# Patient Record
Sex: Female | Born: 1965 | Race: Black or African American | Hispanic: No | Smoking: Former smoker
Health system: Southern US, Community
[De-identification: ages and names within clinical notes are randomized; demographics above are authoritative.]

## PROBLEM LIST (undated history)

## (undated) DIAGNOSIS — A539 Syphilis, unspecified: Secondary | ICD-10-CM

## (undated) DIAGNOSIS — K219 Gastro-esophageal reflux disease without esophagitis: Secondary | ICD-10-CM

## (undated) DIAGNOSIS — E785 Hyperlipidemia, unspecified: Secondary | ICD-10-CM

## (undated) DIAGNOSIS — F419 Anxiety disorder, unspecified: Secondary | ICD-10-CM

## (undated) DIAGNOSIS — F32A Depression, unspecified: Secondary | ICD-10-CM

## (undated) DIAGNOSIS — J449 Chronic obstructive pulmonary disease, unspecified: Secondary | ICD-10-CM

## (undated) DIAGNOSIS — K635 Polyp of colon: Secondary | ICD-10-CM

## (undated) DIAGNOSIS — L0390 Cellulitis, unspecified: Secondary | ICD-10-CM

## (undated) DIAGNOSIS — I890 Lymphedema, not elsewhere classified: Secondary | ICD-10-CM

## (undated) DIAGNOSIS — M722 Plantar fascial fibromatosis: Secondary | ICD-10-CM

## (undated) DIAGNOSIS — D259 Leiomyoma of uterus, unspecified: Secondary | ICD-10-CM

## (undated) DIAGNOSIS — N6029 Fibroadenosis of unspecified breast: Secondary | ICD-10-CM

## (undated) DIAGNOSIS — T7840XA Allergy, unspecified, initial encounter: Secondary | ICD-10-CM

## (undated) DIAGNOSIS — F329 Major depressive disorder, single episode, unspecified: Secondary | ICD-10-CM

## (undated) DIAGNOSIS — R011 Cardiac murmur, unspecified: Secondary | ICD-10-CM

## (undated) DIAGNOSIS — M1990 Unspecified osteoarthritis, unspecified site: Secondary | ICD-10-CM

## (undated) DIAGNOSIS — T4145XA Adverse effect of unspecified anesthetic, initial encounter: Secondary | ICD-10-CM

## (undated) DIAGNOSIS — T8859XA Other complications of anesthesia, initial encounter: Secondary | ICD-10-CM

## (undated) DIAGNOSIS — E039 Hypothyroidism, unspecified: Secondary | ICD-10-CM

## (undated) DIAGNOSIS — I8390 Asymptomatic varicose veins of unspecified lower extremity: Secondary | ICD-10-CM

## (undated) HISTORY — PX: EYE SURGERY: SHX253

## (undated) HISTORY — DX: Lymphedema, not elsewhere classified: I89.0

## (undated) HISTORY — DX: Fibroadenosis of unspecified breast: N60.29

## (undated) HISTORY — DX: Polyp of colon: K63.5

## (undated) HISTORY — DX: Major depressive disorder, single episode, unspecified: F32.9

## (undated) HISTORY — DX: Gastro-esophageal reflux disease without esophagitis: K21.9

## (undated) HISTORY — DX: Syphilis, unspecified: A53.9

## (undated) HISTORY — DX: Leiomyoma of uterus, unspecified: D25.9

## (undated) HISTORY — PX: ENDOMETRIAL ABLATION: SHX621

## (undated) HISTORY — PX: WISDOM TOOTH EXTRACTION: SHX21

## (undated) HISTORY — DX: Asymptomatic varicose veins of unspecified lower extremity: I83.90

## (undated) HISTORY — PX: INCISION AND DRAINAGE ABSCESS / HEMATOMA OF BURSA / KNEE / THIGH: SUR668

## (undated) HISTORY — DX: Allergy, unspecified, initial encounter: T78.40XA

## (undated) HISTORY — DX: Depression, unspecified: F32.A

## (undated) HISTORY — PX: TUBAL LIGATION: SHX77

## (undated) HISTORY — PX: OTHER SURGICAL HISTORY: SHX169

## (undated) HISTORY — PX: NORPLANT REMOVAL: SHX5385

---

## 1997-12-29 ENCOUNTER — Inpatient Hospital Stay (HOSPITAL_COMMUNITY): Admission: EM | Admit: 1997-12-29 | Discharge: 1998-01-01 | Payer: Self-pay | Admitting: Emergency Medicine

## 1997-12-30 ENCOUNTER — Encounter: Payer: Self-pay | Admitting: Emergency Medicine

## 1997-12-31 ENCOUNTER — Encounter: Payer: Self-pay | Admitting: Family Medicine

## 1998-01-02 ENCOUNTER — Encounter: Admission: RE | Admit: 1998-01-02 | Discharge: 1998-01-02 | Payer: Self-pay | Admitting: Family Medicine

## 1998-01-08 ENCOUNTER — Encounter: Admission: RE | Admit: 1998-01-08 | Discharge: 1998-01-08 | Payer: Self-pay | Admitting: Family Medicine

## 1998-01-16 ENCOUNTER — Encounter: Admission: RE | Admit: 1998-01-16 | Discharge: 1998-01-16 | Payer: Self-pay | Admitting: Family Medicine

## 1998-01-24 ENCOUNTER — Encounter: Admission: RE | Admit: 1998-01-24 | Discharge: 1998-01-24 | Payer: Self-pay | Admitting: Family Medicine

## 1998-02-13 ENCOUNTER — Encounter: Admission: RE | Admit: 1998-02-13 | Discharge: 1998-02-13 | Payer: Self-pay | Admitting: Family Medicine

## 1998-02-21 ENCOUNTER — Encounter: Admission: RE | Admit: 1998-02-21 | Discharge: 1998-02-21 | Payer: Self-pay | Admitting: Family Medicine

## 1998-03-06 ENCOUNTER — Encounter: Admission: RE | Admit: 1998-03-06 | Discharge: 1998-03-06 | Payer: Self-pay | Admitting: Family Medicine

## 1998-03-20 ENCOUNTER — Encounter: Admission: RE | Admit: 1998-03-20 | Discharge: 1998-03-20 | Payer: Self-pay | Admitting: Family Medicine

## 1998-05-26 ENCOUNTER — Encounter: Admission: RE | Admit: 1998-05-26 | Discharge: 1998-05-26 | Payer: Self-pay | Admitting: Family Medicine

## 1998-08-05 ENCOUNTER — Encounter: Payer: Self-pay | Admitting: *Deleted

## 1998-08-05 ENCOUNTER — Encounter: Admission: RE | Admit: 1998-08-05 | Discharge: 1998-08-05 | Payer: Self-pay | Admitting: Family Medicine

## 1998-08-05 ENCOUNTER — Ambulatory Visit (HOSPITAL_COMMUNITY): Admission: RE | Admit: 1998-08-05 | Discharge: 1998-08-05 | Payer: Self-pay | Admitting: *Deleted

## 1998-08-07 ENCOUNTER — Encounter: Admission: RE | Admit: 1998-08-07 | Discharge: 1998-08-07 | Payer: Self-pay | Admitting: Family Medicine

## 1998-08-14 ENCOUNTER — Encounter: Admission: RE | Admit: 1998-08-14 | Discharge: 1998-08-14 | Payer: Self-pay | Admitting: Family Medicine

## 1998-09-04 ENCOUNTER — Encounter: Admission: RE | Admit: 1998-09-04 | Discharge: 1998-09-04 | Payer: Self-pay | Admitting: Sports Medicine

## 1998-09-07 ENCOUNTER — Encounter: Payer: Self-pay | Admitting: Emergency Medicine

## 1998-09-07 ENCOUNTER — Inpatient Hospital Stay (HOSPITAL_COMMUNITY): Admission: EM | Admit: 1998-09-07 | Discharge: 1998-09-13 | Payer: Self-pay | Admitting: Emergency Medicine

## 1998-09-09 ENCOUNTER — Encounter: Payer: Self-pay | Admitting: Family Medicine

## 1998-09-23 ENCOUNTER — Encounter: Admission: RE | Admit: 1998-09-23 | Discharge: 1998-09-23 | Payer: Self-pay | Admitting: Family Medicine

## 1998-09-23 ENCOUNTER — Inpatient Hospital Stay (HOSPITAL_COMMUNITY): Admission: AD | Admit: 1998-09-23 | Discharge: 1998-09-30 | Payer: Self-pay | Admitting: Family Medicine

## 1998-10-08 ENCOUNTER — Encounter: Admission: RE | Admit: 1998-10-08 | Discharge: 1998-10-08 | Payer: Self-pay | Admitting: Family Medicine

## 1998-12-23 ENCOUNTER — Encounter: Admission: RE | Admit: 1998-12-23 | Discharge: 1998-12-23 | Payer: Self-pay | Admitting: Family Medicine

## 1999-01-07 ENCOUNTER — Other Ambulatory Visit: Admission: RE | Admit: 1999-01-07 | Discharge: 1999-01-07 | Payer: Self-pay | Admitting: *Deleted

## 1999-01-07 ENCOUNTER — Encounter: Admission: RE | Admit: 1999-01-07 | Discharge: 1999-01-07 | Payer: Self-pay | Admitting: Family Medicine

## 1999-01-20 ENCOUNTER — Encounter: Payer: Self-pay | Admitting: *Deleted

## 1999-01-20 ENCOUNTER — Encounter: Admission: RE | Admit: 1999-01-20 | Discharge: 1999-01-20 | Payer: Self-pay | Admitting: *Deleted

## 1999-01-23 ENCOUNTER — Encounter: Admission: RE | Admit: 1999-01-23 | Discharge: 1999-01-23 | Payer: Self-pay | Admitting: Family Medicine

## 1999-01-26 ENCOUNTER — Encounter: Admission: RE | Admit: 1999-01-26 | Discharge: 1999-01-26 | Payer: Self-pay | Admitting: Family Medicine

## 1999-02-11 ENCOUNTER — Encounter: Admission: RE | Admit: 1999-02-11 | Discharge: 1999-02-11 | Payer: Self-pay | Admitting: Family Medicine

## 1999-02-23 ENCOUNTER — Encounter: Admission: RE | Admit: 1999-02-23 | Discharge: 1999-02-23 | Payer: Self-pay | Admitting: Sports Medicine

## 1999-02-24 ENCOUNTER — Encounter: Admission: RE | Admit: 1999-02-24 | Discharge: 1999-02-24 | Payer: Self-pay | Admitting: Sports Medicine

## 1999-02-26 ENCOUNTER — Ambulatory Visit (HOSPITAL_COMMUNITY): Admission: RE | Admit: 1999-02-26 | Discharge: 1999-02-26 | Payer: Self-pay | Admitting: *Deleted

## 1999-03-24 ENCOUNTER — Encounter: Admission: RE | Admit: 1999-03-24 | Discharge: 1999-03-24 | Payer: Self-pay | Admitting: Sports Medicine

## 1999-03-24 ENCOUNTER — Encounter: Payer: Self-pay | Admitting: Sports Medicine

## 1999-03-25 ENCOUNTER — Ambulatory Visit: Admission: RE | Admit: 1999-03-25 | Discharge: 1999-03-25 | Payer: Self-pay | Admitting: Sports Medicine

## 1999-03-27 ENCOUNTER — Encounter: Admission: RE | Admit: 1999-03-27 | Discharge: 1999-03-27 | Payer: Self-pay | Admitting: Family Medicine

## 1999-03-31 ENCOUNTER — Encounter: Admission: RE | Admit: 1999-03-31 | Discharge: 1999-03-31 | Payer: Self-pay | Admitting: Sports Medicine

## 1999-04-16 ENCOUNTER — Ambulatory Visit (HOSPITAL_COMMUNITY): Admission: RE | Admit: 1999-04-16 | Discharge: 1999-04-16 | Payer: Self-pay | Admitting: *Deleted

## 1999-04-17 ENCOUNTER — Encounter: Admission: RE | Admit: 1999-04-17 | Discharge: 1999-04-17 | Payer: Self-pay | Admitting: Family Medicine

## 1999-06-30 ENCOUNTER — Encounter: Admission: RE | Admit: 1999-06-30 | Discharge: 1999-06-30 | Payer: Self-pay | Admitting: Sports Medicine

## 1999-10-01 ENCOUNTER — Encounter: Admission: RE | Admit: 1999-10-01 | Discharge: 1999-10-01 | Payer: Self-pay | Admitting: Family Medicine

## 1999-10-06 ENCOUNTER — Encounter: Admission: RE | Admit: 1999-10-06 | Discharge: 1999-10-06 | Payer: Self-pay | Admitting: Family Medicine

## 1999-12-24 ENCOUNTER — Encounter: Admission: RE | Admit: 1999-12-24 | Discharge: 1999-12-24 | Payer: Self-pay | Admitting: Family Medicine

## 1999-12-29 ENCOUNTER — Encounter: Admission: RE | Admit: 1999-12-29 | Discharge: 1999-12-29 | Payer: Self-pay | Admitting: Family Medicine

## 1999-12-30 ENCOUNTER — Encounter: Admission: RE | Admit: 1999-12-30 | Discharge: 1999-12-30 | Payer: Self-pay | Admitting: Family Medicine

## 2000-01-07 ENCOUNTER — Encounter: Admission: RE | Admit: 2000-01-07 | Discharge: 2000-01-07 | Payer: Self-pay | Admitting: Sports Medicine

## 2000-02-12 ENCOUNTER — Other Ambulatory Visit: Admission: RE | Admit: 2000-02-12 | Discharge: 2000-02-12 | Payer: Self-pay | Admitting: Obstetrics & Gynecology

## 2000-02-12 ENCOUNTER — Encounter: Admission: RE | Admit: 2000-02-12 | Discharge: 2000-02-12 | Payer: Self-pay | Admitting: Family Medicine

## 2000-04-28 ENCOUNTER — Encounter: Admission: RE | Admit: 2000-04-28 | Discharge: 2000-04-28 | Payer: Self-pay | Admitting: Family Medicine

## 2000-05-02 ENCOUNTER — Encounter: Admission: RE | Admit: 2000-05-02 | Discharge: 2000-05-02 | Payer: Self-pay | Admitting: Family Medicine

## 2000-05-10 ENCOUNTER — Inpatient Hospital Stay (HOSPITAL_COMMUNITY): Admission: EM | Admit: 2000-05-10 | Discharge: 2000-05-12 | Payer: Self-pay | Admitting: *Deleted

## 2000-06-20 ENCOUNTER — Encounter: Admission: RE | Admit: 2000-06-20 | Discharge: 2000-06-20 | Payer: Self-pay | Admitting: Family Medicine

## 2000-07-20 ENCOUNTER — Encounter: Admission: RE | Admit: 2000-07-20 | Discharge: 2000-07-20 | Payer: Self-pay | Admitting: Family Medicine

## 2000-07-20 ENCOUNTER — Other Ambulatory Visit: Admission: RE | Admit: 2000-07-20 | Discharge: 2000-07-20 | Payer: Self-pay | Admitting: *Deleted

## 2000-07-22 ENCOUNTER — Encounter: Admission: RE | Admit: 2000-07-22 | Discharge: 2000-07-22 | Payer: Self-pay | Admitting: Obstetrics

## 2000-07-22 ENCOUNTER — Encounter: Admission: RE | Admit: 2000-07-22 | Discharge: 2000-07-22 | Payer: Self-pay | Admitting: Family Medicine

## 2000-07-27 ENCOUNTER — Encounter: Admission: RE | Admit: 2000-07-27 | Discharge: 2000-07-27 | Payer: Self-pay | Admitting: Family Medicine

## 2000-08-06 ENCOUNTER — Emergency Department (HOSPITAL_COMMUNITY): Admission: EM | Admit: 2000-08-06 | Discharge: 2000-08-06 | Payer: Self-pay | Admitting: Emergency Medicine

## 2000-08-09 ENCOUNTER — Encounter: Admission: RE | Admit: 2000-08-09 | Discharge: 2000-08-09 | Payer: Self-pay | Admitting: Family Medicine

## 2000-08-16 ENCOUNTER — Encounter: Admission: RE | Admit: 2000-08-16 | Discharge: 2000-08-16 | Payer: Self-pay | Admitting: Family Medicine

## 2000-08-19 ENCOUNTER — Encounter: Admission: RE | Admit: 2000-08-19 | Discharge: 2000-08-19 | Payer: Self-pay | Admitting: Family Medicine

## 2000-08-30 ENCOUNTER — Encounter: Admission: RE | Admit: 2000-08-30 | Discharge: 2000-08-30 | Payer: Self-pay | Admitting: Sports Medicine

## 2000-12-06 ENCOUNTER — Encounter: Admission: RE | Admit: 2000-12-06 | Discharge: 2000-12-06 | Payer: Self-pay | Admitting: Family Medicine

## 2001-05-15 ENCOUNTER — Encounter: Admission: RE | Admit: 2001-05-15 | Discharge: 2001-05-15 | Payer: Self-pay | Admitting: Family Medicine

## 2001-05-17 ENCOUNTER — Inpatient Hospital Stay (HOSPITAL_COMMUNITY): Admission: AD | Admit: 2001-05-17 | Discharge: 2001-05-19 | Payer: Self-pay | Admitting: Family Medicine

## 2001-05-26 ENCOUNTER — Encounter: Admission: RE | Admit: 2001-05-26 | Discharge: 2001-05-26 | Payer: Self-pay | Admitting: Family Medicine

## 2001-05-29 ENCOUNTER — Encounter: Admission: RE | Admit: 2001-05-29 | Discharge: 2001-05-29 | Payer: Self-pay | Admitting: Sports Medicine

## 2001-08-22 ENCOUNTER — Encounter: Admission: RE | Admit: 2001-08-22 | Discharge: 2001-08-22 | Payer: Self-pay | Admitting: Family Medicine

## 2001-08-24 ENCOUNTER — Encounter: Payer: Self-pay | Admitting: Sports Medicine

## 2001-08-24 ENCOUNTER — Encounter: Admission: RE | Admit: 2001-08-24 | Discharge: 2001-08-24 | Payer: Self-pay | Admitting: Sports Medicine

## 2001-09-05 ENCOUNTER — Other Ambulatory Visit: Admission: RE | Admit: 2001-09-05 | Discharge: 2001-09-05 | Payer: Self-pay | Admitting: Family Medicine

## 2001-09-11 ENCOUNTER — Encounter: Admission: RE | Admit: 2001-09-11 | Discharge: 2001-09-11 | Payer: Self-pay | Admitting: Sports Medicine

## 2001-09-11 ENCOUNTER — Encounter: Payer: Self-pay | Admitting: Sports Medicine

## 2001-09-14 ENCOUNTER — Encounter: Admission: RE | Admit: 2001-09-14 | Discharge: 2001-10-12 | Payer: Self-pay | Admitting: Infectious Diseases

## 2001-09-29 ENCOUNTER — Encounter: Admission: RE | Admit: 2001-09-29 | Discharge: 2001-09-29 | Payer: Self-pay | Admitting: Family Medicine

## 2001-10-20 ENCOUNTER — Encounter: Admission: RE | Admit: 2001-10-20 | Discharge: 2001-10-20 | Payer: Self-pay | Admitting: Family Medicine

## 2001-11-23 ENCOUNTER — Encounter: Admission: RE | Admit: 2001-11-23 | Discharge: 2001-11-23 | Payer: Self-pay | Admitting: Family Medicine

## 2001-12-01 ENCOUNTER — Encounter: Admission: RE | Admit: 2001-12-01 | Discharge: 2001-12-01 | Payer: Self-pay | Admitting: Family Medicine

## 2001-12-07 ENCOUNTER — Encounter: Admission: RE | Admit: 2001-12-07 | Discharge: 2001-12-07 | Payer: Self-pay | Admitting: *Deleted

## 2001-12-07 ENCOUNTER — Encounter: Payer: Self-pay | Admitting: *Deleted

## 2001-12-12 ENCOUNTER — Encounter: Admission: RE | Admit: 2001-12-12 | Discharge: 2001-12-12 | Payer: Self-pay | Admitting: Family Medicine

## 2001-12-14 ENCOUNTER — Encounter: Admission: RE | Admit: 2001-12-14 | Discharge: 2001-12-14 | Payer: Self-pay | Admitting: Obstetrics and Gynecology

## 2001-12-18 ENCOUNTER — Encounter: Payer: Self-pay | Admitting: *Deleted

## 2001-12-18 ENCOUNTER — Ambulatory Visit (HOSPITAL_COMMUNITY): Admission: RE | Admit: 2001-12-18 | Discharge: 2001-12-18 | Payer: Self-pay | Admitting: *Deleted

## 2001-12-25 ENCOUNTER — Ambulatory Visit (HOSPITAL_BASED_OUTPATIENT_CLINIC_OR_DEPARTMENT_OTHER): Admission: RE | Admit: 2001-12-25 | Discharge: 2001-12-25 | Payer: Self-pay | Admitting: Surgery

## 2002-01-22 ENCOUNTER — Ambulatory Visit (HOSPITAL_COMMUNITY): Admission: RE | Admit: 2002-01-22 | Discharge: 2002-01-22 | Payer: Self-pay | Admitting: Obstetrics and Gynecology

## 2002-03-08 HISTORY — PX: THYROID SURGERY: SHX805

## 2002-04-19 ENCOUNTER — Encounter: Admission: RE | Admit: 2002-04-19 | Discharge: 2002-04-19 | Payer: Self-pay | Admitting: Family Medicine

## 2002-04-20 ENCOUNTER — Encounter: Admission: RE | Admit: 2002-04-20 | Discharge: 2002-04-20 | Payer: Self-pay | Admitting: Family Medicine

## 2002-05-01 ENCOUNTER — Encounter: Admission: RE | Admit: 2002-05-01 | Discharge: 2002-05-01 | Payer: Self-pay | Admitting: Family Medicine

## 2002-05-18 ENCOUNTER — Encounter: Admission: RE | Admit: 2002-05-18 | Discharge: 2002-05-18 | Payer: Self-pay | Admitting: Family Medicine

## 2002-05-31 ENCOUNTER — Encounter: Admission: RE | Admit: 2002-05-31 | Discharge: 2002-05-31 | Payer: Self-pay | Admitting: Family Medicine

## 2002-06-29 ENCOUNTER — Encounter: Admission: RE | Admit: 2002-06-29 | Discharge: 2002-06-29 | Payer: Self-pay | Admitting: Family Medicine

## 2002-10-12 ENCOUNTER — Encounter: Admission: RE | Admit: 2002-10-12 | Discharge: 2002-10-12 | Payer: Self-pay | Admitting: Family Medicine

## 2003-04-27 ENCOUNTER — Emergency Department (HOSPITAL_COMMUNITY): Admission: EM | Admit: 2003-04-27 | Discharge: 2003-04-27 | Payer: Self-pay | Admitting: Emergency Medicine

## 2003-04-30 ENCOUNTER — Encounter: Admission: RE | Admit: 2003-04-30 | Discharge: 2003-04-30 | Payer: Self-pay | Admitting: Family Medicine

## 2003-05-03 ENCOUNTER — Encounter: Admission: RE | Admit: 2003-05-03 | Discharge: 2003-05-03 | Payer: Self-pay | Admitting: Family Medicine

## 2003-05-13 ENCOUNTER — Encounter: Admission: RE | Admit: 2003-05-13 | Discharge: 2003-05-13 | Payer: Self-pay | Admitting: Sports Medicine

## 2003-05-13 ENCOUNTER — Encounter: Admission: RE | Admit: 2003-05-13 | Discharge: 2003-05-13 | Payer: Self-pay | Admitting: Family Medicine

## 2003-06-06 ENCOUNTER — Encounter: Admission: RE | Admit: 2003-06-06 | Discharge: 2003-06-06 | Payer: Self-pay | Admitting: Sports Medicine

## 2003-06-14 ENCOUNTER — Encounter: Admission: RE | Admit: 2003-06-14 | Discharge: 2003-06-14 | Payer: Self-pay | Admitting: Sports Medicine

## 2003-06-23 ENCOUNTER — Ambulatory Visit (HOSPITAL_COMMUNITY): Admission: RE | Admit: 2003-06-23 | Discharge: 2003-06-23 | Payer: Self-pay | Admitting: Sports Medicine

## 2003-09-20 ENCOUNTER — Encounter: Admission: RE | Admit: 2003-09-20 | Discharge: 2003-09-20 | Payer: Self-pay | Admitting: Family Medicine

## 2003-12-20 ENCOUNTER — Ambulatory Visit: Payer: Self-pay | Admitting: Family Medicine

## 2003-12-20 ENCOUNTER — Other Ambulatory Visit: Admission: RE | Admit: 2003-12-20 | Discharge: 2003-12-20 | Payer: Self-pay | Admitting: Family Medicine

## 2004-01-08 ENCOUNTER — Encounter: Admission: RE | Admit: 2004-01-08 | Discharge: 2004-01-08 | Payer: Self-pay | Admitting: Sports Medicine

## 2004-01-23 ENCOUNTER — Encounter: Admission: RE | Admit: 2004-01-23 | Discharge: 2004-01-23 | Payer: Self-pay | Admitting: Sports Medicine

## 2004-05-25 ENCOUNTER — Ambulatory Visit: Payer: Self-pay | Admitting: Family Medicine

## 2004-06-22 ENCOUNTER — Ambulatory Visit: Payer: Self-pay | Admitting: Family Medicine

## 2004-06-25 ENCOUNTER — Ambulatory Visit: Payer: Self-pay | Admitting: Family Medicine

## 2004-07-02 ENCOUNTER — Ambulatory Visit: Payer: Self-pay | Admitting: Sports Medicine

## 2004-07-06 ENCOUNTER — Ambulatory Visit: Payer: Self-pay | Admitting: Family Medicine

## 2004-07-07 ENCOUNTER — Encounter (INDEPENDENT_AMBULATORY_CARE_PROVIDER_SITE_OTHER): Payer: Self-pay | Admitting: Specialist

## 2004-07-07 ENCOUNTER — Ambulatory Visit: Payer: Self-pay | Admitting: Obstetrics and Gynecology

## 2004-07-08 ENCOUNTER — Ambulatory Visit: Payer: Self-pay | Admitting: Family Medicine

## 2004-07-09 ENCOUNTER — Observation Stay (HOSPITAL_COMMUNITY): Admission: EM | Admit: 2004-07-09 | Discharge: 2004-07-10 | Payer: Self-pay | Admitting: Emergency Medicine

## 2004-07-09 ENCOUNTER — Ambulatory Visit: Payer: Self-pay | Admitting: Family Medicine

## 2004-08-26 ENCOUNTER — Ambulatory Visit: Payer: Self-pay | Admitting: Family Medicine

## 2004-09-28 ENCOUNTER — Ambulatory Visit: Payer: Self-pay | Admitting: Family Medicine

## 2004-11-05 ENCOUNTER — Ambulatory Visit: Payer: Self-pay | Admitting: Family Medicine

## 2004-11-11 ENCOUNTER — Ambulatory Visit (HOSPITAL_COMMUNITY): Admission: RE | Admit: 2004-11-11 | Discharge: 2004-11-11 | Payer: Self-pay | Admitting: Sports Medicine

## 2005-02-09 ENCOUNTER — Emergency Department (HOSPITAL_COMMUNITY): Admission: EM | Admit: 2005-02-09 | Discharge: 2005-02-09 | Payer: Self-pay | Admitting: Emergency Medicine

## 2005-02-10 ENCOUNTER — Ambulatory Visit: Payer: Self-pay | Admitting: Family Medicine

## 2005-05-03 ENCOUNTER — Ambulatory Visit: Payer: Self-pay | Admitting: Family Medicine

## 2005-06-03 ENCOUNTER — Ambulatory Visit: Payer: Self-pay | Admitting: Family Medicine

## 2005-06-06 ENCOUNTER — Encounter (INDEPENDENT_AMBULATORY_CARE_PROVIDER_SITE_OTHER): Payer: Self-pay | Admitting: *Deleted

## 2006-02-16 ENCOUNTER — Ambulatory Visit: Payer: Self-pay | Admitting: Sports Medicine

## 2006-05-05 DIAGNOSIS — Z87891 Personal history of nicotine dependence: Secondary | ICD-10-CM | POA: Insufficient documentation

## 2006-05-05 DIAGNOSIS — E039 Hypothyroidism, unspecified: Secondary | ICD-10-CM | POA: Insufficient documentation

## 2006-05-05 DIAGNOSIS — F39 Unspecified mood [affective] disorder: Secondary | ICD-10-CM

## 2006-05-05 DIAGNOSIS — G43909 Migraine, unspecified, not intractable, without status migrainosus: Secondary | ICD-10-CM | POA: Insufficient documentation

## 2006-05-05 DIAGNOSIS — N6029 Fibroadenosis of unspecified breast: Secondary | ICD-10-CM | POA: Insufficient documentation

## 2006-05-05 DIAGNOSIS — Z720 Tobacco use: Secondary | ICD-10-CM | POA: Insufficient documentation

## 2006-05-05 DIAGNOSIS — M5126 Other intervertebral disc displacement, lumbar region: Secondary | ICD-10-CM

## 2006-05-06 ENCOUNTER — Encounter (INDEPENDENT_AMBULATORY_CARE_PROVIDER_SITE_OTHER): Payer: Self-pay | Admitting: *Deleted

## 2006-07-11 ENCOUNTER — Emergency Department (HOSPITAL_COMMUNITY): Admission: EM | Admit: 2006-07-11 | Discharge: 2006-07-11 | Payer: Self-pay | Admitting: Emergency Medicine

## 2006-07-13 ENCOUNTER — Ambulatory Visit: Payer: Self-pay | Admitting: Family Medicine

## 2006-07-13 ENCOUNTER — Encounter: Payer: Self-pay | Admitting: Family Medicine

## 2006-07-13 ENCOUNTER — Inpatient Hospital Stay (HOSPITAL_COMMUNITY): Admission: EM | Admit: 2006-07-13 | Discharge: 2006-07-18 | Payer: Self-pay | Admitting: Emergency Medicine

## 2006-08-11 ENCOUNTER — Encounter (INDEPENDENT_AMBULATORY_CARE_PROVIDER_SITE_OTHER): Payer: Self-pay | Admitting: Family Medicine

## 2006-08-11 ENCOUNTER — Encounter: Payer: Self-pay | Admitting: *Deleted

## 2006-08-11 ENCOUNTER — Ambulatory Visit: Payer: Self-pay | Admitting: Family Medicine

## 2006-08-17 ENCOUNTER — Ambulatory Visit: Payer: Self-pay | Admitting: Family Medicine

## 2006-08-17 DIAGNOSIS — L732 Hidradenitis suppurativa: Secondary | ICD-10-CM

## 2006-11-18 ENCOUNTER — Emergency Department (HOSPITAL_COMMUNITY): Admission: EM | Admit: 2006-11-18 | Discharge: 2006-11-18 | Payer: Self-pay | Admitting: Family Medicine

## 2006-11-18 ENCOUNTER — Telehealth: Payer: Self-pay | Admitting: *Deleted

## 2006-12-12 ENCOUNTER — Telehealth: Payer: Self-pay | Admitting: *Deleted

## 2007-01-04 ENCOUNTER — Encounter: Payer: Self-pay | Admitting: Family Medicine

## 2007-01-04 ENCOUNTER — Ambulatory Visit: Payer: Self-pay | Admitting: Family Medicine

## 2007-01-04 DIAGNOSIS — I890 Lymphedema, not elsewhere classified: Secondary | ICD-10-CM | POA: Insufficient documentation

## 2007-01-04 LAB — CONVERTED CEMR LAB
BUN: 16 mg/dL (ref 6–23)
Calcium: 9.5 mg/dL (ref 8.4–10.5)
Glucose, Bld: 79 mg/dL (ref 70–99)
TSH: 54.509 microintl units/mL — ABNORMAL HIGH (ref 0.350–5.50)

## 2007-01-06 ENCOUNTER — Telehealth: Payer: Self-pay | Admitting: Family Medicine

## 2007-01-10 ENCOUNTER — Encounter: Payer: Self-pay | Admitting: Family Medicine

## 2007-01-10 ENCOUNTER — Ambulatory Visit: Payer: Self-pay | Admitting: Family Medicine

## 2007-01-12 ENCOUNTER — Encounter: Payer: Self-pay | Admitting: Family Medicine

## 2007-01-12 LAB — CONVERTED CEMR LAB
HDL: 77 mg/dL (ref >39)
LDL Cholesterol: 147 mg/dL — ABNORMAL HIGH (ref 0–99)

## 2007-01-24 ENCOUNTER — Encounter: Payer: Self-pay | Admitting: Family Medicine

## 2007-01-27 ENCOUNTER — Encounter (INDEPENDENT_AMBULATORY_CARE_PROVIDER_SITE_OTHER): Payer: Self-pay | Admitting: *Deleted

## 2007-02-07 ENCOUNTER — Telehealth: Payer: Self-pay | Admitting: *Deleted

## 2007-02-08 ENCOUNTER — Telehealth: Payer: Self-pay | Admitting: *Deleted

## 2007-02-28 ENCOUNTER — Telehealth: Payer: Self-pay | Admitting: *Deleted

## 2007-03-29 ENCOUNTER — Telehealth: Payer: Self-pay | Admitting: Family Medicine

## 2007-04-19 ENCOUNTER — Ambulatory Visit: Payer: Self-pay | Admitting: Family Medicine

## 2007-04-19 ENCOUNTER — Telehealth: Payer: Self-pay | Admitting: *Deleted

## 2007-05-22 ENCOUNTER — Telehealth (INDEPENDENT_AMBULATORY_CARE_PROVIDER_SITE_OTHER): Payer: Self-pay | Admitting: *Deleted

## 2007-05-23 ENCOUNTER — Ambulatory Visit: Payer: Self-pay | Admitting: Family Medicine

## 2007-10-02 ENCOUNTER — Encounter (INDEPENDENT_AMBULATORY_CARE_PROVIDER_SITE_OTHER): Payer: Self-pay | Admitting: Family Medicine

## 2007-10-02 ENCOUNTER — Encounter: Payer: Self-pay | Admitting: Family Medicine

## 2007-10-02 ENCOUNTER — Ambulatory Visit: Payer: Self-pay | Admitting: Family Medicine

## 2007-10-03 LAB — CONVERTED CEMR LAB: Glucose, Bld: 85 mg/dL (ref 70–99)

## 2007-10-06 ENCOUNTER — Encounter: Payer: Self-pay | Admitting: Family Medicine

## 2007-11-06 ENCOUNTER — Encounter: Payer: Self-pay | Admitting: *Deleted

## 2007-11-06 ENCOUNTER — Telehealth: Payer: Self-pay | Admitting: *Deleted

## 2007-12-19 ENCOUNTER — Telehealth: Payer: Self-pay | Admitting: *Deleted

## 2007-12-19 ENCOUNTER — Telehealth (INDEPENDENT_AMBULATORY_CARE_PROVIDER_SITE_OTHER): Payer: Self-pay | Admitting: *Deleted

## 2007-12-19 ENCOUNTER — Emergency Department (HOSPITAL_COMMUNITY): Admission: EM | Admit: 2007-12-19 | Discharge: 2007-12-19 | Payer: Self-pay | Admitting: Emergency Medicine

## 2007-12-28 ENCOUNTER — Telehealth: Payer: Self-pay | Admitting: *Deleted

## 2008-01-05 ENCOUNTER — Ambulatory Visit: Payer: Self-pay | Admitting: Family Medicine

## 2008-01-05 ENCOUNTER — Encounter: Payer: Self-pay | Admitting: Family Medicine

## 2008-01-05 DIAGNOSIS — M2550 Pain in unspecified joint: Secondary | ICD-10-CM

## 2008-01-05 DIAGNOSIS — E785 Hyperlipidemia, unspecified: Secondary | ICD-10-CM

## 2008-01-16 LAB — CONVERTED CEMR LAB
BUN: 10 mg/dL (ref 6–23)
CO2: 19 meq/L (ref 19–32)
Calcium: 8.8 mg/dL (ref 8.4–10.5)
Chloride: 106 meq/L (ref 96–112)
Creatinine, Ser: 0.86 mg/dL (ref 0.40–1.20)
Glucose, Bld: 141 mg/dL — ABNORMAL HIGH (ref 70–99)

## 2008-01-31 ENCOUNTER — Encounter: Payer: Self-pay | Admitting: Family Medicine

## 2008-01-31 ENCOUNTER — Ambulatory Visit: Payer: Self-pay | Admitting: Family Medicine

## 2008-02-05 ENCOUNTER — Encounter: Payer: Self-pay | Admitting: Family Medicine

## 2008-02-05 LAB — CONVERTED CEMR LAB
Basophils Absolute: 0 10*3/uL (ref 0.0–0.1)
Cholesterol: 233 mg/dL — ABNORMAL HIGH (ref 0–200)
Eosinophils Absolute: 0.1 10*3/uL (ref 0.0–0.7)
Eosinophils Relative: 3 % (ref 0–5)
HCT: 38.6 % (ref 36.0–46.0)
Hemoglobin: 13.3 g/dL (ref 12.0–15.0)
MCV: 97 fL (ref 78.0–100.0)
Monocytes Absolute: 0.3 10*3/uL (ref 0.1–1.0)
Platelets: 298 10*3/uL (ref 150–400)
Pro B Natriuretic peptide (BNP): 11.8 pg/mL (ref 0.0–100.0)
RDW: 13.2 % (ref 11.5–15.5)
VLDL: 21 mg/dL (ref 0–40)

## 2008-04-02 ENCOUNTER — Telehealth: Payer: Self-pay | Admitting: *Deleted

## 2008-05-28 ENCOUNTER — Telehealth: Payer: Self-pay | Admitting: *Deleted

## 2008-06-12 ENCOUNTER — Encounter: Payer: Self-pay | Admitting: Family Medicine

## 2008-06-12 ENCOUNTER — Ambulatory Visit: Payer: Self-pay | Admitting: Family Medicine

## 2008-06-12 LAB — CONVERTED CEMR LAB
Bilirubin Urine: NEGATIVE
Blood in Urine, dipstick: NEGATIVE
Glucose, Urine, Semiquant: NEGATIVE
Hgb A1c MFr Bld: 5.4 %
Protein, U semiquant: NEGATIVE
Specific Gravity, Urine: 1.02
WBC Urine, dipstick: NEGATIVE
pH: 6

## 2008-06-14 ENCOUNTER — Encounter: Payer: Self-pay | Admitting: Family Medicine

## 2008-07-22 ENCOUNTER — Telehealth: Payer: Self-pay | Admitting: Family Medicine

## 2008-07-23 ENCOUNTER — Encounter: Payer: Self-pay | Admitting: Family Medicine

## 2008-07-23 ENCOUNTER — Ambulatory Visit: Payer: Self-pay | Admitting: Family Medicine

## 2008-07-23 LAB — CONVERTED CEMR LAB: Beta hcg, urine, semiquantitative: NEGATIVE

## 2008-07-24 ENCOUNTER — Encounter: Payer: Self-pay | Admitting: Family Medicine

## 2008-10-21 ENCOUNTER — Telehealth: Payer: Self-pay | Admitting: Family Medicine

## 2008-10-30 ENCOUNTER — Ambulatory Visit: Payer: Self-pay | Admitting: Family Medicine

## 2008-10-31 ENCOUNTER — Encounter: Payer: Self-pay | Admitting: *Deleted

## 2008-11-19 ENCOUNTER — Ambulatory Visit (HOSPITAL_COMMUNITY): Admission: RE | Admit: 2008-11-19 | Discharge: 2008-11-19 | Payer: Self-pay | Admitting: Family Medicine

## 2008-11-20 ENCOUNTER — Telehealth: Payer: Self-pay | Admitting: Family Medicine

## 2009-02-22 ENCOUNTER — Emergency Department (HOSPITAL_COMMUNITY): Admission: EM | Admit: 2009-02-22 | Discharge: 2009-02-22 | Payer: Self-pay | Admitting: Emergency Medicine

## 2009-02-22 ENCOUNTER — Emergency Department (HOSPITAL_COMMUNITY): Admission: EM | Admit: 2009-02-22 | Discharge: 2009-02-22 | Payer: Self-pay | Admitting: Family Medicine

## 2009-02-27 ENCOUNTER — Emergency Department (HOSPITAL_COMMUNITY): Admission: EM | Admit: 2009-02-27 | Discharge: 2009-02-27 | Payer: Self-pay | Admitting: Family Medicine

## 2009-02-27 ENCOUNTER — Emergency Department (HOSPITAL_COMMUNITY): Admission: EM | Admit: 2009-02-27 | Discharge: 2009-02-27 | Payer: Self-pay | Admitting: Emergency Medicine

## 2009-02-27 ENCOUNTER — Telehealth: Payer: Self-pay | Admitting: Family Medicine

## 2009-03-04 ENCOUNTER — Telehealth: Payer: Self-pay | Admitting: Family Medicine

## 2009-03-05 ENCOUNTER — Telehealth: Payer: Self-pay | Admitting: *Deleted

## 2009-03-11 ENCOUNTER — Telehealth (INDEPENDENT_AMBULATORY_CARE_PROVIDER_SITE_OTHER): Payer: Self-pay | Admitting: Family Medicine

## 2009-03-14 ENCOUNTER — Ambulatory Visit: Payer: Self-pay | Admitting: Family Medicine

## 2009-03-29 ENCOUNTER — Emergency Department (HOSPITAL_COMMUNITY): Admission: EM | Admit: 2009-03-29 | Discharge: 2009-03-29 | Payer: Self-pay | Admitting: Emergency Medicine

## 2009-03-29 ENCOUNTER — Telehealth: Payer: Self-pay | Admitting: Family Medicine

## 2009-05-08 ENCOUNTER — Other Ambulatory Visit: Admission: RE | Admit: 2009-05-08 | Discharge: 2009-05-08 | Payer: Self-pay | Admitting: Family Medicine

## 2009-05-09 ENCOUNTER — Ambulatory Visit: Payer: Self-pay | Admitting: Family Medicine

## 2009-05-09 ENCOUNTER — Encounter: Payer: Self-pay | Admitting: Family Medicine

## 2009-05-09 DIAGNOSIS — J449 Chronic obstructive pulmonary disease, unspecified: Secondary | ICD-10-CM | POA: Insufficient documentation

## 2009-05-09 LAB — CONVERTED CEMR LAB
Chlamydia, DNA Probe: NEGATIVE
TSH: 0.017 microintl units/mL — ABNORMAL LOW (ref 0.350–4.500)
Whiff Test: POSITIVE

## 2009-05-19 ENCOUNTER — Encounter: Payer: Self-pay | Admitting: Family Medicine

## 2009-05-20 ENCOUNTER — Telehealth: Payer: Self-pay | Admitting: Family Medicine

## 2009-05-21 ENCOUNTER — Telehealth (INDEPENDENT_AMBULATORY_CARE_PROVIDER_SITE_OTHER): Payer: Self-pay | Admitting: *Deleted

## 2009-05-28 ENCOUNTER — Encounter: Payer: Self-pay | Admitting: *Deleted

## 2009-07-22 ENCOUNTER — Encounter: Payer: Self-pay | Admitting: Family Medicine

## 2009-07-22 ENCOUNTER — Ambulatory Visit: Payer: Self-pay | Admitting: Family Medicine

## 2009-07-22 LAB — CONVERTED CEMR LAB
Blood in Urine, dipstick: NEGATIVE
Ketones, urine, test strip: NEGATIVE
Nitrite: NEGATIVE
Protein, U semiquant: NEGATIVE
Urobilinogen, UA: 0.2

## 2009-07-23 LAB — CONVERTED CEMR LAB
Albumin: 3.8 g/dL (ref 3.5–5.2)
Alkaline Phosphatase: 73 units/L (ref 39–117)
BUN: 11 mg/dL (ref 6–23)
Cholesterol: 264 mg/dL — ABNORMAL HIGH (ref 0–200)
Glucose, Bld: 90 mg/dL (ref 70–99)
HDL: 84 mg/dL (ref >39)
Hemoglobin: 13.8 g/dL (ref 12.0–15.0)
LDL Cholesterol: 158 mg/dL — ABNORMAL HIGH (ref 0–99)
MCHC: 33.5 g/dL (ref 30.0–36.0)
MCV: 98.1 fL (ref 78.0–100.0)
Potassium: 4.5 meq/L (ref 3.5–5.3)
RBC: 4.2 M/uL (ref 3.87–5.11)
Total Bilirubin: 0.4 mg/dL (ref 0.3–1.2)
Triglycerides: 111 mg/dL (ref <150)

## 2009-07-31 ENCOUNTER — Ambulatory Visit: Payer: Self-pay | Admitting: Sports Medicine

## 2009-07-31 ENCOUNTER — Encounter: Payer: Self-pay | Admitting: Family Medicine

## 2009-09-01 ENCOUNTER — Telehealth: Payer: Self-pay | Admitting: *Deleted

## 2009-09-04 ENCOUNTER — Ambulatory Visit: Payer: Self-pay | Admitting: Family Medicine

## 2009-09-04 ENCOUNTER — Encounter: Payer: Self-pay | Admitting: Family Medicine

## 2009-09-04 LAB — CONVERTED CEMR LAB
Bilirubin Urine: NEGATIVE
Epithelial cells, urine: >20 /lpf
Protein, U semiquant: 30
Urobilinogen, UA: 0.2

## 2009-09-06 LAB — CONVERTED CEMR LAB
Basophils Absolute: 0 10*3/uL (ref 0.0–0.1)
Basophils Relative: 1 % (ref 0–1)
Direct LDL: 130 mg/dL — ABNORMAL HIGH
Hemoglobin: 13.7 g/dL (ref 12.0–15.0)
Lymphocytes Relative: 44 % (ref 12–46)
MCHC: 33.9 g/dL (ref 30.0–36.0)
Neutro Abs: 1.6 10*3/uL — ABNORMAL LOW (ref 1.7–7.7)
Neutrophils Relative %: 45 % (ref 43–77)
Platelets: 310 10*3/uL (ref 150–400)
RDW: 13.4 % (ref 11.5–15.5)
TSH: 35.45 microintl units/mL — ABNORMAL HIGH (ref 0.350–4.500)

## 2009-09-10 ENCOUNTER — Telehealth: Payer: Self-pay | Admitting: Family Medicine

## 2009-09-16 ENCOUNTER — Telehealth (INDEPENDENT_AMBULATORY_CARE_PROVIDER_SITE_OTHER): Payer: Self-pay | Admitting: *Deleted

## 2009-09-29 ENCOUNTER — Encounter: Payer: Self-pay | Admitting: *Deleted

## 2009-10-02 ENCOUNTER — Encounter: Payer: Self-pay | Admitting: Family Medicine

## 2009-10-02 ENCOUNTER — Ambulatory Visit: Payer: Self-pay | Admitting: Family Medicine

## 2009-10-22 ENCOUNTER — Ambulatory Visit: Payer: Self-pay | Admitting: Family Medicine

## 2009-10-24 ENCOUNTER — Encounter: Payer: Self-pay | Admitting: Family Medicine

## 2009-10-24 ENCOUNTER — Ambulatory Visit: Payer: Self-pay | Admitting: Family Medicine

## 2009-10-24 DIAGNOSIS — F419 Anxiety disorder, unspecified: Secondary | ICD-10-CM

## 2009-10-24 DIAGNOSIS — F329 Major depressive disorder, single episode, unspecified: Secondary | ICD-10-CM

## 2009-10-27 ENCOUNTER — Telehealth: Payer: Self-pay | Admitting: *Deleted

## 2009-12-04 ENCOUNTER — Telehealth: Payer: Self-pay | Admitting: Family Medicine

## 2009-12-08 ENCOUNTER — Telehealth: Payer: Self-pay | Admitting: Family Medicine

## 2009-12-18 ENCOUNTER — Encounter: Payer: Self-pay | Admitting: Family Medicine

## 2009-12-18 ENCOUNTER — Ambulatory Visit: Payer: Self-pay | Admitting: Family Medicine

## 2009-12-18 DIAGNOSIS — H9209 Otalgia, unspecified ear: Secondary | ICD-10-CM | POA: Insufficient documentation

## 2009-12-18 LAB — CONVERTED CEMR LAB
ALT: 11 units/L (ref 0–35)
AST: 12 units/L (ref 0–37)
BUN: 12 mg/dL (ref 6–23)
Calcium: 8.8 mg/dL (ref 8.4–10.5)
Creatinine, Ser: 0.91 mg/dL (ref 0.40–1.20)
Total Bilirubin: 0.3 mg/dL (ref 0.3–1.2)

## 2009-12-22 ENCOUNTER — Telehealth: Payer: Self-pay | Admitting: Family Medicine

## 2010-01-23 ENCOUNTER — Encounter
Admission: RE | Admit: 2010-01-23 | Discharge: 2010-04-07 | Payer: Self-pay | Source: Home / Self Care | Attending: Physical Medicine and Rehabilitation | Admitting: Physical Medicine and Rehabilitation

## 2010-01-26 ENCOUNTER — Telehealth: Payer: Self-pay | Admitting: Family Medicine

## 2010-01-26 ENCOUNTER — Encounter: Payer: Self-pay | Admitting: Family Medicine

## 2010-01-27 ENCOUNTER — Telehealth: Payer: Self-pay | Admitting: *Deleted

## 2010-02-03 ENCOUNTER — Encounter: Payer: Self-pay | Admitting: Family Medicine

## 2010-02-09 ENCOUNTER — Inpatient Hospital Stay (HOSPITAL_COMMUNITY)
Admission: EM | Admit: 2010-02-09 | Discharge: 2010-02-15 | Payer: Self-pay | Source: Home / Self Care | Attending: Family Medicine | Admitting: Family Medicine

## 2010-02-09 ENCOUNTER — Telehealth: Payer: Self-pay | Admitting: Family Medicine

## 2010-02-09 ENCOUNTER — Telehealth (INDEPENDENT_AMBULATORY_CARE_PROVIDER_SITE_OTHER): Payer: Self-pay | Admitting: *Deleted

## 2010-02-09 ENCOUNTER — Emergency Department (HOSPITAL_COMMUNITY)
Admission: EM | Admit: 2010-02-09 | Discharge: 2010-02-09 | Disposition: A | Discharge status: Other Acute Inpatient Hospital | Payer: Self-pay | Source: Home / Self Care | Admitting: Family Medicine

## 2010-02-10 ENCOUNTER — Encounter: Payer: Self-pay | Admitting: Family Medicine

## 2010-02-11 ENCOUNTER — Telehealth: Payer: Self-pay | Admitting: Family Medicine

## 2010-02-17 ENCOUNTER — Telehealth: Payer: Self-pay | Admitting: Family Medicine

## 2010-02-18 ENCOUNTER — Emergency Department (HOSPITAL_COMMUNITY)
Admission: EM | Admit: 2010-02-18 | Discharge: 2010-02-18 | Payer: Self-pay | Source: Home / Self Care | Admitting: Emergency Medicine

## 2010-03-06 ENCOUNTER — Telehealth: Payer: Self-pay | Admitting: Family Medicine

## 2010-03-08 ENCOUNTER — Encounter: Payer: Self-pay | Admitting: Family Medicine

## 2010-03-10 ENCOUNTER — Telehealth: Payer: Self-pay | Admitting: Family Medicine

## 2010-03-10 ENCOUNTER — Ambulatory Visit: Admit: 2010-03-10 | Payer: Self-pay

## 2010-03-17 ENCOUNTER — Telehealth: Payer: Self-pay | Admitting: Family Medicine

## 2010-03-20 ENCOUNTER — Telehealth: Payer: Self-pay | Admitting: Family Medicine

## 2010-03-28 ENCOUNTER — Encounter: Payer: Self-pay | Admitting: Sports Medicine

## 2010-03-29 ENCOUNTER — Encounter: Payer: Self-pay | Admitting: *Deleted

## 2010-03-29 ENCOUNTER — Encounter: Payer: Self-pay | Admitting: Emergency Medicine

## 2010-04-05 DIAGNOSIS — A539 Syphilis, unspecified: Secondary | ICD-10-CM | POA: Insufficient documentation

## 2010-04-08 ENCOUNTER — Encounter: Payer: Self-pay | Admitting: Family Medicine

## 2010-04-08 ENCOUNTER — Other Ambulatory Visit: Payer: Self-pay

## 2010-04-08 ENCOUNTER — Inpatient Hospital Stay (INDEPENDENT_AMBULATORY_CARE_PROVIDER_SITE_OTHER): Payer: Medicaid Other | Admitting: Family Medicine

## 2010-04-08 ENCOUNTER — Ambulatory Visit: Admit: 2010-04-08 | Payer: Self-pay

## 2010-04-08 ENCOUNTER — Other Ambulatory Visit (HOSPITAL_COMMUNITY)
Admission: RE | Admit: 2010-04-08 | Discharge: 2010-04-08 | Disposition: A | Discharge status: Home / Self Care | Payer: Medicaid Other | Source: Ambulatory Visit | Attending: Family Medicine | Admitting: Family Medicine

## 2010-04-08 DIAGNOSIS — Z124 Encounter for screening for malignant neoplasm of cervix: Secondary | ICD-10-CM

## 2010-04-08 DIAGNOSIS — R21 Rash and other nonspecific skin eruption: Secondary | ICD-10-CM

## 2010-04-08 DIAGNOSIS — A539 Syphilis, unspecified: Secondary | ICD-10-CM

## 2010-04-08 DIAGNOSIS — M545 Low back pain, unspecified: Secondary | ICD-10-CM

## 2010-04-08 DIAGNOSIS — R300 Dysuria: Secondary | ICD-10-CM

## 2010-04-08 DIAGNOSIS — N760 Acute vaginitis: Secondary | ICD-10-CM

## 2010-04-08 DIAGNOSIS — F3289 Other specified depressive episodes: Secondary | ICD-10-CM

## 2010-04-08 DIAGNOSIS — N6029 Fibroadenosis of unspecified breast: Secondary | ICD-10-CM

## 2010-04-08 DIAGNOSIS — F329 Major depressive disorder, single episode, unspecified: Secondary | ICD-10-CM

## 2010-04-08 DIAGNOSIS — F17200 Nicotine dependence, unspecified, uncomplicated: Secondary | ICD-10-CM

## 2010-04-08 DIAGNOSIS — E039 Hypothyroidism, unspecified: Secondary | ICD-10-CM

## 2010-04-08 DIAGNOSIS — Z8742 Personal history of other diseases of the female genital tract: Secondary | ICD-10-CM

## 2010-04-08 DIAGNOSIS — N926 Irregular menstruation, unspecified: Secondary | ICD-10-CM

## 2010-04-08 LAB — CONVERTED CEMR LAB
Bilirubin Urine: NEGATIVE
Chlamydia, DNA Probe: NEGATIVE
Chlamydia, DNA Probe: NEGATIVE
Free T4: 1.1 ng/dL (ref 0.80–1.80)
GC Probe Amp, Genital: NEGATIVE
Ketones, urine, test strip: NEGATIVE
Nitrite: NEGATIVE
Protein, U semiquant: NEGATIVE
TSH: 2.675 microintl units/mL (ref 0.350–4.500)
Urobilinogen, UA: 0.2

## 2010-04-08 LAB — TSH: TSH: 2.675 u[IU]/mL (ref 0.350–4.500)
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
## 2010-04-09 ENCOUNTER — Telehealth: Payer: Self-pay | Admitting: Family Medicine

## 2010-04-09 LAB — GC/CHLAMYDIA PROBE AMP, GENITAL
Chlamydia, DNA Probe: NEGATIVE
GC Probe Amp, Genital: NEGATIVE
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
## 2010-04-09 NOTE — Miscellaneous (Signed)
Summary: walk in  Clinical Lists Changes states she needs a heating pad & something stronger than percocet. had just seen Dr. Pearletha Forge today. told her medicaid will not pay for a heating pad. cost is about $25 or could make one with rice in a  tube sock. heat in microwave 1 minute & place on sore spot. use less time if too hot. has not been taking her valium or percocet. states it does not work. explained she needs to take it more than once in a while.  urged her to take it. laying on L side is best for her. told her to go home & lay down with the meds & heating item. appt made for tomorow with work in at 8:30. Marland KitchenGolden Circle RN  Jul 31, 2009 10:55 AM

## 2010-04-09 NOTE — Progress Notes (Signed)
  Phone Note Call from Patient   Caller: Patient Summary of Call: Patient calls giving long, rambling history of months of back pain and ED visits and episodes of feeling hot in the face and feeling like she may faint.  States she thinks she's dehydrated because everytime she drinks something she urinates.  Attempted to direct patient to what her problem/question is today.  States yesterday and today having the feeling of almost fainting when she stands up (but has not fainted) and feeling hot in her face.  Urinates everytime she drinks something.  Not diabetic.  Very worried she is dehydrated.  Wants to know what is wrong with her.  Told her I cannot tell her exactly what is going on over the phone.  Suggested if she is very worried she go to [facility] or ED today.  She states [facility] goes to [facility] they send [facility] to the ED, therefore advised patient to just go to ED.  Initial call taken by: Lamar Laundry, MD January 22nd, 2011 11:20AM
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
## 2010-04-09 NOTE — Letter (Signed)
Summary: Suspension Letter  [facility]  [address]   [place], [place] [address]   Phone: [phone]  Fax: [phone]    09/29/2009  [name] [address] [place], [place]  [address]  Dear [name],  You have missed 4 scheduled appointments with our practice.If you cannot keep your appointment, we expect you to call and cancel at least 24 hours before your appointment time.  As per our policy, we will now only give you limited medical services. means we will not call in a refill for you, or complete a form or make a referral except when you are here for a scheduled office visit.   If you miss 2 more appointments in the next year, we will dismiss you from our practice.  We hope this does not happen.  If you keep your appointments for the next year you will be returned to regular patient status.  We hope these changes will encourage you to keep your appointments so we may provide you the best medical care.   Our office staff can be reached at [phone] Monday through Friday from 8:30 a.m.-5:00 p.m. and will be glad to schedule your appointment as necessary.     Sincerely,   [facility]    Appended Document: Suspension Letter mailed

## 2010-04-09 NOTE — Progress Notes (Signed)
Summary: Rx request oxycodone  Phone Note Refill Request Call back at [phone] Message from:  Patient  Refills Requested: Medication #1:  OXYCODONE HCL 5 MG TABS Take 1-2 tablets every 6 hrs for breakthrough pain.   Notes: needs 7.5 - pain is increasing also has another question for nurse  Next Appointment Scheduled: 12/18/09 Initial call taken by: De Nurse,  December 04, 2009 10:34 AM    Prescriptions: OXYCODONE HCL 5 MG TABS (OXYCODONE HCL) Take 1-2 tablets every 6 hrs for breakthrough pain.  #10 x 0   Entered and Authorized by:   Priscella Mann MD   Signed by:   Lucianne Muss Park MD on 12/07/2009   Method used:   Print then Give to Patient   RxID:   [id number]  Will give Rx for 10 5 mg tablets. Uncomfortable with going up on dose before seeing patient. If patient feels pain is so severe that this medicine will not be enough, then please have her make appt to come to clinic sooner.   Appended Document: Rx request oxycodone Patient informed of above

## 2010-04-09 NOTE — Miscellaneous (Signed)
Summary: Changes from 02/2010 hospital D/C     Past History:  Past Medical History: Past issues: h/o syphillis: + RPR s/p RX, adequate decrease in titer to 1:8 (3/04 & 4/06)-->titer 1:80 (01/2010), T. pallidum Ab >8.0 h/o basal cell CA on left side of nose (8/05)Hypothyroidism h/o CHF 2/2 hypothyroidism h/o plantar fasciitis h/o premenstrual dysphoric disorder, S/p SVD x 2 ?OA lower extremities h/o inguinal abscess s/p I&D & antibiotics (10/2009) h/o ganglion cyst 1.5x0.5cm on 07/2009 managed conservatively  Hospitalizations   -(05/2001) candidal intertrigo (3/03),    -(07/2004) hypersensitivity rxn   -(05/2000) LE cellulitis (3/02),    -(01/2010) likely contact dermatitis causing upper thigh erythema & pelvic irritation & bilateral palmar erythema & vesicles  Current Problems:  Depression, anxiety    -on Cymbalta & Risperdal p 01/2010 hospitalization; noted not tolerate Seroquel or Abilify Congenital lymphedema  HYPOTHYROIDISM, UNSPECIFIED (ICD-244.9)   -h/o multinodular goiter  DYSPEPSIA (ICD-536.8)  EXCESSIVE MENSTRUATION (ICD-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  No fracture.  No neural foraminal narrowing.     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   -epidural steroid injection x2 (01/2004) of R L5-S1   Allergies: No Known Drug Allergies

## 2010-04-09 NOTE — Progress Notes (Signed)
Summary: refill  Phone Note Refill Request Call back at Home Phone [phone] Message from:  Patient  Refills Requested: Medication #1:  PERCOCET 5-325 MG TABS 1-2 by mouth q 4-6 hrs as needed pain Please call when ready   Next Appointment Scheduled: 09/04/09 Initial call taken by: De Nurse,  September 01, 2009 10:17 AM  Follow-up for Phone Call        at front desk for pick up. be sure she is taking her neurontin as prescribed by sports medicine as well. Follow-up by: Ancil Boozer  MD,  September 01, 2009 11:24 AM  Additional Follow-up for Phone Call Additional follow up Details #1::        Patient informed, she is still taking neurontin Additional Follow-up by: Garen Grams LPN,  September 01, 2009 3:28 PM
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
## 2010-04-09 NOTE — Progress Notes (Signed)
Summary: Rx Prob  Phone Note Call from Patient   Caller: Patient Summary of Call: Said the rx that was to be sent to [facility]. the pharmacy says they do not have. Initial call taken by: Clydell Hakim,  May 21, 2009 9:12 AM  Follow-up for Phone Call        sent again [facility] Follow-up by: Ancil Boozer  MD,  May 21, 2009 11:29 AM    Prescriptions: SYNTHROID 200 MCG TABS (LEVOTHYROXINE SODIUM) 1 by mouth daily  #30 x 3   Entered and Authorized by:   Ancil Boozer  MD   Signed by:   Ancil Boozer  MD on 05/21/2009   Method used:   Electronically to        [facility] [address] (retail)       [address]       [place], [place]  [address]       Ph: [phone]       Fax: [phone]   RxID:   [id number]  message left on voicemail that rx has been sent again. Theresia Lo RN  May 21, 2009 12:05 PM

## 2010-04-09 NOTE — Progress Notes (Signed)
Summary: Phone [facility]  Phone Note Call from Patient Call back at [phone]   Caller: Patient Summary of Call: Looking for a form for [facility] to get her a nurses assistant. Initial call taken by: Clydell Hakim,  December 22, 2009 3:34 PM  Follow-up for Phone Call        Had [facility] send another one as Dr.Oh Park never reicieved.                 It is in her box. Follow-up by: Jone Baseman CMA,  December 22, 2009 3:54 PM  Additional Follow-up for Phone Call Additional follow up Details #1::        Pt requesting full time  hours for care until it is expired. They will also help her do her PT. Additional Follow-up by: Clydell Hakim,  December 23, 2009 9:00 AM    Pt will get PT functional eval & recs before considering this service,
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
## 2010-04-09 NOTE — Assessment & Plan Note (Signed)
Summary: boil,tcb   Vital Signs:  Patient profile:   45 year old female Height:      68 inches Weight:      225.3 pounds BMI:     34.38 Temp:     98.4 degrees F oral Pulse rate:   60 / minute BP sitting:   116 / 81  (left arm) Cuff size:   regular  Vitals Entered By: Garen Grams LPN (October 02, 2009 4:23 PM) CC: boil on inner thigh Is Patient Diabetic? No Pain Assessment Patient in pain? no        Primary Care Provider:  Ancil Boozer  MD  CC:  boil on inner thigh.  History of Present Illness: boil: started about 8 days ago on inner right thigh.  patient reports it got to the size of an egg before rupturing 2 days ago releasing pus.  since then it has started to get larger again.  the patient reports some associated fever to as high as 101.8.  she reports she has been using percocet for relief.  otherwise she has not been doing anything else to help it.  she has a history of boils some of which have also caused a flare of her congenital lymphedema.   Habits & Providers  Alcohol-Tobacco-Diet     Tobacco Status: current     Tobacco Counseling: to quit use of tobacco products     Cigarette Packs/Day: 1.0  Current Medications (verified): 1)  Prilosec 40 Mg  Cpdr (Omeprazole) .... Take 1 Pill By Mouth Twice Daily 2)  Lipitor 40 Mg Tabs (Atorvastatin Calcium) .Marland Kitchen.. 1 By Mouth At Bedtime For Cholesterol 3)  Ventolin Hfa 108 (90 Base) Mcg/act Aers (Albuterol Sulfate) .... 2 Puffs As Needed Up To Four Times Daily 4)  Percocet 5-325 Mg Tabs (Oxycodone-Acetaminophen) .Marland Kitchen.. 1-2 By Mouth Q 4-6 Hrs As Needed Pain 5)  Valium 5 Mg Tabs (Diazepam) .Marland Kitchen.. 1 By Mouth Three Times A Day As Needed 6)  Levothroid 150 Mcg Tabs (Levothyroxine Sodium) .Marland Kitchen.. 1 By Mouth Once Daily For Thyroid 7)  Diclofenac Sodium 75 Mg Tbec (Diclofenac Sodium) .Marland Kitchen.. 1 By Mouth Two Times A Day For Pain 8)  Neurontin 300 Mg Caps (Gabapentin) .Marland Kitchen.. 1 Cap By Mouth At Bedtime X 3 Days Then Two Times A Day X 3 Days Then Three  Times A Day 9)  Doxycycline Hyclate 100 Mg Tabs (Doxycycline Hyclate) .Marland Kitchen.. 1 By Mouth Two Times A Day For Infection For 10 Days.  Allergies (verified): No Known Drug Allergies  Past History:  Past medical, surgical, family and social histories (including risk factors) reviewed for relevance to current acute and chronic problems.  Past Medical History: Reviewed history from 10/30/2008 and no changes required. 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, H/o hospitalization for candidal intertrigo (3/03),  H/o hospitalization for hypersensitivity rxn(5/06),  H/o hospitalization for LE cellulitis (3/02),  Multinodular goiter,  Plantar fasciitis,  Premenstrual dysphoric disorder, S/p SVD x 2 smoker ?OA lower extremities  Current Problems:  DEPRESSIVE DISORDER, NOS (ICD-311) LYMPHEDEMA (ICD-457.1) HYPOTHYROIDISM, UNSPECIFIED (ICD-244.9) DYSPEPSIA (ICD-536.8) EXCESSIVE MENSTRUATION (ICD-626.2) GANGLION CYST (ICD-727.43) HIDRADENITIS SUPPURATIVA (ICD-705.83) Hx of TOBACCO DEPENDENCE (ICD-305.1) MIGRAINE, UNSPEC., W/O INTRACTABLE MIGRAINE (ICD-346.90) FIBROADENOSIS, BREAST (ICD-610.2) BACK PAIN, LOW (ICD-724.2)  Past Surgical History: Reviewed history from 09/04/2009 and no changes required. Cryotherapy plantar warts (feet) - 02/12/2000,  Laparoscopic BTL (Dr. Okey Dupre) - 01/06/2002,  Lipoma excision (L popliteal fossa) - 07/28/2000,  Norplant removal (Dr. Ezzard Standing) - 12/06/2001, Pelvic U/S:  L hemorr. cyst, small fibroid - 06/07/2003,  Right L4-5, L5-S1 facet injections - 01/07/2004,  Right L5-S1 epidural injection-no relief - 01/07/2004,  Right nose lesion biopsy (Dr. Joseph Art) - 10/07/2003,  Thyroidectomy   Family History: Reviewed history from 07/13/2006 and no changes required. Brain CA in uncle, dad MI at 15, Diabetes and HTN., Son:  depression, ADHD, migraines   mother: living, Type II DM  Social History: Reviewed history from 10/30/2008 and no changes required. smoker  ; On disability. ; Has 1 daughter Aroush Chasse) and 1 son Marlinda Mike [place]).  ; social alchol , no drugs. No caffeine.Packs/Day:  1.0  Review of Systems       per HPI patient notes her back is doing better after toradol injection and using her neighbor's stronger dose percocet instead of hers and a friend's xanax.   Physical Exam  General:  Well-developed, overweight female in NAD sitting comfortably in chair Skin:  R inguinal crease with indurated, erythematous warm area with central flunctuance consistent with abscess.  no active drainage. tender to touch.  Inguinal Nodes:  none palpated Additional Exam:  procedure note: informed consent obtained (see scanned in) for I&D abscess area prepped with betadyne x1 and alcohol x1 then anesthestized with 5cc 2% lidocaine with epi and ethyl chloride spray area reprepped with betadyne x3 and alcohol x1 then opened with #11 blade.  small amount of pus obtained.  loculations broke up with curved hemostats.  elliptical incision made to allow continued drainage and opening of wound for healing.  approx 10cc blood loss.  pressure dressing with bacitracin ointment and gauze applied.  patient tolerated procedure well.   Impression & Recommendations:  Problem # 1:  INGUINAL ABSCESS (ICD-682.2) Assessment New  s/p I&D given risk of increased infection with her congenital lymphedema will additionally prescribe abx course for 10 days. instructions on cleaning, wound care reviewed.   f/u next week for recheck at that time may have to do a refill on her percocet because she wasn't given any additional tabs for this procedure since she had been reportely using her neighbors.  Her updated medication list for this problem includes:    Doxycycline Hyclate 100 Mg Tabs (Doxycycline hyclate) .Marland Kitchen... 1 by mouth two  times a day for infection for 10 days.  Orders: [facility]- Est Level  3 ([address]) I&D Abcess, simple- [facility] ([address])  Complete Medication List: 1)  Prilosec 40 Mg Cpdr (Omeprazole) .... Take 1 pill by mouth twice daily 2)  Lipitor 40 Mg Tabs (Atorvastatin calcium) .Marland Kitchen.. 1 by mouth at bedtime for cholesterol 3)  Ventolin Hfa 108 (90 Base) Mcg/act Aers (Albuterol sulfate) .... 2 puffs as needed up to four times daily 4)  Percocet 5-325 Mg Tabs (Oxycodone-acetaminophen) .Marland Kitchen.. 1-2 by mouth q 4-6 hrs as needed pain 5)  Valium 5 Mg Tabs (Diazepam) .Marland Kitchen.. 1 by mouth three times a day as needed 6)  Levothroid 150 Mcg Tabs (Levothyroxine sodium) .Marland Kitchen.. 1 by mouth once daily for thyroid 7)  Diclofenac Sodium 75 Mg Tbec (Diclofenac sodium) .Marland Kitchen.. 1 by mouth two times a day for pain 8)  Neurontin 300 Mg Caps (Gabapentin) .Marland Kitchen.. 1 cap by mouth at bedtime x 3 days then two times a day x 3 days then three times a day 9)  Doxycycline Hyclate 100 Mg Tabs (Doxycycline hyclate) .Marland Kitchen.. 1 by mouth two times a  day for infection for 10 days.  Patient Instructions: 1)  see abscess incision and drainage aftercare handout.  Prescriptions: VENTOLIN HFA 108 (90 BASE) MCG/ACT AERS (ALBUTEROL SULFATE) 2 puffs as needed up to four times daily  #1 x 3   Entered and Authorized by:   Ancil Boozer  MD   Signed by:   Ancil Boozer  MD on 10/02/2009   Method used:   Electronically to        [facility] [id number] (retail)       [address] [place], [place]  [address]       Ph: [phone]       Fax: [phone]   RxID:   [id number] DOXYCYCLINE HYCLATE 100 MG TABS (DOXYCYCLINE HYCLATE) 1 by mouth two times a day for infection for 10 days.  #20 x 0   Entered and Authorized by:   Ancil Boozer  MD   Signed by:   Ancil Boozer  MD on 10/02/2009   Method used:   Electronically to        [facility] [id number] (retail)       [address] [place], [place]  [address]        Ph: [phone]       Fax: [phone]   RxID:   [id number]

## 2010-04-09 NOTE — Progress Notes (Signed)
Summary: Appt  Phone Note Call from Patient Call back at Home Phone [phone]   Summary of Call: pt has hospital f/u  appt today at 3 but will not be able to make it bc the  rehabilitation RN is suppose to come to see pt today at 2:30, she wants to see them 1st before coming here. Pt suppose to see Madolyn Frieze but the next opening is 1/24, she wants to know if she can schedule hospital f/u with Dr. Mauricio Po since she saw him in the hospital? Initial call taken by: Knox Royalty,  March 10, 2010 11:09 AM    Renard Hamper, I would prefer her following-up with me in light of her psychiatric issues and other medical problems. Unless her rash is getting worse, would you have her follow-up with me on the 24th? If her rash or other issues during hospitalization is worse, she may follow-up with Dr. Tye Savoy (who took care of her then) if possible. If not, then I think another resident on [facility]. Thank you Denny Peon.   Appended Document: Appt Left message on voicemail informing patient of above.

## 2010-04-09 NOTE — Miscellaneous (Signed)
  Clinical Lists Changes     Results of lumbar spine MRI from April 2005    Clinical Data:    Low back pain.  Difficulty walking.  Right hip   pain.   MRI OF THE LUMBAR SPINE WITHOUT CONTRAST   Multiplanar T1- and T2-weighted imaging was performed.   Alignment of the spine is normal.  The intervertebral discs are   normal at L4-5 and above.  The canal and foramina are widely patent.   The distal cord and conus are normal.   At L5-S1, the disc shows mild desiccation and posterior annular   tearing with a minimal disc bulge.  This is slightly more pronounced   towards the right.  This approaches the S1 nerve roots as they bud   from the thecal sac, more on the right than on the left.  This could   irritate the right S1 nerve root but nerve compression is not   demonstrated.  Neural foramina appear widely patent.  No evidence of   facet disease or pars defects.   IMPRESSION   The only abnormality is at L5-S1 where there are minor changes of   degenerative disc disease with posterior annular tearing in a shallow   right posterolateral protrusion which abuts the right S1 nerve root   and could irritate it.

## 2010-04-09 NOTE — Progress Notes (Signed)
Summary: phn msg  Phone Note Call from Patient Call back at [phone]   Caller: Patient Summary of Call: pt doesn't want [facility] to come anymore but they will not do that unless we tell them.  Initial call taken by: De Nurse,  October 27, 2009 11:37 AM  Follow-up for Phone Call        OK to give order to stop services from [facility].  Please note that PCP will be Dr. Madolyn Frieze Follow-up by: Doralee Albino MD,  October 27, 2009 11:55 AM  Additional Follow-up for Phone Call Additional follow up Details #1::        called and lvm for pt to call back Additional Follow-up by: Loralee Pacas CMA,  October 27, 2009 2:11 PM    Additional Follow-up for Phone Call Additional follow up Details #2::    spoke with pamela at [facility] and she stated that they have not been billing ms. Cottam b/c they no longer provide services for her. Follow-up by: Loralee Pacas CMA,  October 27, 2009 3:07 PM  forwarded to pcp.Loralee Pacas CMA  October 27, 2009 11:41 AM

## 2010-04-09 NOTE — Progress Notes (Signed)
Summary: resch  Phone Note Call from Patient Call back at Home Phone [phone]   Caller: Patient Summary of Call: cannot come today b/c she is still bedridden and resch for next week. Initial call taken by: De Nurse,  March 11, 2009 11:26 AM

## 2010-04-09 NOTE — Progress Notes (Signed)
Summary: Phone request to get thyroid checked   Phone Note Call from Patient   Caller: Patient Call For: [phone] or [phone] Summary of Call: Patient would like to have her thyroid check.  Want an order put in for labs. Initial call taken by: Abundio Miu,  March 20, 2010 9:39 AM    She just had her thyroid level checked in December, and it was 6.791, but I will order one now, and she can make a lab visit to have that drawn. Also, pease inform patient that I will not order any more labs/give prescriptions via phone messages any more, especially since she missed her appointment a few weeks ago. Unless it is urgent, please ask her to write her questions down and bring them to her next visit.

## 2010-04-09 NOTE — Miscellaneous (Signed)
Summary: Consent: Incision & Drainage  Consent: Incision & Drainage   Imported By: Knox Royalty 02/05/2010 08:51:15  _____________________________________________________________________  External Attachment:    Type:   Image     Comment:   External Document

## 2010-04-09 NOTE — Progress Notes (Signed)
Summary: Multiple issues with Rxs  Phone Note Other Incoming Call back at [phone]   Initial call taken by: Knox Royalty,  March 17, 2010 10:44 AM Caller: Tonji/case Product manager of Call: pt was given sample of  triamcinolone 0.5 in the hospital, is out & does not have appt with [facility] until 2/1, wants Rx sent to her pharmacy, pt also asking for  refill on percocet,  risperdol, there is some confusion with the dosing of this medication, bottle says take 1 tablet twice daily, discharge summary says 1/2 tablet twice daily & also asking for diclofenac 75 mg Initial call taken by: Knox Royalty,  March 17, 2010 10:54 AM    New/Updated Medications: OXYCODONE HCL 5 MG TABS (OXYCODONE HCL) Take 1 tablet up to twice daily as needed for pain. Prescriptions: OXYCODONE HCL 5 MG TABS (OXYCODONE HCL) Take 1 tablet up to twice daily as needed for pain.  #60 x 0   Entered and Authorized by:   Priscella Mann MD   Signed by:   Lucianne Muss Park MD on 03/18/2010   Method used:   Print then Give to Patient   RxID:   [id number] DICLOFENAC SODIUM 75 MG TBEC (DICLOFENAC SODIUM) 1 by mouth two times a day for pain  #60 x 0   Entered and Authorized by:   Priscella Mann MD   Signed by:   Lucianne Muss Park MD on 03/18/2010   Method used:   Print then Give to Patient   RxID:   [id number] TRIAMCINOLONE ACETONIDE 0.5 % CREA (TRIAMCINOLONE ACETONIDE) Apply two times a day  #60 gm x 1   Entered and Authorized by:   Priscella Mann MD   Signed by:   Lucianne Muss Park MD on 03/18/2010   Method used:   Print then Give to Patient   RxID:   [id number] RISPERDAL 0.5 MG TABS (RISPERIDONE) Take 1 tablet twice daily.  #60 x 0   Entered and Authorized by:   Priscella Mann MD   Signed by:   Lucianne Muss Park MD on 03/18/2010   Method used:   Print then Give to Patient   RxID:   [id number]  Please have patient pick up 4 prescriptions. Giving Rx for up to 1 tablet two times a day of oxycodone in case patient  asks. Will not give any more refills. Patient must be here for next appointment if she wants anything else. She already re-scheduled this appointment from previous.  Appended Document: Multiple issues with Rxs attempted to call pt, "voicemail has not been set up yet" Will wait for pt to return call.

## 2010-04-09 NOTE — Progress Notes (Signed)
Summary: Phone back/neck pain  Phone Note Call from Patient Call back at [phone] or [phone]    Caller: Patient Summary of Call: needs help for assistance 5 hrs per day - would like to use [facility] Initial call taken by: De Nurse,  December 08, 2009 9:00 AM  Follow-up for Phone Call        Left message on voicemail for patient to return call. Follow-up by: Garen Grams LPN,  December 08, 2009 12:24 PM  Additional Follow-up for Phone Call Additional follow up Details #1::        Patient informed that this could be taken care of at her upcoming appt, she states she needs this done before then due to the amount of pain shes in and that they have already faxed the paperwork. I told her that these request are usually handled at ofice visits but I would inform MD. Patient also asking for refill on Valuim. Message to MD. Additional Follow-up by: Garen Grams LPN,  December 08, 2009 3:48 PM     Pt says she is in more pain but hasn't been taking medicines. Recommended she try taking medicines but if she feels the pain is worse or if she is concerned about new symptoms or new pains, to go to the ED to have them examined.

## 2010-04-09 NOTE — Progress Notes (Signed)
Summary: Rx request levothyroxin, valium, oxycodone   Phone Note Refill Request Call back at [phone] Message from:  Patient  Refills Requested: Medication #1:  OXYCODONE HCL 5 MG TABS Take 1-2 tablets every 6 hrs for breakthrough pain.  Medication #2:  VALIUM 5 MG TABS 1 by mouth three times a day as needed  Medication #3:  LEVOTHROID 150 MCG TABS 1 by mouth once daily for thyroid thyroid meds are not working - needs to know what to change it to. pt is out  Initial call taken by: De Nurse,  January 26, 2010 10:55 AM  Follow-up for Phone Call        will forward message to MD.  spoke with patient and she states based on her last bloodwork MD was going to give her another dose. this has never been done. she has continued taking the old dose of 150 mg . however has been out of that for 2 days. will forward to MD, Follow-up by: Theresia Lo RN,  January 26, 2010 11:26 AM    New/Updated Medications: LEVOTHROID 175 MCG TABS (LEVOTHYROXINE SODIUM) Take one tablet daily for your low thyroid hormone. Prescriptions: LEVOTHROID 175 MCG TABS (LEVOTHYROXINE SODIUM) Take one tablet daily for your low thyroid hormone.  #30 x 2   Entered and Authorized by:   Priscella Mann MD   Signed by:   Lucianne Muss Park MD on 01/26/2010   Method used:   Electronically to        [facility] [id number] (retail)       [address] [place], [place]  [address]       Ph: [phone]       Fax: [phone]   RxID:   [id number]  Rx for increased dose of thyroid medicine sent to [facility] on [address].  At patient's last visit, gave enough oxycodone and Valium to last for about 3-4 months if patient takes according to how she told me. Told patient would not give more between visits and will not be able to give more at this time. Patient asked to re-schedule for 3-4 months after last visit and patient has not done this.

## 2010-04-09 NOTE — Letter (Signed)
Summary: Results Follow-up Letter  All     ,     Phone:   Fax:     05/19/2009  [address] [place], [place]  [address]  Dear [name],   The following are the results of your recent test(s):  Test     Result     Pap Smear    Normal___X___  Not Normal_____  Other tests were normal except your thyroid.  Your current thyroid medication is too strong and we need to decrease the dose as the nurse told you.  Sincerely,    Tinnie Gens MD

## 2010-04-09 NOTE — Initial Assessments (Signed)
Summary: history and physical   Vital Signs:  Patient profile:   45 year old female Pulse rate:   76 / minute Pulse rhythm:   regular Resp:     18 per minute BP sitting:   126 / 88  Primary Care Provider:  Priscella Mann MD   History of Present Illness: 45 yo female with hx of multiple admissionis for cellulits last one in 2008 seen in ED today with bilateral upper thigh erythema and pelvic irritation.  Pt states that she usually has problems with this one week before her period, whcih is the right timing, but has not gotten this bad since last admission in 2008.  Pt states the redness increased from the pelvic area initially then to her thighs and then notices spots on her arms and hands.  The palms of her hands became red as well.  Pt states she has felt feverish and chills from time to time over the last 2 days but no vominting or nausea noted.  Pt denies any vaginal discharge at thie time or bleeding.  Pt though from the irritation states there is area between thighs that have some bleeding.   Pt denies shortness of breath, CP, states the rash has not prgressed too much since she has been here, pt is very itchy though.   Current Medications (verified): 1)  Prilosec 40 Mg  Cpdr (Omeprazole) .... Take 1 Pill By Mouth Twice Daily 2)  Ventolin Hfa 108 (90 Base) Mcg/act Aers (Albuterol Sulfate) .... 2 Puffs As Needed Up To Four Times Daily 3)  Valium 5 Mg Tabs (Diazepam) .Marland Kitchen.. 1 By Mouth Three Times A Day As Needed 4)  Levothroid 175 Mcg Tabs (Levothyroxine Sodium) .... Take One Tablet Daily For Your Low Thyroid Hormone. 5)  Diclofenac Sodium 75 Mg Tbec (Diclofenac Sodium) .Marland Kitchen.. 1 By Mouth Two Times A Day For Pain 6)  Oxycodone Hcl 5 Mg Tabs (Oxycodone Hcl) .... Take 1-2 Tablets Every 6 Hrs For Breakthrough Pain. 7)  Cymbalta 30 Mg Cpep (Duloxetine Hcl) .... Take One Tablet Daily. 8)  Ativan 2 Mg Tabs (Lorazepam) .... Take 1 Tablet A Day As Needed For Your Nerves. 9)  Lortab 10-500 Mg Tabs  (Hydrocodone-Acetaminophen)  Allergies (verified): No Known Drug Allergies  Past History:  Past medical, surgical, family and social histories (including risk factors) reviewed, and no changes noted (except as noted below).  Past Medical History: Reviewed history from 01/26/2010 and no changes required. 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, H/o hospitalization for candidal intertrigo (3/03),  H/o hospitalization for hypersensitivity rxn(5/06),  H/o hospitalization for LE cellulitis (3/02),  Multinodular goiter,  Plantar fasciitis,  Premenstrual dysphoric disorder, S/p SVD x 2 smoker ?OA lower extremities  Current Problems:  DEPRESSIVE DISORDER, NOS (ICD-311) LYMPHEDEMA (ICD-457.1) HYPOTHYROIDISM, UNSPECIFIED (ICD-244.9) DYSPEPSIA (ICD-536.8) EXCESSIVE MENSTRUATION (ICD-626.2) GANGLION CYST (ICD-727.43) HIDRADENITIS SUPPURATIVA (ICD-705.83) Hx of TOBACCO DEPENDENCE (ICD-305.1) MIGRAINE, UNSPEC., W/O INTRACTABLE MIGRAINE (ICD-346.90) FIBROADENOSIS, BREAST (ICD-610.2) BACK PAIN, LOW (ICD-724.2)  -XR C-spine (02/2009): progressive degenerative disc disease changes at C4-5 and   C5-6.  No fracture.  No neural foraminal narrowing.    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  -epidural steroid injection x2 (01/2004) of R L5-S1  Past  Surgical History: Reviewed history from 09/04/2009 and no changes required. Cryotherapy plantar warts (feet) - 02/12/2000,  Laparoscopic BTL (Dr. Okey Dupre) - 01/06/2002,  Lipoma excision (L popliteal fossa) - 07/28/2000,  Norplant removal (Dr. Ezzard Standing) - 12/06/2001, Pelvic U/S:  L hemorr. cyst, small fibroid - 06/07/2003,  Right L4-5, L5-S1 facet injections - 01/07/2004,    Right L5-S1 epidural injection-no relief - 01/07/2004,  Right nose lesion biopsy (Dr. Joseph Art) - 10/07/2003,  Thyroidectomy   Family History: Reviewed history from 07/13/2006 and no changes required. Brain CA in uncle, dad MI at 60, Diabetes and HTN., Son: depression, ADHD, migraines   mother: living, Type II DM  Social History: Reviewed history from 12/18/2009 and no changes required. Smokes 1.5 ppd.  Tried stopping but stressors, increased panic attacks, & worsening back pain caused her to start again. Occ alcohol, denies drugs.  On disability for congenital lymphedema. 1 daughter [name]) and 1 son [name]). Son lives with her.   Review of Systems       see hpi  Physical Exam  General:  sitting in chair NAD initially but appears to be in more pain during interview and is itching Eyes:  PERRLA, EOMI Ears:  TM intact b/l Mouth:  good dentition and pharynx pink and moist.   Lungs:  CTAB, no w/r/r, good aeration Heart:  normal rate, regular rhythm, no murmur, no gallop, and no rub.   Abdomen:  soft,, normal bowel sounds, and no distention.  TTP in suprapubic region.   Genitalia:  Normal introitus for age, Msk:  5/5 strength in all extremities Pulses:  1+ DP/PT pulses Extremities:  3+ edema about equally bilaterally Neurologic:  hesitant and slow gait; sensation intact lower extremities (legs and feet) bilaterally Skin:  pt has significant erythemic macular papular rash on the pelvic region extending to the upper thighs, on the abdomen and extending down but staying above the knees.   Pt has also palmar erythema B/l with vesicular formation on the thenar eminence bilaterally.   Pt does have mild swelling of the hands bilaterally as well. some erythema on the forearms as well.   no rash on back or chest or rest of core.    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  Pt has  been treated multiple times before, no signs of abscess formation.  Will continue clindamycin 450mg  three times a day for now and monitor for improvement.  Will add on fluconazole to cover for possible yeast seeing how pt has had concern for it with previous admits.  Will get other labs to narrow our differential and rule out GC/chlamydia, HIV, ANA, RPR, all of which are true concerns.  Pt though does not appear ill at moment but will get ESR and CRP to help see if we feel this is systemic or not.  Will also get blood cultures to rule out any type of growth.   Will get CBC in AM Other differential much less likely would be eosinophelic cellulitis with it occuring with her menstration as well as being puritic. Just to keep in mind.  Risk stratify with A1C in AM  Problem # 2:  ANXIETY (ICD-300.00) cymbalta, ativan continued.   Problem # 3:  DEPRESSIVE DISORDER, NOS (ICD-311) Continue cymbalta  Problem # 4:  LYMPHEDEMA (ICD-457.1) pt is at her baseline. Congenital problem per her hx.   Problem # 5:  HYPOTHYROIDISM, UNSPECIFIED (ICD-244.9)  Her updated medication list for this problem  includes:    Levothroid 175 Mcg Tabs (Levothyroxine sodium) .Marland Kitchen... Take one tablet daily for your low thyroid hormone.  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   Problem # 7:  FEN/GI regular diet, SLIV  Problem # 8:  Ppx Heparin  Problem # 9:  Dispo Pending further workup and improvement.   Complete Medication List: 1)  Prilosec 40 Mg Cpdr (Omeprazole) .... Take 1 pill by mouth twice daily 2)  Ventolin Hfa 108 (90 Base) Mcg/act Aers (Albuterol sulfate) .... 2 puffs as needed up to four times daily 3)  Valium 5 Mg Tabs (Diazepam) .Marland Kitchen.. 1 by mouth three times a day as needed 4)  Levothroid 175 Mcg Tabs (Levothyroxine sodium) .... Take one tablet daily for your low thyroid hormone. 5)  Diclofenac Sodium 75 Mg Tbec (Diclofenac sodium) .Marland Kitchen.. 1 by mouth  two times a day for pain 6)  Oxycodone Hcl 5 Mg Tabs (Oxycodone hcl) .... Take 1-2 tablets every 6 hrs for breakthrough pain. 7)  Cymbalta 30 Mg Cpep (Duloxetine hcl) .... Take one tablet daily. 8)  Ativan 2 Mg Tabs (Lorazepam) .... Take 1 tablet a day as needed for your nerves. 9)  Lortab 10-500 Mg Tabs (Hydrocodone-acetaminophen)

## 2010-04-09 NOTE — Miscellaneous (Signed)
Summary: Spine imaging in past      Past History:  Past Medical History: 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, H/o hospitalization for candidal intertrigo (3/03),  H/o hospitalization for hypersensitivity rxn(5/06),  H/o hospitalization for LE cellulitis (3/02),  Multinodular goiter,  Plantar fasciitis,  Premenstrual dysphoric disorder, S/p SVD x 2 smoker ?OA lower extremities  Current Problems:  DEPRESSIVE DISORDER, NOS (ICD-311) LYMPHEDEMA (ICD-457.1) HYPOTHYROIDISM, UNSPECIFIED (ICD-244.9) DYSPEPSIA (ICD-536.8) EXCESSIVE MENSTRUATION (ICD-626.2) GANGLION CYST (ICD-727.43) HIDRADENITIS SUPPURATIVA (ICD-705.83) Hx of TOBACCO DEPENDENCE (ICD-305.1) MIGRAINE, UNSPEC., W/O INTRACTABLE MIGRAINE (ICD-346.90) FIBROADENOSIS, BREAST (ICD-610.2) BACK PAIN, LOW (ICD-724.2)  -XR C-spine (02/2009): progressive degenerative disc disease changes at C4-5 and   C5-6.  No fracture.  No neural foraminal narrowing.    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  -epidural steroid injection x2 (01/2004) of R L5-S1
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
## 2010-04-09 NOTE — Progress Notes (Signed)
  Phone Note Call from Patient   Caller: Patient Details for Reason: Thryoid medication Summary of Call: Pt seen on 3/4 for CPE, thryoid checked TSH 0.17, currently taking Synthroid daily, her bottles and instructions state 2 tabs daily. Has been feeling more jittery and lethargic. States a nurse called her and told to decrease her dose to but, no meds sent in. She took 2 tablets today her regular dosing. I told her to take 1 1/2 tablets until she could get the new prescription. Will send in new meds. Needs follow-up with PCP  Initial call taken by: Milinda Antis MD,  May 20, 2009 5:30 PM    Prescriptions: SYNTHROID 200 MCG TABS (LEVOTHYROXINE SODIUM) 1 by mouth daily  #30 x 3   Entered and Authorized by:   Milinda Antis MD   Signed by:   Milinda Antis MD on 05/20/2009   Method used:   Electronically to        [facility] [id number] (retail)       [address] [place], [place]  [address]       Ph: [phone]       Fax: [phone]   RxID:   [id number]

## 2010-04-09 NOTE — Progress Notes (Signed)
  Phone Note Call from Patient   Caller: Patient Summary of Call: Having swelling and subjective fever and painful red thighs. Says this is simmilar to prior cellulitis events in the past that required hospitilizations. Thinks this is all due to a yeast infection. Denies any trouble breathing, feeling systemically unwell or other red flags at this time. She thinks that she can wait for an appointment today.  Advised that is she worsens or develops red flag signs or symptoms she sould go to the ED directlly. Pt expresses understanding and will call for a work in appointment this morning.  Initial call taken by: Clementeen Graham MD,  February 09, 2010 7:07 AM

## 2010-04-09 NOTE — Miscellaneous (Signed)
Summary: synthroid dose change

## 2010-04-09 NOTE — Progress Notes (Signed)
Summary: Rx  Phone Note Refill Request Call back at Home Phone [phone]   Refills Requested: Medication #1:  DICLOFENAC SODIUM 75 MG TBEC 1 by mouth two times a day for pain also needs triamcinolone  Initial call taken by: Knox Royalty,  March 06, 2010 10:43 AM  Follow-up for Phone Call        will forward to preceptor. Follow-up by: Theresia Lo RN,  March 06, 2010 12:06 PM

## 2010-04-09 NOTE — Assessment & Plan Note (Signed)
Summary: CPP/EO   Vital Signs:  Patient profile:   45 year old female Height:      68 inches Weight:      233 pounds BMI:     35.56 BSA:     2.18 Temp:     99.1 degrees F Pulse rate:   66 / minute BP sitting:   127 / 89  Vitals Entered By: Jone Baseman CMA (May 09, 2009 10:31 AM) CC: CPP Is Patient Diabetic? No Pain Assessment Patient in pain? no        Primary Care Provider:  Ancil Boozer  MD  CC:  CPP.  History of Present Illness: Multiple complaints today: 1.  Wants pap smear and full STD testing.  No c/o's 2.  Chronic back pain and degenerative arthritis 3.  COPD with difficulty breathing exertional dyspnea 4.  Lymphedema, chronic pain and plantar fasciitis-wants injection 5.  Interested in hysterectomy-chronic pelvic pain and dysmenorrhea.    6.  Also has PMDD and has stopped cymbalta per ED--made her dizzy 7.  Has gained weight since quitting smoking-wants appetite suppressant 8.  Wants something for sinuses 9.  Needs all meds refilled 10.  Needs thyroid checked 11.  Declines mammogram secondary to fear  Habits & Providers  Alcohol-Tobacco-Diet     Tobacco Status: quit  Current Problems (verified): 1)  Screening For Malignant Neoplasm of The Cervix  (ICD-V76.2) 2)  Contact or Exposure To Other Viral Diseases  (ICD-V01.79) 3)  Neck and Back Pain  (ICD-723.1) 4)  R/O Memory Loss  (ICD-780.93) 5)  Fatigue  (ICD-780.79) 6)  Irregular Menses  (ICD-626.4) 7)  Chest Pain, Atypical  (ICD-786.59) 8)  Plantar Wart, Left  (ICD-078.12) 9)  Hyperlipidemia, Borderline  (ICD-272.4) 10)  Polyarthralgia  (ICD-719.49) 11)  Plantar Fasciitis, Bilateral  (ICD-728.71) 12)  Depressive Disorder, Nos  (ICD-311) 13)  Lymphedema  (ICD-457.1) 14)  Hypothyroidism, Unspecified  (ICD-244.9) 15)  Dyspepsia  (ICD-536.8) 16)  Ganglion Cyst  (ICD-727.43) 17)  Hidradenitis Suppurativa  (ICD-705.83) 18)  Tobacco Dependence  (ICD-305.1) 19)  Migraine, Unspec., w/o Intractable  Migraine  (ICD-346.90) 20)  Fibroadenosis, 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 Take 1 Pill By Mouth Twice Daily 2)  Synthroid 125 Mcg  Tabs (Levothyroxine Sodium) .... 2 Tabs By Mouth Each Morning 3)  Simvastatin 40 Mg Tabs (Simvastatin) .Marland Kitchen.. 1 By Mouth At Bedtime For Cholesterol 4)  Celexa 40 Mg Tabs (Citalopram Hydrobromide) .Marland Kitchen.. 1 By Mouth Daily During Affected Wk 5)  Ventolin Hfa 108 (90 Base) Mcg/act Aers (Albuterol Sulfate) .... 2 Puffs As Needed Up To Four Times Daily 6)  Percocet 5-325 Mg Tabs (Oxycodone-Acetaminophen) .Marland Kitchen.. 1-2 By Mouth Q 4-6 Hrs As Needed Pain 7)  Valium 5 Mg Tabs (Diazepam) .Marland Kitchen.. 1 By Mouth Three Times A Day As Needed  Allergies (verified): No Known Drug Allergies  Past History:  Past Medical History: Last updated: 10/30/2008 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, H/o hospitalization for candidal intertrigo (3/03),  H/o hospitalization for hypersensitivity rxn(5/06),  H/o hospitalization for LE cellulitis (3/02),  Multinodular goiter,  Plantar fasciitis,  Premenstrual dysphoric disorder, S/p SVD x 2 smoker ?  OA lower extremities  Current Problems:  DEPRESSIVE DISORDER, NOS (ICD-311) LYMPHEDEMA (ICD-457.1) HYPOTHYROIDISM, UNSPECIFIED (ICD-244.9) DYSPEPSIA (ICD-536.8) EXCESSIVE MENSTRUATION (ICD-626.2) GANGLION CYST (ICD-727.43) HIDRADENITIS SUPPURATIVA (ICD-705.83) Hx of TOBACCO DEPENDENCE (ICD-305.1) MIGRAINE, UNSPEC., W/O INTRACTABLE MIGRAINE (ICD-346.90) FIBROADENOSIS, BREAST (ICD-610.2) BACK PAIN, LOW (ICD-724.2)  Past Surgical History: Last updated: 05/05/2006 Cryotherapy  plantar warts (feet) - 02/12/2000, Exercise Treadmill 11/11/04 no ischemia - 11/27/2004, Hgb = 14.0, MCV = 104 - 07/15/2004, I & D of sebaceous cyst (face) - 12/07/1999, Laparoscopic BTL (Dr. Okey Dupre) - 01/06/2002, Lipoma excision (L popliteal fossa) - 07/28/2000, Norplant removal (Dr. Ezzard Standing) - 12/06/2001, Pelvic U/S: L hemorr. cyst, small fibroid - 06/07/2003, Right L4-5, L5-S1 facet injections - 01/07/2004, Right L5-S1 epidural injection-no relief - 01/07/2004, Right nose lesion biopsy (Dr. Joseph Art) - 10/07/2003, Thyroidectomy -, TSH = - 08/26/2004, TSH = 0.124 (decreased Synthroid from 225 to 150 mcg daily) - 05/06/2004  Family History: Last updated: 07/13/2006 Brain CA in uncle, dad MI at 71, Diabetes and HTN., Son: depression, ADHD, migraines   mother: living, Type II DM  Social History: Last updated: 10/30/2008 smoker  ; On disability. ; Has 1 daughter Elainna Eshleman) and 1 son Marlinda Mike [place]).  ; social alchol , no drugs. No caffeine.  Risk Factors: Exercise: no (06/12/2008)  Risk Factors: Smoking Status: quit (05/09/2009) Packs/Day: 1.5 (10/30/2008)  Review of Systems       The patient complains of weight gain.  The patient denies anorexia, decreased hearing, chest pain, syncope, dyspnea on exertion, peripheral edema, headaches, hemoptysis, abdominal pain, hematochezia, and severe indigestion/heartburn.    Physical Exam  General:  alert, well-developed, and well-nourished.   Head:  normocephalic and atraumatic.   Mouth:  good dentition and pharynx pink and moist.   Neck:  supple, full ROM, and no masses.   Lungs:  normal respiratory effort, no accessory muscle use, and normal breath sounds.   Heart:  normal rate, regular rhythm, no murmur, and no gallop.   Abdomen:  soft, non-tender, and no masses.   Genitalia:  Normal introitus for age, no external lesions, no vaginal discharge, mucosa pink and moist, no vaginal or cervical lesions, no vaginal atrophy, no friaility or hemorrhage, normal uterus  size and position, no adnexal masses or tenderness Extremities:  Bilateral swelling c/w lymhedema   Foot/Ankle Exam  Foot Exam:    1cc Kenalog and 1 cc Lidocaine injected into plantar fascia bilaterally after marking landmarks, finding point of maximal pain and spraying with cold spray under aseptic technique.  Band-aid applied---pt. tolerated well.   Impression & Recommendations:  Problem # 1:  SCREENING FOR MALIGNANT NEOPLASM OF THE CERVIX (ICD-V76.2)  Orders: Pap Smear-[facility] ([address]11-91478)  Problem # 2:  NECK AND BACK PAIN (ICD-723.1)  The following medications were removed from the medication list:    Vicodin 5-500 Mg Tabs (Hydrocodone-acetaminophen) .Marland Kitchen... 1 by mouth three times a day as needed pain.  does make drowsy Her updated medication list for this problem includes:    Percocet 5-325 Mg Tabs (Oxycodone-acetaminophen) .Marland Kitchen... 1-2 by mouth q 4-6 hrs as needed pain  Problem # 3:  PLANTAR FASCIITIS, BILATERAL (ICD-728.71) s/p injection---do not recommend further injection---stretching and ice recommended and reviewed with pt.  Problem # 4:  DEPRESSIVE DISORDER, NOS (ICD-311)  The following medications were removed from the medication list:    Klonopin 0.5 Mg Tabs (Clonazepam) .Marland Kitchen... 1 tab by mouth two times a day as needed anxiety.  can cause drowsiness    Cymbalta 30 Mg Cpep (Duloxetine hcl) .Marland KitchenMarland KitchenMarland KitchenMarland Kitchen 1  by mouth once daily for 7 days then increase to 2 daily. Her updated medication list for this problem includes:    Celexa 40 Mg Tabs (Citalopram hydrobromide) .Marland Kitchen... 1 by mouth daily during affected wk    Valium 5 Mg Tabs (Diazepam) .Marland Kitchen... 1 by mouth three times a day as needed  Problem # 5:  HYPOTHYROIDISM, UNSPECIFIED (ICD-244.9)  Her updated medication list for this problem includes:    Synthroid 125 Mcg Tabs (Levothyroxine sodium) .Marland Kitchen... 2 tabs by mouth each morning  Orders: TSH-[facility] ([id number])  Problem # 6:  CONTACT OR EXPOSURE TO OTHER VIRAL DISEASES  (ICD-V01.79)  Orders: GC/Chlamydia-[facility] (87591/87491) Wet Prep- [facility] (09811) HIV-[facility] ([id number]) RPR-[facility] ([id number])  Problem # 7:  IRREGULAR MENSES (ICD-626.4)  Has fibroid--offered gynecology services but she will wait to see if she wants to persue this  Problem # 8:  COPD, MILD (ICD-496)  Her updated medication list for this problem includes:    Ventolin Hfa 108 (90 Base) Mcg/act Aers (Albuterol sulfate) .Marland Kitchen... 2 puffs as needed up to four times daily  Complete Medication List: 1)  Prilosec 40 Mg Cpdr (Omeprazole) .... Take 1 pill by mouth twice daily 2)  Synthroid 125 Mcg Tabs (Levothyroxine sodium) .... 2 tabs by mouth each morning 3)  Simvastatin 40 Mg Tabs (Simvastatin) .Marland Kitchen.. 1 by mouth at bedtime for cholesterol 4)  Celexa 40 Mg Tabs (Citalopram hydrobromide) .Marland Kitchen.. 1 by mouth daily during affected wk 5)  Ventolin Hfa 108 (90 Base) Mcg/act Aers (Albuterol sulfate) .... 2 puffs as needed up to four times daily 6)  Percocet 5-325 Mg Tabs (Oxycodone-acetaminophen) .Marland Kitchen.. 1-2 by mouth q 4-6 hrs as needed pain 7)  Valium 5 Mg Tabs (Diazepam) .Marland Kitchen.. 1 by mouth three times a day as needed  Other Orders: U Preg-[facility] (81025) [facility] - Est  40-64 yrs ([address])  Patient Instructions: 1)  Please schedule a follow-up appointment in 2 months.  Prescriptions: SIMVASTATIN 40 MG TABS (SIMVASTATIN) 1 by mouth at bedtime for cholesterol  #30 x 3   Entered and Authorized by:   Tinnie Gens MD   Signed by:   Tinnie Gens MD on 05/09/2009   Method used:   Electronically to        [facility] [id number] (retail)       [address] [place], [place]  [address]       Ph: [phone]       Fax: [phone]   RxID:   [id number] SYNTHROID 125 MCG  TABS (LEVOTHYROXINE SODIUM) 2 tabs by mouth each morning  #60 x 3   Entered and Authorized by:   Tinnie Gens MD   Signed by:   Tinnie Gens MD on 05/09/2009   Method used:   Electronically to        [facility] [id number] (retail)       [address] [place], [place]  [address]       Ph: [phone]       Fax: [phone]   RxID:   [id number] PRILOSEC 40 MG  CPDR (OMEPRAZOLE) take 1 pill by mouth twice daily  #60 x 3   Entered and Authorized by:   Tinnie Gens MD   Signed by:   Tinnie Gens MD on 05/09/2009   Method used:   Electronically to  [facility] [address] (retail)       [address] [place], [place]  [address]       Ph: [phone]       Fax: [phone]   RxID:   [id number] VALIUM 5 MG TABS (DIAZEPAM) 1 by mouth three times a day as needed  #30 x 2   Entered and Authorized by:   Tinnie Gens MD   Signed by:   Tinnie Gens MD on 05/09/2009   Method used:   Print then Give to Patient   RxID:   [id number] PERCOCET 5-325 MG TABS (OXYCODONE-ACETAMINOPHEN) 1-2 by mouth q 4-6 hrs as needed pain  #30 x 0   Entered and Authorized by:   Tinnie Gens MD   Signed by:   Tinnie Gens MD on 05/09/2009   Method used:   Print then Give to Patient   RxID:   [id number] VENTOLIN HFA 108 (90 BASE) MCG/ACT AERS (ALBUTEROL SULFATE) 2 puffs as needed up to four times daily  #1 x 3   Entered and Authorized by:   Tinnie Gens MD   Signed by:   Tinnie Gens MD on 05/09/2009   Method used:   Electronically to        [facility] [address] (retail)       [address] [place], [place]  [address]       Ph: [phone]       Fax: [phone]   RxID:   [id number] CELEXA 40 MG TABS (CITALOPRAM HYDROBROMIDE) 1 by mouth daily during affected wk  #15 x 2   Entered and Authorized by:   Tinnie Gens MD   Signed by:   Tinnie Gens MD on 05/09/2009   Method used:   Electronically to        [facility] [address] (retail)       [address] [place], [place]  [address]       Ph: [phone]       Fax: [phone]   RxID:   [id number]   Laboratory Results    Urine Tests  Date/Time Received: May 09, 2009 10:37 AM  Date/Time Reported: May 09, 2009 10:45 AM     Urine HCG: negative Comments: ...............test performed by......Marland KitchenBonnie A. [place], MLS (ASCP)cm  Date/Time Received: May 09, 2009 11:25 AM  Date/Time Reported: May 09, 2009 11:36 AM   [employer] Source: vag WBC/hpf: 5-10 Bacteria/hpf: 3+  Cocci Clue cells/hpf: many  Positive whiff Yeast/hpf: few Trichomonas/hpf: none Comments: ...............test performed by......Marland KitchenBonnie A. [place], MLS (ASCP)cm

## 2010-04-09 NOTE — Progress Notes (Signed)
  Phone Note Call from Patient   Caller: Patient Call For: [phone] Summary of Call: Per pt, Dr. Mauricio Po want patient to have home care for now.  Pt is requesting that Dr. Madolyn Frieze complete necessary paperwork to have serivice started as soon as possible, pt has been given a new rx for anxiety and depression.  Need assistant btwn 4-9 pm. Initial call taken by: Abundio Miu,  February 17, 2010 9:51 AM    I saw patient during her recent inpatient hospital stay.  During that time we attempted to get SW to arrange home health nursing for meds compliance and review of home situation.  I believe the patient may have misinterpreted this as an endorsement of her request for a home aide, which I do not believe is medically indicated.  Paula Compton MD  February 18, 2010 8:39 AM

## 2010-04-09 NOTE — Progress Notes (Signed)
  Phone Note Call from Patient   Caller: Patient Summary of Call: Pt referred to Urgent Care for yeast infection.  Initial call taken by: Abundio Miu,  February 09, 2010 1:45 PM
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
## 2010-04-09 NOTE — Assessment & Plan Note (Signed)
Summary: Back pain, depression, smoking   Vital Signs:  Patient profile:   45 year old female Height:      68 inches Weight:      235.6 pounds BMI:     35.95 Temp:     99.7 degrees F oral Pulse rate:   71 / minute BP sitting:   122 / 87  (left arm) Cuff size:   regular  Vitals Entered By: Jimmy Footman, CMA (December 18, 2009 1:36 PM) CC: f/u back pain. Not getting better Is Patient Diabetic? No Pain Assessment Patient in pain? yes     Location: back Intensity: 9 Type: sharp   Primary Provider:  Ancil Boozer  MD  CC:  f/u back pain. Not getting better.  History of Present Illness: 1. Back pain Poorly controlled and is limiting. Same right lumbar back pain. Recommended Tylenol for baseline pain at last visit but has been taking inconsistently, only when the pain is severe. Has also been taking "the other medication you told me to take" (diclofenac?). Takes oxycodone once daily or every other day. Patient would like prednisone spinal injection for pain.  Says has been doing back exercises every night. Pain radiates from right lumbar area down the back of her right thigh  2. Depression Started Cymbalta last time and symptoms are about the same.  Not sleeping well and with some crying spells.  Attributes symptoms to poorly controlled back pain.  Valium helps.  3. Smoker Still continues to smoke. Says that when she tries to lose weight, she smokes more.  Allergies: No Known Drug Allergies  Social History: Smokes 1.5 ppd.  Tried stopping but stressors, increased panic attacks, & worsening back pain caused her to start again. Occ alcohol, denies drugs.  On disability for congenital lymphedema. 1 daughter Eda Magnussen) and 1 son Marlinda Mike [place]). Son lives with her.   Review of Systems       Denies congestion, sore throat, runny nose, difficulty hearing, fevers, SOB/dyspnea.   Physical Exam  General:  sitting in chair NAD initially but appears to be in  more pain during interview Ears:   Neck:  thick neck; no masses palpable  Lungs:  CTAB, no w/r/r, good aeration Msk:  able to bend forward about 30-45%; no muscle spasms in lumbar area with spine flexion or extension; tenderness to palpation in right lumbar back; no midline tenderness; straight leg raises (30 degrees with left leg raises & 15 degrees wtih right leg) causes pain in right lumbar area Pulses:  1+ DP/PT pulses Extremities:  3+ edema about equally bilaterally Neurologic:  hesitant and slow gait; sensation intact lower extremities (legs and feet) bilaterally Psych:  patient appropriate, does not appear anxious/depressed Additional Exam:  Tympanogram is normal bilaterally; no evidence of effusions or otitis media.    Impression & Recommendations:  Problem # 1:  BACK PAIN, LOW (ICD-724.2)  Have been having difficulty controlling pain, which may be possible mild right disk protrusion impinging on S1 exacerbated by body habitus & inactivity. Feel patient is not being regular with medications. Will not do anything new today, but reiterate and clarify plans from the last visit: Tylenol three times a day scheduled for baseline pain control & diclofenac & oxycodone as needed for breakthrough pain. Since patient reports only needing oxycodone every day or every other day, will only prescribe that many to last until her next follow-up in 3 months and will not give any refills until then. Would like to stop this med completely  in the future. What patient would really like is prednisone injection. However, adverse to giving this since it only has only short-term relief at the least.  Patient is not making any of the non-medical recommendations asked of her: losing weight (weight is up 2 pounds from last visit a couple of months ago), refusing PT, not exercising.  Patient has been put on a host of medications in the past: neuropathic pain (Gabapentin), NSAID (naproxen, ibuprofen, voltaren gel,  diclofenac), anti-depressants (Wellbutrin, Celexa, Lexapro, clonazepam). Nothing has helped that much. May consider TCA (amitriptyline) or venlafaxine in the future. But feel medical management is not solution. Feel she would benefit most by losing weight, increasing activity, and stopping smoking, things she is not willing to do.  Will refer her to pain clinic to see if they could give recs in how to better manage her pain.   Her updated medication list for this problem includes:    Diclofenac Sodium 75 Mg Tbec (Diclofenac sodium) .Marland Kitchen... 1 by mouth two times a day for pain    Oxycodone Hcl 5 Mg Tabs (Oxycodone hcl) .Marland Kitchen... Take 1-2 tablets every 6 hrs for breakthrough pain.    Lortab 10-500 Mg Tabs (Hydrocodone-acetaminophen)  Orders: Comp Met-[facility] ([id number]) [facility]- Est Level  3 (53664) Pain Clinic Referral (Pain)  Problem # 2:  DEPRESSIVE DISORDER, NOS (ICD-311) Will continue Cymbalta for now. Asked her to make appointment and see Dr. Pascal Lux. She said she is willing to do this.   Her updated medication list for this problem includes:    Valium 5 Mg Tabs (Diazepam) .Marland Kitchen... 1 by mouth three times a day as needed    Cymbalta 30 Mg Cpep (Duloxetine hcl) .Marland Kitchen... Take one tablet daily.    Ativan 2 Mg Tabs (Lorazepam) .Marland Kitchen... Take 1 tablet a day as needed for your nerves.  Orders: [facility]- Est Level  3 (99213) TSH-[facility] ([id number])  Problem # 3:  TOBACCO DEPENDENCE (ICD-305.1) Not willing to quit at this time.  Problem # 4:  HYPOTHYROIDISM, UNSPECIFIED (ICD-244.9) Will re-check TSH to see if this may be contributing to depression.  Her updated medication list for this problem includes:    Levothroid 150 Mcg Tabs (Levothyroxine sodium) .Marland Kitchen... 1 by mouth once daily for thyroid  Orders: TSH-[facility] ([id number])  Complete Medication List: 1)  Prilosec 40 Mg Cpdr (Omeprazole) .... Take 1 pill by mouth twice daily 2)  Ventolin Hfa 108 (90 Base) Mcg/act Aers (Albuterol sulfate) .... 2 puffs as needed up to  four times daily 3)  Valium 5 Mg Tabs (Diazepam) .Marland Kitchen.. 1 by mouth three times a day as needed 4)  Levothroid 150 Mcg Tabs (Levothyroxine sodium) .Marland Kitchen.. 1 by mouth once daily for thyroid 5)  Diclofenac Sodium 75 Mg Tbec (Diclofenac sodium) .Marland Kitchen.. 1 by mouth two times a day for pain 6)  Oxycodone Hcl 5 Mg Tabs (Oxycodone hcl) .... Take 1-2 tablets every 6 hrs for breakthrough pain. 7)  Cymbalta 30 Mg Cpep (Duloxetine hcl) .... Take one tablet daily. 8)  Ativan 2 Mg Tabs (Lorazepam) .... Take 1 tablet a day as needed for your nerves. 9)  Lortab 10-500 Mg Tabs (Hydrocodone-acetaminophen)  Other Orders: Direct LDL-[facility] ([id number])  Patient Instructions: 1)  Please take Tylenol XS three times a day regardless of whether you have pain. Please take diclofenac (antiinflammatory) or oxycodone for breakthrough pain up to two times a day.  2)  Please also continue to take your Cymbalta once a day. 3)  Please follow-up with me in one month  when we'll reassess your pain.  4)  Please try warm compresses for your back.  5)  Please also try to walk or exercise 30 minutes a day.  6)  Please try to lose weight by eating less fats and drinking less sodas.  Prescriptions: OXYCODONE HCL 5 MG TABS (OXYCODONE HCL) Take 1-2 tablets every 6 hrs for breakthrough pain.  #30 x 0   Entered and Authorized by:   Priscella Mann MD   Signed by:   Lucianne Muss Park MD on 12/18/2009   Method used:   Print then Give to Patient   RxID:   [id number] CYMBALTA 30 MG CPEP (DULOXETINE HCL) Take one tablet daily.  #30 x 1   Entered and Authorized by:   Priscella Mann MD   Signed by:   Lucianne Muss Park MD on 12/18/2009   Method used:   Print then Give to Patient   RxID:   [id number] DICLOFENAC SODIUM 75 MG TBEC (DICLOFENAC SODIUM) 1 by mouth two times a day for pain  #60 x 1   Entered and Authorized by:   Priscella Mann MD   Signed by:   Lucianne Muss Park MD on 12/18/2009   Method used:   Print then Give to Patient   RxID:    [id number] VALIUM 5 MG TABS (DIAZEPAM) 1 by mouth three times a day as needed  #30 x 0   Entered and Authorized by:   Priscella Mann MD   Signed by:   Lucianne Muss Park MD on 12/18/2009   Method used:   Print then Give to Patient   RxID:   [id number]   Appended Document: Back pain, depression, smoking -CMET, including LFTs, normal. -LDL improved from before. Was 130 now 103. -TSH is high 5.042. TSH has been really elevated in the double digits and low, below 1. Will ask patient to make appointment just to have this addressed.

## 2010-04-09 NOTE — Letter (Signed)
Summary: Probation Letter  [facility]  [address]   [place], [place] [address]   Phone: [phone]  Fax: [phone]    05/28/2009  [name] [address] [place], [place]  [address]  Dear [name],  With the goal of better serving all our patients the [facility] is following each patient's missed appointments.  You have missed at least 3 appointments with our practice.If you cannot keep your appointment, we expect you to call at least 24 hours before your appointment time.  Missing appointments prevents other patients from seeing [place] and makes it difficult to provide you with the best possible medical care.      1.   If you miss one more appointment, we will only give you limited medical services. This means we will not call in medication refills, complete a form, or make a referral for you except when you are here for a scheduled office visit.    2.   If you miss 2 or more appointments in the next year, we will dismiss you from our practice.    Our office staff can be reached at [phone] Monday through Friday from 8:30 a.m.-5:00 p.m. and will be glad to schedule your appointment as necessary.    Thank you.   [facility]  Appended Document: Probation Letter cert mailed  Appended Document: Probation Letter Certified letter returned. [name] 06/30/09 2:23 pm
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
## 2010-04-09 NOTE — Progress Notes (Signed)
----   Converted from flag ---- ---- 01/26/2010 4:41 PM, Priscella Mann MD wrote: sorry deleted your other document regarding steroid injection. pt and i discussed this several times. only proven to have very short-term relief, if at all. do not think pt is appropriate for this therapy. ------------------------------  message left to return call. Theresia Lo RN  January 27, 2010 9:00 AM   Called pt to inform of the above as well as to discuss refills on narcotics and thyroid meds.  Pts sts that the girls up front misunderstood and that she does not want anymore narcotics.  She would actually like to stop all narcotics because they do not help.  She sts that she would really like to have an injection.  Advised of the above note but still wants.  She will make appt to come in and discuss.  Pt also wanted me to let Dr.Oh Park know that she is very greatful for everything she is doing................................................ Shanda Bumps Uh [facility] January 28, 2010 10:00 AM

## 2010-04-09 NOTE — Miscellaneous (Signed)
Summary: Pt missed pain clinic appt on 02/02/2010  Received notification that patient missed initial appointment @ [facility] on 02/02/2010.
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
## 2010-04-09 NOTE — Assessment & Plan Note (Signed)
Summary: cpe,df   Vital Signs:  Patient profile:   45 year old female Height:      68 inches Weight:      231 pounds BMI:     35.25 BSA:     2.17 Temp:     99.3 degrees F Pulse rate:   81 / minute BP sitting:   144 / 91  Vitals Entered By: Jone Baseman CMA (September 04, 2009 11:40 AM) CC: back pain, f/u cholesterol, thyroid Is Patient Diabetic? Yes Pain Assessment Patient in pain? yes     Location: back Intensity: 10   Primary Care Provider:  Ancil Boozer  MD  CC:  back pain, f/u cholesterol, and thyroid.  History of Present Illness: back pain: persists.  reports neurontin isn't helping (though i'm not sure she is taking it).  using percocets as needed but doens't like taking more than one at a time.  reports she cannot rest due to pain.  cannot sit  without discomfort.  pain is primarily in right buttock and leg.  she reports numbness at times in the leg and also weakness but she thinks this weakness is due to pain.  she tried the exercises she was given but states "they hurt worse" so she quit.  she refuses physical therapy.  she also refuses prednisone burst.  she does report that in addition to her back pain though for the past 3-4 days she's had a fever.  her only other symptom is a cough which is somewhat chronic.  fever she reports being at home to as high as 102.   cholesterol: states she has been taking simvastatin 80mg .  denies muscle pain.    thyroid: reports she has been compliant with her medication.  denies palpitations, denies big weight changes/  has noticed some sweating recently  Habits & Providers  Alcohol-Tobacco-Diet     Tobacco Status: current     Tobacco Counseling: to quit use of tobacco products     Cigarette Packs/Day: 0.5  Current Medications (verified): 1)  Prilosec 40 Mg  Cpdr (Omeprazole) .... Take 1 Pill By Mouth Twice Daily 2)  Lipitor 40 Mg Tabs (Atorvastatin Calcium) .Marland Kitchen.. 1 By Mouth At Bedtime For Cholesterol 3)  Ventolin Hfa 108 (90  Base) Mcg/act Aers (Albuterol Sulfate) .... 2 Puffs As Needed Up To Four Times Daily 4)  Percocet 5-325 Mg Tabs (Oxycodone-Acetaminophen) .Marland Kitchen.. 1-2 By Mouth Q 4-6 Hrs As Needed Pain 5)  Valium 5 Mg Tabs (Diazepam) .Marland Kitchen.. 1 By Mouth Three Times A Day As Needed 6)  Levothyroxine Sodium 100 Mcg Tabs (Levothyroxine Sodium) .Marland Kitchen.. 1 By Mouth Once Daily For Thyroid 7)  Diclofenac Sodium 75 Mg Tbec (Diclofenac Sodium) .Marland Kitchen.. 1 By Mouth Two Times A Day For Pain 8)  Neurontin 300 Mg Caps (Gabapentin) .Marland Kitchen.. 1 Cap By Mouth At Bedtime X 3 Days Then Two Times A Day X 3 Days Then Three Times A Day  Allergies (verified): No Known Drug Allergies  Past History:  Past medical, surgical, family and social histories (including risk factors) reviewed for relevance to current acute and chronic problems.  Past Medical History: Reviewed history from 10/30/2008 and no changes required. 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, H/o hospitalization for candidal intertrigo (3/03),  H/o hospitalization for hypersensitivity rxn(5/06),  H/o hospitalization for LE cellulitis (3/02),  Multinodular goiter,  Plantar fasciitis,  Premenstrual dysphoric disorder, S/p SVD x 2 smoker ?OA lower extremities  Current Problems:  DEPRESSIVE DISORDER, NOS (ICD-311) LYMPHEDEMA (ICD-457.1) HYPOTHYROIDISM, UNSPECIFIED (ICD-244.9) DYSPEPSIA (ICD-536.8) EXCESSIVE MENSTRUATION (ICD-626.2) GANGLION CYST (ICD-727.43) HIDRADENITIS SUPPURATIVA (ICD-705.83) Hx of TOBACCO DEPENDENCE (ICD-305.1) MIGRAINE, UNSPEC., W/O INTRACTABLE MIGRAINE (ICD-346.90) FIBROADENOSIS, BREAST (ICD-610.2) BACK PAIN, LOW (ICD-724.2)  Past Surgical History: Cryotherapy plantar warts (feet) - 02/12/2000,  Laparoscopic BTL (Dr. Okey Dupre) - 01/06/2002,  Lipoma excision (L popliteal fossa) - 07/28/2000,  Norplant  removal (Dr. Ezzard Standing) - 12/06/2001, Pelvic U/S:  L hemorr. cyst, small fibroid - 06/07/2003,  Right L4-5, L5-S1 facet injections - 01/07/2004,  Right L5-S1 epidural injection-no relief - 01/07/2004,  Right nose lesion biopsy (Dr. Joseph Art) - 10/07/2003,  Thyroidectomy   Family History: Reviewed history from 07/13/2006 and no changes required. Brain CA in uncle, dad MI at 54, Diabetes and HTN., Son: depression, ADHD, migraines   mother: living, Type II DM  Social History: Reviewed history from 10/30/2008 and no changes required. smoker  ; On disability. ; Has 1 daughter Shamon Lobo) and 1 son Marlinda Mike Thibodaux).  ; social alchol , no drugs. No caffeine.Packs/Day:  0.5  Review of Systems       per HPI  Physical Exam  General:  Well-developed, overweight female.  tearful. refuses to sit down and stands during entire visit holding onto desk. Lungs:  normal respiratory effort, no accessory muscle use, and normal breath sounds.   Heart:  normal rate, regular rhythm, no murmur, and no gallop.   Abdomen:  soft, non-tender,nondistended Msk:  No gross deformity, swelling, or bruising. refuses to let me touch her back due to pain.  refuses to sit down to perform straight leg raise.  antalgic gait.  will not let me perform strength testing or DTRS due to not willing to sit down sensation intact bilateral lower extremities to light touch Psych:  agitated, tearful   Impression & Recommendations:  Problem # 1:  BACK PAIN, LOW (ICD-724.2) Assessment Deteriorated  given severity would recommend prednisone burst (patient refuses) formal physical therapy (patient refuses) given fever will do some workup - sed rate, cbc with diff, ua.   continue neurontin, percocet and diclofenac shot of toradol in office today  f/u 1 week to review labs.  could consider imaging (though she does not want surgical intervention) or orthopedics referral for ? of epidural steroid injections.   she refuses to return to  sports medicine at this time for follow up  Her updated medication list for this problem includes:    Percocet 5-325 Mg Tabs (Oxycodone-acetaminophen) .Marland Kitchen... 1-2 by mouth q 4-6 hrs as needed pain    Diclofenac Sodium 75 Mg Tbec (Diclofenac sodium) .Marland Kitchen... 1 by mouth two times a day for pain  Orders: [facility]- Est  Level 4 (99214) Ketorolac-Toradol 15mg  (Z6109)  Problem # 2:  HYPERLIPIDEMIA, BORDERLINE (ICD-272.4) Assessment: Unchanged  change to lipitor given new warnings on simvastatin and to having maxed out therapy for high dose less effective statins check dldl today  Her updated medication list for this problem includes:    Lipitor 40 Mg Tabs (Atorvastatin calcium) .Marland Kitchen... 1 by mouth at bedtime for cholesterol  Orders: Direct LDL-[facility] (60454-09811) [facility]- Est  Level 4 (91478)  Labs Reviewed: SGOT: 12 (07/22/2009)   SGPT: 10 (07/22/2009)   HDL:84 (07/22/2009), 62 (01/31/2008)  LDL:158 (07/22/2009), 150 (01/31/2008)  Chol:264 (07/22/2009), 233 (01/31/2008)  Trig:111 (07/22/2009), 105 (01/31/2008)  Problem # 3:  HYPOTHYROIDISM, UNSPECIFIED (ICD-244.9) Assessment: Unchanged reports compliance with medication.  check TSH today.   Her updated medication list for this problem includes:    Levothyroxine Sodium 100 Mcg Tabs (Levothyroxine sodium) .Marland Kitchen... 1 by mouth once daily for thyroid  Orders: TSH-[facility] (04540-98119) [facility]- Est  Level 4 (14782)  Problem # 4:  OTHER ABNORMAL GLUCOSE (ICD-790.29) Assessment: Comment Only while checking labs will follow up for h/o elevated glucose Orders: A1C-[facility] (95621) [facility]- Est  Level 4 (30865)  Complete Medication List: 1)  Prilosec 40 Mg Cpdr (Omeprazole) .... Take 1 pill by mouth twice daily 2)  Lipitor 40 Mg Tabs (Atorvastatin calcium) .Marland Kitchen.. 1 by mouth at bedtime for cholesterol 3)  Ventolin Hfa 108 (90 Base) Mcg/act Aers (Albuterol sulfate) .... 2 puffs as needed up to four times daily 4)  Percocet 5-325 Mg Tabs (Oxycodone-acetaminophen) .Marland Kitchen.. 1-2 by  mouth q 4-6 hrs as needed pain 5)  Valium 5 Mg Tabs (Diazepam) .Marland Kitchen.. 1 by mouth three times a day as needed 6)  Levothyroxine Sodium 100 Mcg Tabs (Levothyroxine sodium) .Marland Kitchen.. 1 by mouth once daily for thyroid 7)  Diclofenac Sodium 75 Mg Tbec (Diclofenac sodium) .Marland Kitchen.. 1 by mouth two times a day for pain 8)  Neurontin 300 Mg Caps (Gabapentin) .Marland Kitchen.. 1 cap by mouth at bedtime x 3 days then two times a day x 3 days then three times a day  Other Orders: CBC w/Diff-[facility] (78469) Sed Rate (ESR)-[facility] (62952) Urinalysis-[facility] (00000)  Patient Instructions: 1)  Please follow up next week for your back. 2)  We will check some labs today - if they are abnormal we will call you. 3)  If your pain continues as bad as it is now after we have gotten labs that are reassurring, etc we will consider orthopedics referral and possibly imaging of your back.  4)  I strongly encourage physical therapy if you want to get better. Prescriptions: LIPITOR 40 MG TABS (ATORVASTATIN CALCIUM) 1 by mouth at bedtime for cholesterol  #30 x 3   Entered and Authorized by:   Ancil Boozer  MD   Signed by:   Ancil Boozer  MD on 09/04/2009   Method used:   Electronically to        [facility] [id number] (retail)       [address] [place], [place]  [address]       Ph: [phone]       Fax: [phone]   RxID:   [id number]    Medication Administration  Injection # 1:    Medication: Ketorolac-Toradol 15mg     Diagnosis: BACK PAIN, LOW (ICD-724.2)    Route: IM    Site: RUOQ gluteus    Exp Date: 05/07/2011    Lot #: [id number]    Mfr: hospira    Comments: 30mg     Patient tolerated injection without complications    Given by: Jone Baseman CMA (September 04, 2009 1:57 PM)  Orders Added: 1)  CBC w/Diff-[facility] [85025] 2)  Sed Rate (ESR)-[facility] [85651] 3)  TSH-[facility] [63016-01093] 4)  Urinalysis-[facility] [00000] 5)  A1C-[facility] [83036] 6)  Direct LDL-[facility] [23557-32202] 7)  [facility]- Est  Level 4 [54270] 8)   Ketorolac-Toradol 15mg  [J1885]   Laboratory Results   Urine Tests  Date/Time Received: September 04, 2009 12:13 PM  Date/Time Reported: September 04, 2009 1:59 PM   Routine Urinalysis   Color: yellow Appearance: Cloudy Glucose: negative   (Normal Range: Negative) Bilirubin: negative   (Normal Range:  Negative) Ketone: negative   (Normal Range: Negative) Spec. Gravity: >=1.030   (Normal Range: 1.003-1.035) Blood: large   (Normal Range: Negative) pH: 5.5   (Normal Range: 5.0-8.0) Protein: 30   (Normal Range: Negative) Urobilinogen: 0.2   (Normal Range: 0-1) Nitrite: negative   (Normal Range: Negative) Leukocyte Esterace: trace   (Normal Range: Negative)  Urine Microscopic WBC/HPF: 4-8 RBC/HPF: 1-5 Bacteria/HPF: 3+ predominately cocci with few rods Mucous/HPF: 2+ Epithelial/HPF: >20 with few clue cells Other: 2+ amorphous    Comments: ...............test performed by......Marland KitchenBonnie A. [place], MLS (ASCP)cm   Blood Tests   Date/Time Received: September 04, 2009 12:13 PM  Date/Time Reported: September 04, 2009 1:59 PM September 04, 2009 3:16 PM   HGBA1C: 5.1%   (Normal Range: Non-Diabetic - 3-6%   Control Diabetic - 6-8%) SED rate: 13 mm/hr  Comments: ...............test performed by......Marland KitchenBonnie A. [place], MLS (ASCP)cm    poor urine catch.  doesn't seem consistent with UTI.  will monitor symptoms with patient. BV should not cause her symptoms.  will not send for culture - with this many epithelial cells it will grow a contaminant.  A1C WNL Sed rate also WNL - unlikely inflammatory of infectious process at this time. will await remainder of labs. Ancil Boozer  MD  September 04, 2009 3:15 PM

## 2010-04-11 DIAGNOSIS — Z8742 Personal history of other diseases of the female genital tract: Secondary | ICD-10-CM | POA: Insufficient documentation

## 2010-04-11 DIAGNOSIS — N760 Acute vaginitis: Secondary | ICD-10-CM | POA: Insufficient documentation

## 2010-04-11 DIAGNOSIS — L209 Atopic dermatitis, unspecified: Secondary | ICD-10-CM | POA: Insufficient documentation

## 2010-04-11 NOTE — Assessment & Plan Note (Addendum)
Controlled. Patient's mood seems to really affect back pain. Not endorsing any pain today. Taking oxycodone 7.5mg  daily for this pain. Patient denies taking more than this. Will re-prescribe. Patient signed pain contract today.

## 2010-04-11 NOTE — Assessment & Plan Note (Signed)
Patient mood was good today, for the first time out of several visits with me. Will continue Risperdal, which she started taking after D/C from hospital 02/2010. Patient's first appointment with [facility] 04/21/2010. Will follow-up on this visit.

## 2010-04-11 NOTE — Assessment & Plan Note (Addendum)
Patient has stopped smoking because it was contributing to her dizziness premenstrual symptoms.

## 2010-04-11 NOTE — Assessment & Plan Note (Signed)
Hospitalized for this in Decmeber 2011. But now resolved. Seems to come and go with periods. Just a bad episode in December. Steroid cream helps. Will refill.

## 2010-04-11 NOTE — Assessment & Plan Note (Addendum)
Currently controlled. Patient's Levothyroxine lowered to from after hospital discharge in December 2011 2/2 concern for noncompliance. Patient reports compliance TSH & free T4 WNL today. Continue current levothyroxine dose.

## 2010-04-11 NOTE — Assessment & Plan Note (Addendum)
Yeast infection. Will send Rx for fluconazole and notify patient.

## 2010-04-14 ENCOUNTER — Telehealth: Payer: Self-pay | Admitting: *Deleted

## 2010-04-15 ENCOUNTER — Telehealth: Payer: Self-pay | Admitting: Family Medicine

## 2010-04-15 DIAGNOSIS — E039 Hypothyroidism, unspecified: Secondary | ICD-10-CM

## 2010-04-15 NOTE — Miscellaneous (Signed)
Summary: Pain contract  Pain contract   Imported By: De Nurse 04/10/2010 12:33:17  _____________________________________________________________________  External Attachment:    Type:   Image     Comment:   External Document

## 2010-04-15 NOTE — Progress Notes (Signed)
Summary: Phone Synthroid dose  Phone Note Call from Patient Call back at [phone]   Caller: Patient Summary of Call: pt called to say she is taking Synthroid - instead of Initial call taken by: De Nurse,  April 09, 2010 9:30 AM     In the hospital, she was discharged with Synthroid . And this is the one I gave an Rx for at the last visit. Her thyroid hormone levels are now good (within normal limits). So she is to continue taking the current dose she is on. Thank you Angelique Blonder.
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
## 2010-04-16 ENCOUNTER — Telehealth: Payer: Self-pay | Admitting: Family Medicine

## 2010-04-16 MED ORDER — LEVOTHYROXINE SODIUM 175 MCG PO TABS: 175.0000 ug | ORAL_TABLET | Freq: Every day | ORAL | Status: DC

## 2010-04-16 NOTE — Telephone Encounter (Signed)
Patient currently taking Synthroid . Patient had been asked to start taking 125 instead after D/C hospital 02/2010 but has just continued to take her old dose . Since thyroid tests are good on this dose, will continue and refill.

## 2010-04-16 NOTE — Telephone Encounter (Signed)
Called patient and informed of normal thyroid tests and encouraged her to continue taking Synthroid 175.

## 2010-04-17 NOTE — Telephone Encounter (Signed)
Attempted to call patient but no answer and no machine

## 2010-04-17 NOTE — Telephone Encounter (Signed)
Pt informed that her thyroid medicine was was at the pharmacy for pickup.  She then ask if the MD could give her a muscle relaxer medication because she is going to start going back to the gym and knows that she will be sore.  Advised that she should try ibuprofen or tylenol for the pain because after her body gets used to the exercising she should not be sore anymore.  Pt insist that I ask the MD just in case, will forward to MD.

## 2010-04-22 ENCOUNTER — Other Ambulatory Visit: Payer: Self-pay | Admitting: Family Medicine

## 2010-04-22 NOTE — Telephone Encounter (Signed)
Please review and refill

## 2010-04-23 ENCOUNTER — Inpatient Hospital Stay (INDEPENDENT_AMBULATORY_CARE_PROVIDER_SITE_OTHER)
Admission: RE | Admit: 2010-04-23 | Discharge: 2010-04-23 | Disposition: A | Discharge status: Home / Self Care | Payer: Medicaid Other | Source: Ambulatory Visit | Attending: Emergency Medicine | Admitting: Emergency Medicine

## 2010-04-23 DIAGNOSIS — T783XXA Angioneurotic edema, initial encounter: Secondary | ICD-10-CM

## 2010-04-23 DIAGNOSIS — L259 Unspecified contact dermatitis, unspecified cause: Secondary | ICD-10-CM

## 2010-04-23 NOTE — Progress Notes (Signed)
Summary: Phn Msg  Phone Note Call from Patient Call back at [phone]   Reason for Call: Talk to Nurse Summary of Call: needs to speak with RN re: meds & also getting a hysterectomy, was suppose to have info mailed to her? Initial call taken by: Knox Royalty,  April 14, 2010 8:37 AM  Follow-up for Phone Call        Spoke with a female who sts that pt gave him permission to take a message and get info.  He sts that she wants [place] to continue to set up the appt but would also like some info mailed to her to read over.  Will print out info and mail to pt as well as fax notes to [facility] for appt. Follow-up by: Jone Baseman CMA,  April 14, 2010 11:36 AM

## 2010-04-24 ENCOUNTER — Other Ambulatory Visit: Payer: Self-pay | Admitting: Family Medicine

## 2010-04-24 NOTE — Telephone Encounter (Signed)
Received fax refill for lorazepam 2 mg.  Received 10 tabs 04/08/10.  Per note, this seems to be a short term med only until seen by [facility] with appointment on 04/21/10.  Lorazepam refill denied.

## 2010-05-15 ENCOUNTER — Encounter: Payer: Medicaid Other | Admitting: Family Medicine

## 2010-05-19 ENCOUNTER — Other Ambulatory Visit: Payer: Self-pay | Admitting: Family Medicine

## 2010-05-19 DIAGNOSIS — F411 Generalized anxiety disorder: Secondary | ICD-10-CM

## 2010-05-19 LAB — CBC
Hemoglobin: 12.6 g/dL (ref 12.0–15.0)
Hemoglobin: 12.9 g/dL (ref 12.0–15.0)
Hemoglobin: 13.2 g/dL (ref 12.0–15.0)
Hemoglobin: 13.8 g/dL (ref 12.0–15.0)
MCH: 34.8 pg — ABNORMAL HIGH (ref 26.0–34.0)
MCH: 34.9 pg — ABNORMAL HIGH (ref 26.0–34.0)
MCHC: 34.9 g/dL (ref 30.0–36.0)
MCHC: 35.5 g/dL (ref 30.0–36.0)
Platelets: 259 10*3/uL (ref 150–400)
Platelets: 307 10*3/uL (ref 150–400)
RBC: 3.78 MIL/uL — ABNORMAL LOW (ref 3.87–5.11)
RBC: 4.02 MIL/uL (ref 3.87–5.11)
RDW: 11.9 % (ref 11.5–15.5)
WBC: 5 10*3/uL (ref 4.0–10.5)
WBC: 6.9 10*3/uL (ref 4.0–10.5)
WBC: 7.9 10*3/uL (ref 4.0–10.5)

## 2010-05-19 LAB — COMPREHENSIVE METABOLIC PANEL
ALT: 12 U/L (ref 0–35)
AST: 24 U/L (ref 0–37)
Albumin: 3 g/dL — ABNORMAL LOW (ref 3.5–5.2)
CO2: 22 mEq/L (ref 19–32)
Calcium: 8.7 mg/dL (ref 8.4–10.5)
Creatinine, Ser: 0.96 mg/dL (ref 0.4–1.2)
GFR calc Af Amer: >60 mL/min (ref >60)
Sodium: 135 mEq/L (ref 135–145)
Total Protein: 5.5 g/dL — ABNORMAL LOW (ref 6.0–8.3)

## 2010-05-19 LAB — BASIC METABOLIC PANEL
CO2: 23 mEq/L (ref 19–32)
Calcium: 8.5 mg/dL (ref 8.4–10.5)
Calcium: 8.9 mg/dL (ref 8.4–10.5)
Chloride: 108 mEq/L (ref 96–112)
GFR calc Af Amer: >60 mL/min (ref >60)
GFR calc Af Amer: >60 mL/min (ref >60)
GFR calc Af Amer: >60 mL/min (ref >60)
GFR calc non Af Amer: 59 mL/min — ABNORMAL LOW (ref >60)
GFR calc non Af Amer: >60 mL/min (ref >60)
GFR calc non Af Amer: >60 mL/min (ref >60)
Glucose, Bld: 168 mg/dL — ABNORMAL HIGH (ref 70–99)
Potassium: 3.7 mEq/L (ref 3.5–5.1)
Potassium: 3.8 mEq/L (ref 3.5–5.1)
Potassium: 4.1 mEq/L (ref 3.5–5.1)
Sodium: 131 mEq/L — ABNORMAL LOW (ref 135–145)
Sodium: 135 mEq/L (ref 135–145)

## 2010-05-19 LAB — ANTI-NEUTROPHIL ANTIBODY

## 2010-05-19 LAB — CULTURE, BLOOD (ROUTINE X 2): Culture  Setup Time: 201112061446

## 2010-05-19 LAB — HIV ANTIBODY (ROUTINE TESTING W REFLEX): HIV: NONREACTIVE

## 2010-05-19 LAB — DIFFERENTIAL
Basophils Relative: 1 % (ref 0–1)
Eosinophils Absolute: 0.2 10*3/uL (ref 0.0–0.7)
Eosinophils Relative: 7 % — ABNORMAL HIGH (ref 0–5)
Lymphocytes Relative: 35 % (ref 12–46)
Lymphs Abs: 1.5 10*3/uL (ref 0.7–4.0)
Lymphs Abs: 1.7 10*3/uL (ref 0.7–4.0)
Monocytes Absolute: 0.4 10*3/uL (ref 0.1–1.0)
Monocytes Relative: 6 % (ref 3–12)
Monocytes Relative: 9 % (ref 3–12)
Neutro Abs: 1.7 10*3/uL (ref 1.7–7.7)
Neutro Abs: 2.7 10*3/uL (ref 1.7–7.7)
Neutrophils Relative %: 54 % (ref 43–77)

## 2010-05-19 LAB — URINALYSIS, ROUTINE W REFLEX MICROSCOPIC
Glucose, UA: NEGATIVE mg/dL
Hgb urine dipstick: NEGATIVE
Specific Gravity, Urine: 1.017 (ref 1.005–1.030)
pH: 5.5 (ref 5.0–8.0)

## 2010-05-19 LAB — GC/CHLAMYDIA PROBE AMP, URINE
Chlamydia, Swab/Urine, PCR: NEGATIVE
GC Probe Amp, Urine: NEGATIVE

## 2010-05-19 LAB — T.PALLIDUM AB, IGG: T pallidum Antibodies (TP-PA): >8 S/CO — ABNORMAL HIGH (ref <0.90)

## 2010-05-19 LAB — WET PREP, GENITAL
Clue Cells Wet Prep HPF POC: NONE SEEN
Yeast Wet Prep HPF POC: NONE SEEN

## 2010-05-19 LAB — URINE MICROSCOPIC-ADD ON

## 2010-05-19 LAB — URINE CULTURE

## 2010-05-19 LAB — URINALYSIS, MICROSCOPIC ONLY
Leukocytes, UA: NEGATIVE
Nitrite: NEGATIVE
Specific Gravity, Urine: 1.012 (ref 1.005–1.030)
pH: 6 (ref 5.0–8.0)

## 2010-05-19 LAB — TISSUE CULTURE
Culture: NO GROWTH
Gram Stain: NONE SEEN

## 2010-05-19 LAB — HEMOGLOBIN A1C: Hgb A1c MFr Bld: 5.3 % (ref <5.7)

## 2010-05-19 LAB — SEDIMENTATION RATE: Sed Rate: 7 mm/hr (ref 0–22)

## 2010-05-19 LAB — ANA: Anti Nuclear Antibody(ANA): NEGATIVE

## 2010-05-19 LAB — RPR TITER: RPR Titer: 1:8 {titer} — AB

## 2010-05-19 LAB — PREGNANCY, URINE: Preg Test, Ur: NEGATIVE

## 2010-05-19 MED ORDER — LORAZEPAM 2 MG PO TABS: 2.0000 mg | ORAL_TABLET | Freq: Every day | ORAL | Status: DC | PRN

## 2010-05-19 NOTE — Assessment & Plan Note (Signed)
Refill Rx sent in for Ativan (patient gets 10 tablets a month), with 3 RF. Will not fill any more refill requests. Asked patient to make appointment in 3 months.

## 2010-05-20 ENCOUNTER — Other Ambulatory Visit: Payer: Self-pay | Admitting: Family Medicine

## 2010-05-20 ENCOUNTER — Telehealth: Payer: Self-pay | Admitting: Family Medicine

## 2010-05-20 DIAGNOSIS — I890 Lymphedema, not elsewhere classified: Secondary | ICD-10-CM

## 2010-05-20 MED ORDER — ACETAMINOPHEN ER 650 MG PO TBCR: 650.0000 mg | EXTENDED_RELEASE_TABLET | Freq: Four times a day (QID) | ORAL | Status: AC

## 2010-05-20 NOTE — Telephone Encounter (Signed)
Pt sts that her appt @ [facility] was rescheduled until 4.2.12.  She wants to know if the MD will change/increase her pain meds because they are not helping anymore.  Advised I would forward the message to MD.  Pt agreeable but as if we could put in an urgent call.  Advised that I would send her a message and she should respond promptly. [place]eger, Maryjo Rochester
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
## 2010-05-20 NOTE — Telephone Encounter (Signed)
Pt requesting an rx for pain, says it is urgent.

## 2010-05-25 LAB — T4, FREE: Free T4: 1.78 ng/dL (ref 0.80–1.80)

## 2010-05-25 LAB — URINALYSIS, ROUTINE W REFLEX MICROSCOPIC
Bilirubin Urine: NEGATIVE
Ketones, ur: NEGATIVE mg/dL
Nitrite: NEGATIVE
Protein, ur: NEGATIVE mg/dL
Urobilinogen, UA: 0.2 mg/dL (ref 0.0–1.0)

## 2010-05-25 LAB — TSH: TSH: 0.023 u[IU]/mL — ABNORMAL LOW (ref 0.350–4.500)

## 2010-05-25 LAB — PREGNANCY, URINE: Preg Test, Ur: NEGATIVE

## 2010-05-25 LAB — URINE MICROSCOPIC-ADD ON

## 2010-06-01 ENCOUNTER — Inpatient Hospital Stay (HOSPITAL_COMMUNITY): Admit: 2010-06-01 | Payer: Self-pay

## 2010-06-02 ENCOUNTER — Telehealth: Payer: Self-pay | Admitting: Family Medicine

## 2010-06-02 NOTE — Telephone Encounter (Signed)
Need to speak with provider regarding a legal issue that she can help with

## 2010-06-03 ENCOUNTER — Encounter: Payer: Self-pay | Admitting: Family Medicine

## 2010-06-03 NOTE — Progress Notes (Signed)
Denied rx efill for lorazepam as she was supposed to f/u after her 2/14 mental health visit. Needs appt w Dr Madolyn Frieze before refills on this [facility]

## 2010-06-05 NOTE — Telephone Encounter (Signed)
Will defer this to pt's PCP who will be back in three days.  If issue is an emergency, patient will need an appointment to discuss or will need to leave a more detailed message.

## 2010-06-08 ENCOUNTER — Other Ambulatory Visit: Payer: Self-pay | Admitting: Obstetrics & Gynecology

## 2010-06-08 ENCOUNTER — Encounter (INDEPENDENT_AMBULATORY_CARE_PROVIDER_SITE_OTHER): Payer: Medicaid Other | Admitting: Obstetrics & Gynecology

## 2010-06-08 DIAGNOSIS — N938 Other specified abnormal uterine and vaginal bleeding: Secondary | ICD-10-CM

## 2010-06-08 DIAGNOSIS — N949 Unspecified condition associated with female genital organs and menstrual cycle: Secondary | ICD-10-CM

## 2010-06-08 DIAGNOSIS — Z1231 Encounter for screening mammogram for malignant neoplasm of breast: Secondary | ICD-10-CM

## 2010-06-08 LAB — URINALYSIS, ROUTINE W REFLEX MICROSCOPIC
Nitrite: NEGATIVE
Specific Gravity, Urine: 1.027 (ref 1.005–1.030)
Urobilinogen, UA: 1 mg/dL (ref 0.0–1.0)

## 2010-06-08 LAB — DIFFERENTIAL
Basophils Absolute: 0 10*3/uL (ref 0.0–0.1)
Basophils Relative: 1 % (ref 0–1)
Monocytes Relative: 7 % (ref 3–12)
Neutro Abs: 2.6 10*3/uL (ref 1.7–7.7)
Neutrophils Relative %: 45 % (ref 43–77)

## 2010-06-08 LAB — BRAIN NATRIURETIC PEPTIDE: Pro B Natriuretic peptide (BNP): <30 pg/mL (ref 0.0–100.0)

## 2010-06-08 LAB — BASIC METABOLIC PANEL
CO2: 23 mEq/L (ref 19–32)
Calcium: 8.8 mg/dL (ref 8.4–10.5)
Creatinine, Ser: 0.88 mg/dL (ref 0.4–1.2)
GFR calc Af Amer: >60 mL/min (ref >60)

## 2010-06-08 LAB — CBC
MCHC: 34.7 g/dL (ref 30.0–36.0)
RBC: 4 MIL/uL (ref 3.87–5.11)
RDW: 13.2 % (ref 11.5–15.5)

## 2010-06-09 NOTE — Progress Notes (Signed)
NAME:  [name], [name]                ACCOUNT NO.:  [id number]  MEDICAL RECORD NO.:  [id number]           PATIENT TYPE:  A  LOCATION:  [facility]                   FACILITY:  [facility]  PHYSICIAN:  Allie Bossier, MD        DATE OF BIRTH:  03-07-1966  DATE OF SERVICE:  06/08/2010                                 CLINIC NOTE  [name] is a 45 year old ""happily divorced" black G5, P2, A3.  She has a 45 year old daughter and a 82 year old son and 3 grandchildren. She is a referral here from Dr. Madolyn Frieze.  She complains of a 1-year history of having 2 periods per month and relatively severe pain.  She says she has a dull ache in her pelvis every day, but when she has her period the pain is severe.  She does report some dyspareunia with her current partner (over the last 6 months), but she says this may be due to the fact that he is "well and out."  PAST SURGICAL HISTORY:  She had 3 elective ABs.  SOCIAL HISTORY:  She smokes a pack a day for the last 20 years.  She quit drinking alcohol in 2009.  FAMILY HISTORY:  Negative for breast, GYN, and colon malignancies.  No latex allergies.  No drug allergies  REVIEW OF SYSTEMS:  Her last Pap smear was February 2012 which she says is normal.  She has never had a mammogram.  MEDICAL PROBLEMS:  She has chronic back pain.  She has hyperthyroidism, depression, reflux, and bilateral lower extremity lymphedema.  MEDICATIONS: 1. Levothyroxine daily. 2. Risperdal daily. 3. Prilosec daily. 4. Percocet p.r.n. for pain.  PHYSICAL EXAMINATION:  GENERAL:  A well-nourished, well-hydrated, pleasant black female. VITAL SIGNS:  Height unknown, weight 218 pounds, blood pressure 115/78, and pulse 61. HEENT:  Normal. PELVIC:  External genitalia is normal.  Speculum exam shows the cervix with a history of obstetrical trauma and second-degree prolapse. Bimanual exam reveals a uterus that is upper limits of normal for size, slightly tender with exam.  Her adnexa  are not palpable and there are no palpable masses.  ASSESSMENT/PLAN:  Dysfunctional uterine bleeding/menorrhagia.  She says her TSH was recently normal and GC and Chlamydia were also checked and were normal.  I am checking a GYN ultrasound.  Once that is back, I would suggest we do an endometrial biopsy prior to scheduling any surgical workup/procedure.     Allie Bossier, MD    MCD/MEDQ  D:  06/08/2010  T:  06/09/2010  Job:  [id number]

## 2010-06-12 ENCOUNTER — Ambulatory Visit (HOSPITAL_COMMUNITY)
Admission: RE | Admit: 2010-06-12 | Discharge: 2010-06-12 | Disposition: A | Discharge status: Home / Self Care | Payer: Medicaid Other | Source: Ambulatory Visit | Attending: Obstetrics & Gynecology | Admitting: Obstetrics & Gynecology

## 2010-06-12 DIAGNOSIS — N938 Other specified abnormal uterine and vaginal bleeding: Secondary | ICD-10-CM

## 2010-06-12 DIAGNOSIS — N949 Unspecified condition associated with female genital organs and menstrual cycle: Secondary | ICD-10-CM | POA: Insufficient documentation

## 2010-06-12 DIAGNOSIS — D259 Leiomyoma of uterus, unspecified: Secondary | ICD-10-CM | POA: Insufficient documentation

## 2010-06-12 DIAGNOSIS — Z1231 Encounter for screening mammogram for malignant neoplasm of breast: Secondary | ICD-10-CM

## 2010-06-22 ENCOUNTER — Telehealth: Payer: Self-pay | Admitting: Family Medicine

## 2010-06-22 NOTE — Telephone Encounter (Signed)
Need rxs for Oxycodone and Antivan.  If Antivan can be called in or faxed, please send to [facility] on [address].  Also patient wanted to inform you that she has an appt next month with Mental Health.

## 2010-06-23 ENCOUNTER — Other Ambulatory Visit: Payer: Self-pay | Admitting: Family Medicine

## 2010-06-23 DIAGNOSIS — F411 Generalized anxiety disorder: Secondary | ICD-10-CM

## 2010-06-23 MED ORDER — LORAZEPAM 2 MG PO TABS
2.0000 mg | ORAL_TABLET | Freq: Every day | ORAL | Status: DC | PRN
Start: 2010-06-23 — End: 2010-06-24

## 2010-06-24 ENCOUNTER — Other Ambulatory Visit: Payer: Self-pay | Admitting: Family Medicine

## 2010-06-24 DIAGNOSIS — F411 Generalized anxiety disorder: Secondary | ICD-10-CM

## 2010-06-24 DIAGNOSIS — M5126 Other intervertebral disc displacement, lumbar region: Secondary | ICD-10-CM

## 2010-06-24 MED ORDER — LORAZEPAM 2 MG PO TABS: 2.0000 mg | ORAL_TABLET | Freq: Every day | ORAL | Status: DC | PRN

## 2010-06-24 NOTE — Telephone Encounter (Signed)
Patient last seen by me early February 2011.  Will re-fill for next 4 months. I will call them in right now. She will need follow-up sometime in August or sooner if she wants any more medications.  Please also thank her for updating me about MH appointment. Please ask her to ask them to send most recent office visits notes to our clinic after she sees them. Thank you Britta Mccreedy!

## 2010-06-24 NOTE — Assessment & Plan Note (Signed)
Will refill Ativan. Will give 10 tablets and refill x3. Will not give any more until August 2012.

## 2010-06-24 NOTE — Assessment & Plan Note (Signed)
Will refill oxycodone. Patient on stable dose of 7.5 mg daily. Will give 3 refills. Will not refill until August 2012.

## 2010-06-25 ENCOUNTER — Other Ambulatory Visit: Payer: Self-pay | Admitting: Family Medicine

## 2010-06-25 DIAGNOSIS — M5126 Other intervertebral disc displacement, lumbar region: Secondary | ICD-10-CM

## 2010-06-25 MED ORDER — OXYCODONE HCL 5 MG PO TABS: ORAL_TABLET | ORAL | Status: DC

## 2010-06-25 NOTE — Progress Notes (Signed)
Attempted calling patient but all numbers we have listed are out of service,  Notified pharmacy to let patient know that rx is here to pick up. Placed in file in front office.

## 2010-06-25 NOTE — Progress Notes (Signed)
Prescription printed because can not call in Schedule 2

## 2010-07-06 ENCOUNTER — Ambulatory Visit: Payer: Medicaid Other | Admitting: Obstetrics & Gynecology

## 2010-07-07 ENCOUNTER — Ambulatory Visit: Payer: Medicaid Other | Admitting: Family Medicine

## 2010-07-22 ENCOUNTER — Telehealth: Payer: Self-pay | Admitting: Family Medicine

## 2010-07-22 NOTE — Telephone Encounter (Signed)
Hi [name]. One of my other patient [name] was supposed to schedule an appointment for an IUD that day, which would book me for that day.  Would you please schedule [name] for the first week of June instead please and let patient know?   Also, I don't have any results for her recently since early April when she had a negative pregnancy test.   Thank you for your help.

## 2010-07-22 NOTE — Telephone Encounter (Signed)
[name] was sched with you for Thurs appt on 5/21, just started classes again this week and cannot miss t hem.  She also is sched with Dr. Marice Potter at [facility] same day, and wanted to see her first and then see you afterwards.  Want to know if this would be okay and if you already have results would you call her and let her know.  Will resched for the 29th of this month if appt  Will available.

## 2010-07-24 NOTE — Op Note (Signed)
   NAME:  [name], [name]                          ACCOUNT NO.:  [id number]   MEDICAL RECORD NO.:  [id number]                   PATIENT TYPE:  AMB   LOCATION:  [facility]                                  FACILITY:  [facility]   PHYSICIAN:  Sandria Bales. Ezzard Standing, MD                 DATE OF BIRTH:  [birth year]   DATE OF PROCEDURE:  12/25/2001  DATE OF DISCHARGE:                                 OPERATIVE REPORT   [facility]S #[id number]   PREOPERATIVE DIAGNOSIS:  Foreign body right upper arm (Norplant device).   POSTOPERATIVE DIAGNOSIS:  Foreign body right upper arm (Norplant device).   OPERATION:  Removal of foreign body right upper arm using ultrasound  guidance.   SURGEON:  Sandria Bales. Ezzard Standing, [name]   ANESTHESIA:  MAC anesthesia with 10 cc of 1% Xylocaine.   COMPLICATIONS:  None.   INDICATIONS FOR PROCEDURE:  The patient is a 45 year old black female who  has had several Norplant devices in her right upper arm for about 6-1/2  years.  Dr. Turner Daniels was able to remove all the Norplant devices except for  one.  She now comes for attempted removal of this device.   DESCRIPTION OF PROCEDURE:  The patient was placed in the supine position  with her right arm rotated outward.  I was able to ultrasound and identify  the location of this Norplant device.  I marked the skin, painted it with  Betadine solution, infiltrated with 1% Xylocaine approximately 10 cc.  I  made an approximately 5 to 6 mm incision directed over the device and  removed the device.  I then closed the skin with a 5-0 Vicryl subcuticular  suture.  Placed Steri-Strips on the incision and then sterilely dressed it.   She tolerated the procedure well.  She will be discharged home today.  She  will be given Darvocet.  To see me back in ten days or fourteen for follow  up wound check.                                               Sandria Bales. Ezzard Standing, MD    DHN/MEDQ  D:  12/25/2001  T:  12/25/2001  Job:  [id number]   cc:   Solon Palm, D.O.
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
## 2010-07-24 NOTE — H&P (Signed)
[place]  Patient:    [name], [name] Visit Number: [id number] MRN: [id number]          Service Type: MED Location: [phone] Attending Physician:  McDiarmid, Leighton Roach. Dictated by:   Nolon Nations, [name] Admit Date:  05/17/2001                           History and Physical  CHIEF COMPLAINT: Pelvic area edema and erythema.  HISTORY OF PRESENT ILLNESS: Ms. Polzin is a 45 year old female, with history of congenital lymphedema and multiple hospitalizations for cellulitis, who presents with an approximate two week history of increasing erythema and edema to her groin, thighs, and vulvar area.  She tried Flagyl at home, which she said improved it temporarily but once she stopped that she had recurrent symptoms.  She is complaining of some emesis x3 yesterday and today.  Has had no fever, no chills except for warmth in that area.  No dysuria.  No vaginal discharge.  Is not sexually active.  REVIEW OF SYSTEMS: No fever, no chills.  No weight change.  No chest pain, no palpitations.  No shortness of breath.  GI/SKIN: Per HPI.  PROBLEM LIST:  1. Hypothyroidism.  2. Tobacco abuse.  3. Depressive disorder.  4. Migraines.  5. Bronchospasm.  6. Dyspepsia.  7. PMS.  8. Hip painful arthralgia.  9. Congenital lymphedema. 10. 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 11. Multinodular goiter. 12. Premenstrual dysphoric disorder.  MEDICATIONS:  1. Keflex 250 mg q.i.d. for prophylaxis.  2. Synthroid 0.3 mcg p.o. q.d.  3. Albuterol p.r.n.  ALLERGIES: No known drug allergies.  SOCIAL HISTORY: Has teenage daughter, one son.  No alcohol, no drugs.  On disability.  Smokes a pack a day.  FAMILY HISTORY: Diabetes and hypertension, brain cancer in uncle.  PHYSICAL EXAMINATION:  VITAL SIGNS: Temperature 98.9 degrees, BP 110/70.  Weight 251 pounds.  GENERAL: Flat affect.  Awake and oriented.  No acute distress.  HEENT: Normocephalic, atraumatic.  EOMI.   PERRL.  Sclerae nonicteric, not injected.  Nares patent.  Tongue slightly green, recent candy.  Poor dentition.  NECK: Supple.  No lymphadenopathy, no thyromegaly.  CARDIAC: Regular rate and rhythm.  No murmurs, rubs, or gallops.  LUNGS: Clear to auscultation bilaterally.  No wheezes, rales, rhonchi.  ABDOMEN: Obese, soft.  Some slight lower quadrant tenderness, suprapubic, superficial.  No masses.  No hepatosplenomegaly.  PELVIC: Erythema noted and some edema to thighs bilaterally, vulva, groin. Tenderness to palpation, slight warmth, mildly erythematous.  Some baseline underlying hyperpigmentation.  EXTREMITIES: Chronic lower extremity edema to knees, with some scaly changes.  NEUROLOGIC: Cranial nerves II-XII intact.  LABORATORY DATA: CBC WBC 4.9, H&H 12.5 and 36.8; platelets 298,000, preliminary reading FUC.  UA small LE, positive nitrite, trace ketones; 10-20 epithelials; 1-5 wbc/hpf; 3+ bacteria.  Wet prep - no clues, yeast, Trich; 0-5 wbc/hpf, 3+ bacteria.  ASSESSMENT/PLAN: This is a 45 year old female with congenital lymphedema, who presents with groin, thigh, and vulvar edema and erythema.  1. Groin area, thigh, vulvar erythema and edema.  Questionable whether or not     this is cellulitis versus yeast intertrigo.  Will cover both with Ancef,     Diflucan, pain medications.  Will give Phenergan for pain and nausea.  WBC     was normal and she had no fever.  No clear evidence of systemic infection     other than slight nausea and emesis.  This appears out of proportion to     her physical examination and is likely complicated by her psychiatric     issues.  The patient was insistent upon admission.  2. History of hypothyroidism.  Will continue Synthroid.  3. History of tobacco abuse.  Will give patch.  4. Positive urinalysis with positive epithelials.  Suspect asymptomatic.     Will not treat.  5. History of bronchospasm.  Will give albuterol p.r.n.  6. Depression.   Likely needs medications restarted as outpatient.Dictated by: Nolon Nations, [name] Attending Physician:  McDiarmid, Tawanna Cooler D. DD:  05/17/01 TD:  05/18/01 Job: [id number]

## 2010-07-24 NOTE — Discharge Summary (Signed)
[name], [name]                ACCOUNT NO.:  [id number]   MEDICAL RECORD NO.:  [id number]          PATIENT TYPE:  INP   LOCATION:  6705                         FACILITY:  [facility]   PHYSICIAN:  Adrian Blackwater, MDDATE OF BIRTH:  November 11, 1965   DATE OF ADMISSION:  07/09/2004  DATE OF DISCHARGE:  07/10/2004                                 DISCHARGE SUMMARY   ATTENDING PHYSICIAN:  Leighton Roach McDiarmid, [name]   MEDICATIONS AT DISCHARGE:  1.  Prozac 20 mg to take 1 tab daily.  2.  Synthroid 150 mcg to take 1 tab daily.  3.  Prilosec 20 mg p.o. daily.  4.  Prednisone 20 mg to take 2 tablets daily during 2 weeks.  5.  Pepcid 20 mg a day 1 tablet p.o. b.i.d.  6.  __________ 25 mg to take 1 tablet q.i.d. as needed for itching.  7.  Lidocaine/prilocaine EMLA cream to apply on skin p.r.n. itching.   DIAGNOSES AT DISCHARGE:  Hypersensitivity reaction secondary to PENICILLIN  or DIFLUCAN.   OTHER DIAGNOSES:  1.  Hypothyroidism.  2.  Anxiety and depression.  3.  Dyspepsia.  4.  Tobacco abuse.  5.  Congenital lymphedema.   BRIEF HISTORY:  This is a 45 year old, African-American female with a  history of congenital lymphedema, who was admitted with bilateral hand  swelling, probably urticarial or hypersensitivity state.  The patient being  recently treated for early __________ treated with penicillin that may be  causing this reaction.  1.  Hypersensitivity reaction:  The patient was admitted with increasing      swelling mostly on the right hand, erythema, and during the 24-hour      course of hospitalization developed hives or urticarial change in the      skin.  Patient received Solu-Medrol treatment 125 mg in the ED and      continued at 50 mg IV and was changed at discharge to 40 mg of      prednisone p.o. daily to continue treatment during two weeks.  Also,      received treatment with Pepcid 20 mg p.o. b.i.d. as H1 and H2 receptor      blockers to assist with corticosteroid as  treatment for the      hypersensitivity.  Also, a topical anesthetic prescribed for the itching      and Vistaril to help with the itching and anxiety.  2.  Congenital lymphedema:  Patient did not have any exacerbation of her      usual legs edema at this hospitalization.  3.  Depression and anxiety:  Patient was already on treatment with Prozac 20      mg p.o. daily for premenstrual syndrome prescribed by Dr. Okey Dupre of      gynecologic clinic.  Did not require any further treatment at hospital.  4.  Hypothyroidism:  Continued Synthroid 150 mcg p.o. daily.  5.  Dyspepsia:  Patient was on prednisone 40 mg p.o. daily.  Since Pepcid      added to treatment with different purpose, this may be reevaluated as an  outpatient with Dr. Dahlia Byes.  6.  Patient also had generalized skin lesions on hands and trunk, probably      compatible with a tinea corpora that should be evaluated as an      outpatient.  7.  Neutropenia:  Patient at admission had white blood cells of 4.1.  at      discharge, 3.8.  Patient used to have this course in previous      hospitalizations, and differential was predominantly eosinophils 9%,      neutrophils 52%, lymphocytes 81%, myocytes 8%, basophils 8%.  8.  Tobacco abuse:  Patient admitted to smoke 1-1/2 packs per day during the      last 13 years.  Counseling was given, but patient states she is not      ready to quit yet.   LABORATORY DATA:  Urinalysis was done and was negative.  CBC showed a  decreased white blood cell count with eosinophil predominance, white blood  cells 3.8 at discharge, hemoglobin 12.1, hematocrit 34.2, platelets 254, CRP  is 0.4, ESR 11, RPR titer 8 and reactive TPPA.  Also, blood culture showed  no growth today preliminarily.   CONSULTATIONS:  None.   PROCEDURES:  None.      IM/MEDQ  D:  07/10/2004  T:  07/11/2004  Job:  [id number]   cc:   Georgina Peer, [name]  [address] [place], [place] [address]  Fax: [phone]

## 2010-07-24 NOTE — Discharge Summary (Signed)
NAMESOMARA, FRYMIRE                ACCOUNT NO.:  [id number]   MEDICAL RECORD NO.:  [id number]          PATIENT TYPE:  INP   LOCATION:  3007                         FACILITY:  [facility]   PHYSICIAN:  Johney Maine, [name]   DATE OF BIRTH:  1965-08-19   DATE OF ADMISSION:  07/13/2006  DATE OF DISCHARGE:  07/18/2006                               DISCHARGE SUMMARY   DATE OF ADMISSION:  Jul 13, 2006.   DATE OF DISCHARGE:  Jul 18, 2006.   ADMITTING DIAGNOSES:  1. Cellulitis of the bilateral thighs.  2. Abscess of the right upper thigh.  3. Hypothyroid.  4. Depression and anxiety.  5. Chronic lymphedema, congential.   DISCHARGE DIAGNOSES:  1. Cellulitis of bilateral thighs, resolving.  2. Abscess status post incision and drainage.  3. Hypothyroid.  4. Depression and anxiety.  5. Chronic lymphedema, congential.  6. Left ankle pain.  7. Macrocytic anemia.   DISCHARGE MEDICATIONS:  1. Synthroid 200 mcg one tablet p.o. daily.  2. Gentamicin 300 mg one tablet p.o. q.6 hours x8 days.  3. Levaquin 750 mg one tablet p.o. daily x8 days.  4. Prilosec 20 mg one tablet daily before meals.  5. Ibuprofen 600 mg one tablet p.o. q.8 hours p.r.n. as needed      cellulitis and ankle pain.  6. Prozac 20 mg p.o. daily.   HOSPITAL COURSE:  This is a 41history of female with chronic congential  lymphedema who has a history of multiple admissions for cellulitis who  was admitted for cellulitis and what appeared to be a right thigh  abscess.  The patient has had a boil on her thigh for a while, however  it worsened a couple of days ago and was seen by Urgent Care and failed  outpatient treatment on Bactrim and came in because of increasing  redness and worsening abscess and history of fevers.  Over her hospital  course she continued to improve on IV antibiotics and also had an I&D  performed by surgery, which also was improved her symptoms and by day of  discharge it was markedly improved, but will need  close followup.  Please see following for details:  1. Cellulitis:  We initially started the patient on vancomycin IV      since she failed outpatient p.o. therapy.  We obtained blood      cultures and the patient appeared well, did appear septic, so we      felt these will be negative, however we wanted to make sure.  We      did not I&D at bedside, as we felt the abscess was not ready for      incision and drainage.  Instead we continued to watch it.  We      treated her pain with morphine.  We continued to follow her fevers      and white blood cell count.  Over her hospital course, she remained      afebrile.  Her white blood cell count continued to be normal      throughout her hospital stay.  Although the  erythema decreased with      the IV vancomycin we felt that surgery needed to be consulted for      the size of the abscess and its location.  Surgery did come by and      consulted and performed an I&D on a 3-cm abscess under local      anesthesia with Xylocaine.  There were no complications of this and      culture was obtained, which came back positive for Proteus      mirabilis.  Sensitivities are still pending.  This did not appear      to be a MRSA abscess.  Blood cultures remained negative throughout      her hospital stay.  Once the cultures came back as Proteus we      discontinued the vancomycin and switched the patient over to p.o.      clindamycin and then added Levaquin on day of discharge.  The      patient will require a total of 14 days of antibiotics, which would      be an additional eight days since she has received six days in the      hospital.  In summary, the patient had a six day course of IV      antibiotics essentially with vancomycin and then switched over to      clindamycin and Levaquin.  Clinically she improved with decreased      erythema by day of discharge.  She is status post I&D.  The patient      will continue to change her dressing three times a  day and apply      warm compresses and/or sitz baths as needed.  She also has close      follow up with the [facility] this week to make sure      the site continues to look well and the patient remains afebrile.      Please note that there appears to be the beginning of another boil      or abscess on her other left extremity, however is not ready to be      I&D and hopefully the antibiotic therapy will be able to treat it,      but this why further followup is required.  2. Hypothyroid:  It was noted during this hospital stay that the      patient's TSH was markedly elevated at 44 on her home dose of      Synthroid, as well as her T3 and T4 was low, therefore we increased      her Synthroid to 200 mcg daily.  Her primary care physician Dr.      Erenest Rasher should recheck thyroid labs in about six to eight weeks      and may need to adjust the Synthroid as needed.  Otherwise, the      patient remains stable.  3. Depression, anxiety:  We continued the patient's home dose of      Prozac 20 mg p.o. daily.  No changes were made to this.  4. Chronic congenital lymphedema:  This congenital disease is related      to the numerous cellulitis and abscess that this patient has.  We      will treat her cellulitis and abscesses as needed and continue to      follow her.  The patient does continue to have lower extremity      lymphedema, which is  chronic changes and occasionally causes pain.      No changes are made.  5. Left ankle pain:  Please note that during the patient's hospital      stay she complained of left ankle pain and swelling and warmth.  We      did get a left ankle radiograph, which only showed some soft tissue      swelling.  There were no fractures.  We also obtained a uric acid,      which was within normal limits.  We felt that this pain was most      likely secondary to the lymphedema and no further action was taken     at this time except to include ibuprofen as a  pain regimen for this      patient.  Her primary care Candia Kingsbury can follow up with the ankle      pain as needed.  Please note that the patient states that she has      gastritis, although it appears that she only has GERD and was      instructed that if ibuprofen bothers her stomach to discontinue      this, however she tolerated it while in the hospital and at home.  6. Macrocytic anemia:  This patient was noted to have an MCV of 104.9      during this hospital stay, as well as a hemoglobin that hovered in      the 10.8 range, a vitamin B12 was within normal limits, a folate      was elevated at 736, therefore it is unknown etiology but her      primary care physician could followup as needed.  Her reticulocyte      count was 1.7 with absolute reticulocytes of 53.2, which is within      normal limits.  We feel that this could be worked up as an      outpatient and no further studies are needed.   PERTINENT LABS:  Most of these I have dictated, however please note that  initial CBC showed a white count of 5.8, which is normal, hemoglobin  initially was 13.7 with an MCV of 103, platelets 250, neutrophils 73%.  By day of discharge her hemoglobin was 11.8, white blood cells 4.7, MCV  was 102.  Please note we feel that this dip in hemoglobin was secondary  to the IV fluids given throughout her hospital stay.  Initial  comprehensive metabolic panel was within normal limits except for a  potassium that was low at 3.4.  Creatinine was normal at 0.89.  By day  prior to discharge her comprehensive metabolic panel was within normal  limits.  Potassium that was normal at 3.6 and a creatinine that was  normal at 0.81.  TSH was remarkably elevated at 44.24.  Free T4 low at  0.87.  Free T3 low at 1.9.  Reticulocyte count 1.7% and absolute  reticulocyte count 53.2.  Vitamin B12 633.  Folic acid 736.  Uric acid  4.7.  Blood cultures remained no growth.  Her abscess culture shows a  Proteus, however  sensitivities are pending.   IMAGES:  Left ankle radiograph showed no acute bony abnormality and only  soft tissue swelling.  Lower extremity ultrasound showed a focal abscess  measuring about 2 cm, greatest diameter in the inner upper right thigh,  as well as a smaller abscess 9 mm, but it could also be a lymph node in  the  left upper thigh.   CONSULTS:  Surgery for bedside incision and drainage.   This patient was discharged in improved and stable condition with close  followup on Jul 21, 2006 at 2 p.m. Dr. Orlene Erm at [facility], as her primary care physician did not have an appointment.           ______________________________  Johney Maine, [name]     JT/MEDQ  D:  07/18/2006  T:  07/18/2006  Job:  [id number]

## 2010-07-24 NOTE — H&P (Signed)
[facility]  Patient:    [name], [name]                       MRN: [id number] Adm. Date:  [id number] Disc. Date: [id number] Attending:  McDiarmid, Leighton Roach CC:         Guadalupe Dawn, [name] - [facility]   History and Physical  CHIEF COMPLAINT:  The patient is a 45 year old para 2 who desires sterilization procedure.  HISTORY OF PRESENT ILLNESS:  At present the patient is using Norplant.  The risks, benefits, and alternative forms of management were reviewed with the patient including but not limited to risk of failure of 4 to 8 per 1,000 cases with an increased risk of a subsequent ectopic gestation.  ALLERGIES:  No known drug allergies.  MEDICATIONS:  Keflex, Prozac, Prevacid, Synthroid.  PAST OB/GYN HISTORY:  A history of syphilis, HIV nonreactive by report.  She is status post 2 normal spontaneous vaginal deliveries.  Pap smear in December of 2001, within normal limits by patients report.  PAST MEDICAL HISTORY:  Chronic lower extremity lymphedema.  Depression.  GERD, and hypothyroidism status post total thyroidectomy.  PAST SURGICAL HISTORY:  As above.  Leg surgery, face surgery, knee surgery.  PHYSICAL EXAMINATION:  VITAL SIGNS:  Pulse 68, blood pressure 115/80.  Weight 241 pounds.  Height 5 foot 8.  GENERAL:  Well-developed, well-nourished no apparent distress.  HEENT:  Normocephalic and atraumatic.  NECK:  There is a scar.  LUNGS:  Clear to auscultation bilaterally.  HEART:  Regular rate and rhythm.  ABDOMEN:  Obese, soft, nontender.  No organomegaly.  PELVIC:  External female genitalia:  On the labium minora there are multiple flat hyperpigmented areas likely post trauma as per the patients history. They have regular borders that is discreet.  On speculum exam the vagina is clean.  On bimanual exam this is limited by the patients body habitus, however, the uterus is small, anteverted, the adnexa are  nonpalpable, nontender.  SKIN:  Without rash.  ASSESSMENT:  A 45 year old multipara who desires sterilization procedure.  PLAN:  Laparotomy bilateral butal ligation.  Will obtain the Pap smear report from December 2001. DD:  07/22/00 TD:  07/22/00 Job: [id number]

## 2010-07-24 NOTE — H&P (Signed)
[name], [name]                ACCOUNT NO.:  [id number]   MEDICAL RECORD NO.:  [id number]          PATIENT TYPE:  INP   LOCATION:  3007                         FACILITY:  [facility]   PHYSICIAN:  [name], [name]  DATE OF BIRTH:  May 21, 1965   DATE OF ADMISSION:  07/13/2006  DATE OF DISCHARGE:                              HISTORY & PHYSICAL   CHIEF COMPLAINT:  Right upper thigh boil and surrounding redness.   HISTORY OF PRESENT ILLNESS:  This is a 45 year old African American  female with chronic congenital lymphedema and a history of multiple  admissions for cellulitis who presented to the ED this morning for  evaluation of right thigh abscess and surrounding redness.  The patient  states that she developed a painful boil on her right upper thigh about  4 days ago.  This gradually worsened and she was seen at the Urgent Care  Center about 1.5 days ago.  She was started on Bactrim DS 1 tablet p.o.  b.i.d.  She took two doses and then came in today because the erythema  and abscess were progressing.  She complains of subjective fevers,  malaise and nausea.  She vomited times one yesterday.  No sick contacts.  She also complains of itching after receiving Dilaudid in the ED.  Vancomycin was also started in the ED.   CURRENT MEDICATIONS:  1. Synthroid 150 mg p.o. daily.  2. Prozac 20 mg p.o. daily.  3. Prevacid 20 mg p.o. daily.  4. Bactrim DS 1 tablet p.o. b.i.d.   ALLERGIES:  NO KNOWN DRUG ALLERGIES.   PAST MEDICAL HISTORY:  1. Chronic lymphedema congenital.  2. Hypothyroidism status post thyroidectomy for multinodular goiter.  3. Multiple episodes of lower extremity cellulitis.   IMPRESSION:  Depression and anxiety disorder.   FAMILY HISTORY:  Father died of an MI at age 19.  Also had diabetes and  hypertension.  The patient has a son with depression, ADHD and  migraines.  The patient's mom is living and has type 2 diabetes.   SOCIAL HISTORY:  The patient lives with  her son.  She also has one  teenage daughter.  She smokes one half per day.  She is on disability.  Social alcohol drinking.  No drugs.   PHYSICAL EXAMINATION:  GENERAL:  This is a well-nourished, well-  developed female in no acute distress.  She is alert and cooperative  throughout the exam.  HEENT:  Head normocephalic, atraumatic.  Vision grossly intact.  Pupils  equal, round, reactive to light and accommodation.  No injection.  Mouth:  Good dentition.  Pharynx pink and moist.  No erythema, no  exudate.  NECK:  She has a horizontal scar at the midline of her neck.  Supple.  Full range of motion.  No masses.  No thyroid nodules or tenderness.  No  JVD.  No carotid bruits.  No cervical lymphadenopathy.  Lungs: Normal  respiratory effort.  CHEST:  Expands symmetrically.  LUNGS:  Clear to auscultation.  No crackles or wheezes.  HEART:  Regular rate and rhythm.  S1-S2 normal without gallops, murmurs,  clicks or extra sound.  ABDOMEN:  Soft, nontender, nondistended.  Positive bowel sounds.  No  hernias noted.  EXTREMITIES:  The patient has bilateral lower extremity swelling but no  pitting edema.  SKIN:  The patient has bilateral erythema in the medial aspects of both  thighs, extending down to her knees and up to the vaginal area, right  side greater than left.  She also has a 1.5 x 1.5 indurated area on her  right upper medial thigh area that is not fluctuant.   IMPRESSION:  This is a 45 year old female with cellulitis and abscess.  1. Cellulitis and abscess.  We will cover her empirically with      vancomycin since the infection has spread rather quickly with      Bactrim.  We will follow the blood cultures.  Currently she is not      septic appearing.  We will reassess the abscess later today for      possible I&D.  For pain we will give her morphine.  For itching,      Benadryl.  We will monitor her fever and white blood cell trend.      We will give her IV fluids, D5 half normal  saline 20 KCl at 100 mL      an hour since she is slightly hypokalemic.  We will have the nurses      elevate her leg and apply heat.  2. Hypothyroidism.  We will check a TSH.  Continue Synthroid.      [name], [name]     KS/MEDQ  D:  07/13/2006  T:  07/13/2006  Job:  [id number]

## 2010-07-24 NOTE — Discharge Summary (Signed)
[place]. [facility]  Patient:    [name], [name] Visit Number: [id number] MRN: [id number]          Service Type: MED Location: [phone] Attending Physician:  McDiarmid, Leighton Roach. Dictated by:   Vear Clock, [name] Admit Date:  05/17/2001 Discharge Date: 05/19/2001   CC:         Solon Palm, [name], [facility]   Discharge Summary  PRIMARY PHYSICIAN:  Solon Palm, [name], [facility].  DISCHARGE DIAGNOSES: 1. Candidal intertrigo. 2. Anxiety, severe. 3. Nausea and vomiting, likely secondary to #2 or viral gastroenteritis. 4. Hypothyroidism. 5. Dyspepsia.  DISCHARGE MEDICATIONS: 1. Nystatin cream apply to affected areas t.i.d. 2. Diflucan 150 mg p.o. q.d. x total of 7 days. 3. Protonix 40 mg p.o. q.d. 4. Eucerin cream apply topically p.r.n. up to q.i.d. 5. Phenergan suppositories one PR q.4-6h. p.r.n. nausea/vomiting, (#10),    no refills. 6. Vistaril 50 mg one to two tabs p.o. q.6h. p.r.n. anxiety/itching, (#30),    no refills. 7. Continue home medications:    a. Albuterol MDI two puffs q.4h. p.r.n.    b. Keflex 250 mg q.i.d.    c. Synthroid 0.3 mcg q.d.  FOLLOWUP:  The patient is to follow up with Dr. Cathlean Cower on Friday, May 26, 2001 at 8:30 a.m.  PROCEDURES AND DIAGNOSTIC STUDIES:  None.  CONSULTANTS:  None.  ADMISSION HISTORY AND PHYSICAL:  Please see dictated H&P for further details. In short, this is a 45 year old African-American female with a history of congenital lymphedema with multiple hospitalizations who presents with 2-week history of increasing erythema and edema to her groin, thigh, and vulvar area, no fever, no chills, positive emesis.  The patient strongly requested admission for possible early cellulitis.  HOSPITAL COURSE: #1 - DERMATOLOGY:  The patient without elevated WBC or fever throughout hospitalization and improved with treatment very likely to have  been candidal intertrigo, treat with Nystatin cream t.i.d. x a total of 10 days and Diflucan q.d. x7 days, also Eucerin cream for her hands q.i.d. p.r.n.  #2 - ANXIETY:  The patient was extremely anxious throughout hospitalization, was very demanding to nursing and medical staff.  The patient was given Vistaril for anxiety although this was not a previous home medication, likely should be followed up as an outpatient.  #3 - NAUSEA AND VOMITING:  Likely secondary to the anxiety.  The patient was very definite about what she was and was not willing to do about her nausea and vomiting.  She refused clear liquids, wanted IV fluids despite a normal UA and was overall relatively well-appearing so anxiety was likely to be a large a component in the nausea and vomiting although the patient did have a component of viral gastroenteritis as well and she was given Phenergan suppositories #10 for discharge.  #4 - DYSPEPSIA:  The patient has a history of dyspepsia, was complaining of some burning abdominal pain, was given a GI cocktail which presumably improved this pain a little, started on Protonix 40 mg p.o. q.d. and this was continued for discharge.  No other issues resulted during this hospitalization.  DISCHARGE LABORATORIES:  CBC on May 18, 2001 shows WBC 3.7, hemoglobin 11.5, platelets 265.  Chemistries on May 18, 2001 - sodium 140, potassium 4.2, chloride 110, CO2 27, BUN 11, creatinine 0.8, glucose 114, AST 58, ALT 44, alkaline phosphatase 35 and bilirubin 1.6.  Calcium 8.8.  Urine ketones and specific gravity on May 19, 2001 showed no ketones, specific gravity less than 1.030.  DISPOSITION:  The patient was discharged to home without further incident. Dictated by:   Vear Clock, [name] Attending Physician:  McDiarmid, Tawanna Cooler D. DD:  05/19/01 TD:  05/21/01 Job: [id number]

## 2010-07-24 NOTE — Discharge Summary (Signed)
[place]. [facility]  Patient:    [name], [name]                       MRN: [id number] Adm. Date:  [id number] Disc. Date: [id number] Attending:  McDiarmid, Leighton Roach. Dictator:   Ilene Qua, [name] CC:         Guadalupe Dawn, [name]   Discharge Summary  DATE OF BIRTH:  [birth year]  CONSULTATIONS:  None.  PROCEDURES:  Lower extremity Dopplers, which revealed no evidence of DVT, superficial thrombus, or Bakers cyst bilaterally.  DISCHARGE DIAGNOSES: 1. Cellulitis of the left leg. 2. Lymphedema, hereditary. 3. Hypothyroidism. 4. Tobacco abuse. 5. Depression. 6. History of migraines.  DISCHARGE MEDICATIONS: 1. Keflex 500 mg 2 tablets p.o. q.i.d. x 11 days, then Keflex 500 mg p.o.    b.i.d. thereafter. 2. Prevacid 30 mg p.o. q.d. 3. Prozac 40 mg p.o. q.d. 4. Synthroid 150 mcg p.o. q.d. 5. Atarax 25 mg p.o. q.6h. p.r.n. itching. 6. Kenalog 0.1% ointment applied to skin as directed b.i.d.  HISTORY OF PRESENT ILLNESS:  Please see the admission history and physical for complete details.  In brief, this is a 45 year old African-American female who presents with known history of hereditary lymphedema.  She presents with increasing lower extremity pain and increased erythema and warmth over the past week.  She did report fevers up to 102, associated with chills.  She was seen last week by Dr. Roseanne Reno in clinic and was found to have a diffuse rash. She also had treatment for presumed sexually transmitted disease, I am not sure which one.  She has been treated with Keflex on chronic suppression for recurrent cellulitis but stopped the Keflex about a week ago secondary to her rash.  She did receive 1 g of IV Ancef in the ER prior to blood cultures being obtained.  ADMISSION LABORATORY DATA:  A UA showed small hemoglobin, trace leukocyte esterase.  Microscopic showed 6-10 white cells, 0-5 red blood cells, few bacteria, and calcium oxalate crystals.   Blood cultures are pending. Hemoglobin was 12.4, white count 10.7, platelet count 317, 89 segs, 6% lymphs. ANC was 9.5.  HOSPITAL COURSE: #1 - LEFT LOWER EXTREMITY CELLULITIS:  The patient was placed on Levaquin 500 mg IV q.d. originally after looking back at her last hospitalization where infectious disease was consulted and recommended a two-week course of Levaquin, given her recurrent infections and hereditary lymphedema.  This was changed the following day to p.o., and she received an additional day of p.o. Tequin prior to being discharged, at which time she was changed over to Keflex 500 mg four times a day to complete a 14-day course of antibiotics.  She did have much improvement in her erythema and tenderness of her left lower extremity during her hospitalization.  Given her severe pain with palpation of her left lower extremity calf, she did have lower extremity Dopplers to rule out a DVT, and these were negative for DVT or superficial thrombus.  #2 - DEPRESSION:  The patient complained that she was very depressed on admission.  Denied any suicidal ideation but did say that she was under many life stressors now, with a teen-aged daughter.  She was continued on Prozac and, evidently, had a chronic issue with insomnia.  Has been tried on multiple medication regimens, none of which worked.  She was placed on Ambien p.r.n. for insomnia here in the hospital but reported that that was of minimal help.  #  3 - HYPOTHYROIDISM:  She was continued on her home dose of Synthroid.  A TSH level was checked, which is pending at the time of this dictation.  #4 - SKIN RASH:  This was felt to be more of an atopic dermatitis, and she had some lichenification from itching of her rash.  She was advised to use Dove soap and avoid hot showers and letting her skin get too dry.  She was given a prescription for Kenalog ointment to be applied to her skin twice a day to see if this would relieve some of  her itching, as well as advised to use Cetaphil lotion or Eucerin cream for moisturizing the skin.  ACTIVITY:  Increase as tolerated.  WOUND CARE:  She was advised steroid cream to the rash areas, avoiding the face, as directed twice daily.  She is to keep the skin moisturized, avoiding harsh deodorant soaps, preferably using Dove soap, avoiding long showers, and to apply the Cetaphil or Eucerin cream on the skin b.i.d.  FOLLOW-UP:  She needs to schedule a follow-up appointment with Dr. Roseanne Reno at the family practice center in the next two weeks.  She was instructed to call [phone] for an appointment. DD:  05/24/00 TD:  05/25/00 Job: [id number]

## 2010-07-24 NOTE — Op Note (Signed)
   NAME:  [name], [name]                          ACCOUNT NO.:  [id number]   MEDICAL RECORD NO.:  [id number]                   PATIENT TYPE:  AMB   LOCATION:  SDC                                  FACILITY:  [facility]   PHYSICIAN:  Phil D. Okey Dupre, [name]                  DATE OF BIRTH:  02-Dec-1965   DATE OF PROCEDURE:  01/22/2002  DATE OF DISCHARGE:                                 OPERATIVE REPORT   PREOPERATIVE DIAGNOSIS:  Voluntary sterilization.   POSTOPERATIVE DIAGNOSIS:  Voluntary sterilization.   PROCEDURE:  Laparoscopic sterilization.   SURGEON:  Javier Glazier. Rose, [name]   DESCRIPTION OF PROCEDURE:  Under satisfactory general anesthesia with the  patient in the dorsal supine position, the perineum, vagina and abdomen were  prepped and draped in the usual sterile manner.  Bimanual pelvic  exacerbation under anesthesia failed to enable [place] to outline the pelvic  organs because of the obesity of the patient.  An open Simms speculum was  placed in the vagina to be able to visualize the cervix and a clipping sound  was placed into the cervix and attached to the cervix for mobilization of  the uterus.  The Veress needle was inserted into the peritoneal cavity at  the lower pole of the umbilicus.  Under controlled conditions, approximately  3 liters of carbon dioxide were slowly insufflated into the peritoneal  cavity.  Equal tympany was heard over the entire abdominal wall.  Trocar was  removed.  The Veress needle was removed.  The laparoscopic trocar was  inserted into the peritoneal cavity through a 1 cm transverse incision just  below the umbilicus.  Trocar was removed from the sleeve.  The laparoscope  was inserted.  Pelvic organs were easily visualized and found to be normal.  Each fallopian tube was grasped in the midpoint and coagulated with the  bipolar current until blanching had occurred and then the tubes were  sectioned with the laparoscopic scissors.  The area was observed for  bleeding.  None was noted.  The laparoscope was removed.  As much as CO2 as  possible was expressed through the sleeve, the sleeve removed, and the  incision closed with 3-0 Vicryl suture on an atraumatic needle which was  continued up to a subcuticular closure.  Dry sterile dressing was applied.  Tenaculum and speculum were removed from the vagina.  The patient was  transferred to the recovery room in satisfactory condition.                                               Phil D. Okey Dupre, [name]    PDR/MEDQ  D:  01/22/2002  T:  01/22/2002  Job:  [id number]

## 2010-07-24 NOTE — Op Note (Signed)
[name], [name]                ACCOUNT NO.:  [id number]   MEDICAL RECORD NO.:  [id number]          PATIENT TYPE:  INP   LOCATION:  3007                         FACILITY:  [facility]   PHYSICIAN:  Sandria Bales. Ezzard Standing, [name]  DATE OF BIRTH:  Jul 23, 1965   DATE OF PROCEDURE:  07/15/2006  DATE OF DISCHARGE:                               OPERATIVE REPORT   PREOPERATIVE DIAGNOSIS:  A 3-cm abscess upper inner aspect of right  thigh.   POSTOPERATIVE DIAGNOSIS:  A 3-cm abscess upper inner aspect of right  thigh.   PROCEDURE:  Incision and drainage of abscess.   SURGEON:  Sandria Bales. Ezzard Standing, [name]   ANESTHESIA:  Xylocaine 1%, 3 mL.   INDICATIONS FOR PROCEDURE:  Ms. [name] is a 45 year old black  female who is a patient of the [facility] cared  for by Dr. [name], was admitted to the hospital on May 7 with  an infection in the upper aspect of her right thigh.  She has chronic  bilateral lower extremity edema.  She now comes for incision and  drainage of abscess with the patient at her bedside, I discussed that I  think it would be best for this to be incised and drained and cultured.   OPERATIVE NOTE:   With the patient in the supine position, the inner aspect of her thigh  is painted with Betadine solution, infiltrated with about 3-4 mL of 1%  Xylocaine.  A linear incision was made into the abscess.  I got about  maybe 3 or 4 mL of pus.  I obtained a culture of this and sent it for  culture and sensitivity.   I then packed it with saline gauze and sterilely dressed it.  She can  now change this 3-4 times a day.  When she goes home, she should soak  this and just use soap and water to keep it clean, and this should heal  on its own.  There is no reason to see [place] back in our office unless she  has a continued problem with this abscess.   She also showed me an area on her pubic which is about 0.5 cm in size,  but I think this really will take care of itself  and does not need  surgical drainage at this time.      Sandria Bales. Ezzard Standing, [name]  Electronically Signed    DHN/MEDQ  D:  07/15/2006  T:  07/16/2006  Job:  [id number]   cc:   Etta Grandchild, [name]  [name], [name]

## 2010-07-27 ENCOUNTER — Other Ambulatory Visit: Payer: Self-pay | Admitting: Obstetrics & Gynecology

## 2010-07-27 ENCOUNTER — Other Ambulatory Visit (HOSPITAL_COMMUNITY)
Admission: RE | Admit: 2010-07-27 | Discharge: 2010-07-27 | Disposition: A | Discharge status: Home / Self Care | Payer: Medicaid Other | Source: Ambulatory Visit | Attending: Obstetrics & Gynecology | Admitting: Obstetrics & Gynecology

## 2010-07-27 ENCOUNTER — Ambulatory Visit: Payer: Medicaid Other | Admitting: Obstetrics & Gynecology

## 2010-07-27 ENCOUNTER — Ambulatory Visit: Payer: Medicaid Other | Admitting: Family Medicine

## 2010-07-27 DIAGNOSIS — N938 Other specified abnormal uterine and vaginal bleeding: Secondary | ICD-10-CM | POA: Insufficient documentation

## 2010-07-27 DIAGNOSIS — N949 Unspecified condition associated with female genital organs and menstrual cycle: Secondary | ICD-10-CM | POA: Insufficient documentation

## 2010-07-27 DIAGNOSIS — N920 Excessive and frequent menstruation with regular cycle: Secondary | ICD-10-CM

## 2010-07-28 NOTE — Group Therapy Note (Signed)
NAME:  [name], [name]                ACCOUNT NO.:  [id number]  MEDICAL RECORD NO.:  [id number]           PATIENT TYPE:  A  LOCATION:  [facility]                   FACILITY:  [facility]  PHYSICIAN:  Allie Bossier, MD        DATE OF BIRTH:  05-20-1965  DATE OF SERVICE:  07/27/2010                                 CLINIC NOTE  Ms. Polack is a 45 year old divorced African American G3, P2, A3 who has a followup from her exam last month.  At that time, she was telling me about a 1-year history of having 2 periods per month and relatively severe pain.  Today, she describes her pelvic pain is excruciating but says that her main concern is that of her "hormones."  She has a longstanding history of depression and she feels that it is worse when she has her periods.  [facility].  I am inclined to let them handle her longstanding depression instead of trying to evaluate her for PMDD as well.  Please note that since her last visit, she has had a normal mammogram and I had ordered an ultrasound that showed a focal myometrial fundal fibroid in the left fundus measuring 2.7 cm that is unchanged in size.  Her endometrial stripe is 3.9 mL and her ovaries appeared normal.  Based on her history of irregular bleeding, I feel that an endometrial biopsy is warranted.  Therefore, I checked a pregnancy test that was negative.  I prepped her cervix with iodine, placed a single-tooth tenaculum on the anterior lip of her cervix and used Pipelle biopsy for 3 passes and obtained a moderate amount of tissue.  She tolerated the procedure well. I gave her 800 of Motrin post procedure.  I have given her handout on endometrial ablation but explained that that is not necessarily going to take care of her "excruciating pain."  I told her that to take care of her pain, we would need to do a hysterectomy and removal of both ovaries and tubes which of course would yield her  menopausal.  She is not sure what if any procedure she would like at this point and so I am giving her written information of options and she will come back when she has a better idea of how she would like to proceed with her treatment.     Allie Bossier, MD    MCD/MEDQ  D:  07/27/2010  T:  07/28/2010  Job:  [id number]

## 2010-07-29 LAB — POCT PREGNANCY, URINE: Preg Test, Ur: NEGATIVE

## 2010-08-11 ENCOUNTER — Ambulatory Visit (INDEPENDENT_AMBULATORY_CARE_PROVIDER_SITE_OTHER): Payer: Medicaid Other | Admitting: Family Medicine

## 2010-08-11 ENCOUNTER — Encounter: Payer: Self-pay | Admitting: Family Medicine

## 2010-08-11 DIAGNOSIS — I890 Lymphedema, not elsewhere classified: Secondary | ICD-10-CM

## 2010-08-11 DIAGNOSIS — F329 Major depressive disorder, single episode, unspecified: Secondary | ICD-10-CM

## 2010-08-11 DIAGNOSIS — R21 Rash and other nonspecific skin eruption: Secondary | ICD-10-CM

## 2010-08-11 DIAGNOSIS — E039 Hypothyroidism, unspecified: Secondary | ICD-10-CM

## 2010-08-11 DIAGNOSIS — M5126 Other intervertebral disc displacement, lumbar region: Secondary | ICD-10-CM

## 2010-08-11 DIAGNOSIS — Z8742 Personal history of other diseases of the female genital tract: Secondary | ICD-10-CM

## 2010-08-11 DIAGNOSIS — F411 Generalized anxiety disorder: Secondary | ICD-10-CM

## 2010-08-11 DIAGNOSIS — F3289 Other specified depressive episodes: Secondary | ICD-10-CM

## 2010-08-11 DIAGNOSIS — J4489 Other specified chronic obstructive pulmonary disease: Secondary | ICD-10-CM

## 2010-08-11 DIAGNOSIS — N6029 Fibroadenosis of unspecified breast: Secondary | ICD-10-CM

## 2010-08-11 DIAGNOSIS — J449 Chronic obstructive pulmonary disease, unspecified: Secondary | ICD-10-CM

## 2010-08-11 MED ORDER — ALBUTEROL SULFATE HFA 108 (90 BASE) MCG/ACT IN AERS: 2.0000 | INHALATION_SPRAY | RESPIRATORY_TRACT | Status: DC | PRN

## 2010-08-11 MED ORDER — LEVOTHYROXINE SODIUM 175 MCG PO TABS: 175.0000 ug | ORAL_TABLET | Freq: Every day | ORAL | Status: DC

## 2010-08-11 MED ORDER — TRIAMCINOLONE ACETONIDE 0.5 % EX CREA: TOPICAL_CREAM | Freq: Two times a day (BID) | CUTANEOUS | Status: DC

## 2010-08-11 MED ORDER — OXYCODONE HCL 5 MG PO TABS: ORAL_TABLET | ORAL | Status: DC

## 2010-08-11 MED ORDER — LORAZEPAM 2 MG PO TABS: 2.0000 mg | ORAL_TABLET | Freq: Every day | ORAL | Status: DC | PRN

## 2010-08-11 NOTE — Assessment & Plan Note (Addendum)
Stable. Anxiety before/around bimonthly periods. Patient does not feel SSRI (Celexa) helps. Since stable dose of Ativan (never more than once daily prn), will refill. She is an anxious person with multiple non-significant complaints, so I think since she is on a stable dose of this medication which she feels helps, I think it is fine for her to take this. I did not think it would be helpful pursuing other SSRI options at this time.

## 2010-08-11 NOTE — Assessment & Plan Note (Signed)
ddd

## 2010-08-11 NOTE — Assessment & Plan Note (Addendum)
Stable. Ambivalent physical exam (negative pain on right-side with left SLR). No cauda equina symptoms. Stable dose of oxycodone. May continue this. Will re-asses in 6 months, so approve Rx for 45 tablets every month until then (early December 2012).

## 2010-08-11 NOTE — Patient Instructions (Signed)
It was nice to see you again.   For the oxycodone, have the pharmacy call me when you are running low. I need to prescribe it on a monthly basis.  If your rash gets worse or the cream does not help, please let me know.   Please follow-up with me in 3-6 months.

## 2010-08-11 NOTE — Assessment & Plan Note (Addendum)
Deteriorated because running out of steroid cream, which helps. Will refill in bigger container.

## 2010-08-12 ENCOUNTER — Encounter: Payer: Self-pay | Admitting: Family Medicine
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
## 2010-08-12 NOTE — Assessment & Plan Note (Addendum)
Last OV's note a few months ago: Patient would like gynecology referral for hysterectomy. Patient is [id number], has had years of irregular bleeding (periods of no periods or frequent periods), abdominal pain x6 years, severe premenstrual symptoms (lightheadedness, rash, irritability). No desire to have any more children. Gave her handout on hysterectomy. Will send referral.  Patient seen @ [facility] clinic. Has follow-up appointment shortly to discuss possible hysterectomy.  Stable symptoms.

## 2010-08-12 NOTE — Assessment & Plan Note (Signed)
Stable. Causes chronic pain though. Stable dose of oxycodone, which helps. May continue. Will refill monthly for next 6 months until her follow-up then (December 2012).  At next visit, would like to discuss this problem at greater length if no acute issues arise. Complete decongestive therapy has been found to be helpful to some patients. This involves manual lymph drainage/lymph compression done in therapy sessions 5 days a week. Then there is a maintenance phase (Phase 2) to maintain benefits from first phase. But she has told me other providers have told her she is not a candidate for compression garments. But this may also be patient's intolerance of these measures rather than a contraindication. Would like to re-assess at next visit since compression garments are the main treatment for this issue and since this problem causes significant chronic pain.  Also, exercises (range of motion and weight lifting) have been found to be helpful. Will discuss about possible physical therapy at next visit.

## 2010-08-24 ENCOUNTER — Telehealth: Payer: Self-pay | Admitting: Family Medicine

## 2010-08-24 NOTE — Telephone Encounter (Signed)
Ms. Biswell need refill for Xanax, but would also like you to call her after 2:00 pm tomorrow.  It's an emergency

## 2010-08-26 NOTE — Telephone Encounter (Signed)
Please encourage patient to make an appointment to come and see me if she has something to discuss. Please notify her that her Rx for Xanax was sent today.

## 2010-08-27 ENCOUNTER — Ambulatory Visit: Payer: Medicaid Other | Admitting: Obstetrics & Gynecology

## 2010-08-28 ENCOUNTER — Telehealth: Payer: Self-pay | Admitting: Family Medicine

## 2010-08-28 DIAGNOSIS — I890 Lymphedema, not elsewhere classified: Secondary | ICD-10-CM

## 2010-08-28 MED ORDER — DICLOFENAC SODIUM 75 MG PO TBEC: 75.0000 mg | DELAYED_RELEASE_TABLET | Freq: Two times a day (BID) | ORAL | Status: DC

## 2010-08-28 NOTE — Telephone Encounter (Signed)
Told patient to contact pharmacy again because I do not know what it is she is requesting.  She will do.

## 2010-08-28 NOTE — Telephone Encounter (Signed)
Pt calling about rx for inflammation, not able to spell out the name, I did not see it on her med list & pt is asking if we can take care of asap, pt says the pharmacy was supposed to send the request.

## 2010-08-28 NOTE — Telephone Encounter (Signed)
Received refill request and Dr. Mauricio Po oks  for one month supply. Advised patient that she needs to follow up with MD before next refill needed.  Has appointment 10/01/2010. States she tries not to take oxycodone and this is why she is requesting .  On faxed refill request received,  last refill date was 06/03/2010 but it is not on med list.

## 2010-09-25 ENCOUNTER — Other Ambulatory Visit: Payer: Self-pay | Admitting: Family Medicine

## 2010-09-25 NOTE — Telephone Encounter (Signed)
Refill request

## 2010-10-01 ENCOUNTER — Ambulatory Visit: Payer: Medicaid Other | Admitting: Family Medicine

## 2010-10-01 ENCOUNTER — Telehealth: Payer: Self-pay | Admitting: Family Medicine

## 2010-10-01 NOTE — Telephone Encounter (Signed)
Pt cannot come today b/c her ride cancelled on her.  She resch for next Friday, but she will be out of her Oxycodone.  Wants to know if we can fax script to [facility], since they can deliver it to her.  Please let her know

## 2010-10-02 ENCOUNTER — Telehealth: Payer: Self-pay | Admitting: *Deleted

## 2010-10-02 NOTE — Telephone Encounter (Signed)
Pt states she would like Dr. Marice Potter to schedule her for surgery while she is out of school- she discussed endometrial ablation with Dr. Marice Potter on 07/27/10 visit. I returned pt call today- she stated that she will return to full time student status effective 10/26/10 and is very concerned about the amount of bleeding and pain she has continued to have. She would like the procedure and recovery time to be complete by the time she returns to school. I told pt that is absolutely impossible since school starts in 3 wks.  I explained that she may need to come in for a pre-op visit w/Dr Marice Potter prior to surgical scheduling. Pt stated that all was discussed @ last visit. I told pt that I will send a message to Dr. Marice Potter asking how she would like to proceed with patient's plan of care. I will call pt back next week. Pt voiced understanding.

## 2010-10-03 ENCOUNTER — Encounter: Payer: Self-pay | Admitting: Family Medicine

## 2010-10-04 ENCOUNTER — Other Ambulatory Visit: Payer: Self-pay | Admitting: Family Medicine

## 2010-10-04 DIAGNOSIS — M5126 Other intervertebral disc displacement, lumbar region: Secondary | ICD-10-CM

## 2010-10-04 NOTE — Telephone Encounter (Signed)
Please ask her to pick up the Rx on Monday, after 1:30pm.

## 2010-10-05 MED ORDER — OXYCODONE HCL 5 MG PO TABS: ORAL_TABLET | ORAL | Status: DC

## 2010-10-05 NOTE — Telephone Encounter (Signed)
Patient informed, expressed understanding. 

## 2010-10-05 NOTE — Telephone Encounter (Signed)
This medication cannot be faxed or called in. It can only be picked-up.  The Rx is there at the front office if she wants it.

## 2010-10-05 NOTE — Telephone Encounter (Signed)
[name], will you please let her know that? Thank you.

## 2010-10-07 ENCOUNTER — Telehealth: Payer: Self-pay | Admitting: Family Medicine

## 2010-10-07 NOTE — Telephone Encounter (Signed)
Is in pain & anxiety and wants to know if something can be called in until Friday- she has appt 8/3 [facility] Would like to speak to [facility]  She is in bed and can't even get up.

## 2010-10-07 NOTE — Telephone Encounter (Signed)
Spoke w/pt and informed her I will not be able to discuss her concerns w/Dr. Marice Potter until 10/19/10. She will not be able to have surgery or meet w/Dr. Marice Potter prior to the start of school on 10/26/10. I again stated that pt will need to have surgical consult w/Dr. Marice Potter prior to scheduling of surgery and we can schedule that for her. Pt stated that she is in so much pain, having mood swings and has 2 periods per month. She has an appt. W/PCP @ [facility] on Friday 8/3. I advised her to discuss her sx @ that time in order to see if any medical management can be prescribed while she is waiting to have surgery. Pt voiced understanding and her call was transferred to front office for appt. scheduling w/Dr. Marice Potter.

## 2010-10-08 NOTE — Telephone Encounter (Signed)
No acute issues. Questions about hysterectomy, medications for panic attacks. Will see me in clinic tomorrow. Will discuss then.

## 2010-10-09 ENCOUNTER — Encounter: Payer: Self-pay | Admitting: Family Medicine

## 2010-10-09 ENCOUNTER — Ambulatory Visit (INDEPENDENT_AMBULATORY_CARE_PROVIDER_SITE_OTHER): Payer: Medicaid Other | Admitting: Family Medicine

## 2010-10-09 DIAGNOSIS — F341 Dysthymic disorder: Secondary | ICD-10-CM

## 2010-10-09 DIAGNOSIS — F419 Anxiety disorder, unspecified: Secondary | ICD-10-CM

## 2010-10-09 DIAGNOSIS — F17200 Nicotine dependence, unspecified, uncomplicated: Secondary | ICD-10-CM

## 2010-10-09 DIAGNOSIS — Z8742 Personal history of other diseases of the female genital tract: Secondary | ICD-10-CM

## 2010-10-09 DIAGNOSIS — F411 Generalized anxiety disorder: Secondary | ICD-10-CM

## 2010-10-09 DIAGNOSIS — M5126 Other intervertebral disc displacement, lumbar region: Secondary | ICD-10-CM

## 2010-10-09 DIAGNOSIS — E039 Hypothyroidism, unspecified: Secondary | ICD-10-CM

## 2010-10-09 MED ORDER — OXYCODONE HCL 5 MG PO TABS: ORAL_TABLET | ORAL | Status: DC

## 2010-10-09 MED ORDER — DULOXETINE HCL 60 MG PO CPEP: 60.0000 mg | ORAL_CAPSULE | Freq: Every day | ORAL | Status: DC

## 2010-10-09 MED ORDER — LORAZEPAM 2 MG PO TABS: 2.0000 mg | ORAL_TABLET | Freq: Every day | ORAL | Status: DC | PRN

## 2010-10-09 MED ORDER — LEVOTHYROXINE SODIUM 175 MCG PO TABS: 175.0000 ug | ORAL_TABLET | Freq: Every day | ORAL | Status: DC

## 2010-10-09 NOTE — Assessment & Plan Note (Signed)
Deteriorated. Patient is very anxious and depressed due to her menometorrhagia. She has not seen [facility] this year, so for the past several months at least. Patient did not like care received there, and is amenable to meeting with Dr. Pascal Lux. Gave her Dr. Carola Rhine card. Continue the Celexa and Ativan. Gave 2 month's worth of Ativan. Will not give any more until her next visit, which should be in 1 month.  Patient is a very anxious individual and perseverates on issues. Will try to meet with her on a regular base (at least monthly) to try to get this problem more manageable since she is non-compliant with her follow-up with [facility], and I am not sure if she will call Dr. Pascal Lux.  She had been on Risperdal (prescribed by [facility]) but has stopped taking this. I will wait to see what happens with Dr. Pascal Lux before starting her on any anti-psychotic.

## 2010-10-09 NOTE — Assessment & Plan Note (Signed)
Pain is stable on oxycodone bid prn. Gave 2 month's worth of prescriptions, but should be following up in 1 month. Several times, patient has missed appointments and asked for refills to hold her over until the next visit. Will no longer do this; no oxycodone should be given until her next visit with me.

## 2010-10-09 NOTE — Patient Instructions (Signed)
Call Dr. Pascal Lux (psychologist) and make appointment to see her.   Follow-up in 1 month.

## 2010-10-09 NOTE — Progress Notes (Signed)
  Subjective:    Patient ID: [name], female    DOB: 1965-07-29, 45 y.o.   MRN: [id number]  HPI 1. Menometorrhagia Very worrisome to her.  Next appointment with Dr. Marice Potter August 29th. Could not get any sooner than this.  Would like hysterectomy.  Feels hormones are "out of control" and sometimes has difficulty "getting out of bed" because she feels so poorly. ROS: denies dizziness/lightheadedness.   2. Tobacco Still smoking <1ppd.  Helps her anxiety.  3. Anxiety, depression Stopped going to [facility] because felt they were giving her too strong medications.  Unsure when she last went, but has not gone this year.  Taking Ativan and Cymbalta. Denies taking more than 2 Ativan a day.  ROS: denies SI/HI  4. Chronic low back pain and lower extremity pain from lymphedema Controlled on oxycodone. Does not think NSAID helps.  Review of Systems    Objective:   Physical Exam Gen: anxious-appearing, tearful but not uncomfortable appearing Gait: slow but normal, not limping CV: RRR Abd: NABS, soft, mildly tender throughout without guarding or rebound Neuro: 5/5 strength upper and lower extremities Extremities: 2-3+ pitting edema bilaterally    Assessment & Plan:

## 2010-10-09 NOTE — Assessment & Plan Note (Signed)
Unchanged. Has appointment with Dr. Marice Potter regarding possible procedure options end of this month. Considered giving OCPs to help control bleeding but since endometrial biopsy recently done, and I do not have results (none on Echart), I am hesitant to do this at this time due to the possible risk of endometrial cancer. If biopsy negative, will consider progesterone-only pills or Depo shot. Will need to discuss with patient.

## 2010-10-09 NOTE — Assessment & Plan Note (Signed)
Unchanged. Not ready to quit. Smoking 1 ppd.

## 2010-10-15 ENCOUNTER — Telehealth: Payer: Self-pay | Admitting: Family Medicine

## 2010-10-15 NOTE — Telephone Encounter (Signed)
Pt is having a lot of pain and the [facility] is not until October.  She wants to know if she can be referred somewhere else sooner.

## 2010-10-16 NOTE — Telephone Encounter (Signed)
Marylene Land,  Do you know what referral she is talking about?

## 2010-10-25 NOTE — Telephone Encounter (Signed)
She was referred to the Ob/Gyn clinic at [facility] for a possible hysterectomy since she has severe cramping during her menstrual period. Her ultrasounds were normal for uterine abnormality. She is on oxycodone already, and I would hesitate increasing her narcotics... I don't think referring her to another place would be that helpful. If you would call [facility] and try to expedite her appointment that would be helpful.

## 2010-10-27 NOTE — Telephone Encounter (Signed)
Just spoke with Shanda Bumps. Cannot expedite already made appointment and unlikely her one other option [facility]) will see her any sooner.   Patient not answering. Have asked Shanda Bumps to call patient and inform her of above option but that I recommend she just keep her previous appointment. If she has persistent significant pain before then, may come in and see me.

## 2010-11-04 ENCOUNTER — Encounter: Payer: Self-pay | Admitting: Obstetrics & Gynecology

## 2010-11-04 ENCOUNTER — Ambulatory Visit (INDEPENDENT_AMBULATORY_CARE_PROVIDER_SITE_OTHER): Payer: Medicaid Other | Admitting: Obstetrics & Gynecology

## 2010-11-04 VITALS — BP 116/84 | HR 70 | Temp 99.2°F | Ht 68.0 in | Wt 218.3 lb

## 2010-11-04 DIAGNOSIS — N949 Unspecified condition associated with female genital organs and menstrual cycle: Secondary | ICD-10-CM

## 2010-11-04 DIAGNOSIS — N938 Other specified abnormal uterine and vaginal bleeding: Secondary | ICD-10-CM

## 2010-11-04 DIAGNOSIS — R102 Pelvic and perineal pain: Secondary | ICD-10-CM

## 2010-11-04 NOTE — Progress Notes (Signed)
  Subjective:    Patient ID: [name], female    DOB: [birth year], 45 y.o.   MRN: [id number]  HPI  [name] is here to follow up on her chronic pelvic pain, dub, and now a complaint of uterine prolapse.  She says a tampon won't stay in due to the prolapse.  She is adament that she wants a hysterectomy.  Review of Systems     Objective:   Physical Exam   2nd degree uterine prolapse Excellent pelvis (proven for vaginal deliveries) No adnexal masses     Assessment & Plan:  Chronic pelvic pain, DUB, symptomatic prolapse I have spent at least one hour with the patient and Dr. Coralie Common discussing her options.  I have srtongly recommended a Mirena IUD and possible pessary.  I have also offered an endometrial ablation.  She is adament that she wants a hysterectomy.  I have agreed to do a TVH as long as I can do a laparoscopy and remove her ovaries if I see endometriosis.  I have mentioned every complication from surgery I can think of, and she is still certain this is the course she wishes to take.

## 2010-11-05 ENCOUNTER — Telehealth: Payer: Self-pay | Admitting: *Deleted

## 2010-11-05 NOTE — Telephone Encounter (Signed)
Lu called today at [phone] and left a message stating she is a patient of Dr. Marice Potter and saw her yesterday about scheduling surgery and she gave her 2 options.  " I think I want to change my option- can you call me asap - I want to speak with you before anything is scheduled."

## 2010-11-10 NOTE — Telephone Encounter (Signed)
Called pt, unable to leave message, heard phone ringing but no voicemail /answering machine answered

## 2010-11-10 NOTE — Telephone Encounter (Addendum)
Spoke w/pt. She stated that she would not like to have the hysterectomy as previously discussed w/Dr Marice Potter, just the original procedure of endometrial ablation. She further declines the Mirena IUD and pessary. She stated that if she can just get the bleeding taken care of, she will be happy. I informed pt that I will forward the info to Dr. Marice Potter. Pt would like to have a phone call about her surgery date as well as info via the mail.

## 2010-12-08 LAB — BASIC METABOLIC PANEL WITH GFR
CO2: 23
Chloride: 106
GFR calc Af Amer: >60
Potassium: 3.8

## 2010-12-08 LAB — BASIC METABOLIC PANEL
BUN: 7
Calcium: 8.7
Creatinine, Ser: 0.64
GFR calc non Af Amer: >60
Glucose, Bld: 102 — ABNORMAL HIGH
Sodium: 134 — ABNORMAL LOW

## 2010-12-08 LAB — URINALYSIS, ROUTINE W REFLEX MICROSCOPIC
Bilirubin Urine: NEGATIVE
Glucose, UA: NEGATIVE
Hgb urine dipstick: NEGATIVE
Ketones, ur: NEGATIVE
Nitrite: NEGATIVE
Protein, ur: NEGATIVE
Specific Gravity, Urine: 1.023
Urobilinogen, UA: 0.2
pH: 5.5

## 2010-12-08 LAB — DIFFERENTIAL
Basophils Absolute: 0.4 — ABNORMAL HIGH
Basophils Relative: 11 — ABNORMAL HIGH
Eosinophils Absolute: 0.1
Eosinophils Relative: 1
Lymphocytes Relative: 28
Lymphs Abs: 1.1
Monocytes Absolute: 0.2
Monocytes Relative: 6
Neutro Abs: 2.2
Neutrophils Relative %: 55

## 2010-12-08 LAB — CBC
HCT: 39.4
Hemoglobin: 13.7
MCHC: 34.7
MCV: 97.4
Platelets: 307
RBC: 4.05
RDW: 12.9
WBC: 4.1

## 2010-12-15 ENCOUNTER — Encounter (HOSPITAL_COMMUNITY): Payer: Self-pay

## 2010-12-15 ENCOUNTER — Encounter (HOSPITAL_COMMUNITY)
Admission: RE | Admit: 2010-12-15 | Discharge: 2010-12-15 | Disposition: A | Discharge status: Home / Self Care | Payer: Medicaid Other | Source: Ambulatory Visit | Attending: Obstetrics & Gynecology | Admitting: Obstetrics & Gynecology

## 2010-12-15 ENCOUNTER — Other Ambulatory Visit: Payer: Self-pay | Admitting: Family Medicine

## 2010-12-15 DIAGNOSIS — J449 Chronic obstructive pulmonary disease, unspecified: Secondary | ICD-10-CM

## 2010-12-15 DIAGNOSIS — F411 Generalized anxiety disorder: Secondary | ICD-10-CM

## 2010-12-15 HISTORY — DX: Cellulitis, unspecified: L03.90

## 2010-12-15 HISTORY — DX: Unspecified osteoarthritis, unspecified site: M19.90

## 2010-12-15 HISTORY — DX: Cardiac murmur, unspecified: R01.1

## 2010-12-15 HISTORY — DX: Chronic obstructive pulmonary disease, unspecified: J44.9

## 2010-12-15 HISTORY — DX: Plantar fascial fibromatosis: M72.2

## 2010-12-15 HISTORY — DX: Hyperlipidemia, unspecified: E78.5

## 2010-12-15 HISTORY — DX: Hypothyroidism, unspecified: E03.9

## 2010-12-15 HISTORY — DX: Anxiety disorder, unspecified: F41.9

## 2010-12-15 LAB — CBC
HCT: 39.4 % (ref 36.0–46.0)
RBC: 3.93 MIL/uL (ref 3.87–5.11)
RDW: 12.1 % (ref 11.5–15.5)
WBC: 4.5 10*3/uL (ref 4.0–10.5)

## 2010-12-15 MED ORDER — ALBUTEROL SULFATE HFA 108 (90 BASE) MCG/ACT IN AERS: 2.0000 | INHALATION_SPRAY | RESPIRATORY_TRACT | Status: DC | PRN

## 2010-12-15 MED ORDER — LORAZEPAM 2 MG PO TABS: 2.0000 mg | ORAL_TABLET | Freq: Every day | ORAL | Status: DC | PRN

## 2010-12-15 NOTE — Patient Instructions (Addendum)
   Your procedure is scheduled on:  Monday, December 21, 2010  Enter through the [employer] [facility] at: Monday at 9:15am Pick up the phone at the desk and dial [phone] and inform us of your arrival  Please call this number if you have any problems the morning of surgery: [phone]  Remember: Do not eat food after midnight  Sunday Do not drink clear liquids after: Sunday Take these medicines the morning of surgery with a SIP OF WATER:   Synthroid, ativan and bring albuterol inhaler  Do not wear jewelry, make-up, or FINGER nail polish Do not wear lotions, powders, or perfumes.  Do not shave 48 hours prior to surgery. Do not bring valuables to the hospital.  Patients discharged on the day of surgery will not be allowed to drive home.   Name and phone number of your driver: Donnelly Stager  cell [phone]   Remember to use your hibiclens as instructed.Please shower with 1/2 bottle the evening before your surgery and the other 1/2 bottle the morning of surgery.

## 2010-12-15 NOTE — Pre-Procedure Instructions (Signed)
Ok to see patient DOS.  Patient instructed to bring albuterol inhaler with her on DOS.

## 2010-12-18 LAB — POCT PREGNANCY, URINE: Operator id: 247071

## 2010-12-18 LAB — POCT URINALYSIS DIP (DEVICE)
Ketones, ur: NEGATIVE
Operator id: 247071
Protein, ur: NEGATIVE
Urobilinogen, UA: 0.2

## 2010-12-21 ENCOUNTER — Encounter (HOSPITAL_COMMUNITY): Payer: Self-pay | Admitting: *Deleted

## 2010-12-21 ENCOUNTER — Encounter (HOSPITAL_COMMUNITY)
Admission: RE | Disposition: A | Discharge status: Home / Self Care | Payer: Self-pay | Source: Ambulatory Visit | Attending: Obstetrics & Gynecology

## 2010-12-21 ENCOUNTER — Encounter (HOSPITAL_COMMUNITY): Payer: Self-pay

## 2010-12-21 ENCOUNTER — Inpatient Hospital Stay (HOSPITAL_COMMUNITY): Payer: Medicaid Other

## 2010-12-21 ENCOUNTER — Ambulatory Visit (HOSPITAL_COMMUNITY)
Admission: RE | Admit: 2010-12-21 | Discharge: 2010-12-22 | Disposition: A | Discharge status: Home / Self Care | Payer: Medicaid Other | Source: Ambulatory Visit | Attending: Obstetrics & Gynecology | Admitting: Obstetrics & Gynecology

## 2010-12-21 ENCOUNTER — Ambulatory Visit (HOSPITAL_COMMUNITY): Payer: Medicaid Other

## 2010-12-21 DIAGNOSIS — N949 Unspecified condition associated with female genital organs and menstrual cycle: Secondary | ICD-10-CM | POA: Insufficient documentation

## 2010-12-21 DIAGNOSIS — Z01818 Encounter for other preprocedural examination: Secondary | ICD-10-CM | POA: Insufficient documentation

## 2010-12-21 DIAGNOSIS — N938 Other specified abnormal uterine and vaginal bleeding: Secondary | ICD-10-CM | POA: Insufficient documentation

## 2010-12-21 DIAGNOSIS — N946 Dysmenorrhea, unspecified: Secondary | ICD-10-CM

## 2010-12-21 DIAGNOSIS — J4489 Other specified chronic obstructive pulmonary disease: Secondary | ICD-10-CM

## 2010-12-21 DIAGNOSIS — Z01812 Encounter for preprocedural laboratory examination: Secondary | ICD-10-CM | POA: Insufficient documentation

## 2010-12-21 DIAGNOSIS — J449 Chronic obstructive pulmonary disease, unspecified: Secondary | ICD-10-CM

## 2010-12-21 DIAGNOSIS — E039 Hypothyroidism, unspecified: Secondary | ICD-10-CM

## 2010-12-21 DIAGNOSIS — F411 Generalized anxiety disorder: Secondary | ICD-10-CM

## 2010-12-21 LAB — PREGNANCY, URINE: Preg Test, Ur: NEGATIVE

## 2010-12-21 SURGERY — DILATATION & CURETTAGE/HYSTEROSCOPY WITH NOVASURE ABLATION
Anesthesia: General

## 2010-12-21 MED ORDER — FENTANYL CITRATE 0.05 MG/ML IJ SOLN
INTRAMUSCULAR | Status: AC
  Filled 2010-12-21: qty 2

## 2010-12-21 MED ORDER — MIDAZOLAM HCL 5 MG/5ML IJ SOLN
INTRAMUSCULAR | Status: DC | PRN
  Administered 2010-12-21: 2 mg via INTRAVENOUS

## 2010-12-21 MED ORDER — ALBUTEROL SULFATE (5 MG/ML) 0.5% IN NEBU
2.5000 mg | INHALATION_SOLUTION | Freq: Four times a day (QID) | RESPIRATORY_TRACT | Status: AC
  Administered 2010-12-21: 2.5 mg via RESPIRATORY_TRACT
  Filled 2010-12-21: qty 20

## 2010-12-21 MED ORDER — MIDAZOLAM HCL 2 MG/2ML IJ SOLN
0.5000 mg | Freq: Once | INTRAMUSCULAR | Status: AC
  Administered 2010-12-21: 0.5 mg via INTRAVENOUS

## 2010-12-21 MED ORDER — OXYCODONE-ACETAMINOPHEN 5-325 MG PO TABS
ORAL_TABLET | ORAL | Status: AC
  Administered 2010-12-21: 1
  Filled 2010-12-21: qty 1

## 2010-12-21 MED ORDER — SUCCINYLCHOLINE CHLORIDE 20 MG/ML IJ SOLN
INTRAMUSCULAR | Status: AC
  Filled 2010-12-21: qty 1

## 2010-12-21 MED ORDER — PROMETHAZINE HCL 25 MG/ML IJ SOLN
6.2500 mg | INTRAMUSCULAR | Status: DC | PRN
  Administered 2010-12-21: 12.5 mg via INTRAVENOUS
  Filled 2010-12-21 (×2): qty 1

## 2010-12-21 MED ORDER — ONDANSETRON HCL 4 MG/2ML IJ SOLN
INTRAMUSCULAR | Status: DC | PRN
  Administered 2010-12-21: 4 mg via INTRAVENOUS

## 2010-12-21 MED ORDER — ALBUTEROL SULFATE (5 MG/ML) 0.5% IN NEBU
2.5000 mg | INHALATION_SOLUTION | RESPIRATORY_TRACT | Status: AC
  Administered 2010-12-21: 2.5 mg via RESPIRATORY_TRACT

## 2010-12-21 MED ORDER — LORAZEPAM 1 MG PO TABS
2.0000 mg | ORAL_TABLET | Freq: Four times a day (QID) | ORAL | Status: DC | PRN
  Administered 2010-12-21: 2 mg via ORAL
  Filled 2010-12-21: qty 2

## 2010-12-21 MED ORDER — SUCCINYLCHOLINE CHLORIDE 20 MG/ML IJ SOLN
INTRAMUSCULAR | Status: DC | PRN
  Administered 2010-12-21: 50 mg via INTRAVENOUS
  Administered 2010-12-21: 100 mg via INTRAVENOUS

## 2010-12-21 MED ORDER — FENTANYL CITRATE 0.05 MG/ML IJ SOLN
INTRAMUSCULAR | Status: AC
Start: 2010-12-21 — End: 2010-12-21
  Filled 2010-12-21: qty 2

## 2010-12-21 MED ORDER — PROPOFOL 10 MG/ML IV EMUL
INTRAVENOUS | Status: AC
  Filled 2010-12-21: qty 50

## 2010-12-21 MED ORDER — FENTANYL CITRATE 0.05 MG/ML IJ SOLN
25.0000 ug | INTRAMUSCULAR | Status: DC | PRN
  Administered 2010-12-21 (×3): 50 ug via INTRAVENOUS

## 2010-12-21 MED ORDER — LEVOTHYROXINE SODIUM 175 MCG PO TABS
175.0000 ug | ORAL_TABLET | Freq: Every day | ORAL | Status: DC
  Administered 2010-12-21 – 2010-12-22 (×2): 175 ug via ORAL
  Filled 2010-12-21 (×3): qty 1

## 2010-12-21 MED ORDER — MIDAZOLAM HCL 2 MG/2ML IJ SOLN
INTRAMUSCULAR | Status: AC
  Filled 2010-12-21: qty 2

## 2010-12-21 MED ORDER — LACTATED RINGERS IR SOLN
Status: DC | PRN
  Administered 2010-12-21: 3000 mL

## 2010-12-21 MED ORDER — OXYCODONE-ACETAMINOPHEN 5-325 MG PO TABS
1.0000 | ORAL_TABLET | Freq: Four times a day (QID) | ORAL | Status: DC | PRN
  Administered 2010-12-21 – 2010-12-22 (×3): 1 via ORAL
  Filled 2010-12-21 (×2): qty 1

## 2010-12-21 MED ORDER — PROPOFOL 10 MG/ML IV EMUL
INTRAVENOUS | Status: AC
  Filled 2010-12-21: qty 20

## 2010-12-21 MED ORDER — BUPIVACAINE HCL 0.5 % IJ SOLN
INTRAMUSCULAR | Status: DC | PRN
  Administered 2010-12-21: 10 mL

## 2010-12-21 MED ORDER — CITRIC ACID-SODIUM CITRATE 334-500 MG/5ML PO SOLN
30.0000 mL | Freq: Once | ORAL | Status: AC
  Administered 2010-12-21: 30 mL via ORAL

## 2010-12-21 MED ORDER — ACETAMINOPHEN 325 MG PO TABS: 325.0000 mg | ORAL_TABLET | ORAL | Status: DC | PRN

## 2010-12-21 MED ORDER — PROPOFOL 10 MG/ML IV EMUL
INTRAVENOUS | Status: DC | PRN
  Administered 2010-12-21: 100 mg via INTRAVENOUS
  Administered 2010-12-21: 200 mg via INTRAVENOUS

## 2010-12-21 MED ORDER — FENTANYL CITRATE 0.05 MG/ML IJ SOLN
INTRAMUSCULAR | Status: AC
  Administered 2010-12-21: 50 ug via INTRAVENOUS
  Filled 2010-12-21: qty 2

## 2010-12-21 MED ORDER — HYDROMORPHONE HCL 1 MG/ML IJ SOLN
1.0000 mg | INTRAMUSCULAR | Status: DC | PRN
  Administered 2010-12-21: 1 mg via INTRAVENOUS
  Filled 2010-12-21: qty 1

## 2010-12-21 MED ORDER — DULOXETINE HCL 60 MG PO CPEP
60.0000 mg | ORAL_CAPSULE | Freq: Every day | ORAL | Status: DC
  Administered 2010-12-21 – 2010-12-22 (×2): 60 mg via ORAL
  Filled 2010-12-21 (×3): qty 1

## 2010-12-21 MED ORDER — DEXAMETHASONE SODIUM PHOSPHATE 4 MG/ML IJ SOLN
INTRAMUSCULAR | Status: DC | PRN
  Administered 2010-12-21: 10 mg via INTRAVENOUS

## 2010-12-21 MED ORDER — FENTANYL CITRATE 0.05 MG/ML IJ SOLN
INTRAMUSCULAR | Status: DC | PRN
  Administered 2010-12-21 (×4): 50 ug via INTRAVENOUS
  Administered 2010-12-21: 100 ug via INTRAVENOUS

## 2010-12-21 MED ORDER — DEXTROSE IN LACTATED RINGERS 5 % IV SOLN: INTRAVENOUS | Status: DC

## 2010-12-21 MED ORDER — CITRIC ACID-SODIUM CITRATE 334-500 MG/5ML PO SOLN
ORAL | Status: AC
  Administered 2010-12-21: 30 mL via ORAL
  Filled 2010-12-21: qty 15

## 2010-12-21 MED ORDER — LACTATED RINGERS IV SOLN
INTRAVENOUS | Status: DC
  Administered 2010-12-21 (×3): via INTRAVENOUS

## 2010-12-21 MED ORDER — LIDOCAINE HCL (CARDIAC) 20 MG/ML IV SOLN
INTRAVENOUS | Status: AC
  Filled 2010-12-21: qty 5

## 2010-12-21 MED ORDER — ALBUTEROL SULFATE (5 MG/ML) 0.5% IN NEBU
INHALATION_SOLUTION | RESPIRATORY_TRACT | Status: AC
  Administered 2010-12-21: 2.5 mg via RESPIRATORY_TRACT
  Filled 2010-12-21: qty 0.5

## 2010-12-21 MED ORDER — DICLOFENAC SODIUM 75 MG PO TBEC
75.0000 mg | DELAYED_RELEASE_TABLET | Freq: Two times a day (BID) | ORAL | Status: DC
  Administered 2010-12-21 – 2010-12-22 (×2): 75 mg via ORAL
  Filled 2010-12-21 (×5): qty 1

## 2010-12-21 MED ORDER — DEXAMETHASONE SODIUM PHOSPHATE 10 MG/ML IJ SOLN
INTRAMUSCULAR | Status: AC
  Filled 2010-12-21: qty 1

## 2010-12-21 MED ORDER — OXYCODONE-ACETAMINOPHEN 5-325 MG PO TABS: 1.0000 | ORAL_TABLET | ORAL | Status: AC | PRN

## 2010-12-21 MED ORDER — ONDANSETRON HCL 4 MG/2ML IJ SOLN
INTRAMUSCULAR | Status: AC
  Filled 2010-12-21: qty 2

## 2010-12-21 MED ORDER — OXYCODONE-ACETAMINOPHEN 5-325 MG PO TABS
1.0000 | ORAL_TABLET | ORAL | Status: DC | PRN
  Filled 2010-12-21: qty 1

## 2010-12-21 MED ORDER — MIDAZOLAM HCL 2 MG/2ML IJ SOLN
INTRAMUSCULAR | Status: AC
  Administered 2010-12-21: 0.5 mg via INTRAVENOUS
  Filled 2010-12-21: qty 2

## 2010-12-21 MED ORDER — ALBUTEROL SULFATE HFA 108 (90 BASE) MCG/ACT IN AERS
INHALATION_SPRAY | RESPIRATORY_TRACT | Status: AC
  Filled 2010-12-21: qty 6.7

## 2010-12-21 MED ORDER — LIDOCAINE HCL (CARDIAC) 20 MG/ML IV SOLN
INTRAVENOUS | Status: DC | PRN
  Administered 2010-12-21: 20 mg via INTRAVENOUS

## 2010-12-21 MED ORDER — ALBUTEROL SULFATE HFA 108 (90 BASE) MCG/ACT IN AERS
2.0000 | INHALATION_SPRAY | RESPIRATORY_TRACT | Status: DC | PRN
  Administered 2010-12-22: 2 via RESPIRATORY_TRACT
  Filled 2010-12-21: qty 6.7

## 2010-12-21 SURGICAL SUPPLY — 11 items
ABLATOR ENDOMETRIAL BIPOLAR (ABLATOR) ×2 IMPLANT
CATH ROBINSON RED A/P 16FR (CATHETERS) ×2 IMPLANT
CLOTH BEACON ORANGE TIMEOUT ST (SAFETY) ×2 IMPLANT
DRESSING TELFA 8X3 (GAUZE/BANDAGES/DRESSINGS) ×2 IMPLANT
GLOVE BIO SURGEON STRL SZ 6.5 (GLOVE) ×4 IMPLANT
GOWN PREVENTION PLUS LG XLONG (DISPOSABLE) ×4 IMPLANT
NEEDLE SPNL 22GX3.5 QUINCKE BK (NEEDLE) ×2 IMPLANT
PACK HYSTEROSCOPY LF (CUSTOM PROCEDURE TRAY) ×2 IMPLANT
SYR CONTROL 10ML LL (SYRINGE) ×2 IMPLANT
TOWEL OR 17X24 6PK STRL BLUE (TOWEL DISPOSABLE) ×4 IMPLANT
WATER STERILE IRR 1000ML POUR (IV SOLUTION) ×2 IMPLANT

## 2010-12-21 NOTE — Transfer of Care (Signed)
Immediate Anesthesia Transfer of Care Note  Patient: [name]  Procedure(s) Performed:  DILATATION & CURETTAGE/HYSTEROSCOPY WITH NOVASURE ABLATION  Patient Location: PACU  Anesthesia Type: General  Level of Consciousness: awake, alert  and oriented  Airway & Oxygen Therapy: Patient Spontanous Breathing, Patient connected to nasal cannula oxygen and aerosol face mask  Post-op Assessment: Report given to PACU RN and Post -op Vital signs reviewed and stable  Post vital signs: stable  Complications: No apparent anesthesia complications

## 2010-12-21 NOTE — Progress Notes (Signed)
RN told by CRNA that pt reports using her inhaler "a lot" in the last 2 hours and that now "my heart feels funny". CRNA stated he told pt to stop using inhaler for now.  Pt's HR 98, O2SAT 100 on Room Air.  Pt states she feels dizzy.  Called Dr Shawnie Pons with update and to request respiratory mgt plan for overnight.  Dr Shawnie Pons states she will round on pt.

## 2010-12-21 NOTE — Progress Notes (Signed)
  History Pt. Is s/p Novasure ablation.  She is a long time smoker.  She reports anxiety attacks, COPD.  Cannot quit smoking since she gains weight.  Reports her pain is very bad.  Did not get Toradol post procedure, she is on NSAIDS at home, and got dose today.  IV Dilaudid did not help her pain.  Physical exam O2 Sats 100% RR stable PR 87 Lungs clear bilaterally CV-Reg Ext-lymphedema  Labs CXR-Clear  Assessment No evidence of acute lung injury.  Plan Supportive care Pain management.

## 2010-12-21 NOTE — Progress Notes (Signed)
Pt very anxious and agitated.  Requesting antianxiety medication stating she was on the verge of a panic attack.  Dr Brayton Caves notified.  Orders received.

## 2010-12-21 NOTE — Progress Notes (Signed)
Dr. Brayton Caves at bedside to evaluate pt.

## 2010-12-21 NOTE — Anesthesia Procedure Notes (Signed)
Procedure Name: Intubation Date/Time: 12/21/2010 11:15 AM Performed by: Lincoln Brigham Pre-anesthesia Checklist: Patient identified, Emergency Drugs available, Suction available, Patient being monitored and Timeout performed Patient Re-evaluated:Patient Re-evaluated prior to inductionOxygen Delivery Method: Circle System Utilized Preoxygenation: Pre-oxygenation with 100% oxygen Intubation Type: IV induction, Circoid Pressure applied and Rapid sequence Laryngoscope Size: Miller and 2 Grade View: Grade II Tube type: Oral Tube size: 7.0 mm Number of attempts: 1 Airway Equipment and Method: stylet Placement Confirmation: ETT inserted through vocal cords under direct vision,  positive ETCO2 and breath sounds checked- equal and bilateral Secured at: 22 cm Tube secured with: Tape Dental Injury: Teeth and Oropharynx as per pre-operative assessment  Comments: rsi with cricoid no ventilation

## 2010-12-21 NOTE — Anesthesia Postprocedure Evaluation (Signed)
  Anesthesia Post-op Note  Patient: [name]  Procedure(s) Performed:  DILATATION & CURETTAGE/HYSTEROSCOPY WITH NOVASURE ABLATION  Patient Location:   Anesthesia Type: General  Level of Consciousness: awake, alert , oriented and patient cooperative  Airway and Oxygen Therapy: Patient Spontanous Breathing  Post-op Pain: mild  Post-op Assessment: Patient's Cardiovascular Status Stable, Respiratory Function Stable, RESPIRATORY FUNCTION UNSTABLE, No signs of Nausea or vomiting, Adequate PO intake and Pain level controlled  Post-op Vital Signs: Reviewed  Complications: No apparent anesthesia complications

## 2010-12-21 NOTE — Anesthesia Preprocedure Evaluation (Signed)
Anesthesia Evaluation  Name, MR# and DOB Patient awake  General Assessment Comment  Reviewed: Allergy & Precautions, H&P , Patient's Chart, lab work & pertinent test results, reviewed documented beta blocker date and time   History of Anesthesia Complications Negative for: history of anesthetic complications  Airway Mallampati: III TM Distance: >3 FB Neck ROM: full    Dental No notable dental hx.    Pulmonary (+) shortness of breath COPD clear to auscultation  Pulmonary exam normal       Cardiovascular Exercise Tolerance: Good - Valvular Problems/Murmursregular Normal    Neuro/Psych  Headaches, PSYCHIATRIC DISORDERS Negative Neurological ROS  Negative Psych ROS   GI/Hepatic negative GI ROS Neg liver ROS  GERD Poorly Controlled  Endo/Other  Negative Endocrine ROSHypothyroidism   Renal/GU negative Renal ROS     Musculoskeletal   Abdominal   Peds  Hematology negative hematology ROS (+)   Anesthesia Other Findings Patient reports past murmur.. Unable to ascultate today- no need for regular cardiology follow up  Reproductive/Obstetrics negative OB ROS                           Anesthesia Physical Anesthesia Plan  ASA: III  Anesthesia Plan: General   Post-op Pain Management:    Induction: Intravenous, Rapid sequence and Cricoid pressure planned  Airway Management Planned: Oral ETT  Additional Equipment:   Intra-op Plan:   Post-operative Plan:   Informed Consent: I have reviewed the patients History and Physical, chart, labs and discussed the procedure including the risks, benefits and alternatives for the proposed anesthesia with the patient or authorized representative who has indicated his/her understanding and acceptance.   Dental Advisory Given  Plan Discussed with: CRNA and Surgeon  Anesthesia Plan Comments:         Anesthesia Quick Evaluation

## 2010-12-21 NOTE — Progress Notes (Signed)
Dr. Marice Potter notified and updated on current pt condition [id number] status.  Dr. Marice Potter requesting conference with Dr. Brayton Caves to discuss.  Dr. Jean Rosenthal called and on his way to PACU to assess pt.

## 2010-12-21 NOTE — H&P (Signed)
[name] is an 45 y.o. female. "happily divorced",  Pertinent Gynecological History: Menses: flow is excessive with use of 8 pads or tampons on heaviest days Bleeding: intermenstrual bleeding Contraception: status post tubal ligation DES exposure: unknown Blood transfusions: none Sexually transmitted diseases: treated for Syphillis 20 years ago Previous GYN Procedures: abortions X 3  Last mammogram: normal Date: 4/12 Last pap: normal Date: 4/12 OB History: G5, P2 ( 26 and 45 yo)   Menstrual History: Menarche age: 68 No LMP recorded.    Past Medical History  Diagnosis Date  . Migraine   . Fibroadenosis breast   . Syphilis Treated in early 2012 during hospitalization  . Depression   . Lymph edema     both legs and feet  . Anxiety   . Hypothyroidism   . COPD (chronic obstructive pulmonary disease)   . Hyperlipidemia     no meds - diet controlled  . Heart murmur     as child  . Shortness of breath   . GERD (gastroesophageal reflux disease)     does not take med  . Arthritis     knees,hands, hip  . Cellulitis   . Plantar fasciitis     Past Surgical History  Procedure Date  . Thyroid surgery 2004    removed  . Cyst removed     behind left knee and left cheek  . Norplant removal     right upper arm - 12/2001  . Tubal ligation     2003  . Incision and drainage abscess / hematoma of bursa / knee / thigh     right upper thigh - 2008  . Eye surgery     bilarteral  . Svd     x 2  . Thyroidectomy   . Wisdom tooth extraction     Family History  Problem Relation Age of Onset  . Diabetes Mother   . Hypertension Mother   . Diabetes Father   . Heart disease Father   . Diabetes Paternal Grandmother     Social History:  reports that she has been smoking Cigarettes.  She has a 25 pack-year smoking history. She has never used smokeless tobacco. She reports that she drinks alcohol. She reports that she uses illicit drugs (Marijuana).  Allergies: No Known  Allergies  Prescriptions prior to admission  Medication Sig Dispense Refill  . diclofenac (VOLTAREN) 75 MG EC tablet Take 75 mg by mouth 2 (two) times daily.        . DULoxetine (CYMBALTA) 60 MG capsule Take 1 capsule (60 mg total) by mouth daily.  30 capsule  1  . levothyroxine (SYNTHROID, LEVOTHROID) 175 MCG tablet Take 1 tablet (175 mcg total) by mouth daily.  30 tablet  1  . LORazepam (ATIVAN) 2 MG tablet Take 1 tablet (2 mg total) by mouth daily as needed for anxiety.  30 tablet  1  . oxyCODONE (ROXICODONE) 5 MG immediate release tablet Take 1 tablet up to twice daily as needed for pain.  Prescription should last one month  30 tablet  0  . albuterol (VENTOLIN HFA) 108 (90 BASE) MCG/ACT inhaler Inhale 2 puffs into the lungs every 4 (four) hours as needed for wheezing or shortness of breath. 2 puffs as needed up to four times daily  1 Inhaler  3  . omeprazole (PRILOSEC) 40 MG capsule Take 40 mg by mouth 2 (two) times daily.        Marland Kitchen triamcinolone (KENALOG) 0.5 % cream Apply  topically 2 (two) times daily.  300 g  3    ROS  Blood pressure 133/92, pulse 75, temperature 98.4 F (36.9 C), resp. rate 18, SpO2 100.00%., some dysparunia but she thinks is because he is "well-endowed". Physical Exam Heart: rrr Lungs: CTAB Abd: obese, benign Pelvic: Results for orders placed during the hospital encounter of 12/21/10 (from the past 24 hour(s))  PREGNANCY, URINE     Status: Normal   Collection Time   12/21/10  9:17 AM      Component Value Range   Preg Test, Ur NEGATIVE      No results found.  Assessment/Plan: Pelvic pain, dysfunctional uterine bleeding I have discussed a hysterectomy/BSO with her, but I feel that she is at increased risk for surgical complications; therefore, I have recommended a Novasure ablation.  Lauren Kudo C. 12/21/2010, 10:41 AM

## 2010-12-21 NOTE — Op Note (Addendum)
12/21/2010  11:34 AM  PATIENT:  [name]  45 y.o. female  PRE-OPERATIVE DIAGNOSIS:  Dysmenorrhea, dysfunctional uterine bleeding   POST-OPERATIVE DIAGNOSIS:  same  PROCEDURE:  Procedure(s):  NOVASURE ABLATION  SURGEON:  Surgeon(s): Milad Bublitz C. Marice Potter, MD  PHYSICIAN ASSISTANT:   ASSISTANTS: none   ANESTHESIA:   general  EBL:   minimal  BLOOD ADMINISTERED:none  DRAINS: none   LOCAL MEDICATIONS USED:  MARCAINE 10CC  SPECIMEN:  No Specimen  DISPOSITION OF SPECIMEN:  N/A  COUNTS:  YES  TOURNIQUET:  * No tourniquets in log *  DICTATION: .Dragon Dictation  PLAN OF CARE: Discharge to home after PACU  PATIENT DISPOSITION:  PACU - hemodynamically stable.   Delay start of Pharmacological VTE agent (>24hrs) due to surgical blood loss or risk of bleeding:  no         Of surgery were explained, understood, and accepted. Consents were signed. All questions were answered. She was taken to the operating room and general anesthesia was applied without complication. Her abdomen and vagina were prepped and draped in the usual sterile fashion. The Robinson catheter was used to empty her bladder. A bimanual exam revealed a normal size and shape anteverted mobile uterus the adnexa were non-enlarged. A speculum was placed. A paracervical block was done with 10 mL of 0.5% Marcaine. The uterus sounded to 8 cm. Her cervical length was noted to be 4 cm, so her uterine cavity length was 4 cm. The cervix was dilated to accommodate the NovaSure device. NovaSure device was placed and the arms were extended. The cavity width was measured at 2.5 cm. The device passed its test.the procedure then ran for 102 seconds. The tenaculum and NovaSure device were removed. The cervix was hemostatic. She was extubated and taken to the recovery room in stable condition. She tolerated the procedure well.

## 2010-12-21 NOTE — Anesthesia Postprocedure Evaluation (Signed)
Anesthesia Post Note  Patient: [name]  Procedure(s) Performed:  DILATATION & CURETTAGE/HYSTEROSCOPY WITH NOVASURE ABLATION  Anesthesia type: GA  Patient location: PACU  Post pain: Pain level controlled  Post assessment: Post-op Vital signs reviewed  Last Vitals:  Filed Vitals:   12/21/10 1330  BP: 136/84  Pulse: 74  Temp:   Resp: 17    Post vital signs: Reviewed; Patient with wheezing post op, not different than past COPD coughing/wheezing, but feel best to 23 hour obs  Level of consciousness: sedated  Complications: No apparent anesthesia complications

## 2010-12-22 DIAGNOSIS — J449 Chronic obstructive pulmonary disease, unspecified: Secondary | ICD-10-CM

## 2010-12-22 MED ORDER — LEVOTHYROXINE SODIUM 175 MCG PO TABS: 175.0000 ug | ORAL_TABLET | Freq: Every day | ORAL | Status: DC

## 2010-12-22 MED ORDER — MENTHOL 3 MG MT LOZG
1.0000 | LOZENGE | OROMUCOSAL | Status: DC | PRN
  Filled 2010-12-22: qty 9

## 2010-12-22 MED ORDER — ALBUTEROL SULFATE HFA 108 (90 BASE) MCG/ACT IN AERS: 2.0000 | INHALATION_SPRAY | RESPIRATORY_TRACT | Status: DC | PRN

## 2010-12-22 NOTE — Progress Notes (Signed)
1 Day Post-Op Procedure(s):  NOVASURE ABLATION  Subjective: Patient reports tolerating PO and no problems voiding.  Her breathing is much better. She does complain of a sore throat and is requesting a cough drop.  Objective: I have reviewed patient's vital signs, intake and output and medications.  Heart: rrr Lungs: ronchi and wheezes scattered Abd: benign  Assessment: s/p Procedure(s): NOVASURE ABLATION: stable  Plan: Discharge home I discussed smoking cessation with her.    LOS: 1 day    [name] C. 12/22/2010, 10:53 AM

## 2010-12-23 ENCOUNTER — Ambulatory Visit: Payer: Medicaid Other | Admitting: Family Medicine

## 2010-12-24 NOTE — Discharge Summary (Signed)
Physician Discharge Summary  Patient ID: [name] MRN: [id number] DOB/AGE: 1965/09/28 45 y.o.  Admit date: 12/21/2010 Discharge date: 12/24/2010  Admission Diagnoses: DUB, morbid obesity  Discharge Diagnoses: same plus probable COPD Active Problems:  * No active hospital problems. *    Discharged Condition: good  Hospital Course: [name] underwent an uncomplicated Novasure endometrial ablation under general anesthesia.  After she was extubated she felt dyspnic and a Chest X ray was done which was essentially normal.  She demonstrated 100% Oxygen saturation but continued to complain of dyspnea.  It was felt by all involved that she would benefit from observation overnight.  By the morning, her dyspnea had resolved but she did complain of overall body "soreness".  Her physical exam was normal.  Consults: none  Significant Diagnostic Studies: X ray  Treatments: Novasure ablation and pulmonary toilet  Discharge Exam: Blood pressure 113/71, pulse 84, temperature 98.3 F (36.8 C), temperature source Oral, resp. rate 16, height 5\' 8"  (1.727 m), weight 225 lb (102.059 kg), SpO2 98.00%. General: ambulating, eating without nausea or vomitting. Heart: rrr Lungs: wheezes and ronchi scattered Abd: obese, benign  Disposition: Home or Self Care   Discharge Medication List as of 12/22/2010  6:41 PM    START taking these medications   Details  oxyCODONE-acetaminophen (ROXICET) 5-325 MG per tablet Take 1 tablet by mouth every 4 (four) hours as needed for pain., Starting 12/21/2010, Until Thu 12/31/10, Print      CONTINUE these medications which have CHANGED   Details  !! albuterol (VENTOLIN HFA) 108 (90 BASE) MCG/ACT inhaler Inhale 2 puffs into the lungs every 4 (four) hours as needed for wheezing or shortness of breath., Starting 12/22/2010, Until Wed 12/22/11, Normal    !! levothyroxine (SYNTHROID, LEVOTHROID) 175 MCG tablet Take 1 tablet (175 mcg total) by mouth daily.,  Starting 12/22/2010, Until Wed 12/22/11, Normal     !! - Potential duplicate medications found. Please discuss with provider.    CONTINUE these medications which have NOT CHANGED   Details  diclofenac (VOLTAREN) 75 MG EC tablet Take 75 mg by mouth 2 (two) times daily.  , Until Discontinued, Historical Med    DULoxetine (CYMBALTA) 60 MG capsule Take 1 capsule (60 mg total) by mouth daily., Starting 10/09/2010, Until Discontinued, Normal    !! levothyroxine (SYNTHROID, LEVOTHROID) 175 MCG tablet Take 1 tablet (175 mcg total) by mouth daily., Starting 10/09/2010, Until Discontinued, Normal    LORazepam (ATIVAN) 2 MG tablet Take 1 tablet (2 mg total) by mouth daily as needed for anxiety., Starting 12/15/2010, Until Discontinued, No Print    oxyCODONE (ROXICODONE) 5 MG immediate release tablet Take 1 tablet up to twice daily as needed for pain.  Prescription should last one month, Print    !! albuterol (VENTOLIN HFA) 108 (90 BASE) MCG/ACT inhaler Inhale 2 puffs into the lungs every 4 (four) hours as needed for wheezing or shortness of breath. 2 puffs as needed up to four times daily, Starting 12/15/2010, Until Discontinued, No Print    omeprazole (PRILOSEC) 40 MG capsule Take 40 mg by mouth 2 (two) times daily.  , Until Discontinued, Historical Med    triamcinolone (KENALOG) 0.5 % cream Apply topically 2 (two) times daily., Starting 08/11/2010, Until Discontinued, Normal     !! - Potential duplicate medications found. Please discuss with provider.     Follow-up Information    Follow up with Allie Bossier., MD.   Contact information:   [address] [place]  [place] [address] [phone]          Signed: Isael Stille C. 12/24/2010, 8:47 AM

## 2010-12-29 ENCOUNTER — Ambulatory Visit (INDEPENDENT_AMBULATORY_CARE_PROVIDER_SITE_OTHER): Payer: Medicaid Other | Admitting: Family Medicine

## 2010-12-29 ENCOUNTER — Encounter: Payer: Self-pay | Admitting: Family Medicine

## 2010-12-29 VITALS — BP 127/72 | HR 80 | Temp 98.9°F | Ht 68.0 in | Wt 225.0 lb

## 2010-12-29 DIAGNOSIS — N949 Unspecified condition associated with female genital organs and menstrual cycle: Secondary | ICD-10-CM

## 2010-12-29 DIAGNOSIS — R102 Pelvic and perineal pain: Secondary | ICD-10-CM

## 2010-12-29 DIAGNOSIS — B9689 Other specified bacterial agents as the cause of diseases classified elsewhere: Secondary | ICD-10-CM | POA: Insufficient documentation

## 2010-12-29 DIAGNOSIS — N760 Acute vaginitis: Secondary | ICD-10-CM | POA: Insufficient documentation

## 2010-12-29 DIAGNOSIS — A499 Bacterial infection, unspecified: Secondary | ICD-10-CM

## 2010-12-29 LAB — CBC WITH DIFFERENTIAL/PLATELET
HCT: 38.2 % (ref 36.0–46.0)
Hemoglobin: 13.3 g/dL (ref 12.0–15.0)
Lymphocytes Relative: 44 % (ref 12–46)
MCHC: 34.8 g/dL (ref 30.0–36.0)
Monocytes Absolute: 0.3 10*3/uL (ref 0.1–1.0)
Monocytes Relative: 7 % (ref 3–12)
Neutro Abs: 1.8 10*3/uL (ref 1.7–7.7)
WBC: 4 10*3/uL (ref 4.0–10.5)

## 2010-12-29 LAB — POCT WET PREP (WET MOUNT)
Trichomonas Wet Prep HPF POC: NEGATIVE
WBC, Wet Prep HPF POC: <5
Yeast Wet Prep HPF POC: NEGATIVE

## 2010-12-29 MED ORDER — METRONIDAZOLE 500 MG PO TABS: 500.0000 mg | ORAL_TABLET | Freq: Two times a day (BID) | ORAL | Status: AC

## 2010-12-29 MED ORDER — DOXYCYCLINE HYCLATE 100 MG PO CAPS: 100.0000 mg | ORAL_CAPSULE | Freq: Two times a day (BID) | ORAL | Status: AC

## 2010-12-29 MED ORDER — KETOROLAC TROMETHAMINE 60 MG/2ML IM SOLN
60.0000 mg | Freq: Once | INTRAMUSCULAR | Status: AC
  Administered 2010-12-29: 60 mg via INTRAMUSCULAR

## 2010-12-29 NOTE — Patient Instructions (Signed)
Will treat you for a possible infection in your pelvis with antibiotic called docycycline  Will give you toradol for pain today, continue to take your home pain medications  Schedule a follow-up with your PCP for refills on pain medication  If you have worsening pain, fever, or other concerns from your surgery, please contact your OB GYN office.

## 2010-12-29 NOTE — Progress Notes (Signed)
  Subjective:    Patient ID: [name], female    DOB: 1965/03/30, 45 y.o.   MRN: [id number]  HPI 45 yo here for work in appointment for "pain all over"  Tearful in office.  States she has an endometrial ablation approx 1 week ago for DUB.  States that on day of discharge had severe pain but they continued to discharge her.  Since then, she has had severe pelvic pain and also reports pain in arms, legs, back, and tongue. States vaginal bleeding had stopped several days ago, but then she has started having some dark brown spotting.  No vaginal discharge.  Takes multiple pain medications for chronic pain. States none of them are helping.  Review of Systems  Constitutional: Positive for fever, chills and fatigue.  HENT: Positive for facial swelling, neck pain and neck stiffness.   Respiratory: Positive for cough. Negative for shortness of breath.   Cardiovascular: Positive for leg swelling. Negative for chest pain.  Gastrointestinal: Positive for nausea, abdominal pain and diarrhea.  Genitourinary: Positive for vaginal bleeding, vaginal discharge, vaginal pain and pelvic pain. Negative for dysuria, urgency, frequency and hematuria.  Musculoskeletal: Positive for myalgias and back pain.  Skin: Negative for rash.  Neurological: Positive for headaches. Negative for numbness.       Objective:   Physical Exam GEN: Alert & Oriented, tearful CV:  Regular Rate & Rhythm, no murmur Respiratory:  Normal work of breathing, CTAB Abd:  + BS, soft, no tenderness to palpation Ext: no pre-tibial edema MSK:  Tender over muscles of arms, legs, throughout palpation of entire back.No joint swelling. Pelvic Exam:        External: normal female genitalia without lesions or masses        Vagina: normal without lesions or masses.  Scant serous discharge        Cervix: normal without lesions or masses.  Tearful throughout exam.        Samples for Wet prep, GC/Chlamydia obtained       Assessment &  Plan:

## 2010-12-29 NOTE — Assessment & Plan Note (Addendum)
Patient with postoperative myalgias, pelvic pain and subjective reports of fever.  Will culture GC/Chalmydia, Wet prep, CBC/diff and treat empirically with doxycycline for endometritis.    I have encouraged her to follow-up with her gynecologist if she continues to have postoperative pain.  I have also encouraged her to follow-up with her PCP regarding her chronic pain medication prescriptions.  Was given Toradol x 1 in the office today.

## 2010-12-30 ENCOUNTER — Other Ambulatory Visit: Payer: Self-pay | Admitting: Family Medicine

## 2010-12-30 ENCOUNTER — Encounter: Payer: Self-pay | Admitting: Family Medicine

## 2010-12-30 NOTE — Telephone Encounter (Signed)
[name], please inform patient that I will have the prescription ready for pick-up by Friday afternoon. She was informed in early August that she would need an appointment in 1 month so that her medications would not run out (she was given 2 months' worth of prescriptions at that time). And she will need to follow-up with me in 1 month if she would like any more oxycodone. I will no longer be re-filling prescription requests for this medication over the phone if she neglects to follow-up appropriately.  Thank you [name].

## 2010-12-30 NOTE — Telephone Encounter (Signed)
Please remind [name] that we discussed yesterday that she must get all refills for scheduled 2 medications from her primary care physician

## 2010-12-30 NOTE — Telephone Encounter (Signed)
Called and explained to pt that her pcp will have to refill these medications for her. She stated that she understood that and wanted to know if she could have someone to pick this up today for her. I told her that I would forward this information to Dr. Madolyn Frieze for her review and could not  if she will hear something back about this today pt understood and agreed.Loralee Pacas [place]

## 2010-12-30 NOTE — Telephone Encounter (Signed)
[name] was in yesterday and saw Dr. Earnest Bailey but forgot to ask for some pain medication.  She normally takes Oxycontin.  Please give her a call when it is ready.  She is also needing a refill on her Ativan.

## 2010-12-30 NOTE — Telephone Encounter (Signed)
Called and informed pt that Rx will be ready on Friday afternoon for her to p/u and that she will need to make an appt 1 month prior to have her prescriptions refilled. Told her that Dr. Madolyn Frieze would like for her to f/u with her in one month and she will no longer refill this over the phone.Laureen Ochs, Viann Shove

## 2011-01-01 ENCOUNTER — Other Ambulatory Visit: Payer: Self-pay | Admitting: Family Medicine

## 2011-01-01 DIAGNOSIS — M5126 Other intervertebral disc displacement, lumbar region: Secondary | ICD-10-CM

## 2011-01-01 NOTE — Telephone Encounter (Signed)
[name] is calling to see if her pain medication Rx was up front to pick up.  It was not there but I let her know that Dr. Madolyn Frieze will be here this afternoon, so perhaps she will take care of it this afternoon.  She will call back later.  When it is ready, she would appreciate a call.

## 2011-01-03 MED ORDER — OXYCODONE HCL 5 MG PO TABS: ORAL_TABLET | ORAL | Status: DC

## 2011-01-03 NOTE — Telephone Encounter (Signed)
Please notify patient prescriptions for 3 months are ready for pick-up.  She will need an appointment in January, before she runs out of medication.  I will not re-fill this medication without an appointment after these prescriptions.  Thank you [name].

## 2011-01-12 ENCOUNTER — Ambulatory Visit: Payer: Medicaid Other | Admitting: Family Medicine

## 2011-01-22 ENCOUNTER — Encounter: Payer: Self-pay | Admitting: Family Medicine

## 2011-01-22 ENCOUNTER — Ambulatory Visit (INDEPENDENT_AMBULATORY_CARE_PROVIDER_SITE_OTHER): Payer: Medicaid Other | Admitting: Family Medicine

## 2011-01-22 DIAGNOSIS — E669 Obesity, unspecified: Secondary | ICD-10-CM

## 2011-01-22 DIAGNOSIS — E039 Hypothyroidism, unspecified: Secondary | ICD-10-CM

## 2011-01-22 DIAGNOSIS — Z8742 Personal history of other diseases of the female genital tract: Secondary | ICD-10-CM

## 2011-01-22 DIAGNOSIS — Z0000 Encounter for general adult medical examination without abnormal findings: Secondary | ICD-10-CM | POA: Insufficient documentation

## 2011-01-22 DIAGNOSIS — F341 Dysthymic disorder: Secondary | ICD-10-CM

## 2011-01-22 DIAGNOSIS — Z23 Encounter for immunization: Secondary | ICD-10-CM

## 2011-01-22 DIAGNOSIS — F411 Generalized anxiety disorder: Secondary | ICD-10-CM

## 2011-01-22 DIAGNOSIS — M5126 Other intervertebral disc displacement, lumbar region: Secondary | ICD-10-CM

## 2011-01-22 DIAGNOSIS — F419 Anxiety disorder, unspecified: Secondary | ICD-10-CM

## 2011-01-22 DIAGNOSIS — F17200 Nicotine dependence, unspecified, uncomplicated: Secondary | ICD-10-CM

## 2011-01-22 MED ORDER — DULOXETINE HCL 60 MG PO CPEP: 60.0000 mg | ORAL_CAPSULE | Freq: Every day | ORAL | Status: DC

## 2011-01-22 MED ORDER — HYDROCODONE-ACETAMINOPHEN 5-325 MG PO TABS: 1.0000 | ORAL_TABLET | Freq: Two times a day (BID) | ORAL | Status: DC | PRN

## 2011-01-22 MED ORDER — LEVOTHYROXINE SODIUM 175 MCG PO TABS: 175.0000 ug | ORAL_TABLET | Freq: Every day | ORAL | Status: DC

## 2011-01-22 MED ORDER — LORAZEPAM 2 MG PO TABS: 2.0000 mg | ORAL_TABLET | Freq: Every day | ORAL | Status: DC | PRN

## 2011-01-22 NOTE — Assessment & Plan Note (Signed)
Improved. Good mood today. Patient presents with mood swings. Compliant with Celexa now.  Will continue Ativan daily prn.  Follow-up in 3 months.

## 2011-01-22 NOTE — Progress Notes (Signed)
  Subjective:    Patient ID: [name], female    DOB: June 06, 1965, 45 y.o.   MRN: [id number]  HPI  1. S/p endometrial ablation in October 2012 Denies fevers, chills, vaginal discharge. Scant blood. About time for her period (middle of month).   2. Obesity Interested in trying fen-phen  3. Tobacco Quit smoking about 1 month ago! Now gaining weight though  4. Chronic back pain Persistent, unchanged Taking 2 oxycodone daily Making her nauseated Asking for hydrocodone instead  Review of Systems Per HPI    Objective:   Physical Exam Gen: NAD CV: RRR Pulm: CTAB Ext: unchanged edema bilateral extremities Back: unchanged right sacroiliac pain; moderate TTP; able to flex spine about 60 degrees; no muscle spasms with ROM of spine    Assessment & Plan:

## 2011-01-22 NOTE — Assessment & Plan Note (Signed)
Worsened due to recent tobacco cessation.  Patient wants to try fen-phen. Discouraged this due to side effects and ineffective long-term weight loss after discontinuing medication. Recommended diet and exercise.

## 2011-01-22 NOTE — Assessment & Plan Note (Signed)
Persistent unchanged right sacroiliac pain.  Requesting to be changed from oxycodone to hydrocodone. Patient gave me December's prescription. Given Rx for Norco x 3 months.

## 2011-01-22 NOTE — Assessment & Plan Note (Signed)
Quit! About a month ago. Now with weight gain however. But commended her on tobacco cessation and discussed weight loss options.

## 2011-01-22 NOTE — Patient Instructions (Addendum)
Make an appointment within 3 months for refills.  I am glad to see you doing well.   Try the Norco instead of the oxycodone or the percocet for your pain.

## 2011-02-02 ENCOUNTER — Telehealth: Payer: Self-pay | Admitting: Family Medicine

## 2011-02-02 MED ORDER — OXYCODONE HCL 5 MG PO TABS: 5.0000 mg | ORAL_TABLET | ORAL | Status: DC | PRN

## 2011-02-02 MED ORDER — OXYCODONE HCL 5 MG PO TABS: 5.0000 mg | ORAL_TABLET | Freq: Two times a day (BID) | ORAL | Status: DC | PRN

## 2011-02-02 NOTE — Telephone Encounter (Signed)
Vicodin is making her itch and is making her lymphedemia act up.  She is hoping to go back to the Oxycodone and if so, she will need a new Rx that she would like to pick up today.  She would like Dr. Madolyn Frieze to call her.

## 2011-02-02 NOTE — Telephone Encounter (Signed)
Please inform patient that I will drop off prescriptions that she may pick up on Wednesday afternoon after 1:30 pm when I will be able to get to clinic.

## 2011-03-09 DIAGNOSIS — K635 Polyp of colon: Secondary | ICD-10-CM

## 2011-03-09 HISTORY — PX: COLONOSCOPY: SHX174

## 2011-03-09 HISTORY — PX: ESOPHAGOGASTRODUODENOSCOPY: SHX1529

## 2011-03-09 HISTORY — DX: Polyp of colon: K63.5

## 2011-05-03 ENCOUNTER — Encounter: Payer: Self-pay | Admitting: Family Medicine

## 2011-05-03 ENCOUNTER — Ambulatory Visit (INDEPENDENT_AMBULATORY_CARE_PROVIDER_SITE_OTHER): Payer: Medicaid Other | Admitting: Family Medicine

## 2011-05-03 DIAGNOSIS — E039 Hypothyroidism, unspecified: Secondary | ICD-10-CM

## 2011-05-03 DIAGNOSIS — R7989 Other specified abnormal findings of blood chemistry: Secondary | ICD-10-CM

## 2011-05-03 DIAGNOSIS — R059 Cough, unspecified: Secondary | ICD-10-CM

## 2011-05-03 DIAGNOSIS — J449 Chronic obstructive pulmonary disease, unspecified: Secondary | ICD-10-CM

## 2011-05-03 DIAGNOSIS — R05 Cough: Secondary | ICD-10-CM

## 2011-05-03 DIAGNOSIS — E785 Hyperlipidemia, unspecified: Secondary | ICD-10-CM

## 2011-05-03 DIAGNOSIS — J4489 Other specified chronic obstructive pulmonary disease: Secondary | ICD-10-CM

## 2011-05-03 LAB — LIPID PANEL
Cholesterol: 229 mg/dL — ABNORMAL HIGH (ref 0–200)
Total CHOL/HDL Ratio: 2.7 Ratio
VLDL: 23 mg/dL (ref 0–40)

## 2011-05-03 LAB — CBC
Hemoglobin: 13 g/dL (ref 12.0–15.0)
MCH: 34.2 pg — ABNORMAL HIGH (ref 26.0–34.0)
MCV: 101.3 fL — ABNORMAL HIGH (ref 78.0–100.0)
RBC: 3.8 MIL/uL — ABNORMAL LOW (ref 3.87–5.11)

## 2011-05-03 LAB — POCT GLYCOSYLATED HEMOGLOBIN (HGB A1C): Hemoglobin A1C: 5

## 2011-05-03 MED ORDER — FLUTICASONE PROPIONATE 50 MCG/ACT NA SUSP: 2.0000 | Freq: Every day | NASAL | Status: DC

## 2011-05-03 MED ORDER — LEVOTHYROXINE SODIUM 175 MCG PO TABS: 175.0000 ug | ORAL_TABLET | Freq: Every day | ORAL | Status: DC

## 2011-05-03 MED ORDER — ALBUTEROL SULFATE HFA 108 (90 BASE) MCG/ACT IN AERS: 2.0000 | INHALATION_SPRAY | RESPIRATORY_TRACT | Status: DC | PRN

## 2011-05-03 MED ORDER — FEXOFENADINE HCL 180 MG PO TABS: 180.0000 mg | ORAL_TABLET | Freq: Every day | ORAL | Status: DC

## 2011-05-03 NOTE — Patient Instructions (Signed)
For your cough: -try the nasal spray and anti-histamine -Follow-up in 2 weeks  For the wart: -make an appointment with me to get it removed  For pain and poor circulation: -make an appointment to discuss this further with me.  If your lab results are normal, I will send you a letter with the results. If abnormal, someone at the clinic will get in touch with you.   It was nice to see you today.

## 2011-05-03 NOTE — Assessment & Plan Note (Signed)
Chronic cough. May be allergies? Reactive airway? Does not seem COPD exacerbation as unalleviated by albuterol, chronicity, and absence of wheezing. Will treat with anti-histamines oral and steroid nasal spray for now.  Follow-up in 2 weeks.

## 2011-05-03 NOTE — Progress Notes (Signed)
Addended by: [place], Miles Borkowski on: 05/03/2011 04:01 PM   Modules accepted: Orders

## 2011-05-03 NOTE — Progress Notes (Signed)
  Subjective:    Patient ID: [name], female    DOB: 06-04-1965, 46 y.o.   MRN: [id number]  HPI Here for physical and with some acute complaints:  1. Right foot toe warts Hurting her and wants them removed Had to be in the hospital in the past after they were frozen off due to infection  2. Thyroid medication refilled  3. Cough Persistent. Occasional productive of dark green sputum Has been going on for years. Not alleviated by anything, including albuterol. Not worse at particular time of day. Not exacerbated by anything. Smokes maybe 1 time a month.  ROS: denies wheezing; uses albuterol about once a day, last used several hours ago this morning.  Review of Systems Per HPI with inclusion of following: Notices blood after she wipes No diarrhea/constipation    Objective:   Physical Exam Gen: NAD HEENT: no rhinorrhea actively but some discharge in nares CV: RRR no m/r/g Pulm: CTAB without w/r/r Abd: NABS, soft, NT Ext: lymphedema bilaterally significant but unchanged from baseline; 1+ pitting Foot: 1 cm diameter wart on 2nd right toe on dorsum and another shallow wart 1 cm on plantar 1st toe; moderate tenderness, no erythema, drainage, warmth     Assessment & Plan:  Right foot toe warts -Make appointment to remove  Poor circulation in legs and chronic pain from lymphedema -Make appointment to discuss. Will not refill percocet today.

## 2011-05-03 NOTE — Assessment & Plan Note (Signed)
Checking TSH today.

## 2011-05-04 ENCOUNTER — Encounter: Payer: Self-pay | Admitting: Family Medicine

## 2011-05-11 ENCOUNTER — Encounter: Payer: Self-pay | Admitting: Family Medicine

## 2011-05-11 ENCOUNTER — Ambulatory Visit (INDEPENDENT_AMBULATORY_CARE_PROVIDER_SITE_OTHER): Payer: Medicaid Other | Admitting: Family Medicine

## 2011-05-11 VITALS — BP 126/84 | HR 68 | Temp 97.7°F | Ht 68.0 in | Wt 230.9 lb

## 2011-05-11 DIAGNOSIS — B070 Plantar wart: Secondary | ICD-10-CM | POA: Insufficient documentation

## 2011-05-11 MED ORDER — SALICYLIC ACID 40 % EX PADS: 1.0000 | MEDICATED_PAD | Freq: Every day | CUTANEOUS | Status: DC

## 2011-05-11 NOTE — Progress Notes (Signed)
  Subjective:    Patient ID: [name], female    DOB: 06-Apr-1965, 46 y.o.   MRN: [id number]  HPI Patient would like wart removed on 2nd toe of right foot.   Review of Systems     Objective:   Physical Exam Right foot: 5 warts on 1-3rd toes. Large 0.5 cm filiform wart on dorsum 2nd toe. Other warts flatter.     Assessment & Plan:  Shaved off filiform wart.  Sanitized right 2nd toe with alcohol swabs, anesthestized with 3 mL lidocaine without epinephrine, shaved with 15-blade, cauterized with silver nitrate sticks, covered with gauze and tape.

## 2011-05-11 NOTE — Patient Instructions (Signed)
Try the Mediplast over your other warts. Cover the Mediplast and secure in place with duct tape.  May repeat for up to 12 weeks.   Follow-up with me next Friday to examine your foot if you are concerned.   You may wrap your foot in an ACE bandage at the pharmacy.  You may taken Tylenol 650 every 8 hours or ibuprofen 600 every 6 hours for pain.  Your toe may still bleed for the next 24-hours. Keep covered. If you notice any redness, swelling, have fevers or other signs of infection, please make an appointment to be seen at the clinic.

## 2011-05-21 ENCOUNTER — Ambulatory Visit: Payer: Medicaid Other | Admitting: Family Medicine

## 2011-05-26 ENCOUNTER — Encounter: Payer: Self-pay | Admitting: Family Medicine

## 2011-05-26 ENCOUNTER — Ambulatory Visit (INDEPENDENT_AMBULATORY_CARE_PROVIDER_SITE_OTHER): Payer: Medicaid Other | Admitting: Family Medicine

## 2011-05-26 VITALS — BP 126/83 | HR 66 | Temp 98.9°F | Ht 68.0 in | Wt 228.0 lb

## 2011-05-26 DIAGNOSIS — R141 Gas pain: Secondary | ICD-10-CM

## 2011-05-26 DIAGNOSIS — I890 Lymphedema, not elsewhere classified: Secondary | ICD-10-CM

## 2011-05-26 DIAGNOSIS — B351 Tinea unguium: Secondary | ICD-10-CM | POA: Insufficient documentation

## 2011-05-26 DIAGNOSIS — R140 Abdominal distension (gaseous): Secondary | ICD-10-CM

## 2011-05-26 DIAGNOSIS — R109 Unspecified abdominal pain: Secondary | ICD-10-CM | POA: Insufficient documentation

## 2011-05-26 MED ORDER — DICYCLOMINE HCL 20 MG PO TABS: 20.0000 mg | ORAL_TABLET | Freq: Four times a day (QID) | ORAL | Status: DC

## 2011-05-26 MED ORDER — TERBINAFINE HCL 250 MG PO TABS: 250.0000 mg | ORAL_TABLET | Freq: Every day | ORAL | Status: DC

## 2011-05-26 NOTE — Assessment & Plan Note (Addendum)
Presumed. Will treat with terbinafine x 12 weeks. Advised better foot hygiene: wearing socks with shoes, keeping feet clean and dry.

## 2011-05-26 NOTE — Assessment & Plan Note (Signed)
Suspect IBS at this time with mixed diarrhea and constipation picture. Start Bentyl.  Follow-up in 1 month.

## 2011-05-26 NOTE — Assessment & Plan Note (Addendum)
Worsening pain after I removed wart recently.  I had prescribed her percocet in the past which controlled the pain, but I do not think this is an appropriate medication for this condition. And it is difficult for me to believe that removing a wart can cause an exacerbation of her lymphedema pain. Patient does not appear to be in significant distress. I think stopping percocet have actually been beneficial for her (she has been on Vicodin for the past several months and has not needed to take them on a regular basis)  and she has had no flares besides the reported flare which she complains of today. I think a large part of her perception of pain is psychological. She is on Cymbalta.  Will check ABIs today to assess circulation. I wonder if Pletal would benefit her.  The patient reports she had seen specialists in the past for this problem when she was young, but they did not recommend anything but conservative management (not surgery) for this issue. Consider referring her to [facility] if pain is still problem.  Per patient request, I will also refer her to pain clinic to see if they have any other ideas regarding her pain management, notably chronic lymphedema pain. Also I wonder if complete decongestive therapy (done by PT) would help.  Update: ABI 06/01/2011 shows reduction in bilateral posterior tibial areas but this is thought artifact, secondary to patient's lymphedema. Patient has no risk factors for arterial disease except for moderate HLD (LDL in 120s) not on statin.

## 2011-05-26 NOTE — Patient Instructions (Signed)
I think you may have a fungal infection of your toes. Take the anti-fungal medication for 12 weeks. Make sure you wear socks with your shoes and keep your feet clean.  Let's check the circulation in your feet.  For your gas and constipation/diarrhea, let's try Bentyl to calm down your stomach. Follow-up in 1 month.

## 2011-05-26 NOTE — Progress Notes (Signed)
  Subjective:    Patient ID: [name], female    DOB: [birth year], 46 y.o.   MRN: [id number]  HPI 1. Leg pain, decreased sensation right foot, darkened toe nails Following removal of wart recently by me here at the clinic.  Her lymphedema pain has been exacerbated by my removing the wart as well. Vicodin makes her itch. Tramadol did not help pain in the past. NSAIDs do not help.   Reports decreased sensation in certain areas in certain toes following removal of wart. Denies pain in toes. Feels area of toe where wart removed healing well. Denies bleeding there.  Darkened toe nails for a while. Crumbly.   Patient stopped using salicylic pads for remaining warts on right foot due to stinging.   2. Flatulence With alternating constipation and diarrhea. Takes OTC laxative prn.  This has been a longstanding problem.  Review of Systems Denies nausea/vomiting. Denies blood in stool.  Denies urine or bladder incontinence. Denies changes in gait or weakness.     Objective:   Physical Exam Gen: NAD, obese CV: RRR Pulm: NI WOB Abd: NABS, soft, NT, ND, obese Legs: significant lymphedema unchanged from previously Feet: ?1+ pedal pulses bilaterally; patient wearing sneakers without socks and feet are damp   Right foot: 2nd toe: area of wart removal; black scab; non-erythematous, warm or TTP; sensation intact in all toes; warts present on a few toes    Assessment & Plan:

## 2011-05-31 ENCOUNTER — Telehealth: Payer: Self-pay | Admitting: Family Medicine

## 2011-05-31 ENCOUNTER — Ambulatory Visit (INDEPENDENT_AMBULATORY_CARE_PROVIDER_SITE_OTHER): Payer: Medicaid Other | Admitting: Family Medicine

## 2011-05-31 ENCOUNTER — Encounter: Payer: Self-pay | Admitting: Family Medicine

## 2011-05-31 VITALS — BP 118/80 | HR 52 | Temp 98.0°F | Ht 68.0 in | Wt 230.7 lb

## 2011-05-31 DIAGNOSIS — R140 Abdominal distension (gaseous): Secondary | ICD-10-CM

## 2011-05-31 DIAGNOSIS — R142 Eructation: Secondary | ICD-10-CM

## 2011-05-31 DIAGNOSIS — M5126 Other intervertebral disc displacement, lumbar region: Secondary | ICD-10-CM

## 2011-05-31 LAB — POCT UA - MICROSCOPIC ONLY

## 2011-05-31 LAB — POCT URINALYSIS DIPSTICK
Bilirubin, UA: NEGATIVE
Ketones, UA: NEGATIVE
Leukocytes, UA: NEGATIVE
Nitrite, UA: NEGATIVE
Protein, UA: NEGATIVE
pH, UA: 5.5

## 2011-05-31 NOTE — Patient Instructions (Signed)
We will refer you to a GI doctor, likely [place] who you have seen before.   We will refer you to a chiropractor.  Take 3 hemoccult cards. Bring back the cards to the clinic so we can test for blood.   We will check your urine today.  If your lab results are normal, I will send you a letter with the results. If abnormal, someone at the clinic will get in touch with you.   Follow-up in 1 month.

## 2011-05-31 NOTE — Progress Notes (Signed)
  Subjective:    Patient ID: [name], female    DOB: [birth year], 46 y.o.   MRN: [id number]  HPI Patient complaining of persistent abdominal cramping, flatulence, constipation/diarrhea alternating.  This has been going on for several years. She did see a GI specialist in the past (she thinks ). They diagnosed her with gastritis after an EGD. She does not know if they tested her for H. Pylori.  She complains of indigestion. She is not sure if her PPI helps her symptoms.   She denies worsening of her symptoms with certain foods including milk or wheat products.  She did mention one episode of bloody stool a month ago. She did have a period soon afterwards but is confident that the blood came from her rectum and not vagina.   She is also worried she may have a urine infection. She denies nausea/vomiting/fevers, however, is complaining of some dysuria and back pain.   Review of Systems Per HPI    Objective:   Physical Exam Gen: NAD Abd: NABS, soft, mild diffuse TTP without guarding or rebound, obese Ext: no rash; chronic lymphedema (congenital) Back: diffuse TTP over whole lumbar area without obvious CVA tenderness (patient has a history of chronic back pain)    Assessment & Plan:

## 2011-05-31 NOTE — Telephone Encounter (Signed)
Patient is calling because she wants a referral to have a Colonoscopy.  She also want the MD to know the medicine for her stomache is not helping.

## 2011-06-01 ENCOUNTER — Ambulatory Visit (HOSPITAL_COMMUNITY)
Admission: RE | Admit: 2011-06-01 | Discharge: 2011-06-01 | Disposition: A | Discharge status: Home / Self Care | Payer: Medicaid Other | Source: Ambulatory Visit | Attending: Family Medicine | Admitting: Family Medicine

## 2011-06-01 ENCOUNTER — Encounter: Payer: Self-pay | Admitting: Internal Medicine

## 2011-06-01 ENCOUNTER — Encounter: Payer: Self-pay | Admitting: Family Medicine

## 2011-06-01 DIAGNOSIS — R0989 Other specified symptoms and signs involving the circulatory and respiratory systems: Secondary | ICD-10-CM

## 2011-06-01 DIAGNOSIS — I890 Lymphedema, not elsewhere classified: Secondary | ICD-10-CM

## 2011-06-01 NOTE — Progress Notes (Signed)
*  PRELIMINARY RESULTS* Vascular Ultrasound Lower Extremity Arterial Doppler has been completed.    RIGHT    LEFT    PRESSURE WAVEFORM  PRESSURE WAVEFORM  BRACHIAL 125 Triphasic BRACHIAL 116 Triphasic  DP 135 Biphasic DP 136 Triphasic  AT   AT    PT 101 Monophasic PT 32 Monophasic  PER   PER    GREAT TOE  NA GREAT TOE  NA    RIGHT LEFT  ABI 1.08 1.09    ABIs at bilateral dorsalis pedis arteries are normal and ABIs at bilateral posterior tibial arteries are reduced. I feel this is falsely reduced due to extensive lymphedema in the patient's lower extremities. I believe the waveforms of the lower extremities are similarly falsely reduced due to lymphedema.   06/01/2011 Elpidio Galea RDMS, RDCS

## 2011-06-01 NOTE — Assessment & Plan Note (Addendum)
Chronic non-specific abdominal pain/cramping with flatulence, mixed constipation/diarrhea, and symptoms of indigestion.  She takes laxatives routinely to prevent constipation.  She had seen a GI specialist in the past and per patient, was diagnosed with gastritis after an EGD; she is not sure if they tested her for H. Pylori. Possible etiologies:   -Recurrent gastritis/GERD. No alleviation of symptoms with PPI. May consider increasing dose to see if it helps.    -IBS. No relief with Bentyl. May consider alternative medication for mixed constipation/diarrhea. I would say this is high on differential. She frequently brings in multiple complaints to each clinic visit; she appears anxious and is often asking for work-up of vague symptoms. But would need to rule-out other possibilities first.    -Inflammatory/malignancy. Must always be considered especially with purported history of recent GIB (although 1 episode, resolved spontaneously). Patient given hemoccult cards and asked to hemoccult stools x3.>>>3/3 positive for blood. GI referral pending. Will try to expedite referral.   -Allergic reaction to food. Although no change with certain foods. Consider asking patient to keep food diary.   Will refer back to GI to see if she warrants another endoscopy especially with her history of gastritis (per patient report) and patient requesting referral.  For discomfort (flatulence), recommend OTC simethicone for now. Patient had been on narcotics chronically (taking a couple of pills of Vicodin daily) for chronic pain for lymphedema. I have notified patient I will no longer prescribe narcotics for lymphedema pain. Referral to pain clinic pending.

## 2011-06-02 ENCOUNTER — Telehealth: Payer: Self-pay | Admitting: *Deleted

## 2011-06-02 ENCOUNTER — Telehealth: Payer: Self-pay | Admitting: Family Medicine

## 2011-06-02 DIAGNOSIS — I890 Lymphedema, not elsewhere classified: Secondary | ICD-10-CM

## 2011-06-02 DIAGNOSIS — M549 Dorsalgia, unspecified: Secondary | ICD-10-CM

## 2011-06-02 NOTE — Telephone Encounter (Signed)
Ms. Hakimi called back to say that she got the name of a Chiropractic office that will file her Medicaid.  She need a referral sent to them.  The name of the phy is [facility] at [address] [employer].  Phone number is [phone].

## 2011-06-02 NOTE — Telephone Encounter (Signed)
Called to let pt know that I was almost positive that medicaid was not going to pay for a chiropractor, but she was more than welcome to call around and see if she could find one.  Pt dose not like this plan but she is agreeable.  Also wants Dr. Madolyn Frieze to call her to let her know about her test from the hospital yesterday.  Will forward to MD to call. Rakeisha Nyce, Maryjo [place]

## 2011-06-03 NOTE — Assessment & Plan Note (Signed)
See telephone documentation 03/28. Refer to PT for complete decongestive therapy.

## 2011-06-03 NOTE — Telephone Encounter (Signed)
Shanda Bumps will you send referral to this place?  Thank you.

## 2011-06-03 NOTE — Telephone Encounter (Signed)
I informed the patient that I had sent a letter with the ABI results but we also discussed over the phone.   Also notified patient that pain clinic did not feel they could offer her anything more.  Patient reports having worsening lymphedema pain during her periods, which lasts for 2 weeks. She is requesting percocet for 14/30 days of the month bid. She reports not having percocet since December. She had taken Vicodin prn but it causes her itching and she does not like to take it. I told patient I did not feel percocet appropriate at this time.  Patient then says she will try Excedrin which has helped her in the past. Also discussed whether patient interested in complete decongestive therapy for lymphedema. Patient interested. Will refer to PT.

## 2011-06-07 NOTE — Telephone Encounter (Signed)
LMOVM for [facility] to call back so I may give referral. [name], [name]

## 2011-06-08 NOTE — Telephone Encounter (Signed)
Will get MRI of L-spine to evaluate herniated disc since patient reports pain is getting worse.  Will call patient with results if anything significant, otherwise will discuss results on 04/24 appointment if no change.  Also, patient will need an appointment for any more referrals. I will not make any more referrals or testing over the phone if not directly related to problem I have recently seen patient for.

## 2011-06-08 NOTE — Telephone Encounter (Signed)
Addended by: Madolyn Frieze, Marylene Land J on: 06/08/2011 02:21 PM   Modules accepted: Orders

## 2011-06-08 NOTE — Telephone Encounter (Signed)
Contacted [facility], they do not file medicaid.  Informed patient, she would like Dr. Madolyn Frieze to order an xray. [name], [name]

## 2011-06-09 NOTE — Telephone Encounter (Signed)
MRI scheduled for 06/11/11 @ 2:45 with [facility].  Misty Stanley informed of appointment.  Also advised Ileta we have sent over referral for Physical Therapy and they usually call her with an appointment.  Phone number for [facility] given to Kaliann to call to expedite her PT referral.  Ileana Ladd

## 2011-06-10 LAB — HEMOCCULT GUIAC POC 1CARD (OFFICE)
Card #2 Fecal Occult Blod, POC: POSITIVE
Card #3 Fecal Occult Blood, POC: POSITIVE

## 2011-06-10 NOTE — Progress Notes (Signed)
Addended by: [place], Morgaine Kimball on: 06/10/2011 11:29 AM   Modules accepted: Orders

## 2011-06-11 ENCOUNTER — Other Ambulatory Visit (HOSPITAL_COMMUNITY): Payer: Medicaid Other

## 2011-06-14 ENCOUNTER — Other Ambulatory Visit (HOSPITAL_COMMUNITY): Payer: Medicaid Other

## 2011-06-16 ENCOUNTER — Ambulatory Visit (HOSPITAL_COMMUNITY)
Admission: RE | Admit: 2011-06-16 | Discharge: 2011-06-16 | Disposition: A | Discharge status: Home / Self Care | Payer: Medicaid Other | Source: Ambulatory Visit | Attending: Family Medicine | Admitting: Family Medicine

## 2011-06-16 DIAGNOSIS — M545 Low back pain, unspecified: Secondary | ICD-10-CM | POA: Insufficient documentation

## 2011-06-16 DIAGNOSIS — M5126 Other intervertebral disc displacement, lumbar region: Secondary | ICD-10-CM | POA: Insufficient documentation

## 2011-06-16 DIAGNOSIS — M5380 Other specified dorsopathies, site unspecified: Secondary | ICD-10-CM | POA: Insufficient documentation

## 2011-06-16 DIAGNOSIS — M549 Dorsalgia, unspecified: Secondary | ICD-10-CM

## 2011-06-16 DIAGNOSIS — M5137 Other intervertebral disc degeneration, lumbosacral region: Secondary | ICD-10-CM | POA: Insufficient documentation

## 2011-06-16 DIAGNOSIS — M51379 Other intervertebral disc degeneration, lumbosacral region without mention of lumbar back pain or lower extremity pain: Secondary | ICD-10-CM | POA: Insufficient documentation

## 2011-06-16 DIAGNOSIS — M25559 Pain in unspecified hip: Secondary | ICD-10-CM | POA: Insufficient documentation

## 2011-06-21 ENCOUNTER — Telehealth: Payer: Self-pay | Admitting: Family Medicine

## 2011-06-21 NOTE — Telephone Encounter (Signed)
Please call patient back regarding results of MRI to spine

## 2011-06-22 ENCOUNTER — Encounter: Payer: Self-pay | Admitting: Family Medicine

## 2011-06-22 NOTE — Telephone Encounter (Signed)
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  Discussed management options. I would not recommend surgery in her at this time. However, I think weight loss would help her chronic pain. Patient agrees. Will refer to Health Coach Arlys John. Patient has appointment with me 04/24. Will try to make joint appointment at that time.

## 2011-06-23 ENCOUNTER — Encounter: Payer: Self-pay | Admitting: Internal Medicine

## 2011-06-24 ENCOUNTER — Ambulatory Visit (INDEPENDENT_AMBULATORY_CARE_PROVIDER_SITE_OTHER): Payer: Medicaid Other | Admitting: Internal Medicine

## 2011-06-24 ENCOUNTER — Telehealth: Payer: Self-pay | Admitting: Family Medicine

## 2011-06-24 ENCOUNTER — Encounter: Payer: Self-pay | Admitting: Internal Medicine

## 2011-06-24 VITALS — BP 100/78 | HR 78 | Ht 68.0 in | Wt 232.2 lb

## 2011-06-24 DIAGNOSIS — K219 Gastro-esophageal reflux disease without esophagitis: Secondary | ICD-10-CM

## 2011-06-24 DIAGNOSIS — R195 Other fecal abnormalities: Secondary | ICD-10-CM

## 2011-06-24 DIAGNOSIS — K3189 Other diseases of stomach and duodenum: Secondary | ICD-10-CM

## 2011-06-24 DIAGNOSIS — R1013 Epigastric pain: Secondary | ICD-10-CM | POA: Insufficient documentation

## 2011-06-24 DIAGNOSIS — R1310 Dysphagia, unspecified: Secondary | ICD-10-CM

## 2011-06-24 DIAGNOSIS — Z1211 Encounter for screening for malignant neoplasm of colon: Secondary | ICD-10-CM

## 2011-06-24 DIAGNOSIS — R112 Nausea with vomiting, unspecified: Secondary | ICD-10-CM

## 2011-06-24 MED ORDER — DEXLANSOPRAZOLE 60 MG PO CPDR: 60.0000 mg | DELAYED_RELEASE_CAPSULE | Freq: Every day | ORAL | Status: DC

## 2011-06-24 MED ORDER — PEG-KCL-NACL-NASULF-NA ASC-C 100 G PO SOLR: 1.0000 | Freq: Once | ORAL | Status: DC

## 2011-06-24 MED ORDER — ALIGN PO CAPS: 1.0000 | ORAL_CAPSULE | Freq: Every day | ORAL | Status: AC

## 2011-06-24 NOTE — Telephone Encounter (Signed)
Thank you Jess for calling.  GI should call and inform patient of results of colonoscopy.

## 2011-06-24 NOTE — Telephone Encounter (Signed)
Pt have an appt on 4/24 with GI doc.  Want to have results called to her after receiving.  Also needed to know the results of stool test she had taken.  Was told there was blood found, but never was informed of this.  Cancelled appt with you for same day of procedure.

## 2011-06-24 NOTE — Telephone Encounter (Signed)
Please notify patient of positive hemoccult results.

## 2011-06-24 NOTE — Progress Notes (Signed)
Subjective:    Patient ID: [name], female    DOB: March 04, 1966, 46 y.o.   MRN: [id number]  HPI Ms. Capetillo is a 46 yo female with PMH of hypothyroidism, hyperlipidemia, anxiety and depression, chronic lymphedema, mild COPD, history of irregular menstrual bleeding, who seen in consultation at the request of Dr. Madolyn Frieze for evaluation of epigastric pain, nausea, blood in stool and positive FOBT.  The patient reports long-standing epigastric pain. This is been going on for over 2 years, but has been worse over the last several months. She reports this is worse with eating. It also seems to be worse when she is constipated. She has noted some abdominal bloating and belching. She reports some nausea without vomiting. She is having heartburn on most days. She occasionally reports solid food dysphagia but no liquid food dysphagia. No odynophagia. She has been using Alka-Seltzer which helps but only transiently. She was given a prescription for dicyclomine but this has not been helpful. Regarding her bowel movements she reports these range from diarrhea to constipation. She feels constipation is more predominant for her. She occasionally sees red blood per rectum, And on one occasion in the last several months she's passed a red clot in her stool.  She denies melena. Her weight has been stable. Appetite is "okay". No early satiety. She did have a FOBT performed by her primary care which was +3 out of 3.  No fevers, or chills.  Review of Systems As per history of present illness, otherwise negative   Patient Active Problem List  Diagnoses  . HYPOTHYROIDISM, UNSPECIFIED  . HYPERLIPIDEMIA, BORDERLINE  . Anxiety and depression  . History of tobacco use  . LYMPHEDEMA  . COPD, MILD  . Lumbar disc herniation  . History of irregular menstrual bleeding  . Pelvic pain in female  . Preventative health care  . Obesity  . Plantar wart of right foot  . Onychomycosis  . Abdominal pain   Past Surgical  History  Procedure Date  . Thyroid surgery 2004    removed  . Cyst removed     behind left knee and left cheek  . Norplant removal     right upper arm - 12/2001  . Tubal ligation     2003  . Incision and drainage abscess / hematoma of bursa / knee / thigh     right upper thigh - 2008  . Eye surgery     bilarteral  . Svd     x 2  . Wisdom tooth extraction   . Endometrial ablation      Current Outpatient Prescriptions  Medication Sig Dispense Refill  . albuterol (VENTOLIN HFA) 108 (90 BASE) MCG/ACT inhaler Inhale 2 puffs into the lungs every 4 (four) hours as needed for wheezing or shortness of breath.  1 Inhaler  6  . dicyclomine (BENTYL) 10 MG capsule Take 10 mg by mouth. Take 2 tablets once a day      . DULoxetine (CYMBALTA) 60 MG capsule Take 1 capsule (60 mg total) by mouth daily.  30 capsule  2  . fluticasone (FLONASE) 50 MCG/ACT nasal spray Place 2 sprays into the nose daily.  16 g  0  . levothyroxine (SYNTHROID, LEVOTHROID) 175 MCG tablet Take 1 tablet (175 mcg total) by mouth daily.  30 tablet  6  . LORazepam (ATIVAN) 2 MG tablet Take 1 tablet (2 mg total) by mouth daily as needed for anxiety.  30 tablet  2  . Oxycodone-Acetaminophen (PERCOCET  PO) Take 1 tablet by mouth every 4 (four) hours as needed.      . triamcinolone (KENALOG) 0.5 % cream Apply topically 2 (two) times daily.  300 g  3  . bifidobacterium infantis (ALIGN) capsule Take 1 capsule by mouth daily.  30 capsule  0  . dexlansoprazole (DEXILANT) 60 MG capsule Take 1 capsule (60 mg total) by mouth daily.  30 capsule  3  . peg 3350 powder (MOVIPREP) SOLR Take 1 kit (100 g total) by mouth once.  1 kit  0   Allergies  Allergen Reactions  . Vicodin (Hydrocodone-Acetaminophen)     Itch   Family History  Problem Relation Age of Onset  . Diabetes Mother   . Hypertension Mother   . Diabetes Father   . Heart disease Father   . Diabetes Paternal Grandmother    History  Substance Use Topics  . Smoking status:  Current Everyday Smoker -- 0.2 packs/day for 25 years    Types: Cigarettes  . Smokeless tobacco: Never Used  . Alcohol Use: Yes     occasionally beer       Objective:   Physical Exam BP 100/78  Pulse 78  Ht 5\' 8"  (1.727 m)  Wt 232 lb 3.2 oz (105.325 kg)  BMI 35.31 kg/m2  LMP 06/16/2011 Constitutional: Well-developed and well-nourished. No distress. HEENT: Normocephalic and atraumatic. Oropharynx is clear and moist. No oropharyngeal exudate. Conjunctivae are normal. Pupils are equal round and reactive to light. No scleral icterus. Neck: Neck supple. Trachea midline. Cardiovascular: Normal rate, regular rhythm and intact distal pulses. No M/R/G Pulmonary/chest: Effort normal and breath sounds normal. No wheezing, rales or rhonchi. Abdominal: Soft, epigastric tenderness without rebound or guarding, nondistended. Bowel sounds active throughout. There are no masses palpable. No hepatosplenomegaly. Extremities: no clubbing, cyanosis, bilateral lymphedema of the lower extremities Lymphadenopathy: No cervical adenopathy noted. Neurological: Alert and oriented to person place and time. Skin: Skin is warm and dry. No rashes noted. Psychiatric: Normal mood and affect. Behavior is normal.  CBC    Component Value Date/Time   WBC 4.4 05/03/2011 1519   RBC 3.80* 05/03/2011 1519   HGB 13.0 05/03/2011 1519   HCT 38.5 05/03/2011 1519   PLT 291 05/03/2011 1519   MCV 101.3* 05/03/2011 1519   MCH 34.2* 05/03/2011 1519   MCHC 33.8 05/03/2011 1519   RDW 12.4 05/03/2011 1519   LYMPHSABS 1.8 12/29/2010 1132   MONOABS 0.3 12/29/2010 1132   EOSABS 0.1 12/29/2010 1132   BASOSABS 0.0 12/29/2010 1132   CMP     Component Value Date/Time   NA 131* 02/14/2010 1030   K 4.1 02/14/2010 1030   CL 102 02/14/2010 1030   CO2 23 02/14/2010 1030   GLUCOSE 85 02/14/2010 1030   BUN 13 02/14/2010 1030   CREATININE 0.92 02/14/2010 1030   CALCIUM 8.5 02/14/2010 1030   PROT 5.5* 02/11/2010 0500   ALBUMIN 3.0*  02/11/2010 0500   AST 24 02/11/2010 0500   ALT 12 02/11/2010 0500   ALKPHOS 50 02/11/2010 0500   BILITOT 1.1 02/11/2010 0500   GFRNONAA >60 02/14/2010 1030   GFRAA  Value: >60        The eGFR has been calculated using the MDRD equation. This calculation has not been validated in all clinical situations. eGFR's persistently <60 mL/min signify possible Chronic Kidney Disease. 02/14/2010 1030       Assessment & Plan:  Ms. Aughenbaugh is a 46 yo female with PMH of hypothyroidism, hyperlipidemia, anxiety and  depression, chronic lymphedema, mild COPD, history of irregular menstrual bleeding, who seen in consultation at the request of Dr. Madolyn Frieze for evaluation of epigastric pain, nausea, blood in stool and positive FOBT.   1. Epigastric pain/GERD -- patient does have symptoms consistent with GERD and I think she would benefit from a trial of PPI. Given her long-standing epigastric pain and reported history of gastritis (she had an EGD but I do not have this report), I recommended upper endoscopy.  We discussed the risk and benefits of this test and she is agreeable to proceed.  I will give her a trial Dexilant 60 mg daily. She is advised to take this 30 minutes to one hour before her first meal of the day.  2.  Alternating diarrhea/constipation/+FOBT -- her symptoms are suspicious for irritable bowel syndrome with alternating diarrhea and constipation along with abdominal bloating and some of lower abdominal pain. However, given her history of rectal bleeding and positive FOBT, both of which would not be consistent with IBS, I recommended colonoscopy. This can be done on the same day as her EGD, again the risk and benefits were discussed and she is agreeable to proceed. I recommended a trial of Align as a probiotic hopefully help regulate her digestion and bowel movements. We can discuss this further after her procedures.  Follow will be after procedures

## 2011-06-24 NOTE — Telephone Encounter (Signed)
Called pt, apologized that she had not been informed about the results.  Went over results, informing her that the results meant that there was blood in her stool but there could be a number of reasons and that was why we were sending her to the GI.  Pt ask that I let Dr. Madolyn Frieze know that she would like a call from her about the colonoscopy results (I feel that it is mostly to calm her as she sounds very anxious on the phone.)

## 2011-06-24 NOTE — Patient Instructions (Addendum)
You have been scheduled for a colonoscopy/Endoscopy with propofol. Please follow written instructions given to you at your visit today.  Please pick up your prep kit at the pharmacy within the next 1-3 days.  We have sent the following medications to your pharmacy for you to pick up at your convenience: movi prep; you received instructions today at your visit.  Dexilant: take 1 capsule daily 30 min. before your first meal of the day.  You have been given Align samples today.

## 2011-06-24 NOTE — Telephone Encounter (Signed)
Left message for the patient asking if interested in Health Coaching.

## 2011-06-29 ENCOUNTER — Telehealth: Payer: Self-pay | Admitting: Internal Medicine

## 2011-06-29 ENCOUNTER — Encounter: Payer: Self-pay | Admitting: *Deleted

## 2011-06-29 NOTE — Telephone Encounter (Signed)
.    This was opened in error.

## 2011-06-29 NOTE — Telephone Encounter (Signed)
Called pt, who states that, "My medicine that I take makes me nauseated when I take it without food.  Is there anything that I can eat?"  Writer emphasized the need to have clear liquids only and why- for her colon to be as cleaned out as possible.  Writer states, "I have to have something. I am going to have a little ice cream."  Writer states that if she does do that, drink her Moviprep as directed- the entire prep and to push her fluids.  Understanding voiced.

## 2011-06-30 ENCOUNTER — Ambulatory Visit (AMBULATORY_SURGERY_CENTER): Payer: Medicaid Other | Admitting: Internal Medicine

## 2011-06-30 ENCOUNTER — Ambulatory Visit: Payer: Medicaid Other | Admitting: Family Medicine

## 2011-06-30 ENCOUNTER — Encounter: Payer: Self-pay | Admitting: Internal Medicine

## 2011-06-30 VITALS — BP 124/62 | HR 84 | Temp 99.6°F | Resp 16 | Ht 68.0 in | Wt 232.0 lb

## 2011-06-30 DIAGNOSIS — Z1211 Encounter for screening for malignant neoplasm of colon: Secondary | ICD-10-CM

## 2011-06-30 DIAGNOSIS — D126 Benign neoplasm of colon, unspecified: Secondary | ICD-10-CM

## 2011-06-30 DIAGNOSIS — K2970 Gastritis, unspecified, without bleeding: Secondary | ICD-10-CM

## 2011-06-30 DIAGNOSIS — R1013 Epigastric pain: Secondary | ICD-10-CM

## 2011-06-30 DIAGNOSIS — K2990 Gastroduodenitis, unspecified, without bleeding: Secondary | ICD-10-CM

## 2011-06-30 DIAGNOSIS — K219 Gastro-esophageal reflux disease without esophagitis: Secondary | ICD-10-CM

## 2011-06-30 DIAGNOSIS — R1310 Dysphagia, unspecified: Secondary | ICD-10-CM

## 2011-06-30 DIAGNOSIS — K635 Polyp of colon: Secondary | ICD-10-CM

## 2011-06-30 DIAGNOSIS — R195 Other fecal abnormalities: Secondary | ICD-10-CM

## 2011-06-30 MED ORDER — SODIUM CHLORIDE 0.9 % IV SOLN: 500.0000 mL | INTRAVENOUS | Status: DC

## 2011-06-30 NOTE — Patient Instructions (Addendum)

## 2011-06-30 NOTE — Op Note (Signed)
[facility] [address] [place], [place]  [address]  ENDOSCOPY PROCEDURE REPORT  PATIENT:  Lauren, [name]  MR#:  [id number] BIRTHDATE:  [birth year], 46 yrs. old  GENDER:  female ENDOSCOPIST:  Carie Caddy. Allani Reber, MD Referred by:  Priscella Mann, [name] PROCEDURE DATE:  06/30/2011 PROCEDURE:  EGD with biopsy for H. pylori 19147 ASA CLASS:  Class III INDICATIONS:  epigastric pain, GERD MEDICATIONS:   MAC sedation, administered by CRNA, propofol (Diprivan) 200 mg IV TOPICAL ANESTHETIC:  none  DESCRIPTION OF PROCEDURE:   After the risks benefits and alternatives of the procedure were thoroughly explained, informed consent was obtained.  The LB GIF-H180 [id number] endoscope was introduced through the mouth and advanced to the second portion of the duodenum, without limitations.  The instrument was slowly withdrawn as the mucosa was fully examined. <<PROCEDUREIMAGES>>  The esophagus and gastroesophageal junction were completely normal in appearance.  Moderate erosive gastritis was found in the body and the antrum of the stomach. Biopsies of the antrum and body of the stomach were obtained and sent to pathology.  Mild duodenitis was found in the bulb of the duodenum.  Normal otherwise in the second portion of the duodenum.    Retroflexed views revealed no abnormalities.    The scope was then withdrawn from the patient and the procedure completed.  COMPLICATIONS:  None  ENDOSCOPIC IMPRESSION: 1) Normal esophagus 2) Moderate erosive gastritis in the body and the antrum of the stomach.  Multiple biopsies obtained and sent to pathology. 3) Mild duodenitis in the bulb of duodenum 4) Normal in the second portion duodenum  RECOMMENDATIONS: 1) Await pathology results 2) Avoid NSAIDs 3) Follow-up of helicobacter pylori status, treat if indicated 4) Continue taking your PPI (antiacid medicine) once daily. It is best to be taken 20-30 minutes prior to breakfast meal.  Carie Caddy. Rhea Belton,  MD  CC:  The Patient Priscella Mann MD  n. Rosalie DoctorCarie Caddy. Colbin Jovel at 06/30/2011 11:10 AM  Rosalie Doctor, [phone]

## 2011-06-30 NOTE — Op Note (Signed)
[facility] [address] [place], [place]  [address]  COLONOSCOPY PROCEDURE REPORT  PATIENT:  [name], [name]  MR#:  [id number] BIRTHDATE:  1965-07-06, 46 yrs. old  GENDER:  female ENDOSCOPIST:  Carie Caddy. Elexus Barman, MD REF. BY:  Priscella Mann, [name] PROCEDURE DATE:  06/30/2011 PROCEDURE:  Colonoscopy with snare polypectomy ASA CLASS:  Class III INDICATIONS:  FOBT positive stool, 1st colonoscopy MEDICATIONS:   MAC sedation, administered by CRNA, propofol (Diprivan) 230 mg IV  DESCRIPTION OF PROCEDURE:   After the risks benefits and alternatives of the procedure were thoroughly explained, informed consent was obtained.  Digital rectal exam was performed and revealed no rectal masses.   The LB CF-H180AL [id number] endoscope was introduced through the anus and advanced to the terminal ileum which was intubated for a short distance, without limitations. The quality of the prep was excellent, using MoviPrep.  The instrument was then slowly withdrawn as the colon was fully examined. <<PROCEDUREIMAGES>>               FINDINGS:  The terminal ileum appeared normal.  Two sessile 4 - 5 mm polyps were found in the transverse colon, 1 with mucus cap. Polyps were snared without cautery. Retrieval was successful.  A 3 mm sessile polyp was found in the sigmoid colon. Polyp wa s snared without cautery. Retrieval was successful.  Retroflexed views in the rectum revealed internal hemorrhoids.   The scope was then withdrawn from the cecum and the procedure completed.  COMPLICATIONS:  None  ENDOSCOPIC IMPRESSION: 1) Normal terminal ileum 2) Two polyps in the transverse colon. Removed and sent to pathology. 3) Sessile polyp in the sigmoid colon. Removed and sent to pathology. 4) Small internal hemorrhoids  RECOMMENDATIONS: 1) Await pathology results    2) If the polyps removed today are proven to be adenomatous (pre-cancerous) polyps, you will need a colonoscopy in 3 years. Otherwise you  should continue to follow colorectal cancer screening guidelines for "routine risk" patients with a colonoscopy in 10 years. You will receive a letter within 1-2 weeks with the results of your biopsy as well as final recommendations. Please call my office if you have not received a letter after 3 weeks.  Carie Caddy. Rhea Belton, MD  CC:  The Patient Priscella Mann MD  n. Rosalie DoctorCarie Caddy. Taj Nevins at 06/30/2011 11:16 AM  Rosalie Doctor, [phone]

## 2011-06-30 NOTE — Progress Notes (Signed)
Patient did not have preoperative order for IV antibiotic SSI prophylaxis. (G8918)  Patient did not experience any of the following events: a burn prior to discharge; a fall within the facility; wrong site/side/patient/procedure/implant event; or a hospital transfer or hospital admission upon discharge from the facility. (G8907)  

## 2011-07-01 ENCOUNTER — Telehealth: Payer: Self-pay

## 2011-07-01 NOTE — Telephone Encounter (Signed)
  Follow up Call-  Call back number 06/30/2011  Post procedure Call Back phone  # [phone]  Permission to leave phone message Yes     Patient questions:  Do you have a fever, pain , or abdominal swelling? no Pain Score  0 *  Have you tolerated food without any problems? yes  Have you been able to return to your normal activities? yes  Do you have any questions about your discharge instructions: Diet   no Medications  no Follow up visit  no  Do you have questions or concerns about your Care? no  Actions: * If pain score is 4 or above: No action needed, pain <4. The pt asked if she should continue diclocymine that her medical md gave her.  I advised her Dr. Terri Piedra recommended taking PPI and if she needed the dicloymine it was okay. Maw

## 2011-07-05 ENCOUNTER — Encounter: Payer: Self-pay | Admitting: Internal Medicine

## 2011-07-12 ENCOUNTER — Telehealth: Payer: Self-pay | Admitting: Family Medicine

## 2011-07-12 ENCOUNTER — Encounter: Payer: Self-pay | Admitting: Family Medicine

## 2011-07-12 DIAGNOSIS — R195 Other fecal abnormalities: Secondary | ICD-10-CM | POA: Insufficient documentation

## 2011-07-12 NOTE — Telephone Encounter (Signed)
Spoke with patient and she states she was not given percocet on 04/18  was given samples of a medication. States she has not gotten Percocet  since December when Dr. Bluford Kaufmann park last prescribed it for her.Advised will double check with Dr. Bluford Kaufmann park.Marland Kitchen

## 2011-07-12 NOTE — Telephone Encounter (Signed)
Pt has an appt on 5/20 but needs refill on her percocet until she can come in.  pls call when ready

## 2011-07-12 NOTE — Telephone Encounter (Signed)
Dr. Madolyn Frieze states she will not give any narcotics  Before office visit. Was given Norco in November for 3 months. Patient doesn't remember  this  but will check with pharmacy to see if she has refills left.

## 2011-07-12 NOTE — Telephone Encounter (Signed)
I no longer give this patient percocet.  I see that it was given 06/24/2011 by Alonna Buckler, I suspect she works with GI.  Patient will need to ask them for more pain meds if she needs.   Will you please call and let her know Larita Fife?

## 2011-07-12 NOTE — Telephone Encounter (Signed)
Will forward to MD.  

## 2011-07-26 ENCOUNTER — Ambulatory Visit: Payer: Medicaid Other | Admitting: Family Medicine

## 2011-07-26 ENCOUNTER — Telehealth: Payer: Self-pay | Admitting: Family Medicine

## 2011-07-26 NOTE — Telephone Encounter (Signed)
Will you please give patient information about this?  She has not seen any one recently, so if you would give her the list of local providers, that would work. Thank you.

## 2011-07-26 NOTE — Telephone Encounter (Signed)
Patient is calling to find out who at [facility] she is to contact for counseling.

## 2011-07-27 NOTE — Telephone Encounter (Signed)
Spoke with patient and gave her the number to the [facility] Patient expressed undersatnding.

## 2011-08-16 ENCOUNTER — Ambulatory Visit: Payer: Medicaid Other | Admitting: Family Medicine

## 2011-08-30 ENCOUNTER — Ambulatory Visit: Payer: Medicaid Other | Admitting: Family Medicine

## 2011-09-07 ENCOUNTER — Telehealth: Payer: Self-pay | Admitting: Family Medicine

## 2011-09-07 NOTE — Telephone Encounter (Signed)
Will you please notify patient that her Ativan cannot be re-filled without an appointment? It is a controlled substance, and she needs an appointment for it be prescribed.  Thank you.

## 2011-09-15 ENCOUNTER — Encounter: Payer: Self-pay | Admitting: Family Medicine

## 2011-09-15 ENCOUNTER — Ambulatory Visit (INDEPENDENT_AMBULATORY_CARE_PROVIDER_SITE_OTHER): Payer: Medicaid Other | Admitting: Family Medicine

## 2011-09-15 VITALS — BP 120/80 | HR 69 | Temp 98.9°F | Ht 68.0 in | Wt 229.0 lb

## 2011-09-15 DIAGNOSIS — F411 Generalized anxiety disorder: Secondary | ICD-10-CM

## 2011-09-15 DIAGNOSIS — R05 Cough: Secondary | ICD-10-CM

## 2011-09-15 DIAGNOSIS — R350 Frequency of micturition: Secondary | ICD-10-CM

## 2011-09-15 DIAGNOSIS — IMO0001 Reserved for inherently not codable concepts without codable children: Secondary | ICD-10-CM

## 2011-09-15 DIAGNOSIS — M7918 Myalgia, other site: Secondary | ICD-10-CM | POA: Insufficient documentation

## 2011-09-15 LAB — POCT URINALYSIS DIPSTICK
Leukocytes, UA: NEGATIVE
Nitrite, UA: NEGATIVE
Urobilinogen, UA: 0.2
pH, UA: 5.5

## 2011-09-15 MED ORDER — LORAZEPAM 2 MG PO TABS: 2.0000 mg | ORAL_TABLET | Freq: Every day | ORAL | Status: DC | PRN

## 2011-09-15 MED ORDER — GABAPENTIN 100 MG PO CAPS: ORAL_CAPSULE | ORAL | Status: DC

## 2011-09-15 MED ORDER — FLUTICASONE PROPIONATE 50 MCG/ACT NA SUSP: 2.0000 | Freq: Every day | NASAL | Status: DC

## 2011-09-15 MED ORDER — CYCLOBENZAPRINE HCL 10 MG PO TABS: 10.0000 mg | ORAL_TABLET | Freq: Three times a day (TID) | ORAL | Status: DC | PRN

## 2011-09-15 MED ORDER — DULOXETINE HCL 60 MG PO CPEP: 60.0000 mg | ORAL_CAPSULE | Freq: Every day | ORAL | Status: DC

## 2011-09-15 NOTE — Progress Notes (Signed)
  Subjective:    Patient ID: [name], female    DOB: 01-20-1966, 46 y.o.   MRN: [id number]  HPI 1. Acute visit: worsening right buttock pain She has a history of lumbar disc herniation.  Her pain has gotten worse and it has been bothering her a lot over the past few weeks after hitting that side on a cabinet. She says that sometimes she stumbles.  She denies stool incontinence or numbness. See below regarding urinary issues.  ROS: no significant radiation of pain   2. Acute visit: urinary frequency and urgency. She has been urinating every 15 minutes for the past 3-4 days.  She sometimes wets herself when she laughs or coughs.  ROS: She denies dysuria/fevers. She is having some nausea and had 1 episode of vomiting 4 days ago. She denies vaginal discharge. She is not sexually active; her boyfriend has been diagnosed with cancer.   Review of Systems Per HPI.   Past Medical History, Family History, Social History, Allergies, and Medications reviewed. Hypothyroidism, congenital lymphedema, anxiety/depression    Objective:   Physical Exam GEN: NAD; obese; well-appearing PSYCH: engaged, her thoughts wander frequently but she is easily redirectable and apologetic, appears anxious, alert and oriented ABD: NABS, soft, non-tender, obese MSK:    Severe point area of tenderness right gluteal area; no mid-line spinal tenderness   Adduction/abduction 4/5 bilaterally and leg strength 4/5 bilaterally    Sensation intact   Gait: mildly antalgic EXT: 3+ pedal edema (lymphedema; baseline)    Assessment & Plan:

## 2011-09-15 NOTE — Patient Instructions (Addendum)
Right buttock pain: -Take gabapentin as directed. -Try the muscle relaxant. -Take ibuprofen 800 mg every 8 hours as needed for breakthrough pain.   Urinary frequency. -If your tests are abnormal, I will call you. Otherwise we will discuss on Monday.   Follow-up on Monday.

## 2011-09-15 NOTE — Assessment & Plan Note (Signed)
New. Right SIJ pain. She has a history of lumbar disc herniation which may put her at an increased risk of overusing/misusing that side. No radiculopathy at this time.  Will try gabapentin and flexeril, warm compresses.

## 2011-09-15 NOTE — Assessment & Plan Note (Signed)
For the past few days.  D/Dx: UTI, hyperglycemia, incontinence UA>>>negative for infection POCT Glc>>>WNL  Despite acuteness of symptoms, patient likely has incontinence.  She will follow-up with me in a few days to discuss results and treatment.

## 2011-09-16 ENCOUNTER — Telehealth: Payer: Self-pay | Admitting: Family Medicine

## 2011-09-16 NOTE — Telephone Encounter (Signed)
Pt informed. [name]  

## 2011-09-16 NOTE — Telephone Encounter (Signed)
She will need to come in to have nausea evaluated.

## 2011-09-16 NOTE — Telephone Encounter (Signed)
Please inform her that her glucose and urine test was normal. We will talk more about what we can do for her symptoms are her follow-up next week.   Thank you.

## 2011-09-16 NOTE — Telephone Encounter (Signed)
Patient was giving the results of her labs.  She is now asking for a Rx for something for nausea.  She uses [facility] [address].

## 2011-09-16 NOTE — Telephone Encounter (Signed)
Patient is calling for her lab results from yesterday.

## 2011-09-20 ENCOUNTER — Ambulatory Visit (INDEPENDENT_AMBULATORY_CARE_PROVIDER_SITE_OTHER): Payer: Medicaid Other | Admitting: Family Medicine

## 2011-09-20 ENCOUNTER — Encounter: Payer: Self-pay | Admitting: Family Medicine

## 2011-09-20 VITALS — BP 130/84 | HR 74 | Temp 99.0°F | Ht 68.0 in | Wt 229.0 lb

## 2011-09-20 DIAGNOSIS — IMO0001 Reserved for inherently not codable concepts without codable children: Secondary | ICD-10-CM

## 2011-09-20 DIAGNOSIS — N943 Premenstrual tension syndrome: Secondary | ICD-10-CM

## 2011-09-20 DIAGNOSIS — R350 Frequency of micturition: Secondary | ICD-10-CM

## 2011-09-20 DIAGNOSIS — M7918 Myalgia, other site: Secondary | ICD-10-CM

## 2011-09-20 NOTE — Assessment & Plan Note (Addendum)
Patient seems to have significant pre-menstrual symptoms (urinary frequency, mood lability, worsened depression).  She is interested in a hysterectomy to help with this. I suggested she follow-up with gynecology whom she has seen in the past for irregular bleeding. She underwent an ablation at that time.  My concern is that the patient frequently asks for procedures to help with vague symptoms. I do not think her mood disorder is adequately controlled. She has tried to see psychiatrists in the past but has not been able to afford them with her Medicaid.  She is currently on Cymbalta, but I am not sure how much this is helping. I think cognitive-behavioral therapy rather than medication may probably be more helpful to her. She has been on different anti-depressants in the past. I am not sure how compliant she was with them, but they do not seem to have provided significant improvement. She reports compliance but cannot remember the name of the medications.  I will see if she would be an appropriate patient for Dr. Jerene Pitch. Kathrynn Running to see at [facility]. I did not change her Cymbalta at this time.

## 2011-09-20 NOTE — Assessment & Plan Note (Signed)
Unchanged. Patient did not want to take "any more medications" and so did not take Flexeril and gabapentin but took leftover Percocet instead.  Discussed how I would not be giving her any more Percocet at this time for her pain and how Flexeril and gabapentin would be a more appropriate regimen. We can consider other options if this regimen does not work, but encouraged her to try this regimen first.  Patient expressed understanding.  Will follow-up in 1 month to re-evaluate.

## 2011-09-20 NOTE — Progress Notes (Signed)
  Subjective:    Patient ID: [name], female    DOB: 17-Dec-1965, 46 y.o.   MRN: [id number]  HPI 1. Follow-up: urinary frequency Her symptoms are improving now that she started her period.  She reports urinary frequency around the time of her period, which she has twice a month.   2. Follow-up: right-sided back pain She says she took 2 percocet which she had left over and it is helping.  She is not taking flexeril or Neurontin which was prescribed for her last time.   3. Patient interested in a hysterectomy.  She says that her pre-menstrual symptoms (irritability, crying spells, feelings of hopelessness, urinary frequency) are severe. She thinks a hysterectomy will help her symptoms. She says that she feels so depressed that she does not have the energy to hurt herself. She denies suicidal ideation/homicidal ideation. She thinks going to a treatment center would help her. She says that she is fine right now. She does not want to go to [facility] for an admission. She is busy this week and needs to pay her bills and take care of her errands.  She has a history of depression. She is taking Cymbalta. She is not sure if the Cymbalta is helping but she reports compliance.   Review of Systems She reports a "low-grade" subjective fever a few days ago She was complaining of nausea a few days ago. Now resolved.   Past Medical History, Family History, Social History, Allergies, and Medications reviewed. Significant for history of anxiety/depression, hypothyroidism, irregular menstrual bleeding s/p ablation, congenital lymphedema, chronic abdominal pain (suspect IBS), lumbar disc herniation (managing conservatively)     Objective:   Physical Exam GEN: NAD; obese; well-appearing, well-groomed PSYCH: engaged, appropriate to questions, she bounces from one topic to another but is easily re-directable (baseline), not depressed or anxious-appearing; not hyperactive    Assessment & Plan:

## 2011-09-20 NOTE — Assessment & Plan Note (Signed)
This is associated with her periods as part of pre-menstrual syndrome.

## 2011-09-20 NOTE — Patient Instructions (Addendum)
For right-buttock pain: -Flexeril (muscle relaxant): 1 tablet every 8 hours -Neurontin (for nerve pain): 2 capsules at bedtime  -Call and make an appointment at [facility] to talk about your symptoms and your interest in a hysterectomy.   GO TO [facility] if you feel like you want to hurt yourself or others. -Please call our psychologist and make an appointment.   Follow-up before August 10 for Ativan re-fill.  We will also need to repeat a urinalysis in 1-2 months.

## 2011-09-27 ENCOUNTER — Telehealth: Payer: Self-pay | Admitting: Psychology

## 2011-09-27 ENCOUNTER — Telehealth: Payer: Self-pay | Admitting: Family Medicine

## 2011-09-27 NOTE — Telephone Encounter (Signed)
Message copied by [facility], Etta Quill on Mon Sep 27, 2011 11:26 AM ------      Message from: Spero Geralds E      Created: Mon Sep 27, 2011 11:20 AM       She sounds really complex.  Would be happy to see her - probably first in [facility] and if necessary, she can be seen in [facility].  She can call me directly at [phone] to schedule.  I'll let you know if she does.                              ----- Message -----         From: Shelly Rubenstein, MD         Sent: 09/20/2011   2:19 PM           To: Threasa Beards, PsyD            Do you think she would be an appropriate patient for you to see? Please read A?P under "Premenstrual syndrome".

## 2011-09-27 NOTE — Telephone Encounter (Signed)
Patient interested in seeing Dr. Jerene Pitch. Kathrynn Running. Called and gave her number to schedule appointment.

## 2011-09-27 NOTE — Telephone Encounter (Signed)
[name] called to schedule an appointment.  Reviewed Dr. Bluford Kaufmann Park's last note and scheduled her in [facility] initially.  Appointment is July 29th at 3:00.    I told her the following: -  If she is unable to make the appointment she needs to call me. -  If she misses the appointment without a phone call, I won't be able to schedule her back in my clinic. She voiced an understanding and was able to repeat the appointment date and time back to me.

## 2011-10-04 ENCOUNTER — Ambulatory Visit: Payer: Medicaid Other | Admitting: Psychology

## 2011-10-08 ENCOUNTER — Ambulatory Visit: Payer: Medicaid Other | Admitting: Family Medicine

## 2011-10-25 ENCOUNTER — Ambulatory Visit: Payer: Medicaid Other | Admitting: Family Medicine

## 2012-01-07 ENCOUNTER — Other Ambulatory Visit: Payer: Self-pay | Admitting: Family Medicine

## 2012-01-18 ENCOUNTER — Encounter: Payer: Medicaid Other | Admitting: Family Medicine

## 2012-01-18 ENCOUNTER — Telehealth: Payer: Self-pay | Admitting: Family Medicine

## 2012-01-18 NOTE — Telephone Encounter (Signed)
This has happened a few times in the past where a patient requested narcotics before her appointment and informed her I will no longer do this. I understand she had an appointment and missed the bus. Still I do not think it is appropriate to fill her medications without an appointment. I am available Thursday all day and Friday afternoon if she wants to be seen earlier than next week.    I spoke with patient informed her and she expresses understanding and will call if she wants to reschedule her appointment.

## 2012-01-18 NOTE — Telephone Encounter (Signed)
Had to resch appt due to her not getting the bus in time.  Pt is needing enough pains meds to last until next Wed [facility] [address]  Alt # [phone]

## 2012-01-26 ENCOUNTER — Ambulatory Visit (INDEPENDENT_AMBULATORY_CARE_PROVIDER_SITE_OTHER): Payer: Medicaid Other | Admitting: Family Medicine

## 2012-01-26 ENCOUNTER — Encounter: Payer: Self-pay | Admitting: Family Medicine

## 2012-01-26 VITALS — BP 126/85 | HR 78 | Temp 99.0°F | Ht 68.0 in | Wt 220.0 lb

## 2012-01-26 DIAGNOSIS — Z87891 Personal history of nicotine dependence: Secondary | ICD-10-CM

## 2012-01-26 DIAGNOSIS — E039 Hypothyroidism, unspecified: Secondary | ICD-10-CM

## 2012-01-26 DIAGNOSIS — H538 Other visual disturbances: Secondary | ICD-10-CM

## 2012-01-26 DIAGNOSIS — H5710 Ocular pain, unspecified eye: Secondary | ICD-10-CM

## 2012-01-26 DIAGNOSIS — IMO0001 Reserved for inherently not codable concepts without codable children: Secondary | ICD-10-CM

## 2012-01-26 DIAGNOSIS — R200 Anesthesia of skin: Secondary | ICD-10-CM

## 2012-01-26 DIAGNOSIS — R209 Unspecified disturbances of skin sensation: Secondary | ICD-10-CM

## 2012-01-26 DIAGNOSIS — F411 Generalized anxiety disorder: Secondary | ICD-10-CM

## 2012-01-26 DIAGNOSIS — M7918 Myalgia, other site: Secondary | ICD-10-CM

## 2012-01-26 DIAGNOSIS — R202 Paresthesia of skin: Secondary | ICD-10-CM | POA: Insufficient documentation

## 2012-01-26 DIAGNOSIS — H5711 Ocular pain, right eye: Secondary | ICD-10-CM

## 2012-01-26 DIAGNOSIS — R05 Cough: Secondary | ICD-10-CM

## 2012-01-26 LAB — GLUCOSE, CAPILLARY: Glucose-Capillary: 87 mg/dL (ref 70–99)

## 2012-01-26 MED ORDER — LEVOTHYROXINE SODIUM 175 MCG PO TABS: 175.0000 ug | ORAL_TABLET | Freq: Every day | ORAL | Status: DC

## 2012-01-26 MED ORDER — LORAZEPAM 2 MG PO TABS: 2.0000 mg | ORAL_TABLET | Freq: Every day | ORAL | Status: DC | PRN

## 2012-01-26 MED ORDER — VARENICLINE TARTRATE 0.5 MG X 11 & 1 MG X 42 PO MISC: ORAL | Status: DC

## 2012-01-26 MED ORDER — CYCLOBENZAPRINE HCL 10 MG PO TABS: 10.0000 mg | ORAL_TABLET | Freq: Three times a day (TID) | ORAL | Status: DC | PRN

## 2012-01-26 MED ORDER — FLUTICASONE PROPIONATE 50 MCG/ACT NA SUSP: 2.0000 | Freq: Every day | NASAL | Status: DC

## 2012-01-26 MED ORDER — GABAPENTIN 100 MG PO CAPS: ORAL_CAPSULE | ORAL | Status: DC

## 2012-01-26 MED ORDER — LORAZEPAM 2 MG PO TABS: 4.0000 mg | ORAL_TABLET | Freq: Every day | ORAL | Status: DC | PRN

## 2012-01-26 MED ORDER — DULOXETINE HCL 60 MG PO CPEP: 60.0000 mg | ORAL_CAPSULE | Freq: Every day | ORAL | Status: DC

## 2012-01-26 NOTE — Addendum Note (Signed)
Addended by: Madolyn Frieze, Marylene Land J on: 01/26/2012 07:50 PM   Modules accepted: Orders

## 2012-01-26 NOTE — Assessment & Plan Note (Addendum)
For the past week that is associated with blurred vision. This has been going on intermittently for the past week but is worse in the morning.  Patient reports history of "eye surgeries" although she is not sure about the details We will refer to ophthalmology for evaluation and to rule-out potential early glaucoma. She does tend to somatization and often comes in with multiple complaints and does not have signs of glaucoma but her acute right eye pain with blurred vision is concerning, and I would like her ruled out for glaucoma

## 2012-01-26 NOTE — Assessment & Plan Note (Signed)
She quit 14 days ago and is requesting Chantix to maintain abstinence. Rx given.

## 2012-01-26 NOTE — Progress Notes (Signed)
   Subjective:    Patient ID: [name], female    DOB: Oct 24, 1965, 46 y.o.   MRN: [id number]  HPI # Tingling and numbness in both legs for the past few months It used to just happen twice a month but is now happening all the time It is worse when she sits and her legs fall asleep when she uses the toilet  Alleviated by: nothing  # She has had blurry vision for the past week. She denies trauma It is intermittent and worse in the right eye in the morning. She is also complaining of some right pain with these episodes.  She has had "two surgeries" in her eyes and recalls getting a bug bite on her right eye in the past.  She has seen an eye doctor in the past, maybe a year ago.  # She stopped smoking 14 days ago and is requesting Chantix  Review of Systems Per HPI Denies worsening urinary incontinence and denies bowel incontinence Denies leg weakness  Allergies, medication, past medical history reviewed.  She has been out of her medications.    Objective:   Physical Exam GEN: NAD; well-nourished, -appearing; obese OPH: fundal discs sharp EYE: EOMI; PERRL; normal conjunctiva FACE: no facial swelling or tenderness  PSYCH: pleasant; normal speech CV: RRR, no murmurs, normal S1/S2 PULM: NI WOB ABD: NABS, soft, NT, obese EXT: 2+ femoral, popliteal, and DP/PT pulses; no calf tenderness, redness, or swelling.  NEURO: 5/5 strength lower extremities, sensation intact     Assessment & Plan:

## 2012-01-26 NOTE — Assessment & Plan Note (Signed)
She has a history of lymphedema and disc disease.  Her ABI 05/2011 were relatively normal; small abnormality may have been due to lymphedema. She has good pulses today and no signs of DVT or signs concerning for worsening disc disease.  She was advised to restart her muscle relaxant prn, thyroid medication, Cymbalta, and gabapentin for which she has been out. Refills sent.  She was advised to follow-up in 1 month if her symptoms are not improved after restarting these medications. I wonder if birth control would be helpful since she tends to get several somatic symptoms at particular times of the month twice a month. She has a uterus but is s/p tubal ligation.  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 We will not check lab work today. Patient amenable to plan.

## 2012-01-26 NOTE — Patient Instructions (Addendum)
We will refer you to an eye doctor Call if you don't hear from anyone in 1 week  Follow-up at your conveneince regarding your stomach pain  Follow-up in 1 month regarding your tingling and numbness  I am so proud of you for stopping smoking! It was nice to see you today.

## 2012-02-02 ENCOUNTER — Telehealth: Payer: Self-pay | Admitting: Family Medicine

## 2012-02-02 NOTE — Telephone Encounter (Signed)
Spoke with pt and advised that there are not a lot of opthalmologist who accept medicaid and that Dr. Karleen Hampshire just happens to be one of the few.  Advised that if she wanted to that she could call different offices and find an opthalmologist who will accept medicaid and we would be happy to give them a referral.  Pt states "no I'll just keep the one on January".     Dr. Madolyn Frieze I told her I would send the message to you about the birth control. Fleeger, Maryjo Rochester

## 2012-02-02 NOTE — Telephone Encounter (Signed)
Patient  Is calling asking for a referrral to a different Eye doctor.  She had to cancel her appt yesterday due to the weather and they won't be albe to get her in again until mid January, so she would like to try and see a different doctor sooner.  Also, the referral needs to be for the eye examine and glasses.  She also wants to start the Birth Control that was discussed at her last appt.  She uses [facility] on [address].

## 2012-02-03 NOTE — Telephone Encounter (Signed)
Please have her come in to discuss birth control options.

## 2012-02-23 ENCOUNTER — Ambulatory Visit: Payer: Medicaid Other | Admitting: Family Medicine

## 2012-03-09 ENCOUNTER — Telehealth: Payer: Self-pay | Admitting: *Deleted

## 2012-03-09 NOTE — Telephone Encounter (Signed)
refll request received for triamcinolone acetonide cream. Not on med list . Request placed in MD box.

## 2012-03-10 NOTE — Telephone Encounter (Signed)
Filled and placed in fax box 

## 2012-03-14 ENCOUNTER — Ambulatory Visit: Payer: Medicaid Other | Admitting: Family Medicine

## 2012-03-23 ENCOUNTER — Encounter: Payer: Self-pay | Admitting: *Deleted

## 2012-03-23 ENCOUNTER — Telehealth: Payer: Self-pay

## 2012-03-23 NOTE — Telephone Encounter (Signed)
Pt returned call to [place].

## 2012-03-23 NOTE — Telephone Encounter (Signed)
Pt called and stated that she is a pt of Dr. Ellin Saba- had a couple of questions for the nurse of a f/u appt she had.  Re: Pt has an appt scheduled with clinics on Feb 6th @ 145pm with Dr. Erin Fulling for vaginal issues/ endometrial ablation. Pt has not been seen at our office since 2012 but she did have the Novasure procedure with Dr. Marice Potter 12/21/2010.  Called pt and left message that we are returning her call.

## 2012-03-23 NOTE — Telephone Encounter (Signed)
Called [name] and she states she had an ablation before and she was told her uterus is tilted and the doctor talked to her about a ring that would lift the uterus up. States she is having problems with her uterus falling and causing issues with intercourse. We discussed it sounds like a pessary and I gave her information about that- we also discussed sometimes surgery is another option and she can discuss all that with the doctor at the appointment she already has scheduled for 04/13/12. Patient states she does not have computer access and would like something mailed to her about pessary. I informed her we would send her something- also verified her address.

## 2012-03-23 NOTE — Telephone Encounter (Signed)
Called patient and left  A message we are returning your call, sorry we missed you, please call clinic back during office hours.

## 2012-04-02 ENCOUNTER — Telehealth: Payer: Self-pay | Admitting: Family Medicine

## 2012-04-02 NOTE — Telephone Encounter (Signed)
Several months ago colonoscopy and had polyps removed. Patient states that for years her "uterus and stomach" hurt Every 2 weeks associated with "hormonal problems". Sometimes has emesis with this and states 3 days ago she noted a small amount of blood in her vomit at that time.   Denies chest pain, shortness of breath, generalized weakness, current vomiting, inability to keep down solids, fever. States she wants something that she was prescribed months ago called "illed" Which i am unfamiliar with. Patient informed no new prescriptions over the phone. She has an appointment with Dr. Madolyn Frieze at 2pm tomorrow. Encouraged her to keep this appointment or go to urgent care if she develops the above symptoms.

## 2012-04-03 ENCOUNTER — Other Ambulatory Visit (HOSPITAL_COMMUNITY)
Admission: RE | Admit: 2012-04-03 | Discharge: 2012-04-03 | Disposition: A | Discharge status: Home / Self Care | Payer: Medicaid Other | Source: Ambulatory Visit | Attending: Family Medicine | Admitting: Family Medicine

## 2012-04-03 ENCOUNTER — Ambulatory Visit (INDEPENDENT_AMBULATORY_CARE_PROVIDER_SITE_OTHER): Payer: Medicaid Other | Admitting: Family Medicine

## 2012-04-03 ENCOUNTER — Encounter: Payer: Self-pay | Admitting: Family Medicine

## 2012-04-03 VITALS — BP 120/80 | HR 76 | Temp 99.5°F | Ht 68.0 in | Wt 234.0 lb

## 2012-04-03 DIAGNOSIS — Z113 Encounter for screening for infections with a predominantly sexual mode of transmission: Secondary | ICD-10-CM | POA: Insufficient documentation

## 2012-04-03 DIAGNOSIS — K2990 Gastroduodenitis, unspecified, without bleeding: Secondary | ICD-10-CM

## 2012-04-03 DIAGNOSIS — K2970 Gastritis, unspecified, without bleeding: Secondary | ICD-10-CM

## 2012-04-03 DIAGNOSIS — M7918 Myalgia, other site: Secondary | ICD-10-CM

## 2012-04-03 DIAGNOSIS — R350 Frequency of micturition: Secondary | ICD-10-CM

## 2012-04-03 DIAGNOSIS — IMO0001 Reserved for inherently not codable concepts without codable children: Secondary | ICD-10-CM

## 2012-04-03 DIAGNOSIS — R1013 Epigastric pain: Secondary | ICD-10-CM

## 2012-04-03 LAB — POCT URINALYSIS DIPSTICK
Leukocytes, UA: NEGATIVE
Nitrite, UA: NEGATIVE
Protein, UA: NEGATIVE
Urobilinogen, UA: 0.2
pH, UA: 6

## 2012-04-03 LAB — POCT UA - MICROSCOPIC ONLY

## 2012-04-03 MED ORDER — OMEPRAZOLE 40 MG PO CPDR: 40.0000 mg | DELAYED_RELEASE_CAPSULE | Freq: Every day | ORAL | Status: DC

## 2012-04-03 MED ORDER — GABAPENTIN 100 MG PO CAPS: ORAL_CAPSULE | ORAL | Status: DC

## 2012-04-03 NOTE — Patient Instructions (Signed)
Start omeprazole to see if this helps your stomach pain  Follow-up with me on 02/10 (Monday) or later to talk about your thyroid and other issues

## 2012-04-04 DIAGNOSIS — G8929 Other chronic pain: Secondary | ICD-10-CM | POA: Insufficient documentation

## 2012-04-04 MED ORDER — ONDANSETRON HCL 4 MG PO TABS: 4.0000 mg | ORAL_TABLET | Freq: Three times a day (TID) | ORAL | Status: DC | PRN

## 2012-04-04 NOTE — Progress Notes (Signed)
  Subjective:    Patient ID: [name], female    DOB: March 25, 1965, 47 y.o.   MRN: [id number]  HPI She was here for a physical, however, she is having abdominal pain and wanted this addressed today instead.   # She reports abdominal pain for the past few weeks but she started vomiting blood that started about 4 days ago. She had 2 episodes where she vomited food and then some blood. She has had decreased appetite as well but she reports that she usually "doesn't eat that much". She seems to eat 1-2 meals a day usually in the afternoon, often skipping breakfast. She reports being diagnosed with gastritis when she saw Dr. Rhea Belton 06/2011 for an EGD and colonoscopy.   Results were reviewed with patient. It showed mild inflammation in the body and antrum of the stomach. Biopsy taken at that time did not show H. Pylori. She was recommended dicyclomine and Align.  She does not think she was started on a PPI.   She has a history of multiple complaints and has seen several specialists. She has a history of chronic pain. It seems like this abdominal pain is a chronic problem but started worsening recently and now with her having blood in her emesis, patient is requesting to be referred back to Dr. Rhea Belton.   Alleviated by: nothing Exacerbated by: nothing. Including food.  Medications tried: none  Review of Systems Denies dizziness, lightheadedness Denies weight changes Denies constipation  Allergies, medication, past medical history reviewed.  Smoking status noted. 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

## 2012-04-04 NOTE — Assessment & Plan Note (Signed)
Acute on chronic abdominal pain.  EGD on 06/2010 showed mild inflammation in body and antrum.  She is not sure if Align and Bentyl which had helped in the past is providing any relief, but she would like to continue it.  Although she may have an organic cause, her being an anxious person at baseline and reassurance in having multiple consultation may be contributing.  -Start omeprazole -Refer to Dr. Rhea Belton for evaluation>>>appointment on 02/11 -She was advised to follow-up if her symptoms worsen, she has persistent nausea/vomiting. Consider checking CMET, lipase, POC H. Pylori at that time.

## 2012-04-13 ENCOUNTER — Ambulatory Visit: Payer: Medicaid Other | Admitting: Obstetrics & Gynecology

## 2012-04-13 ENCOUNTER — Encounter: Payer: Self-pay | Admitting: Internal Medicine

## 2012-04-18 ENCOUNTER — Ambulatory Visit: Payer: Medicaid Other | Admitting: Internal Medicine

## 2012-04-20 ENCOUNTER — Ambulatory Visit: Payer: Medicaid Other | Admitting: Family Medicine

## 2012-04-23 ENCOUNTER — Encounter: Payer: Self-pay | Admitting: Family Medicine

## 2012-04-24 ENCOUNTER — Ambulatory Visit: Payer: Medicaid Other | Admitting: Family Medicine

## 2012-04-24 ENCOUNTER — Telehealth: Payer: Self-pay | Admitting: Family Medicine

## 2012-04-24 NOTE — Telephone Encounter (Signed)
Informed pt that we really didn't know if stiches were needed until the actual procedure.  Pt agreeable and appt rescheduled. Fleeger, Maryjo Rochester

## 2012-04-24 NOTE — Telephone Encounter (Signed)
Pt had to resch appt for today - couldn't get to bus -  Wants to know if she will need stitches and if she will be able to walk on her foot.  pls advise

## 2012-04-27 ENCOUNTER — Ambulatory Visit: Payer: Medicaid Other | Admitting: Family Medicine

## 2012-04-27 ENCOUNTER — Telehealth: Payer: Self-pay | Admitting: Family Medicine

## 2012-04-27 NOTE — Telephone Encounter (Addendum)
Patient states she  has  history of lymphadema  since she was [age] years old.  When she is on her period swelling gets worse in legs and feet. She is currently on period. She is scheduled for wart removal today. Consulted with Dr. Leveda Anna and he advises best to reschedule since she is at a slight risk of  Infection. Patient will call back to reschedule.

## 2012-04-27 NOTE — Telephone Encounter (Signed)
Message left to return call.

## 2012-04-27 NOTE — Telephone Encounter (Signed)
Patient would like to speak to the nurse about whether it is ok for her to keep her appt for wart removal since she has swelling in her legs and she has started her period.  She wants to make sure this is still ok and won't possibly cause her to get an infection.

## 2012-05-01 ENCOUNTER — Telehealth: Payer: Self-pay | Admitting: Family Medicine

## 2012-05-01 MED ORDER — PREGABALIN 75 MG PO CAPS: 75.0000 mg | ORAL_CAPSULE | Freq: Two times a day (BID) | ORAL | Status: DC

## 2012-05-01 NOTE — Telephone Encounter (Signed)
Telephone. She is complaining of hip and knee pain. Gabapentin has helped some in the past. She would like to try Lyrica after seeing it on commercial. Rx for Lyrica sent; advised to stop gabapentin. Keep follow-up 03/04.

## 2012-05-01 NOTE — Telephone Encounter (Signed)
Pt missed her appt due to the Lipodema.  Need something sent in for pain.  Have resched appt for 3/4 @ 11:00.

## 2012-05-02 ENCOUNTER — Ambulatory Visit: Payer: Medicaid Other | Admitting: Family Medicine

## 2012-05-09 ENCOUNTER — Ambulatory Visit: Payer: Medicaid Other | Admitting: Family Medicine

## 2012-05-15 ENCOUNTER — Ambulatory Visit: Payer: Medicaid Other | Admitting: Family Medicine

## 2012-05-16 ENCOUNTER — Ambulatory Visit: Payer: Medicaid Other | Admitting: Family Medicine

## 2012-05-29 ENCOUNTER — Encounter: Payer: Self-pay | Admitting: Family Medicine

## 2012-05-29 ENCOUNTER — Ambulatory Visit (INDEPENDENT_AMBULATORY_CARE_PROVIDER_SITE_OTHER): Payer: Medicaid Other | Admitting: Family Medicine

## 2012-05-29 VITALS — BP 112/75 | HR 61 | Temp 98.6°F | Ht 68.0 in | Wt 238.0 lb

## 2012-05-29 DIAGNOSIS — I890 Lymphedema, not elsewhere classified: Secondary | ICD-10-CM

## 2012-05-29 DIAGNOSIS — R1011 Right upper quadrant pain: Secondary | ICD-10-CM

## 2012-05-29 DIAGNOSIS — F411 Generalized anxiety disorder: Secondary | ICD-10-CM

## 2012-05-29 DIAGNOSIS — Z87891 Personal history of nicotine dependence: Secondary | ICD-10-CM

## 2012-05-29 DIAGNOSIS — R1013 Epigastric pain: Secondary | ICD-10-CM

## 2012-05-29 DIAGNOSIS — R635 Abnormal weight gain: Secondary | ICD-10-CM

## 2012-05-29 DIAGNOSIS — N943 Premenstrual tension syndrome: Secondary | ICD-10-CM

## 2012-05-29 LAB — COMPREHENSIVE METABOLIC PANEL
Albumin: 3.9 g/dL (ref 3.5–5.2)
BUN: 9 mg/dL (ref 6–23)
CO2: 23 mEq/L (ref 19–32)
Glucose, Bld: 84 mg/dL (ref 70–99)
Sodium: 138 mEq/L (ref 135–145)
Total Bilirubin: 0.4 mg/dL (ref 0.3–1.2)
Total Protein: 6.4 g/dL (ref 6.0–8.3)

## 2012-05-29 LAB — LIPID PANEL
Cholesterol: 278 mg/dL — ABNORMAL HIGH (ref 0–200)
Total CHOL/HDL Ratio: 3.3 Ratio

## 2012-05-29 MED ORDER — PREGABALIN 150 MG PO CAPS: 150.0000 mg | ORAL_CAPSULE | Freq: Two times a day (BID) | ORAL | Status: DC

## 2012-05-29 MED ORDER — LORAZEPAM 2 MG PO TABS: 4.0000 mg | ORAL_TABLET | Freq: Every day | ORAL | Status: DC | PRN

## 2012-05-29 NOTE — Assessment & Plan Note (Signed)
She started smoking again. See "abdominal pain" A/P

## 2012-05-29 NOTE — Patient Instructions (Addendum)
Follow-up in 2 weeks. We may consider starting birth control and remove wart at that time.   We will check your thyroid hormone and liver test today.   I will refer you to our health coach.

## 2012-05-29 NOTE — Assessment & Plan Note (Signed)
See abdominal pain A/P 

## 2012-05-29 NOTE — Assessment & Plan Note (Addendum)
This does not appear worse than prior.  -Try increasing Lyrica, which she has noted significant improvement from, to see if this helps. 150 bid. (may go up to 450 daily) -Re-check TSH  -Refer to [facility] for evaluation. She had seen someone in the past several years ago per patient and would like to see a specialist again to see if any new therapies are available.

## 2012-05-29 NOTE — Assessment & Plan Note (Addendum)
She now presents with RUQ pain. She has a history of chronic abdominal pain, diagnosed with IBS.  -Check LFTs, lipase today due to her nausea/vomiting  -Continue Align, Bentyl -Consider checking H. Pylori  I think her abdominal pain may be part of her PDD. We discussed potentially starting OCPs to see if this helps her symptoms. Patient seemed very interested, however, her had started smoking recently. She gave me her cigarettes and resolved to quit. We will follow-up in 2 weeks to re-asses.

## 2012-05-29 NOTE — Progress Notes (Signed)
  Subjective:    Patient ID: [name], female    DOB: 1965-06-25, 47 y.o.   MRN: [id number]  HPI # She feels like her lymphedema is acting up since January.  She feels like her lymphedema has spread to her stomach. She feels burning, her skin got red and feverish.  She has been "suffering through it" and keep her legs elevated.  She thinks this is due to her menstrual periods because her body usually acts up during this time.  She had a rash but this has resolved.   # Premenstrual dysphoric disorder She gets mood swings, worsening lymphedema symptoms, rash around the time of her periods, which occur twice a month  # Tobacco She recently started smoking again 1/2-1 ppd  Review of Systems Endorses nausea with vomiting. Last vomited 5 days ago.   Allergies, medication, past medical history reviewed.  Smoking status noted.     Objective:   Physical Exam GEN: NAD; overweight EXT: significant lymphedema, 1-2+ pitting ABD: tender in right upper quadrant; NABS; soft, no guarding; no rash PSYCH: appropriate to questions    Assessment & Plan:

## 2012-05-30 ENCOUNTER — Telehealth: Payer: Self-pay | Admitting: Family Medicine

## 2012-05-30 DIAGNOSIS — E039 Hypothyroidism, unspecified: Secondary | ICD-10-CM

## 2012-05-30 LAB — LIPASE: Lipase: 18 U/L (ref 0–75)

## 2012-05-30 NOTE — Telephone Encounter (Signed)
Telephone discuss lab results.  Elevated TSH. Advised make lab appointment to check T3/T4. Orders in.  Cholesterol elevated. Encourage lifestyle modification. She does not have any CV risk factors, so we will defer starting medication at this time.

## 2012-05-31 ENCOUNTER — Ambulatory Visit: Payer: Medicaid Other | Admitting: Obstetrics & Gynecology

## 2012-05-31 ENCOUNTER — Other Ambulatory Visit: Payer: Self-pay

## 2012-05-31 DIAGNOSIS — R609 Edema, unspecified: Secondary | ICD-10-CM

## 2012-06-05 ENCOUNTER — Other Ambulatory Visit: Payer: Medicaid Other

## 2012-06-06 ENCOUNTER — Other Ambulatory Visit: Payer: Medicaid Other

## 2012-06-08 ENCOUNTER — Other Ambulatory Visit: Payer: Self-pay | Admitting: Family Medicine

## 2012-06-08 ENCOUNTER — Other Ambulatory Visit: Payer: Medicaid Other

## 2012-06-08 DIAGNOSIS — E039 Hypothyroidism, unspecified: Secondary | ICD-10-CM

## 2012-06-08 LAB — T3, FREE: T3, Free: 1.7 pg/mL — ABNORMAL LOW (ref 2.3–4.2)

## 2012-06-08 LAB — T4, FREE: Free T4: 0.99 ng/dL (ref 0.80–1.80)

## 2012-06-08 NOTE — Progress Notes (Signed)
FT-4 AND FT-3 DONE TODAY [name]

## 2012-06-10 ENCOUNTER — Telehealth: Payer: Self-pay | Admitting: Family Medicine

## 2012-06-10 DIAGNOSIS — E039 Hypothyroidism, unspecified: Secondary | ICD-10-CM

## 2012-06-10 MED ORDER — LEVOTHYROXINE SODIUM 200 MCG PO TABS: 200.0000 ug | ORAL_TABLET | Freq: Every day | ORAL | Status: DC

## 2012-06-10 NOTE — Assessment & Plan Note (Signed)
Telephone. Discussed low T3, normal T4. She is feeling fatigued, has gained weight; denies depressive symptoms; endorses dry skin.   -Increase Synthroid from 175 to 200 -Follow-up in 3 months for reevaluation and repeat labs; should order TSH, T3 and T4 at that time  

## 2012-06-10 NOTE — Telephone Encounter (Signed)
Telephone. Discussed low T3, normal T4. She is feeling fatigued, has gained weight; denies depressive symptoms; endorses dry skin.   -Increase Synthroid from 175 to 200 -Follow-up in 3 months for reevaluation and repeat labs; should order TSH, T3 and T4 at that time

## 2012-06-12 ENCOUNTER — Telehealth: Payer: Self-pay | Admitting: Family Medicine

## 2012-06-12 ENCOUNTER — Ambulatory Visit: Payer: Medicaid Other | Admitting: Family Medicine

## 2012-06-12 NOTE — Telephone Encounter (Signed)
Pt had appt today but she couldn't come because of the weather - she has to ride the bus and was afraid of having wart removed in this weather.  Wants Dr Madolyn Frieze to call her to discuss her cholesterol meds and then she wants to resch her appt.

## 2012-06-12 NOTE — Telephone Encounter (Signed)
Please advise patient to make appointment to discuss her cholesterol labs and medications. If she has problems with her cholesterol medication, please advise she come in soon (I am in clinic this afternoon and tomorrow).

## 2012-06-13 ENCOUNTER — Ambulatory Visit: Payer: Medicaid Other | Admitting: Family Medicine

## 2012-06-29 ENCOUNTER — Encounter: Payer: Self-pay | Admitting: Vascular Surgery

## 2012-06-30 ENCOUNTER — Encounter: Payer: Self-pay | Admitting: Vascular Surgery

## 2012-06-30 ENCOUNTER — Encounter (INDEPENDENT_AMBULATORY_CARE_PROVIDER_SITE_OTHER): Payer: Medicaid Other | Admitting: *Deleted

## 2012-06-30 ENCOUNTER — Telehealth: Payer: Self-pay | Admitting: Family Medicine

## 2012-06-30 ENCOUNTER — Ambulatory Visit (INDEPENDENT_AMBULATORY_CARE_PROVIDER_SITE_OTHER): Payer: Medicaid Other | Admitting: Vascular Surgery

## 2012-06-30 VITALS — BP 132/87 | HR 69 | Ht 68.0 in | Wt 230.0 lb

## 2012-06-30 DIAGNOSIS — R609 Edema, unspecified: Secondary | ICD-10-CM

## 2012-06-30 DIAGNOSIS — I890 Lymphedema, not elsewhere classified: Secondary | ICD-10-CM

## 2012-06-30 DIAGNOSIS — M7989 Other specified soft tissue disorders: Secondary | ICD-10-CM

## 2012-06-30 NOTE — Telephone Encounter (Signed)
Patient is calling because she just left the vascular doctor about her leg and they said there isn't much they could do about her leg.  Her leg is hurting her so bad she needs something for the pain.

## 2012-06-30 NOTE — Telephone Encounter (Signed)
Spoke with [name].  I advised if OTC analgesics not helping, she needs to come in and be seen. Of note, Vascular referred her to specialist in [place] regarding lymphedema and recommending aggressive massage therapy which patient has declined in the past.

## 2012-06-30 NOTE — Progress Notes (Addendum)
[facility] [place]  Referred by:  Shelly Rubenstein, MD [address] [place], [place] [address]  Reason for referral: Swollen legs with lifetime of lymphedema.    History of Present Illness  [name] is a 47 y.o. (07-31-1965) female who presents with chief complaint: swollen leg.  Patient notes, onset of swelling constant with increased pain in the right leg and foot for the past few days.  The patient has had no history of DVT, positive history of varicose vein, positive history of venous stasis ulcers, positive history of  Lymphedema and positive history of skin changes in lower legs.  There is no family history of venous disorders.  The patient has  used compression stockings in the past.  Past Medical History  Diagnosis Date  . Migraine   . Fibroadenosis breast   . Syphilis Treated in early 2012 during hospitalization  . Depression   . Lymph edema     both legs and feet  . Anxiety   . Hypothyroidism   . COPD (chronic obstructive pulmonary disease)   . Hyperlipidemia     no meds - diet controlled  . Heart murmur     as child  . GERD (gastroesophageal reflux disease)     does not take med  . Arthritis     knees,hands, hip  . Cellulitis   . Plantar fasciitis   . Varicose veins     Past Surgical History  Procedure Laterality Date  . Thyroid surgery  2004    removed  . Cyst removed      behind left knee and left cheek  . Norplant removal      right upper arm - 12/2001  . Tubal ligation      2003  . Incision and drainage abscess / hematoma of bursa / knee / thigh      right upper thigh - 2008  . Eye surgery      bilarteral  . Svd      x 2  . Wisdom tooth extraction    . Endometrial ablation      History   Social History  . Marital Status: Divorced    Spouse Name: N/A    Number of Children: 2  . Years of Education: N/A   Occupational History  . disabled    Social History Main Topics  . Smoking status: Current Every Day  Smoker -- 0.50 packs/day for 25 years    Types: Cigarettes  . Smokeless tobacco: Never Used  . Alcohol Use: Yes     Comment: occasionally beer   . Drug Use: Yes    Special: Marijuana     Comment: past hx 86yrs ago  . Sexually Active: Yes    Birth Control/ Protection: Surgical   Other Topics Concern  . Not on file   Social History Narrative   Lives with son in [place].     Family History  Problem Relation Age of Onset  . Diabetes Mother   . Hypertension Mother   . Hyperlipidemia Mother   . Diabetes Father   . Heart disease Father   . Hyperlipidemia Father   . Hypertension Father   . Diabetes Paternal Grandmother     Current Outpatient Prescriptions on File Prior to Visit  Medication Sig Dispense Refill  . fluticasone (FLONASE) 50 MCG/ACT nasal spray Place 2 sprays into the nose daily.  16 g  0  . levothyroxine (SYNTHROID, LEVOTHROID) 200 MCG tablet Take  1 tablet (200 mcg total) by mouth daily.  30 tablet  6  . LORazepam (ATIVAN) 2 MG tablet Take 2 tablets (4 mg total) by mouth daily as needed for anxiety.  60 tablet  2  . ondansetron (ZOFRAN) 4 MG tablet Take 1 tablet (4 mg total) by mouth every 8 (eight) hours as needed for nausea.  20 tablet  0  . pregabalin (LYRICA) 150 MG capsule Take 1 capsule (150 mg total) by mouth 2 (two) times daily.  60 capsule  0  . PROAIR HFA 108 (90 BASE) MCG/ACT inhaler INHALE 2 PUFFS INTO THE LUNGS EVERY 4 HOURS AS NEEDED FOR WHEEZING OR SHORTNESS OF BREATH  8.5 each  0  . dicyclomine (BENTYL) 10 MG capsule Take 10 mg by mouth. Take 2 tablets once a day      . omeprazole (PRILOSEC) 40 MG capsule Take 1 capsule (40 mg total) by mouth daily.  30 capsule  3  . varenicline (CHANTIX PAK) 0.5 MG X 11 & 1 MG X 42 tablet Take one 0.5 mg tablet by mouth once daily for 3 days, then increase to one 0.5 mg tablet twice daily for 4 days, then increase to one 1 mg tablet twice daily.  53 tablet  0   No current facility-administered medications on file  prior to visit.    Allergies  Allergen Reactions  . Vicodin (Hydrocodone-Acetaminophen)     Itch    REVIEW OF SYSTEMS:  (Positives checked otherwise negative)  CARDIOVASCULAR:  [ ]  chest pain, [ ]  chest pressure, [ ]  palpitations, [x ] shortness of breath when laying flat, [x ] shortness of breath with exertion,   [x ] pain in feet when walking, [ ]  pain in feet when laying flat, [ ]  history of blood clot in veins (DVT), [x ] history of phlebitis, [x ] swelling in legs, [x ] varicose veins  PULMONARY:  [x ] productive cough, [ ]  asthma, [ ]  wheezing [x]  COPD  NEUROLOGIC:  [ ]  weakness in arms or legs, [ ]  numbness in arms or legs, [ ]  difficulty speaking or slurred speech, [ ]  temporary loss of vision in one eye, [ ]  dizziness  HEMATOLOGIC:  [ ]  bleeding problems, [ ]  problems with blood clotting too easily  MUSCULOSKEL:  [ ]  joint pain, [ ]  joint swelling  GASTROINTEST:  [ ]   Vomiting blood, [ ]   Blood in stool [x]  vomiting   GENITOURINARY:  [ ]   Burning with urination, [ ]   Blood in urine  PSYCHIATRIC:  [ ]  history of major depression  INTEGUMENTARY:  [ ]  rashes, [ ]  ulcers [x]  woody edema  CONSTITUTIONAL:  [ ]  fever, [x ] chills  Physical Examination  Filed Vitals:   06/30/12 1304  BP: 132/87  Pulse: 69  Height: 5\' 8"  (1.727 m)  Weight: 230 lb (104.327 kg)   Body mass index is 34.98 kg/(m^2).  General: A&O x 3, WDWN, tearful and nauseas.  Head: [place]/AT  Ear/Nose/Throat: Hearing grossly intact, nares w/o erythema or drainage, oropharynx w/o Erythema/Exudate, Hearing loss, positive Nasal drainage  Eyes: PERRLA, EOMI  Neck: Supple, no nuchal rigidity  Pulmonary: Sym exp, slow air movt, CTAB, no rales, rhonchi, & wheezing  Cardiac: RRR, Nl S1, S2, no Murmurs, rubs or gallops  Vascular: Vessel Right Left  Radial Palpable Palpable  Ulnar Palpable Palpable  Brachial Palpable Palpable  Carotid Palpable, without bruit Palpable, without bruit  Aorta Not palpable  N/A  Femoral Palpable Palpable  Popliteal Not palpable Not palpable  PT not Palpable not Palpable  DP not Palpable not Palpable   Gastrointestinal: soft, NTND, -G/R, - HSM, - masses.  Musculoskeletal: M/S 5/5 throughout  except , Extremities without ischemic changes.  Woody edema bilateral LE  Neurologic: CN 2-12 intact, Pain and light touch intact in extremities, Motor exam as listed above.  Pain with palpation right lower leg and dorsum of the fot  Psychiatric: Judgment intact, Mood & affect appropriate with episodes of tearful burst due to frustration with physical problems  Dermatologic: See M/S exam for extremity exam, no rashes otherwise noted  Lymph : No Cervical, Axillary, or Inguinal lymphadenopathy above the knees. Positive lymphedema from the tibial tuberosity and down bilaterally.  Non-Invasive Vascular Imaging  BLE venous insufficiency duplex (Date: 06/30/12)  No DVT or SVT  No significant superficial venous reflux  Evidence of deep venous reflux  Small saphenous vein is competent   Outside Studies/Documentation 3 pages of outside documents were reviewed including: referal note from primary care physician Dr. Geoffry Paradise.  Medical Decision Making  [name] is a 47 y.o. female who presents with: chronic bilateral LE lymphedema, CVI   The patient was resistant proceeding with to the therapies available locally.  I recommend she seek a second opinion with Dr. Chapman Moss at [facility]   I discussed in depth with the patient the nature of chronic lymphedema, and emphasized the importance of maximal medical management including lymphatic PT, thigh high compression stockings 30 mm Hg, and good skin hygiene  Thank you for allowing [place] to participate in this patient's care.   [facility] Office: [phone] Clinton Gallant [facility] PA-C  06/30/2012, 1:28 PM  Addendum  I have independently interviewed and examined the patient, and I  agree with the physician assistant's findings.  This patient has a history and exam consistent with Lymphedema with a childhood onset.  She unfortunately has had multiple complications from her lymphema and appears to be non-compliant with compressive therapy due to "pain."  She also appears to have previous been seen at the local lymphedema clinic and is unwilling to return to that clinic.  She would like "surgical" consideration.  I had a discussion with this patient in regards to the management of lymphedema and she seems resistant to the traditional approach to management with compressive therapy, lymphatic massage, and possible sequential compression.  She demonstrated a theater sign and also some drug seeking behavior.  I suggested to the patient that if she would like a second opinion that we would refer her to Dr. Chapman Moss at [facility] to evaluate her.    Leonides Sake, MD [facility] [place] Office: [phone] Pager: [phone]  06/30/2012, 6:15 PM

## 2012-07-05 ENCOUNTER — Telehealth: Payer: Self-pay | Admitting: Vascular Surgery

## 2012-07-05 NOTE — Telephone Encounter (Signed)
Referral made to Dr Chapman Moss at [facility] as per request of Dr Leonides Sake.  Appt is for 09/12/12 @ 11:00am.  Left detailed message on patients home #, dpm

## 2012-07-15 ENCOUNTER — Other Ambulatory Visit: Payer: Self-pay | Admitting: Family Medicine

## 2012-07-25 ENCOUNTER — Telehealth: Payer: Self-pay | Admitting: Family Medicine

## 2012-07-25 NOTE — Telephone Encounter (Signed)
Patient is calling to discuss her visit to [facility] with Dr. Madolyn Frieze.

## 2012-07-25 NOTE — Telephone Encounter (Signed)
Spoke with patient who was wanting me to see if I can move up her appt at [facility] sooner than what is scheduled for July.  I explained to patient that she will have to call Dr. [facility] office and discuss with them because they made the referral.  Pt verbalized understanding.  [name] L

## 2012-07-27 ENCOUNTER — Other Ambulatory Visit: Payer: Self-pay | Admitting: Family Medicine

## 2012-07-27 ENCOUNTER — Telehealth: Payer: Self-pay | Admitting: Family Medicine

## 2012-07-27 NOTE — Telephone Encounter (Signed)
Pt is having pain in her arms and elbows and wants to know if she can go have exrays done before she sees her doctor Madolyn Frieze next week.  pls advise

## 2012-07-27 NOTE — Telephone Encounter (Signed)
Returned call to pt  - no answer at either number - pt must be seen by provider before any test or xrays may be done. Wyatt Haste, RN-BSN

## 2012-08-02 ENCOUNTER — Ambulatory Visit: Payer: Medicaid Other | Admitting: Family Medicine

## 2012-08-04 ENCOUNTER — Telehealth: Payer: Self-pay | Admitting: Vascular Surgery

## 2012-08-04 NOTE — Telephone Encounter (Addendum)
Message copied by Rosalyn Charters on Fri Aug 04, 2012  3:29 PM ------      Message from: Phillips Odor      Created: Fri Aug 04, 2012  1:15 PM      Regarding: referra to Dr. Chapman Moss @ [facility]      Contact: [phone]       This pt. Saw Dr. Imogene Burn recently and was referred to Dr. Chapman Moss @ [facility] for Lymphedema.  She said she has an appt. In July there, and wants her appt. Moved-up.  I explained that they morn than likely gave her an appt. At the 1st avail.  She is asking  To be notified of contact information re: this MD.  Can you call her with their office number, so she can call to request to be on a cancellation list.  (She wanted information mailed, but I assume that office will do that)  ------  Left dr. Jolene Schimke phone number for patient

## 2012-08-05 ENCOUNTER — Other Ambulatory Visit: Payer: Self-pay | Admitting: Family Medicine

## 2012-08-07 MED ORDER — PREGABALIN 150 MG PO CAPS: 150.0000 mg | ORAL_CAPSULE | Freq: Two times a day (BID) | ORAL | Status: DC

## 2012-08-07 NOTE — Telephone Encounter (Signed)
Please call in Rx for Lyrica Thank you

## 2012-08-07 NOTE — Telephone Encounter (Signed)
Lyrica called into [facility] pharmacy per MD request.  Radene Ou, CMA

## 2012-08-07 NOTE — Telephone Encounter (Signed)
WIll forward to pcp for review.

## 2012-08-07 NOTE — Telephone Encounter (Signed)
Pt also needs her inhaler refilled also

## 2012-08-08 ENCOUNTER — Other Ambulatory Visit: Payer: Self-pay | Admitting: *Deleted

## 2012-08-09 ENCOUNTER — Encounter: Payer: Self-pay | Admitting: Family Medicine

## 2012-08-09 ENCOUNTER — Ambulatory Visit (INDEPENDENT_AMBULATORY_CARE_PROVIDER_SITE_OTHER): Payer: Medicaid Other | Admitting: Family Medicine

## 2012-08-09 VITALS — BP 114/79 | HR 67 | Temp 98.2°F | Ht 68.0 in | Wt 235.0 lb

## 2012-08-09 DIAGNOSIS — M7711 Lateral epicondylitis, right elbow: Secondary | ICD-10-CM

## 2012-08-09 DIAGNOSIS — M7710 Lateral epicondylitis, unspecified elbow: Secondary | ICD-10-CM | POA: Insufficient documentation

## 2012-08-09 DIAGNOSIS — E669 Obesity, unspecified: Secondary | ICD-10-CM

## 2012-08-09 MED ORDER — TOPIRAMATE 25 MG PO TABS: 25.0000 mg | ORAL_TABLET | Freq: Two times a day (BID) | ORAL | Status: DC

## 2012-08-09 MED ORDER — PHENTERMINE HCL 15 MG PO CAPS: 15.0000 mg | ORAL_CAPSULE | ORAL | Status: DC

## 2012-08-09 MED ORDER — ALBUTEROL SULFATE HFA 108 (90 BASE) MCG/ACT IN AERS: 1.0000 | INHALATION_SPRAY | RESPIRATORY_TRACT | Status: DC | PRN

## 2012-08-09 MED ORDER — MELOXICAM 15 MG PO TABS: 15.0000 mg | ORAL_TABLET | Freq: Every day | ORAL | Status: DC

## 2012-08-09 NOTE — Progress Notes (Signed)
  Subjective:    Patient ID: [name], female    DOB: Jun 18, 1965, 47 y.o.   MRN: [id number]  HPI # Weight gain; tobacco use   Stopped smoking April 30  On Chantix  But endorses significant increase in appetite  She gained 5 lbs since March but she weighed 3 lbs more in April   She has not tried anything for her weight so far  # Right elbow pain For a while Progression: unchanged Medications tried: none, although Lyrica may help the pain "a little" She feels like her elbow is swollen and the veins are sticking out   Review of Systems Denies fevers, chills, nausea, vomiting  Allergies, medication, past medical history reviewed.  Smoking status noted.     Objective:   Physical Exam GEN: NAD; obese PSYCH: appropriate to questions; alert and oriented CV: RRR PULM: NI WOB RIGHT ELBOW: tenderness along lateral epicondyle, pain is worse with resisted extension of wrist although she has mild pain with wrist flexion as well; no obvious swelling, no joint warmth/erythema; 4/5 strength at elbow and wrist; sensation intact; 2+ radial pulses    Assessment & Plan:

## 2012-08-09 NOTE — Assessment & Plan Note (Signed)
She has gained a few pounds since her last visit but still within the baseline range of her weight.  She does have increased appetite now she quit smoking April 30th (!).  -Try phentermine and topiramate; advised to call if chest pain, palpitations on these medications  -Discussed how very important to incorporate good diet and exercise habits -Call and schedule appointment with nutritionist

## 2012-08-09 NOTE — Patient Instructions (Addendum)
Make an appointment with our nutritionist (try to make one for next Thursday 06/12) Diet and exercise are really important   Walk for 15-30 minutes, 5 x a week    Start phentermine and topiramate; 1 tablet of each a day (Try [employer], [employer], [employer]) The goal is the lose 5 lbs in 12 weeks; if this does not happen then we will stop thse medications  Follow-up in 2 weeks    For your elbow try the anti inflammatory (meloxicam) Do those exercises 15 x 3 sets at least once a day   Follow-up in 1 month for this issue

## 2012-08-09 NOTE — Assessment & Plan Note (Addendum)
It seems for the past several months. Initial encounter for this problem.  -Wrist extension strengthening exercises -Meloxicam qd -Icing -Follow-up 2-4 weeks

## 2012-08-17 ENCOUNTER — Ambulatory Visit (INDEPENDENT_AMBULATORY_CARE_PROVIDER_SITE_OTHER): Payer: Medicaid Other | Admitting: Family Medicine

## 2012-08-17 VITALS — Ht 68.0 in | Wt 236.5 lb

## 2012-08-17 DIAGNOSIS — E669 Obesity, unspecified: Secondary | ICD-10-CM

## 2012-08-17 DIAGNOSIS — E039 Hypothyroidism, unspecified: Secondary | ICD-10-CM

## 2012-08-17 MED ORDER — PRAVASTATIN SODIUM 40 MG PO TABS: 40.0000 mg | ORAL_TABLET | Freq: Every day | ORAL | Status: DC

## 2012-08-17 NOTE — Patient Instructions (Addendum)
1. Eat more regularly during the day   -Eat breakfast (1 hour after you get up), lunch, and dinner   -2 snacks during day  2. Eat more vegetables (not fried; baked, pan-fried, roasted are good); double the amount of vegetables as bread and meat   3. You may eat seafood!  4. Lean meats (without a lot of fat)   5. Limit 2 servings of bread per meal   6. Try walking 10 minutes at a time; most days out of the week.   7. Don't go shopping hungry!    GOOD FOODS:  -Fruits, vegetables -Leanest of meats (meats with the least amount of fat) -Low fat yogurt  BAD FOODS: -Fried foods

## 2012-08-17 NOTE — Progress Notes (Signed)
  Subjective:    Patient ID: [name], female    DOB: Jul 10, 1965, 47 y.o.   MRN: [id number]  HPI # Initial nutrition consultation:  Goals: lose weight; she gained since stopping smoking a few months ago   She would like to lose 50 lbs (185 lbs); she does not remember when she last weighed his much   Eating pattern: 2 meals (lunch and big dinner), 3 snack   Physical activity: none due to chronic leg and back pain   24-hr recall:   (UP at 7 AM) L (12 PM)-4 C corn pops, 1/2 C whole milk   D (6 PM)-8 brussel sprouts, 1.5 C mashed potatoes, 2 slices meatloaf; 2 C whole milk      Snk (1 AM)-bologna sandwich (1 slice bologna, 1 slice cheese) Typical eating day? Yes She does not drink sodas but drinks about 6 wine coolers a week  Review of Systems  Allergies, medication, past medical history reviewed.  Smoking status noted. Chronic back pain, lumbar disc herniation  Hypothyroidism on levothyroxine GERD    Objective:   Physical Exam GEN: NAD; obese PSYCH: pleasant; appropriate to questions    Assessment & Plan:

## 2012-08-17 NOTE — Assessment & Plan Note (Signed)
Weight gain especially after smoking cessation a few months ago.  Her diet is poor. See AVS for recommendations.  Encouraged regular physical activity during day (even if only 10 minutes at a time).  Follow-up prn.

## 2012-08-30 ENCOUNTER — Ambulatory Visit: Payer: Medicaid Other | Admitting: Family Medicine

## 2013-01-31 ENCOUNTER — Encounter: Payer: Medicaid Other | Admitting: Family Medicine

## 2013-02-12 ENCOUNTER — Other Ambulatory Visit: Payer: Self-pay | Admitting: Family Medicine

## 2013-02-12 DIAGNOSIS — F411 Generalized anxiety disorder: Secondary | ICD-10-CM

## 2013-02-12 MED ORDER — PREGABALIN 150 MG PO CAPS: ORAL_CAPSULE | ORAL | Status: DC

## 2013-02-12 MED ORDER — MELOXICAM 15 MG PO TABS: 15.0000 mg | ORAL_TABLET | Freq: Every day | ORAL | Status: DC

## 2013-02-12 MED ORDER — LORAZEPAM 2 MG PO TABS: 4.0000 mg | ORAL_TABLET | Freq: Every day | ORAL | Status: DC | PRN

## 2013-02-20 ENCOUNTER — Telehealth: Payer: Self-pay | Admitting: *Deleted

## 2013-02-20 NOTE — Telephone Encounter (Signed)
Prior authorization form for Lyrica (along with Medicaid preferred drug list) placed in MD box for completion.

## 2013-02-21 NOTE — Telephone Encounter (Signed)
I have completed PA, however, it is very difficult to answer these questions since I have never met the patient. She was given a one month refill and should have an appointment for additional refills. If Medicaid rejects the PA, she will need to come in sooner to discuss other options.  Thanks, [employer]. [place], [name]

## 2013-02-23 NOTE — Telephone Encounter (Signed)
Med approved through [employer].

## 2013-03-16 ENCOUNTER — Encounter: Payer: Medicaid Other | Admitting: Family Medicine

## 2013-03-28 ENCOUNTER — Encounter: Payer: Medicaid Other | Admitting: Family Medicine

## 2013-04-02 ENCOUNTER — Other Ambulatory Visit: Payer: Self-pay | Admitting: Family Medicine

## 2013-04-30 ENCOUNTER — Other Ambulatory Visit: Payer: Self-pay | Admitting: *Deleted

## 2013-05-07 ENCOUNTER — Telehealth: Payer: Self-pay | Admitting: Family Medicine

## 2013-05-07 MED ORDER — ALBUTEROL SULFATE HFA 108 (90 BASE) MCG/ACT IN AERS: INHALATION_SPRAY | RESPIRATORY_TRACT | Status: DC

## 2013-05-07 NOTE — Telephone Encounter (Signed)
Needs refills on lyrica, lorazepam and inhaler. Pharmacy says there are no refills on inhaler Pt has an appt Thurs march 5. She would like to get these today Please advise

## 2013-05-07 NOTE — Telephone Encounter (Signed)
Inhaler sent to pharmacy. Lyrica and Lorazepam cannot be sent electronically. Either then pharmacy can fax [place] a request, or I will give her a written Rx at her appointment on 3/5.  Thank you! Jeremiyah Cullens M. Nathanael Krist, [name]

## 2013-05-07 NOTE — Telephone Encounter (Signed)
LM for pt to call back.  Please let her know that rx for inhaler was sent in.  See if she would prefer calling her pharmacy and asking them to fax [place] a controlled medication refill request form or if she can wait til Thursday to receive new rx's.  Thanks [employer]

## 2013-05-10 ENCOUNTER — Encounter: Payer: Medicaid Other | Admitting: Family Medicine

## 2013-05-18 ENCOUNTER — Other Ambulatory Visit (HOSPITAL_COMMUNITY)
Admission: RE | Admit: 2013-05-18 | Discharge: 2013-05-18 | Disposition: A | Discharge status: Home / Self Care | Payer: Medicaid Other | Source: Ambulatory Visit | Attending: Family Medicine | Admitting: Family Medicine

## 2013-05-18 ENCOUNTER — Encounter: Payer: Self-pay | Admitting: Family Medicine

## 2013-05-18 ENCOUNTER — Ambulatory Visit (INDEPENDENT_AMBULATORY_CARE_PROVIDER_SITE_OTHER): Payer: Medicaid Other | Admitting: Family Medicine

## 2013-05-18 VITALS — BP 138/100 | HR 75 | Temp 99.0°F | Ht 68.0 in | Wt 234.1 lb

## 2013-05-18 DIAGNOSIS — R5383 Other fatigue: Secondary | ICD-10-CM

## 2013-05-18 DIAGNOSIS — F411 Generalized anxiety disorder: Secondary | ICD-10-CM

## 2013-05-18 DIAGNOSIS — R300 Dysuria: Secondary | ICD-10-CM

## 2013-05-18 DIAGNOSIS — Z01419 Encounter for gynecological examination (general) (routine) without abnormal findings: Secondary | ICD-10-CM | POA: Insufficient documentation

## 2013-05-18 DIAGNOSIS — F341 Dysthymic disorder: Secondary | ICD-10-CM

## 2013-05-18 DIAGNOSIS — R209 Unspecified disturbances of skin sensation: Secondary | ICD-10-CM

## 2013-05-18 DIAGNOSIS — R200 Anesthesia of skin: Secondary | ICD-10-CM

## 2013-05-18 DIAGNOSIS — R5381 Other malaise: Secondary | ICD-10-CM

## 2013-05-18 DIAGNOSIS — Z124 Encounter for screening for malignant neoplasm of cervix: Secondary | ICD-10-CM

## 2013-05-18 DIAGNOSIS — E039 Hypothyroidism, unspecified: Secondary | ICD-10-CM

## 2013-05-18 DIAGNOSIS — Z1151 Encounter for screening for human papillomavirus (HPV): Secondary | ICD-10-CM | POA: Insufficient documentation

## 2013-05-18 DIAGNOSIS — F329 Major depressive disorder, single episode, unspecified: Secondary | ICD-10-CM

## 2013-05-18 DIAGNOSIS — R202 Paresthesia of skin: Secondary | ICD-10-CM

## 2013-05-18 DIAGNOSIS — F419 Anxiety disorder, unspecified: Secondary | ICD-10-CM

## 2013-05-18 DIAGNOSIS — E785 Hyperlipidemia, unspecified: Secondary | ICD-10-CM

## 2013-05-18 DIAGNOSIS — F32A Depression, unspecified: Secondary | ICD-10-CM

## 2013-05-18 LAB — POCT URINALYSIS DIPSTICK
Bilirubin, UA: NEGATIVE
Glucose, UA: NEGATIVE
KETONES UA: NEGATIVE
LEUKOCYTES UA: NEGATIVE
Nitrite, UA: NEGATIVE
Protein, UA: NEGATIVE
Spec Grav, UA: 1.025
UROBILINOGEN UA: 0.2
pH, UA: 5.5

## 2013-05-18 LAB — CBC
HCT: 41.6 % (ref 36.0–46.0)
HEMOGLOBIN: 14.3 g/dL (ref 12.0–15.0)
MCH: 33.8 pg (ref 26.0–34.0)
MCHC: 34.4 g/dL (ref 30.0–36.0)
MCV: 98.3 fL (ref 78.0–100.0)
Platelets: 301 10*3/uL (ref 150–400)
RBC: 4.23 MIL/uL (ref 3.87–5.11)
RDW: 12.6 % (ref 11.5–15.5)
WBC: 4.5 10*3/uL (ref 4.0–10.5)

## 2013-05-18 LAB — POCT UA - MICROSCOPIC ONLY

## 2013-05-18 MED ORDER — MELOXICAM 15 MG PO TABS: 15.0000 mg | ORAL_TABLET | Freq: Every day | ORAL | Status: DC

## 2013-05-18 MED ORDER — ALBUTEROL SULFATE HFA 108 (90 BASE) MCG/ACT IN AERS: INHALATION_SPRAY | RESPIRATORY_TRACT | Status: DC

## 2013-05-18 MED ORDER — PREGABALIN 150 MG PO CAPS: ORAL_CAPSULE | ORAL | Status: DC

## 2013-05-18 MED ORDER — LORAZEPAM 2 MG PO TABS: 4.0000 mg | ORAL_TABLET | Freq: Every day | ORAL | Status: DC | PRN

## 2013-05-18 NOTE — Patient Instructions (Signed)
Stop by the lab today. Make an appointment with me within one month. You can schedule an appointment in pharmacy clinic for ABI to check your circulation the same day. You do not have a urine infection. We will refer you to [facility] to be seen for your pelvic pain.  [name] M. Madlynn Lundeen, [name]

## 2013-05-18 NOTE — Assessment & Plan Note (Signed)
Patient is concerned about poor circulation. Will send for ABI, although may be technically difficult due to lymphedema.

## 2013-05-18 NOTE — Progress Notes (Signed)
Patient ID: [name], female   DOB: Jan 20, 1966, 48 y.o.   MRN: [id number]    Subjective: HPI: Patient is a 48 y.o. female presenting to clinic today for CPE. She has a lot of concerns today. Patient reminded she has not followed up in over one year and she would need to return to address some of her concerns.  Concerns today include possible UTI, poor circulation and elbow pain  1. Urinary frequency- Concerned about UTI or ovarian cancer. Requesting antibiotic for urine infection. She also is concerned about ovarian cancer because she had cyst on ovary and read information that made her think it could be that (bloating, cramping, pelvic pain.) She denies dysuria, only reports increased urinary frequency. Urine "feels warm" but no blood or odor. She states she has had a fever this week, treated with ibuprofen. Patient endorses pain on right side radiating to back.  2. Hypothyroidism- Patient on thyroid medication. Last TSH was 44 and she did not follow up for recheck. Endorses feeling down, depressed and fatigued. Still taking medication. No SI/HI. She states she has a lot of pain as well.  3. Elbow pain- Diagnosed with tennis elbow in the past, pain in both arms. Pain on medial epicondyle and shoots down arms. Will discuss further at next follow up.  4. Poor circulation- Feels like her legs "go to sleep" if she is sitting a long time. She is on Lyrica which helps some. She has occasional jerking. She has lymphedema and this is causing a lot of swelling and discomfort. She does not have any tingling or specific distribution of pain.  History Reviewed: Former smoker- Quit April 2014 Health Maintenance: Needs labs and pap today.  ROS: Please see HPI above.  Objective: Office vital signs reviewed. BP 138/100  Pulse 75  Temp(Src) 99 F (37.2 C) (Oral)  Ht 5\' 8"  (1.727 m)  Wt 234 lb 1.6 oz (106.187 kg)  BMI 35.60 kg/m2  LMP 05/14/2013  Physical Examination:  General: Awake, alert.  NAD HEENT: Atraumatic, normocephalic. MMM. Hyperpigmented macules on soft palate and gums Neck: No masses palpated. No LAD Pulm: CTAB, no wheezes Cardio: RRR, no murmurs appreciated Abdomen:+BS, soft, nondistended. No abd TTP, but some TTP on right side mid-axillary line Back: Entire lumbar spine TTP  GU: Cervix slightly to pt's right side. Pap taken with bleeding after exam.  Extremities: 2+ edema with chronic skin changes Neuro: Grossly intact  Assessment: 49 y.o. female follow up appointment  Plan: See Problem List and After Visit Summary

## 2013-05-18 NOTE — Assessment & Plan Note (Signed)
UA wnl except lysed blood. No indication for antibiotics. F/u if worsens, but could be secondary to muscle spasm. Patient concerned about ovarian issues, will refer to [facility]. (Bimanual wnl.)

## 2013-05-18 NOTE — Addendum Note (Signed)
Addended by: [place], Tarron Krolak on: 05/18/2013 03:46 PM   Modules accepted: Orders

## 2013-05-18 NOTE — Assessment & Plan Note (Signed)
Contributing to many of her symptoms. Refilled Ativan. F/u within next month for check up and discuss medication.

## 2013-05-18 NOTE — Assessment & Plan Note (Signed)
TSH, T3, T4 today. Con't current dose until labs return. F/u in one month.

## 2013-05-19 LAB — BASIC METABOLIC PANEL
BUN: 8 mg/dL (ref 6–23)
CALCIUM: 9 mg/dL (ref 8.4–10.5)
CO2: 22 mEq/L (ref 19–32)
Chloride: 103 mEq/L (ref 96–112)
Creat: 0.85 mg/dL (ref 0.50–1.10)
Glucose, Bld: 83 mg/dL (ref 70–99)
Potassium: 3.9 mEq/L (ref 3.5–5.3)
SODIUM: 135 meq/L (ref 135–145)

## 2013-05-19 LAB — LIPID PANEL
CHOL/HDL RATIO: 3.3 ratio
Cholesterol: 212 mg/dL — ABNORMAL HIGH (ref 0–200)
HDL: 65 mg/dL (ref >39)
LDL CALC: 128 mg/dL — AB (ref 0–99)
TRIGLYCERIDES: 97 mg/dL (ref <150)
VLDL: 19 mg/dL (ref 0–40)

## 2013-05-19 LAB — T4, FREE: Free T4: 0.98 ng/dL (ref 0.80–1.80)

## 2013-05-19 LAB — TSH: TSH: 7.575 u[IU]/mL — ABNORMAL HIGH (ref 0.350–4.500)

## 2013-05-19 LAB — T3, FREE: T3, Free: 2.5 pg/mL (ref 2.3–4.2)

## 2013-05-22 ENCOUNTER — Encounter: Payer: Self-pay | Admitting: Family Medicine

## 2013-05-24 ENCOUNTER — Encounter: Payer: Self-pay | Admitting: Family Medicine

## 2013-06-15 ENCOUNTER — Telehealth: Payer: Self-pay | Admitting: Family Medicine

## 2013-06-15 NOTE — Telephone Encounter (Signed)
There is not a medication to fix circulation. We have to know why she has poor circulation. We can discuss options at her appointment so it is important for her to keep it.  Thanks, [employer]. Bufford Helms, [name]

## 2013-06-15 NOTE — Telephone Encounter (Signed)
Pt called and would like Dr. Sheral Apley to call her. jw

## 2013-06-15 NOTE — Telephone Encounter (Signed)
No answer and no machine.  Will await callback. [name]

## 2013-06-15 NOTE — Telephone Encounter (Signed)
Spoke with pt.  She states that her "circulation id getting worse and my legs keep falling asleep".  She says that the lyrica "helps with the tingling, but not with the circulation".  Wants to know if MD can call something in for her, because she rides the bus and cant get a ride here.  Advised I would send to MD. Laban Emperor Fleeger   FYI: pts appt with [facility] for ABIs moved up one day at pts request. Laban Emperor Fleeger

## 2013-06-19 ENCOUNTER — Ambulatory Visit: Payer: Medicaid Other | Admitting: Family Medicine

## 2013-06-20 ENCOUNTER — Ambulatory Visit: Payer: Medicaid Other | Admitting: Family Medicine

## 2013-06-21 ENCOUNTER — Ambulatory Visit: Payer: Medicaid Other | Admitting: Pharmacist

## 2013-06-22 ENCOUNTER — Ambulatory Visit: Payer: Medicaid Other | Admitting: Pharmacist

## 2013-06-22 ENCOUNTER — Ambulatory Visit: Payer: Medicaid Other | Admitting: Family Medicine

## 2013-07-02 ENCOUNTER — Ambulatory Visit (INDEPENDENT_AMBULATORY_CARE_PROVIDER_SITE_OTHER): Payer: Medicaid Other | Admitting: Pharmacist

## 2013-07-02 ENCOUNTER — Ambulatory Visit (INDEPENDENT_AMBULATORY_CARE_PROVIDER_SITE_OTHER): Payer: Medicaid Other | Admitting: Family Medicine

## 2013-07-02 ENCOUNTER — Encounter: Payer: Self-pay | Admitting: Pharmacist

## 2013-07-02 VITALS — BP 144/90 | HR 73 | Temp 98.6°F | Ht 69.0 in | Wt 240.0 lb

## 2013-07-02 VITALS — BP 144/90 | HR 73 | Ht 69.0 in | Wt 240.8 lb

## 2013-07-02 DIAGNOSIS — L02219 Cutaneous abscess of trunk, unspecified: Secondary | ICD-10-CM

## 2013-07-02 DIAGNOSIS — L02214 Cutaneous abscess of groin: Secondary | ICD-10-CM | POA: Insufficient documentation

## 2013-07-02 DIAGNOSIS — L03319 Cellulitis of trunk, unspecified: Secondary | ICD-10-CM

## 2013-07-02 DIAGNOSIS — R200 Anesthesia of skin: Secondary | ICD-10-CM

## 2013-07-02 DIAGNOSIS — R209 Unspecified disturbances of skin sensation: Secondary | ICD-10-CM

## 2013-07-02 DIAGNOSIS — I890 Lymphedema, not elsewhere classified: Secondary | ICD-10-CM

## 2013-07-02 DIAGNOSIS — R202 Paresthesia of skin: Secondary | ICD-10-CM

## 2013-07-02 DIAGNOSIS — E039 Hypothyroidism, unspecified: Secondary | ICD-10-CM

## 2013-07-02 DIAGNOSIS — L600 Ingrowing nail: Secondary | ICD-10-CM | POA: Insufficient documentation

## 2013-07-02 DIAGNOSIS — M7710 Lateral epicondylitis, unspecified elbow: Secondary | ICD-10-CM

## 2013-07-02 MED ORDER — MELOXICAM 15 MG PO TABS: 15.0000 mg | ORAL_TABLET | Freq: Every day | ORAL | Status: DC

## 2013-07-02 MED ORDER — OXYCODONE-ACETAMINOPHEN 10-325 MG PO TABS: 1.0000 | ORAL_TABLET | ORAL | Status: DC | PRN

## 2013-07-02 MED ORDER — LEVOTHYROXINE SODIUM 200 MCG PO TABS: 200.0000 ug | ORAL_TABLET | Freq: Every day | ORAL | Status: DC

## 2013-07-02 MED ORDER — SULFAMETHOXAZOLE-TRIMETHOPRIM 800-160 MG PO TABS: 1.0000 | ORAL_TABLET | Freq: Two times a day (BID) | ORAL | Status: DC

## 2013-07-02 NOTE — Patient Instructions (Signed)
Normal ABI - follow up with Dr. Sheral Apley.

## 2013-07-02 NOTE — Progress Notes (Signed)
Patient ID: [name], female   DOB: Sep 15, 1965, 48 y.o.   MRN: [id number]    Subjective: HPI: Patient is a 48 y.o. female presenting to clinic today for follow up appointment. Concerns today include boil right groin and ingrown toenail  1. ABI - Patient concerned about circulation in her legs. ABI performed today was normal. She would like to see someone for her lymphedema for a massage to see if that would help.  2. Ingrown toenail - Right great toenail started getting red and painful for the last 1 month. She has not done anything for this. She has never had an ingrown toenail before. She has not noticed any pus. Would prefer to see a foot doctor for evaluation.  3. Boil - Right groin abscess that comes and goes, states related to her menstrual cycle. Has 2 menstrual cycles per month and has a boil that come up with every period. She states this one came up recently and opened up last night. Red and painful, but draining. She has used warm compresses. No fevers.  4. Elbow - Bilateral elbow pain. Told she had tennis elbow before. She has tried Mobic and Vicodin (despite allergy) she states it helped some. She states the Lyrica helps the most.   History Reviewed: Former smoker. Health Maintenance: UTD  ROS: Please see HPI above.  Objective: Office vital signs reviewed. BP 144/90  Pulse 73  Temp(Src) 98.6 F (37 C) (Oral)  Ht 5\' 9"  (1.753 m)  Wt 240 lb (108.863 kg)  BMI 35.43 kg/m2  Physical Examination:  General: Awake, alert. NAD HEENT: Atraumatic, normocephalic. MMM Pulm: CTAB, no wheezes Cardio: RRR, no murmurs appreciated Abdomen: soft, nontender, nondistended GU: 2cm linear, firm erythematous nodule in right inguinal crease with large opening actively draining small amount of purulent fluid. Minimal surrounding erythema, no fluctuance appreciated. Upper extremity: TTP over later epicondyle bilaterally. Good grip strength, ROM of elbows and wrists. No effusion  appreciated Lower extremity: Diffuse edema. Right great toe with thickened, curved toenail. Some erythema at tip of toe and TTP. Residual pink polish noted on cuticle, but not erythematous at base of nail. Large horny/warty lesion on medial side of great toe. Neuro: Strength and sensation grossly intact  Assessment: 48 y.o. female office visit  Plan: See Problem List and After Visit Summary

## 2013-07-02 NOTE — Assessment & Plan Note (Signed)
A: Spontaneously draining. No definite cellulitis appreciated. Unsure about the intermittent collection of pus with menstrual cycle? Will defer to Gyn for further evaluation.  P: - Bactrim DS x10 days - Warm compresses and express fluid - Percocet prn pain - F/u if febrile, worsening pain or hardening of area

## 2013-07-02 NOTE — Assessment & Plan Note (Signed)
A: Exam consistent with tennis elbow bilaterally.  P: - Con't Lyrica - MObic prn - Avoid activities that make it worse - Can consider referral to [facility], but not sure of benefit at this time. - F/u prn

## 2013-07-02 NOTE — Patient Instructions (Signed)
It was good to see you today:  - Take the antibiotic for 10 days and the pain medicine as needed for your boil. Use heat on the area as well - We will get you in to see the foot doctor for your toenail - We will send you to PT for a lymphedema massage - Take Mobic daily for your elbows.  I will see you back in 1 month.  Aldine Chakraborty M. Amandajo Gonder, [name]

## 2013-07-02 NOTE — Assessment & Plan Note (Signed)
Needs refills. TSH elevated, T3/T4 wnl. Con't current dose and recheck in 6 months.

## 2013-07-02 NOTE — Assessment & Plan Note (Signed)
PAD evaluation: Normal ABI and low likelihood of PAD based on ABI of 0.97 in a patient with symptoms of tingling, numbness, and interstitial swelling in both legs and feet.   Patient requested information on massage for her lymphedema. Deferred this question to Dr. Sheral Apley.   Results reviewed and written information provided.   F/U Clinic Visit with Dr. Sheral Apley in 1 month.  Total time in face-to-face counseling 45 minutes.  Patient seen with Toribio Harbour, PharmD Candidate,  Silas Sacramento,  PharmD Candidate.

## 2013-07-02 NOTE — Assessment & Plan Note (Signed)
A: ABI wnl. Does have chronic edema which is painful for her.  P: - Con't Lyrica - Refer to PT for lymphedema treatment. Patient aware that medicaid may not cover this, but will place referral - Elevate legs and wear compression stockings. - F/u in 1 month

## 2013-07-02 NOTE — Progress Notes (Signed)
S:    Patient arrives for appointment with concerns about her circulation. She presents to the clinic for PADABI evaluation. Reports pain with walking, states it usually takes a few minutes after she stands before she can walk.  Pain is described as  numbness and tingling which occurs after a few minutes of exercise.  Reports pain starting while at rest or standing still.  Pain is localized to lower legs and feet. Patient reports a history of lymphedema "since she was born."  O:  Lower extremity Physical Exam includes diminished pulses and edema bilaterally. Ingrown toenail causing pain on right great toe.  Pulses were challenging to find in lower extremities with 3+edema however pulses were found in all sites.   ABI overall = 0.97. Right Arm 130 mmHg    Left Arm 140 mmHg Right ankle posterior tibial 136 mmHg     dorsalis pedis 126 mmHg Left ankle posterior tibial 130 mmHg    dorsalis pedis 136 mmHg   A/P: PAD evaluation: Normal ABI and low likelihood of PAD based on ABI of 0.97 in a patient with symptoms of tingling, numbness, and interstitial swelling in both legs and feet.   Patient requested information on massage for her lymphedema. Deferred this question to Dr. Sheral Apley.   Results reviewed and written information provided.   F/U Clinic Visit with Dr. Sheral Apley in 1 month.  Total time in face-to-face counseling 45 minutes.  Patient seen with Toribio Harbour, PharmD Candidate,  Silas Sacramento,  PharmD Candidate.

## 2013-07-02 NOTE — Progress Notes (Signed)
Patient ID: [name], female   DOB: [birth year], 48 y.o.   MRN: [id number] Reviewed: Agree with Dr. Graylin Shiver documentation and management.

## 2013-07-02 NOTE — Assessment & Plan Note (Signed)
A: Diffuse edema, but TTP of toe with curvature of great toenail. No s/sx of paronychia today.  P: - Refer to Podiatry for evaluation and removal of toenail - Return if worsens prior to appt with Podiatry.

## 2013-07-09 ENCOUNTER — Other Ambulatory Visit: Payer: Self-pay | Admitting: *Deleted

## 2013-07-09 DIAGNOSIS — R21 Rash and other nonspecific skin eruption: Secondary | ICD-10-CM

## 2013-07-09 MED ORDER — TRIAMCINOLONE ACETONIDE 0.5 % EX CREA: TOPICAL_CREAM | Freq: Two times a day (BID) | CUTANEOUS | Status: DC

## 2013-07-20 ENCOUNTER — Encounter: Payer: Self-pay | Admitting: Podiatrist

## 2013-07-20 ENCOUNTER — Ambulatory Visit (INDEPENDENT_AMBULATORY_CARE_PROVIDER_SITE_OTHER): Payer: Medicaid Other | Admitting: Podiatrist

## 2013-07-20 ENCOUNTER — Telehealth: Payer: Self-pay | Admitting: *Deleted

## 2013-07-20 VITALS — BP 110/61 | HR 83 | Resp 17 | Ht 68.0 in | Wt 240.0 lb

## 2013-07-20 DIAGNOSIS — B079 Viral wart, unspecified: Secondary | ICD-10-CM

## 2013-07-20 DIAGNOSIS — D492 Neoplasm of unspecified behavior of bone, soft tissue, and skin: Secondary | ICD-10-CM

## 2013-07-20 DIAGNOSIS — L600 Ingrowing nail: Secondary | ICD-10-CM

## 2013-07-20 DIAGNOSIS — M799 Soft tissue disorder, unspecified: Secondary | ICD-10-CM

## 2013-07-20 MED ORDER — OXYCODONE-ACETAMINOPHEN 5-325 MG PO TABS: 1.0000 | ORAL_TABLET | ORAL | Status: DC | PRN

## 2013-07-20 MED ORDER — AMOXICILLIN-POT CLAVULANATE 875-125 MG PO TABS: 1.0000 | ORAL_TABLET | Freq: Two times a day (BID) | ORAL | Status: DC

## 2013-07-20 NOTE — Patient Instructions (Signed)

## 2013-07-20 NOTE — Progress Notes (Signed)
   Subjective:    Patient ID: [name], female    DOB: Oct 26, 1965, 48 y.o.   MRN: [id number]  HPI Comments: N ingrown toenail L left 1st toenail both borders D couples months O C pain, swelling and encurvated borders A enclosed shoes, pressure, lymphedema T home surgery, soap cleanses     Review of Systems  Constitutional: Positive for unexpected weight change.  HENT: Positive for sinus pressure.   Respiratory: Positive for choking.   Cardiovascular: Positive for leg swelling.  Gastrointestinal: Positive for abdominal pain, constipation and abdominal distention.  Endocrine: Positive for polydipsia and polyuria.       Lymphedema  Genitourinary: Positive for urgency, frequency and difficulty urinating.  Musculoskeletal: Positive for arthralgias, back pain, gait problem and myalgias.  Skin: Positive for rash.  Neurological: Positive for numbness and headaches.  Hematological: Bruises/bleeds easily.  Psychiatric/Behavioral: Positive for confusion.  All other systems reviewed and are negative.      Objective:   Physical Exam Vascular status reveals palpable pedal pulses at 2/4 DP and PT bilateral. Lymphedema is present from distal digits proximally. Capillary refill time is within normal limits and digital hair growth is noted bilateral. Neurological sensation is intact via Semmes Weinstein monofilament at 5 out of 5 sites. Light touch, vibration, proprioception also intact bilateral. Musculoskeletal examination reveals acceptable muscle, strength, tone and stability bilateral. Rectus foot type is seen with mild pes planus deformity noted.  Dermatological examination reveals an ingrown medial nail border of the right hallux. Pain with direct pressure is noted. No redness, no swelling, no signs of infection are present. A verruca-type lesion is also present on the plantar lateral aspect of the right hallux as well.     Assessment & Plan:   ingrown toenail right first medial  nail border: Verruca lateral plantar great toe Plan: Treatment options and alternatives discussed.  Recommended permanent phenol matrixectomy and patient agreed.  Right great toe was prepped with alcohol and a 1 to 1 mix of 0.5% marcaine plain and 2% lidocaine plain was administered in a digital block fashion.  The toe was then prepped with betadine solution and exsanguinated.  The offending nail border was then excised and matrix tissue exposed.  Phenol was then applied to the matrix tissue followed by an alcohol wash.  Antibiotic ointment and a dry sterile dressing was applied.  I also excised the verruca is lesions and painted them with phenol. Applied Silvadene cream and a dry sterile compressive dressing and also sent the lesions for pathology. The  patient was dispensed instructions for aftercare.  I wrote a prescription for Augmentin as well as Percocet for pain. I will see her back in one week.

## 2013-07-20 NOTE — Telephone Encounter (Signed)
Right 1st toe skin wedge sent to [facility] for diagnosis of biopsy, possible verruca.  [employer] [facility] pick up same day.

## 2013-07-21 ENCOUNTER — Emergency Department (INDEPENDENT_AMBULATORY_CARE_PROVIDER_SITE_OTHER)
Admission: EM | Admit: 2013-07-21 | Discharge: 2013-07-21 | Disposition: A | Discharge status: Home / Self Care | Payer: Medicaid Other | Source: Home / Self Care | Attending: Emergency Medicine | Admitting: Emergency Medicine

## 2013-07-21 ENCOUNTER — Emergency Department (HOSPITAL_COMMUNITY)
Admission: EM | Admit: 2013-07-21 | Discharge: 2013-07-22 | Discharge status: Against Medical Advice | Payer: Medicaid Other | Attending: Emergency Medicine | Admitting: Emergency Medicine

## 2013-07-21 ENCOUNTER — Encounter (HOSPITAL_COMMUNITY): Payer: Self-pay | Admitting: Emergency Medicine

## 2013-07-21 DIAGNOSIS — G8918 Other acute postprocedural pain: Secondary | ICD-10-CM | POA: Insufficient documentation

## 2013-07-21 DIAGNOSIS — J4489 Other specified chronic obstructive pulmonary disease: Secondary | ICD-10-CM | POA: Insufficient documentation

## 2013-07-21 DIAGNOSIS — L03119 Cellulitis of unspecified part of limb: Secondary | ICD-10-CM

## 2013-07-21 DIAGNOSIS — M7989 Other specified soft tissue disorders: Secondary | ICD-10-CM | POA: Insufficient documentation

## 2013-07-21 DIAGNOSIS — L02619 Cutaneous abscess of unspecified foot: Secondary | ICD-10-CM

## 2013-07-21 DIAGNOSIS — J449 Chronic obstructive pulmonary disease, unspecified: Secondary | ICD-10-CM | POA: Insufficient documentation

## 2013-07-21 DIAGNOSIS — I890 Lymphedema, not elsewhere classified: Secondary | ICD-10-CM | POA: Insufficient documentation

## 2013-07-21 DIAGNOSIS — M79609 Pain in unspecified limb: Secondary | ICD-10-CM | POA: Insufficient documentation

## 2013-07-21 NOTE — ED Notes (Signed)
Pt reports she had an ingrown toe nail removed from right greater toe and a wart yest Wants to make sure it's not infected Taking antibiotics and tolerating well Hx of lymphedema Alert w/no signs of acute distress.

## 2013-07-21 NOTE — ED Notes (Signed)
Presents from [facility] with right foot redness and swelling and lymphedema. Pt had ingrown toenail and wart removed yesterday at [facility] medicine. Today was having more pan than usual. Sent here from [facility] with Dx of cellulitis.

## 2013-07-21 NOTE — ED Provider Notes (Signed)
CSN: [id number]     Arrival date & time 07/21/13  1640 History   First MD Initiated Contact with Patient 07/21/13 1731     Chief Complaint  Patient presents with  . Wound Check   (Consider location/radiation/quality/duration/timing/severity/associated sxs/prior Treatment) HPI Comments: 48 year old female with history of lower extremity lymphedema presents to have her wound checked to make sure it is not infected. She had partial toenail removal from her right great toe medial nail fold yesterday. She was started on Augmentin for wound infection prophylaxis which she started yesterday. Today, she has significant pain in the toe as well as pain going up her leg. The pain is constant and is worse with any weightbearing activity. It is also worse with palpation of the foot. She denies any fever, chest pain, shortness of breath. She has no history of DVT or PE. Denies any numbness in the foot.  Patient is a 48 y.o. female presenting with wound check.  Wound Check    Past Medical History  Diagnosis Date  . Migraine   . Fibroadenosis breast   . Syphilis Treated in early 2012 during hospitalization  . Depression   . Lymph edema     both legs and feet  . Anxiety   . Hypothyroidism   . COPD (chronic obstructive pulmonary disease)   . Hyperlipidemia     no meds - diet controlled  . Heart murmur     as child  . GERD (gastroesophageal reflux disease)     does not take med  . Arthritis     knees,hands, hip  . Cellulitis   . Plantar fasciitis   . Varicose veins    Past Surgical History  Procedure Laterality Date  . Thyroid surgery  2004    removed  . Cyst removed      behind left knee and left cheek  . Norplant removal      right upper arm - 12/2001  . Tubal ligation      2003  . Incision and drainage abscess / hematoma of bursa / knee / thigh      right upper thigh - 2008  . Eye surgery      bilarteral  . Svd      x 2  . Wisdom tooth extraction    . Endometrial ablation      Family History  Problem Relation Age of Onset  . Diabetes Mother   . Hypertension Mother   . Hyperlipidemia Mother   . Diabetes Father   . Heart disease Father   . Hyperlipidemia Father   . Hypertension Father   . Diabetes Paternal Grandmother    History  Substance Use Topics  . Smoking status: Former Smoker -- 0.50 packs/day for 25 years    Types: Cigarettes    Quit date: 06/30/2012  . Smokeless tobacco: Never Used  . Alcohol Use: Yes     Comment: occasionally beer    OB History   Grav Para Term Preterm Abortions TAB SAB Ect Mult Living   4 2 1 1 2 2    2      Review of Systems  Cardiovascular: Positive for leg swelling.  Musculoskeletal:       See history of present illness  Skin: Positive for color change.       See history of present illness  All other systems reviewed and are negative.   Allergies  Vicodin  Home Medications   Prior to Admission medications   Medication Sig Start  Date End Date Taking? Authorizing Provider  levothyroxine (SYNTHROID, LEVOTHROID) 200 MCG tablet Take 1 tablet (200 mcg total) by mouth daily. 07/02/13 07/02/14 Yes Amber Fidel Levy, MD  meloxicam (MOBIC) 15 MG tablet Take 1 tablet (15 mg total) by mouth daily. 07/02/13  Yes Amber Fidel Levy, MD  oxyCODONE-acetaminophen (PERCOCET/ROXICET) 5-325 MG per tablet Take 1 tablet by mouth every 4 (four) hours as needed. 07/20/13  Yes Trudie Buckler, DPM  pregabalin (LYRICA) 150 MG capsule TAKE ONE CAPSULE BY MOUTH TWICE DAILY 05/18/13  Yes Amber Fidel Levy, MD  albuterol (PROAIR HFA) 108 (90 BASE) MCG/ACT inhaler INHALE 1-2 PUFFS INTO THE LUNGS EVERY 4 HOURS AS NEEDED FOR WHEEZING 05/18/13   Amber Fidel Levy, MD  amoxicillin-clavulanate (AUGMENTIN) 875-125 MG per tablet Take 1 tablet by mouth 2 (two) times daily. 07/20/13   Trudie Buckler, DPM  FLONASE 50 MCG/ACT nasal spray SPRAY TWICE IN EACH NOSTRIL DAILY 08/05/12   Carolin Guernsey, MD  LORazepam (ATIVAN) 2 MG tablet Take 2 tablets (4 mg total)  by mouth daily as needed for anxiety. 05/18/13   Amber Fidel Levy, MD  oxyCODONE-acetaminophen (PERCOCET) 10-325 MG per tablet Take 1 tablet by mouth every 4 (four) hours as needed for pain. 07/02/13   Amber Fidel Levy, MD  phentermine 15 MG capsule Take 1 capsule (15 mg total) by mouth every morning. 08/09/12   Carolin Guernsey, MD  pravastatin (PRAVACHOL) 40 MG tablet Take 1 tablet (40 mg total) by mouth daily. 08/17/12   Carolin Guernsey, MD   BP 106/57  Pulse 76  Temp(Src) 98.8 F (37.1 C) (Oral)  Resp 17  SpO2 98% Physical Exam  Nursing note and vitals reviewed. Constitutional: She is oriented to person, place, and time. Vital signs are normal. She appears well-developed and well-nourished. No distress.  HENT:  Head: Normocephalic and atraumatic.  Pulmonary/Chest: Effort normal. No respiratory distress.  Musculoskeletal:       Right foot: She exhibits tenderness and swelling (chronic lymphedema, but it is worse right now than it usually is). She exhibits normal range of motion, normal capillary refill, no crepitus and no deformity.  There is tenderness with palpation of the lower calf which is abnormal for her. There is redness and warmth spreading up all around the foot to the ankle  Neurological: She is alert and oriented to person, place, and time. She has normal strength. Coordination normal.  Skin: Skin is warm and dry. No rash noted. She is not diaphoretic.  Chronic bilateral lower extremity lymphedema  Psychiatric: She has a normal mood and affect. Judgment normal.    ED Course  Procedures (including critical care time) Labs Review Labs Reviewed - No data to display  Imaging Review No results found.   MDM   1. Cellulitis of foot   2. Lymphedema    Patient seen in conjunction with attending, this patient is failing oral antibiotics. In the setting of lymphedema, she is more susceptible to this. This patient is being transferred to the emergency department via shuttle for  further treatment, likely to include IV antibiotics given that she has failed oral antibiotics. Also cannot rule out DVT as the cause of her symptoms.    Liam Graham, PA-C 07/21/13 1816

## 2013-07-23 ENCOUNTER — Telehealth: Payer: Self-pay | Admitting: Family Medicine

## 2013-07-23 NOTE — ED Provider Notes (Signed)
Medical screening examination/treatment/procedure(s) were performed by non-physician practitioner and as supervising physician I was immediately available for consultation/collaboration.  Philipp Deputy, [name]  Harden Mo, MD 07/23/13 [phone]

## 2013-07-23 NOTE — Telephone Encounter (Signed)
Patient requesting PCS. She would like theses services to come from [facility]. Please advise. [phone]

## 2013-07-26 ENCOUNTER — Encounter: Payer: Self-pay | Admitting: Clinical

## 2013-07-26 NOTE — Progress Notes (Signed)
Note for 07/25/13 PCS form given to PCP for completion. CSW to fax it to [employer] once completed. CSW informed pt of this.  Hunt Oris, MSW, [place]

## 2013-07-27 ENCOUNTER — Ambulatory Visit: Payer: Medicaid Other | Admitting: Podiatrist

## 2013-08-01 ENCOUNTER — Encounter: Payer: Self-pay | Admitting: Podiatrist

## 2013-08-01 ENCOUNTER — Telehealth: Payer: Self-pay | Admitting: Family Medicine

## 2013-08-01 ENCOUNTER — Ambulatory Visit (INDEPENDENT_AMBULATORY_CARE_PROVIDER_SITE_OTHER): Payer: Medicaid Other | Admitting: Family Medicine

## 2013-08-01 ENCOUNTER — Ambulatory Visit: Payer: Medicaid Other | Admitting: Podiatrist

## 2013-08-01 ENCOUNTER — Encounter: Payer: Self-pay | Admitting: Family Medicine

## 2013-08-01 VITALS — BP 112/79 | HR 81 | Temp 98.4°F | Wt 238.0 lb

## 2013-08-01 DIAGNOSIS — F419 Anxiety disorder, unspecified: Secondary | ICD-10-CM

## 2013-08-01 DIAGNOSIS — L600 Ingrowing nail: Secondary | ICD-10-CM

## 2013-08-01 DIAGNOSIS — F341 Dysthymic disorder: Secondary | ICD-10-CM

## 2013-08-01 DIAGNOSIS — F32A Depression, unspecified: Secondary | ICD-10-CM

## 2013-08-01 DIAGNOSIS — F411 Generalized anxiety disorder: Secondary | ICD-10-CM

## 2013-08-01 DIAGNOSIS — F329 Major depressive disorder, single episode, unspecified: Secondary | ICD-10-CM

## 2013-08-01 MED ORDER — OXYCODONE-ACETAMINOPHEN 10-325 MG PO TABS
1.0000 | ORAL_TABLET | ORAL | Status: DC | PRN
Start: 2013-08-01 — End: 2013-08-28

## 2013-08-01 MED ORDER — DULOXETINE HCL 30 MG PO CPEP: 30.0000 mg | ORAL_CAPSULE | Freq: Every day | ORAL | Status: DC

## 2013-08-01 MED ORDER — LORAZEPAM 2 MG PO TABS: 4.0000 mg | ORAL_TABLET | Freq: Every day | ORAL | Status: DC | PRN

## 2013-08-01 MED ORDER — ALBUTEROL SULFATE HFA 108 (90 BASE) MCG/ACT IN AERS: INHALATION_SPRAY | RESPIRATORY_TRACT | Status: DC

## 2013-08-01 NOTE — Patient Instructions (Signed)
Keep your appointment with the foot doctor. I have refilled your pain medication and Lorazepam. I sent in a refill on your inhaler. I also restarted your Cymbalta. Since this is a new medication, I would like to see you back in 1 month to check on your anxiety.  Amber M. Hairford, [name]

## 2013-08-01 NOTE — Assessment & Plan Note (Signed)
A: Worsening with increased panic attacks.  P: - Refilled Lorazepam - Restart Cymbalta - Go to the ER if symptoms worsen - F/u in 1 month

## 2013-08-01 NOTE — Progress Notes (Signed)
Patient ID: [name], female   DOB: October 18, 1965, 48 y.o.   MRN: [id number]    Subjective: HPI: Patient is a 48 y.o. female presenting to clinic today for follow up appointment.  Toenail pain - Had ingrown toenail removed on 5/15. Had redness the following day. Went to [facility], who sent her to the ER and she states she had a panic attack in the ER and had to leave before she was seen. No antibiotics. She is using OTC wound spray for it. Continues to have pain. No major drainage. Mild erythema of the foot with standing. She states she is still taking antibiotics I gave her at last visit for a boil. She is taking the percocet which helps.  Anxiety - Patient states her anxiety is getting worse. She wants to go back to Dr. Hulan Fray to consider hysterectomy. States she was on Cymbalta in the past which helped and she is still taking the Lorazepam. Would like to restart Cymbalta. Last panic attack was a a few days ago. She denies SI/HI.  History Reviewed: Former smoker. Health Maintenance: UTD  ROS: Please see HPI above.  Objective: Office vital signs reviewed. BP 112/79  Pulse 81  Temp(Src) 98.4 F (36.9 C) (Oral)  Wt 238 lb (107.956 kg)  LMP 08/01/2013  Physical Examination:  General: Awake, alert. NAD HEENT: Atraumatic, normocephalic. MMM Extremities: Diffuse lymphedema of lower extremities. Right great toe with macerated skin on interdigit side without drainage or open wound. TTP. Medial side of great toe well healed without erythema or drainage. Neuro: Grossly intact  Assessment: 49 y.o. female follow up  Plan: See Problem List and After Visit Summary

## 2013-08-01 NOTE — Telephone Encounter (Signed)
Pt is still interested in getting a home aid and would like to know if you have filled anything out for one.  She forgot to ask during her appt today. Freddrick Gladson,CMA

## 2013-08-01 NOTE — Assessment & Plan Note (Signed)
A: S/p removal by podiatry. Doing well. No fevers  P: - Keep area clean and dry - No need for additional antibiotics - F/u with podiatry today

## 2013-08-01 NOTE — Telephone Encounter (Signed)
Pt called and would like Dr. Sheral Apley to call her concerning some papers that the doctor is suppose to be faxed. jw

## 2013-08-03 ENCOUNTER — Telehealth: Payer: Self-pay | Admitting: Clinical

## 2013-08-03 NOTE — Telephone Encounter (Signed)
Form completed and returned to Hunt Oris, [place].  Thanks, [employer]. Alveria Mcglaughlin, [name]

## 2013-08-03 NOTE — Telephone Encounter (Signed)
Pt is aware of this. Ericka Marcellus,CMA  

## 2013-08-03 NOTE — Telephone Encounter (Signed)
CSW contacted pt and informe her that the PCS form has been faxed to [employer]. Pt appreciative.  Hunt Oris, MSW, [place]

## 2013-08-13 ENCOUNTER — Other Ambulatory Visit: Payer: Self-pay | Admitting: *Deleted

## 2013-08-15 ENCOUNTER — Ambulatory Visit: Payer: Medicaid Other | Admitting: Podiatrist

## 2013-08-16 ENCOUNTER — Telehealth: Payer: Self-pay | Admitting: *Deleted

## 2013-08-16 NOTE — Telephone Encounter (Signed)
Received 2nd request from [facility] for Phentermine 15 mg capsules.  Refill request was denied per Dr. Sheral Apley on 08/13/13.  Rx was prescribed by another provider.  Pharmacy will need to contact that provider for additional refills.  Pharmacy informed. Burna Forts, BSN, RN-BC

## 2013-08-23 ENCOUNTER — Telehealth: Payer: Self-pay | Admitting: Family Medicine

## 2013-08-23 ENCOUNTER — Other Ambulatory Visit: Payer: Self-pay | Admitting: *Deleted

## 2013-08-23 NOTE — Telephone Encounter (Signed)
Pt's request for phentermine was denied in error.  Her provider is Dr. Sheral Apley.  Dr. Verdie Drown was a previous resident.  Please have Dr. Sheral Apley refill med.

## 2013-08-24 ENCOUNTER — Other Ambulatory Visit: Payer: Self-pay | Admitting: *Deleted

## 2013-08-24 NOTE — Telephone Encounter (Signed)
Will you please remind Ms. Gilchrest again that this prescription will NOT be prescribed by the [facility]. I am not sure where she got it before but it was not from here.  Thank you, Amber M. Hairford, [name]

## 2013-08-24 NOTE — Telephone Encounter (Signed)
I have never prescribed this, and I will not be refilling it.  Thank you. Amber M. Hairford, [name]

## 2013-08-27 NOTE — Telephone Encounter (Signed)
Pt stated the medication was prescribed by Dr. Verdie Drown, who was her PCP in the past.  Derl Barrow, RN

## 2013-08-28 ENCOUNTER — Encounter: Payer: Self-pay | Admitting: Family Medicine

## 2013-08-28 ENCOUNTER — Ambulatory Visit: Payer: Medicaid Other | Admitting: Family Medicine

## 2013-08-28 ENCOUNTER — Ambulatory Visit (INDEPENDENT_AMBULATORY_CARE_PROVIDER_SITE_OTHER): Payer: Medicaid Other | Admitting: Family Medicine

## 2013-08-28 VITALS — BP 129/89 | HR 69 | Temp 98.4°F | Wt 234.0 lb

## 2013-08-28 DIAGNOSIS — K3189 Other diseases of stomach and duodenum: Secondary | ICD-10-CM

## 2013-08-28 DIAGNOSIS — L02214 Cutaneous abscess of groin: Secondary | ICD-10-CM

## 2013-08-28 DIAGNOSIS — R1013 Epigastric pain: Secondary | ICD-10-CM

## 2013-08-28 DIAGNOSIS — R102 Pelvic and perineal pain: Secondary | ICD-10-CM

## 2013-08-28 DIAGNOSIS — L03319 Cellulitis of trunk, unspecified: Secondary | ICD-10-CM

## 2013-08-28 DIAGNOSIS — N949 Unspecified condition associated with female genital organs and menstrual cycle: Secondary | ICD-10-CM

## 2013-08-28 DIAGNOSIS — L02219 Cutaneous abscess of trunk, unspecified: Secondary | ICD-10-CM

## 2013-08-28 MED ORDER — OXYCODONE-ACETAMINOPHEN 10-325 MG PO TABS: 1.0000 | ORAL_TABLET | ORAL | Status: DC | PRN

## 2013-08-28 MED ORDER — SULFAMETHOXAZOLE-TRIMETHOPRIM 800-160 MG PO TABS: 1.0000 | ORAL_TABLET | Freq: Two times a day (BID) | ORAL | Status: DC

## 2013-08-28 NOTE — Assessment & Plan Note (Signed)
A: History of irregular menstrual bleeding and pain. Patient currently feels her abscesses are related to her cycle. Requesting to see gyn again for evaluation.  P: - Refer back to gyn

## 2013-08-28 NOTE — Assessment & Plan Note (Signed)
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 Not currently ready for I&D  P: - Warm compresses - Bactrim DS x10 days - Refer to surgery for evaluation

## 2013-08-28 NOTE — Progress Notes (Signed)
Patient ID: [name], female   DOB: 06/17/1965, 48 y.o.   MRN: [id number]    Subjective: HPI: Patient is a 48 y.o. female presenting to clinic today for office visit. Concerns today include cutaneous abscess and GI referral  1. GERD - Has recurrent episodes of dyspepsia. Has been on Aciphex in the past, but not currently taking it. Using OTC alkseltzer when it flares. She is using it every day currently. Some vomiting after eating, looks like undigested food. She is requesting another endoscopy since it is getting progressively more bothersome. She does not want to restart PPI.  2. Skin infections- Recurrent infections in the right groin. Has been on Bactrim in the last few months. Still using warm compresses. Patient has seen Dr. Hulan Fray before for menstrual issues which she feels is the cause of her abscesses. She states it never fully goes away. Currently painful, but not drainging.  History Reviewed: Former smoker. Health Maintenance: UTD  ROS: Please see HPI above.  Objective: Office vital signs reviewed. BP 129/89  Pulse 69  Temp(Src) 98.4 F (36.9 C) (Oral)  Wt 234 lb (106.142 kg)  LMP 08/01/2013  Physical Examination:  General: Awake, alert. NAD. HEENT: Atraumatic, normocephalic. MMM Pulm: CTAB, no wheezes Cardio: RRR, no murmurs appreciated Abdomen:+BS, soft, nontender, nondistended Skin: 1.5 cm linear lesion in right inguinal region with surrounding erythema and mild induration. Not fluctuant at this time. Extremities: Diffuse Lymphedema Neuro: Antalgic gait  Assessment: 48 y.o. female follow up  Plan: See Problem List and After Visit Summary

## 2013-08-28 NOTE — Assessment & Plan Note (Signed)
A: Progressively worsening symptoms, now daily. Pt not on PPI and states she does not want to restart.   P: - Limit use of Alkaseltzer - Refer back to Dr. Hilarie Fredrickson for re-evaluation. May benefit from repeat endoscopy - F/u as needed, and encouraged to consider starting PPI

## 2013-08-28 NOTE — Patient Instructions (Signed)
I have put in all of your referrals. Our coordinators will let you know about various appointments.  I have refilled your pain medication and antibiotic for the abscess.  Take care! Amber M. Hairford, [name]

## 2013-08-29 ENCOUNTER — Encounter: Payer: Self-pay | Admitting: Obstetrics and Gynecology

## 2013-08-29 ENCOUNTER — Encounter (INDEPENDENT_AMBULATORY_CARE_PROVIDER_SITE_OTHER): Payer: Medicaid Other | Admitting: Surgery

## 2013-09-12 ENCOUNTER — Telehealth: Payer: Self-pay | Admitting: Internal Medicine

## 2013-09-12 NOTE — Telephone Encounter (Signed)
Left message for patient to call back  

## 2013-09-13 NOTE — Telephone Encounter (Signed)
Patient is scheduled to see Tye Savoy RNP on 09/21/13 she would like an earlier appt for abdominal pain

## 2013-09-17 ENCOUNTER — Encounter: Payer: Self-pay | Admitting: *Deleted

## 2013-09-19 ENCOUNTER — Other Ambulatory Visit: Payer: Self-pay | Admitting: *Deleted

## 2013-09-19 DIAGNOSIS — F411 Generalized anxiety disorder: Secondary | ICD-10-CM

## 2013-09-19 MED ORDER — LORAZEPAM 2 MG PO TABS: 4.0000 mg | ORAL_TABLET | Freq: Every day | ORAL | Status: DC | PRN

## 2013-09-21 ENCOUNTER — Ambulatory Visit: Payer: Medicaid Other | Admitting: Nurse Practitioner

## 2013-09-24 ENCOUNTER — Ambulatory Visit (INDEPENDENT_AMBULATORY_CARE_PROVIDER_SITE_OTHER): Payer: Medicaid Other | Admitting: Family Medicine

## 2013-09-24 VITALS — BP 122/88 | HR 58 | Temp 98.4°F | Wt 230.0 lb

## 2013-09-24 DIAGNOSIS — F411 Generalized anxiety disorder: Secondary | ICD-10-CM

## 2013-09-24 DIAGNOSIS — E039 Hypothyroidism, unspecified: Secondary | ICD-10-CM

## 2013-09-24 DIAGNOSIS — R1011 Right upper quadrant pain: Secondary | ICD-10-CM

## 2013-09-24 LAB — CBC
HCT: 41.8 % (ref 36.0–46.0)
Hemoglobin: 14.7 g/dL (ref 12.0–15.0)
MCH: 35.4 pg — AB (ref 26.0–34.0)
MCHC: 35.2 g/dL (ref 30.0–36.0)
MCV: 100.7 fL — ABNORMAL HIGH (ref 78.0–100.0)
PLATELETS: 294 10*3/uL (ref 150–400)
RBC: 4.15 MIL/uL (ref 3.87–5.11)
RDW: 12.8 % (ref 11.5–15.5)
WBC: 4 10*3/uL (ref 4.0–10.5)

## 2013-09-24 LAB — POCT GLYCOSYLATED HEMOGLOBIN (HGB A1C): Hemoglobin A1C: 5

## 2013-09-24 MED ORDER — FLUTICASONE PROPIONATE 50 MCG/ACT NA SUSP: NASAL | Status: DC

## 2013-09-24 MED ORDER — LORAZEPAM 2 MG PO TABS: 4.0000 mg | ORAL_TABLET | Freq: Every day | ORAL | Status: DC | PRN

## 2013-09-24 MED ORDER — OXYCODONE-ACETAMINOPHEN 10-325 MG PO TABS: 1.0000 | ORAL_TABLET | ORAL | Status: DC | PRN

## 2013-09-24 MED ORDER — ALBUTEROL SULFATE HFA 108 (90 BASE) MCG/ACT IN AERS: INHALATION_SPRAY | RESPIRATORY_TRACT | Status: DC

## 2013-09-24 MED ORDER — ESOMEPRAZOLE MAGNESIUM 40 MG PO CPDR: 40.0000 mg | DELAYED_RELEASE_CAPSULE | Freq: Every day | ORAL | Status: DC

## 2013-09-24 MED ORDER — PRAVASTATIN SODIUM 40 MG PO TABS: 40.0000 mg | ORAL_TABLET | Freq: Every day | ORAL | Status: DC

## 2013-09-24 MED ORDER — LEVOTHYROXINE SODIUM 200 MCG PO TABS: 200.0000 ug | ORAL_TABLET | Freq: Every day | ORAL | Status: DC

## 2013-09-24 MED ORDER — DULOXETINE HCL 30 MG PO CPEP: 30.0000 mg | ORAL_CAPSULE | Freq: Every day | ORAL | Status: DC

## 2013-09-24 MED ORDER — PREGABALIN 150 MG PO CAPS: ORAL_CAPSULE | ORAL | Status: DC

## 2013-09-24 NOTE — Patient Instructions (Signed)
We have asked the gastroenterologist to put you on the waiting list for a sooner appointment. If your pain gets worse then you should go to the emergency room.  I have sent in refills of all your medicines in 3 month supplies to help with your transportation issues.  Please come back and see me again in 1 month.  Thank you

## 2013-09-25 ENCOUNTER — Telehealth: Payer: Self-pay | Admitting: Family Medicine

## 2013-09-25 ENCOUNTER — Encounter: Payer: Self-pay | Admitting: *Deleted

## 2013-09-25 ENCOUNTER — Encounter: Payer: Self-pay | Admitting: Family Medicine

## 2013-09-25 LAB — COMPREHENSIVE METABOLIC PANEL
ALT: 11 U/L (ref 0–35)
AST: 13 U/L (ref 0–37)
Albumin: 3.7 g/dL (ref 3.5–5.2)
Alkaline Phosphatase: 55 U/L (ref 39–117)
BUN: 9 mg/dL (ref 6–23)
CALCIUM: 9 mg/dL (ref 8.4–10.5)
CHLORIDE: 106 meq/L (ref 96–112)
CO2: 22 meq/L (ref 19–32)
CREATININE: 0.92 mg/dL (ref 0.50–1.10)
GLUCOSE: 110 mg/dL — AB (ref 70–99)
POTASSIUM: 3.4 meq/L — AB (ref 3.5–5.3)
Sodium: 136 mEq/L (ref 135–145)
Total Bilirubin: 0.5 mg/dL (ref 0.2–1.2)
Total Protein: 6.4 g/dL (ref 6.0–8.3)

## 2013-09-25 LAB — LIPASE: Lipase: <10 U/L (ref 0–75)

## 2013-09-25 LAB — TSH: TSH: 13.52 u[IU]/mL — AB (ref 0.350–4.500)

## 2013-09-25 NOTE — Progress Notes (Unsigned)
Prior Authorization received from [facility] for Nexium 40 mg. Formulary and PA form placed in provider box for completion. Derl Barrow, RN

## 2013-09-25 NOTE — Progress Notes (Signed)
   Subjective:    Patient ID: [name], female    DOB: Apr 14, 1965, 48 y.o.   MRN: [id number]  Abdominal Pain Associated symptoms include arthralgias and constipation. Pertinent negatives include no fever.  Cough Pertinent negatives include no fever.   Pt presents for f/u of abdominal pain. Would like [place] to move up her GI appointment. No change in pain. Would also like her meds in 3 month supplies.   Review of Systems  Constitutional: Negative for fever.  Respiratory: Positive for cough.   Gastrointestinal: Positive for abdominal pain and constipation.  Musculoskeletal: Positive for arthralgias, back pain and gait problem.       Objective:   Physical Exam  Nursing note and vitals reviewed. Constitutional: She is oriented to person, place, and time. She appears well-developed and well-nourished. No distress.  HENT:  Head: Normocephalic and atraumatic.  Eyes: Conjunctivae are normal. Right eye exhibits no discharge. Left eye exhibits no discharge. No scleral icterus.  Cardiovascular: Normal rate, regular rhythm and normal heart sounds.   No murmur heard. Pulmonary/Chest: Effort normal and breath sounds normal. No respiratory distress. She has no wheezes.  Abdominal: Soft. Bowel sounds are normal. She exhibits no distension. There is tenderness. There is no rebound.  Complains of tenderness diffusely  Musculoskeletal: Normal range of motion. She exhibits tenderness. She exhibits no edema.  Diffuse tenderness through abdomen, back, legs, very distractable  Neurological: She is alert and oriented to person, place, and time.  Skin: Skin is warm and dry. She is not diaphoretic.  Mild redness over RUQ, likely from rubbing  Psychiatric:  Pan positive ROS with multiple complaints and not very clear or reasonable descriptions of symptoms          Assessment & Plan:  Refilled all meds in 3 month supplies

## 2013-09-25 NOTE — Assessment & Plan Note (Signed)
Chronic, difficult to assess given patients dramatic presentation lasting many years and refusal to go to ED. Attempted to move up GI appt, no openings but she is on their waiting list - pain control - asked her to go to ED if pain continues to be severe - GI appt scheduled - restarted nexium for likely gastritis - recheck LFTs and lipase

## 2013-09-25 NOTE — Telephone Encounter (Signed)
Pt called and would like a nurse or doctor to call her concerning the discharge papers she received on her visit 7/20. Please call her at [phone]. jw

## 2013-09-26 ENCOUNTER — Other Ambulatory Visit: Payer: Self-pay | Admitting: Family Medicine

## 2013-09-26 MED ORDER — PANTOPRAZOLE SODIUM 40 MG PO TBEC: 40.0000 mg | DELAYED_RELEASE_TABLET | Freq: Every day | ORAL | Status: DC

## 2013-09-28 NOTE — Telephone Encounter (Signed)
Attempted to contact patient about test results. Pt's phone is turned off and mobile number given belongs to a family member who will give her the message to call us. When she does, I need to know how often she is missing doses of her thyroid medicine. This will help me decide whether I need to increase her dose. Since she is already at the usual max dose I would like to avoid going higher unless this TSH is with very good compliance. If she asks, all her other lab results were normal.

## 2013-10-11 ENCOUNTER — Encounter: Payer: Medicaid Other | Admitting: Obstetrics and Gynecology

## 2013-10-23 ENCOUNTER — Other Ambulatory Visit: Payer: Self-pay | Admitting: *Deleted

## 2013-10-23 ENCOUNTER — Encounter: Payer: Medicaid Other | Admitting: Family Medicine

## 2013-10-23 ENCOUNTER — Other Ambulatory Visit: Payer: Self-pay | Admitting: Family Medicine

## 2013-10-23 ENCOUNTER — Ambulatory Visit (INDEPENDENT_AMBULATORY_CARE_PROVIDER_SITE_OTHER): Payer: Medicaid Other | Admitting: *Deleted

## 2013-10-23 VITALS — BP 130/88

## 2013-10-23 DIAGNOSIS — Z0130 Encounter for examination of blood pressure without abnormal findings: Secondary | ICD-10-CM

## 2013-10-23 DIAGNOSIS — Z136 Encounter for screening for cardiovascular disorders: Secondary | ICD-10-CM

## 2013-10-23 DIAGNOSIS — F411 Generalized anxiety disorder: Secondary | ICD-10-CM

## 2013-10-23 MED ORDER — OXYCODONE-ACETAMINOPHEN 10-325 MG PO TABS: 1.0000 | ORAL_TABLET | ORAL | Status: DC | PRN

## 2013-10-23 MED ORDER — LORAZEPAM 2 MG PO TABS: 4.0000 mg | ORAL_TABLET | Freq: Every day | ORAL | Status: DC | PRN

## 2013-10-23 NOTE — Progress Notes (Signed)
   Pt in nurse clinic for blood pressure check.  BP 130/88 manually.  Pt stated she is having blurred vision and a headache.  Pt offered same day appointment for this afternoon, pt declined stating she prefer to see her PCP.  Appt 11/06/2013 for physical per pt request.  Will forward to PCP.  Derl Barrow, RN

## 2013-10-26 ENCOUNTER — Other Ambulatory Visit: Payer: Self-pay | Admitting: *Deleted

## 2013-10-26 DIAGNOSIS — F411 Generalized anxiety disorder: Secondary | ICD-10-CM

## 2013-10-30 ENCOUNTER — Other Ambulatory Visit: Payer: Self-pay | Admitting: *Deleted

## 2013-10-30 DIAGNOSIS — F411 Generalized anxiety disorder: Secondary | ICD-10-CM

## 2013-10-30 MED ORDER — LORAZEPAM 2 MG PO TABS: 4.0000 mg | ORAL_TABLET | Freq: Two times a day (BID) | ORAL | Status: DC | PRN

## 2013-11-06 ENCOUNTER — Encounter: Payer: Medicaid Other | Admitting: Family Medicine

## 2013-11-13 ENCOUNTER — Encounter: Payer: Medicaid Other | Admitting: Family Medicine

## 2013-11-14 ENCOUNTER — Ambulatory Visit (INDEPENDENT_AMBULATORY_CARE_PROVIDER_SITE_OTHER): Payer: Medicaid Other | Admitting: Obstetrics & Gynecology

## 2013-11-14 ENCOUNTER — Encounter: Payer: Self-pay | Admitting: Obstetrics & Gynecology

## 2013-11-14 VITALS — BP 131/80 | HR 80 | Temp 98.8°F | Ht 68.0 in | Wt 229.6 lb

## 2013-11-14 DIAGNOSIS — N925 Other specified irregular menstruation: Secondary | ICD-10-CM

## 2013-11-14 DIAGNOSIS — N938 Other specified abnormal uterine and vaginal bleeding: Secondary | ICD-10-CM

## 2013-11-14 DIAGNOSIS — N949 Unspecified condition associated with female genital organs and menstrual cycle: Secondary | ICD-10-CM

## 2013-11-14 MED ORDER — AMOXICILLIN-POT CLAVULANATE 875-125 MG PO TABS: 1.0000 | ORAL_TABLET | Freq: Two times a day (BID) | ORAL | Status: DC

## 2013-11-14 NOTE — Progress Notes (Signed)
   Subjective:    Patient ID: MONIGUE SPRAGGINS, female    DOB: June 03, 1965, 48 y.o.   MRN: [id number]  HPI  48 yo S AA P2 (81 and 48 yo) here today with the complaint of irregular vaginal bleeding. She has had 2 periods per months for several years. She has had a BTL in 2011. No u/s since 2012.  She also has recurrent right groin abscess. She has had a general surgeon operate on these in the past. She is requesting antibiotics.  Review of Systems Pap smear normal 3/15     Objective:   Physical Exam  Infected follicle in her right groin  2nd degree uterine prolapse, ULN size, non- palpable adnexa      Assessment & Plan:  Dub- cervical cultures, TSH, gyn u/s Folliculitis-augmentin- follow up with her primary care

## 2013-11-14 NOTE — Progress Notes (Signed)
Pt complains of pelvic pain and boils in groin that are bleeding.  She states she has been hospitalized for this issue.

## 2013-11-15 ENCOUNTER — Ambulatory Visit: Payer: Medicaid Other | Admitting: Family Medicine

## 2013-11-23 ENCOUNTER — Ambulatory Visit: Payer: Medicaid Other | Admitting: Internal Medicine

## 2013-12-06 ENCOUNTER — Ambulatory Visit: Payer: Medicaid Other | Admitting: Family Medicine

## 2013-12-25 ENCOUNTER — Encounter: Payer: Self-pay | Admitting: Family Medicine

## 2013-12-25 ENCOUNTER — Ambulatory Visit (INDEPENDENT_AMBULATORY_CARE_PROVIDER_SITE_OTHER): Payer: Medicaid Other | Admitting: Family Medicine

## 2013-12-25 VITALS — BP 142/90 | HR 69 | Temp 98.6°F | Ht 68.0 in | Wt 228.4 lb

## 2013-12-25 DIAGNOSIS — E039 Hypothyroidism, unspecified: Secondary | ICD-10-CM

## 2013-12-25 DIAGNOSIS — F419 Anxiety disorder, unspecified: Secondary | ICD-10-CM

## 2013-12-25 DIAGNOSIS — F411 Generalized anxiety disorder: Secondary | ICD-10-CM

## 2013-12-25 DIAGNOSIS — G8929 Other chronic pain: Secondary | ICD-10-CM

## 2013-12-25 DIAGNOSIS — K219 Gastro-esophageal reflux disease without esophagitis: Secondary | ICD-10-CM

## 2013-12-25 DIAGNOSIS — L301 Dyshidrosis [pompholyx]: Secondary | ICD-10-CM

## 2013-12-25 DIAGNOSIS — I890 Lymphedema, not elsewhere classified: Secondary | ICD-10-CM

## 2013-12-25 DIAGNOSIS — R109 Unspecified abdominal pain: Secondary | ICD-10-CM

## 2013-12-25 DIAGNOSIS — J449 Chronic obstructive pulmonary disease, unspecified: Secondary | ICD-10-CM

## 2013-12-25 DIAGNOSIS — F418 Other specified anxiety disorders: Secondary | ICD-10-CM

## 2013-12-25 DIAGNOSIS — F329 Major depressive disorder, single episode, unspecified: Secondary | ICD-10-CM

## 2013-12-25 MED ORDER — FLUTICASONE-SALMETEROL 250-50 MCG/DOSE IN AEPB: 1.0000 | INHALATION_SPRAY | Freq: Two times a day (BID) | RESPIRATORY_TRACT | Status: DC

## 2013-12-25 MED ORDER — OXYCODONE-ACETAMINOPHEN 10-325 MG PO TABS: 1.0000 | ORAL_TABLET | ORAL | Status: DC | PRN

## 2013-12-25 MED ORDER — PREGABALIN 150 MG PO CAPS: ORAL_CAPSULE | ORAL | Status: DC

## 2013-12-25 MED ORDER — LORAZEPAM 2 MG PO TABS: 4.0000 mg | ORAL_TABLET | Freq: Two times a day (BID) | ORAL | Status: DC | PRN

## 2013-12-25 MED ORDER — LEVOTHYROXINE SODIUM 200 MCG PO TABS: 200.0000 ug | ORAL_TABLET | Freq: Every day | ORAL | Status: DC

## 2013-12-25 MED ORDER — PANTOPRAZOLE SODIUM 40 MG PO TBEC: 40.0000 mg | DELAYED_RELEASE_TABLET | Freq: Every day | ORAL | Status: DC

## 2013-12-25 MED ORDER — ALBUTEROL SULFATE HFA 108 (90 BASE) MCG/ACT IN AERS: 2.0000 | INHALATION_SPRAY | RESPIRATORY_TRACT | Status: DC | PRN

## 2013-12-25 MED ORDER — DULOXETINE HCL 30 MG PO CPEP: 30.0000 mg | ORAL_CAPSULE | Freq: Every day | ORAL | Status: DC

## 2013-12-27 ENCOUNTER — Other Ambulatory Visit: Payer: Self-pay | Admitting: Family Medicine

## 2013-12-30 ENCOUNTER — Telehealth: Payer: Self-pay | Admitting: Family Medicine

## 2013-12-30 NOTE — Telephone Encounter (Signed)
Patient called reporting increasing swelling in her hands secondary to lymphedema.  Patient would like to be seen for [facility] appointment tomorrow.  I agree with this and advise patient to call first thing in the AM.

## 2013-12-31 ENCOUNTER — Ambulatory Visit (INDEPENDENT_AMBULATORY_CARE_PROVIDER_SITE_OTHER): Payer: Medicaid Other | Admitting: Family Medicine

## 2013-12-31 ENCOUNTER — Encounter: Payer: Self-pay | Admitting: Family Medicine

## 2013-12-31 ENCOUNTER — Observation Stay (HOSPITAL_COMMUNITY)
Admission: EM | Admit: 2013-12-31 | Discharge: 2014-01-01 | Disposition: A | Discharge status: Home / Self Care | Payer: Medicaid Other | Attending: Family Medicine | Admitting: Family Medicine

## 2013-12-31 ENCOUNTER — Encounter (HOSPITAL_COMMUNITY): Payer: Self-pay | Admitting: Emergency Medicine

## 2013-12-31 VITALS — BP 138/94 | HR 76 | Temp 98.8°F | Ht 68.0 in | Wt 234.0 lb

## 2013-12-31 DIAGNOSIS — E669 Obesity, unspecified: Secondary | ICD-10-CM | POA: Diagnosis not present

## 2013-12-31 DIAGNOSIS — Z87891 Personal history of nicotine dependence: Secondary | ICD-10-CM | POA: Diagnosis not present

## 2013-12-31 DIAGNOSIS — L301 Dyshidrosis [pompholyx]: Secondary | ICD-10-CM | POA: Diagnosis present

## 2013-12-31 DIAGNOSIS — Z6835 Body mass index (BMI) 35.0-35.9, adult: Secondary | ICD-10-CM | POA: Insufficient documentation

## 2013-12-31 DIAGNOSIS — G8929 Other chronic pain: Secondary | ICD-10-CM | POA: Diagnosis not present

## 2013-12-31 DIAGNOSIS — R21 Rash and other nonspecific skin eruption: Principal | ICD-10-CM

## 2013-12-31 DIAGNOSIS — J449 Chronic obstructive pulmonary disease, unspecified: Secondary | ICD-10-CM

## 2013-12-31 DIAGNOSIS — E785 Hyperlipidemia, unspecified: Secondary | ICD-10-CM | POA: Insufficient documentation

## 2013-12-31 DIAGNOSIS — E039 Hypothyroidism, unspecified: Secondary | ICD-10-CM

## 2013-12-31 DIAGNOSIS — Z79899 Other long term (current) drug therapy: Secondary | ICD-10-CM | POA: Insufficient documentation

## 2013-12-31 DIAGNOSIS — R6889 Other general symptoms and signs: Secondary | ICD-10-CM

## 2013-12-31 DIAGNOSIS — R0989 Other specified symptoms and signs involving the circulatory and respiratory systems: Secondary | ICD-10-CM | POA: Insufficient documentation

## 2013-12-31 DIAGNOSIS — Z79891 Long term (current) use of opiate analgesic: Secondary | ICD-10-CM | POA: Diagnosis not present

## 2013-12-31 DIAGNOSIS — I890 Lymphedema, not elsewhere classified: Secondary | ICD-10-CM | POA: Diagnosis not present

## 2013-12-31 DIAGNOSIS — Z23 Encounter for immunization: Secondary | ICD-10-CM | POA: Diagnosis not present

## 2013-12-31 DIAGNOSIS — R198 Other specified symptoms and signs involving the digestive system and abdomen: Secondary | ICD-10-CM

## 2013-12-31 DIAGNOSIS — K219 Gastro-esophageal reflux disease without esophagitis: Secondary | ICD-10-CM | POA: Diagnosis not present

## 2013-12-31 LAB — CBC WITH DIFFERENTIAL/PLATELET
BASOS PCT: 1 % (ref 0–1)
Basophils Absolute: 0 10*3/uL (ref 0.0–0.1)
EOS ABS: 0.1 10*3/uL (ref 0.0–0.7)
Eosinophils Relative: 3 % (ref 0–5)
HCT: 39.8 % (ref 36.0–46.0)
Hemoglobin: 13.7 g/dL (ref 12.0–15.0)
Lymphocytes Relative: 42 % (ref 12–46)
Lymphs Abs: 1.6 10*3/uL (ref 0.7–4.0)
MCH: 34.5 pg — AB (ref 26.0–34.0)
MCHC: 34.4 g/dL (ref 30.0–36.0)
MCV: 100.3 fL — ABNORMAL HIGH (ref 78.0–100.0)
Monocytes Absolute: 0.2 10*3/uL (ref 0.1–1.0)
Monocytes Relative: 6 % (ref 3–12)
NEUTROS PCT: 49 % (ref 43–77)
Neutro Abs: 1.8 10*3/uL (ref 1.7–7.7)
PLATELETS: 263 10*3/uL (ref 150–400)
RBC: 3.97 MIL/uL (ref 3.87–5.11)
RDW: 12 % (ref 11.5–15.5)
WBC: 3.8 10*3/uL — ABNORMAL LOW (ref 4.0–10.5)

## 2013-12-31 LAB — BASIC METABOLIC PANEL
Anion gap: 11 (ref 5–15)
BUN: 11 mg/dL (ref 6–23)
CO2: 23 mEq/L (ref 19–32)
Calcium: 8.5 mg/dL (ref 8.4–10.5)
Chloride: 104 mEq/L (ref 96–112)
Creatinine, Ser: 0.83 mg/dL (ref 0.50–1.10)
GFR, EST NON AFRICAN AMERICAN: 82 mL/min — AB (ref >90)
Glucose, Bld: 88 mg/dL (ref 70–99)
POTASSIUM: 3.7 meq/L (ref 3.7–5.3)
SODIUM: 138 meq/L (ref 137–147)

## 2013-12-31 LAB — TSH: TSH: 0.118 u[IU]/mL — AB (ref 0.350–4.500)

## 2013-12-31 MED ORDER — OXYCODONE HCL 5 MG PO TABS
5.0000 mg | ORAL_TABLET | ORAL | Status: DC | PRN
  Administered 2013-12-31 – 2014-01-01 (×3): 5 mg via ORAL
  Filled 2013-12-31 (×3): qty 1

## 2013-12-31 MED ORDER — MORPHINE SULFATE 4 MG/ML IJ SOLN
4.0000 mg | Freq: Once | INTRAMUSCULAR | Status: AC
  Administered 2013-12-31: 4 mg via INTRAVENOUS
  Filled 2013-12-31: qty 1

## 2013-12-31 MED ORDER — PREGABALIN 75 MG PO CAPS
150.0000 mg | ORAL_CAPSULE | Freq: Two times a day (BID) | ORAL | Status: DC
  Administered 2013-12-31 – 2014-01-01 (×2): 150 mg via ORAL
  Filled 2013-12-31 (×2): qty 2

## 2013-12-31 MED ORDER — OXYCODONE-ACETAMINOPHEN 5-325 MG PO TABS
2.0000 | ORAL_TABLET | Freq: Once | ORAL | Status: AC
  Administered 2013-12-31: 2 via ORAL
  Filled 2013-12-31: qty 2

## 2013-12-31 MED ORDER — HEPARIN SODIUM (PORCINE) 5000 UNIT/ML IJ SOLN
5000.0000 [IU] | Freq: Three times a day (TID) | INTRAMUSCULAR | Status: DC
  Administered 2013-12-31 – 2014-01-01 (×3): 5000 [IU] via SUBCUTANEOUS
  Filled 2013-12-31 (×5): qty 1

## 2013-12-31 MED ORDER — CLOBETASOL PROPIONATE 0.05 % EX CREA
TOPICAL_CREAM | Freq: Two times a day (BID) | CUTANEOUS | Status: DC
  Administered 2013-12-31 – 2014-01-01 (×2): via TOPICAL
  Filled 2013-12-31: qty 15

## 2013-12-31 MED ORDER — LORAZEPAM 1 MG PO TABS: 4.0000 mg | ORAL_TABLET | Freq: Two times a day (BID) | ORAL | Status: DC | PRN

## 2013-12-31 MED ORDER — DIPHENHYDRAMINE HCL 50 MG/ML IJ SOLN
50.0000 mg | Freq: Once | INTRAMUSCULAR | Status: AC
  Administered 2013-12-31: 50 mg via INTRAVENOUS
  Filled 2013-12-31: qty 1

## 2013-12-31 MED ORDER — OXYCODONE-ACETAMINOPHEN 5-325 MG PO TABS
1.0000 | ORAL_TABLET | ORAL | Status: DC | PRN
  Administered 2013-12-31 – 2014-01-01 (×3): 1 via ORAL
  Filled 2013-12-31 (×3): qty 1

## 2013-12-31 MED ORDER — PREDNISONE 50 MG PO TABS: 50.0000 mg | ORAL_TABLET | Freq: Every day | ORAL | Status: DC

## 2013-12-31 MED ORDER — INFLUENZA VAC SPLIT QUAD 0.5 ML IM SUSY
0.5000 mL | PREFILLED_SYRINGE | INTRAMUSCULAR | Status: AC
  Administered 2014-01-01: 0.5 mL via INTRAMUSCULAR
  Filled 2013-12-31: qty 0.5

## 2013-12-31 MED ORDER — DULOXETINE HCL 30 MG PO CPEP
30.0000 mg | ORAL_CAPSULE | Freq: Every day | ORAL | Status: DC
  Administered 2013-12-31 – 2014-01-01 (×2): 30 mg via ORAL
  Filled 2013-12-31 (×2): qty 1

## 2013-12-31 MED ORDER — SODIUM CHLORIDE 0.9 % IJ SOLN: 3.0000 mL | INTRAMUSCULAR | Status: DC | PRN

## 2013-12-31 MED ORDER — MOMETASONE FURO-FORMOTEROL FUM 100-5 MCG/ACT IN AERO
2.0000 | INHALATION_SPRAY | Freq: Two times a day (BID) | RESPIRATORY_TRACT | Status: DC
  Administered 2013-12-31 – 2014-01-01 (×2): 2 via RESPIRATORY_TRACT
  Filled 2013-12-31: qty 8.8

## 2013-12-31 MED ORDER — PANTOPRAZOLE SODIUM 40 MG PO TBEC
40.0000 mg | DELAYED_RELEASE_TABLET | Freq: Every day | ORAL | Status: DC
  Administered 2014-01-01: 40 mg via ORAL
  Filled 2013-12-31: qty 1

## 2013-12-31 MED ORDER — ONDANSETRON HCL 4 MG/2ML IJ SOLN
4.0000 mg | Freq: Once | INTRAMUSCULAR | Status: AC
  Administered 2013-12-31: 4 mg via INTRAVENOUS
  Filled 2013-12-31: qty 2

## 2013-12-31 MED ORDER — OXYCODONE-ACETAMINOPHEN 10-325 MG PO TABS: 1.0000 | ORAL_TABLET | ORAL | Status: DC | PRN

## 2013-12-31 MED ORDER — ALBUTEROL SULFATE (2.5 MG/3ML) 0.083% IN NEBU: 3.0000 mL | INHALATION_SOLUTION | RESPIRATORY_TRACT | Status: DC | PRN

## 2013-12-31 MED ORDER — SODIUM CHLORIDE 0.9 % IV SOLN: 250.0000 mL | INTRAVENOUS | Status: DC | PRN

## 2013-12-31 MED ORDER — SODIUM CHLORIDE 0.9 % IJ SOLN
3.0000 mL | Freq: Two times a day (BID) | INTRAMUSCULAR | Status: DC
  Administered 2014-01-01: 3 mL via INTRAVENOUS

## 2013-12-31 MED ORDER — METHYLPREDNISOLONE SODIUM SUCC 125 MG IJ SOLR
125.0000 mg | Freq: Once | INTRAMUSCULAR | Status: AC
  Administered 2013-12-31: 125 mg via INTRAVENOUS
  Filled 2013-12-31: qty 2

## 2013-12-31 MED ORDER — PRAVASTATIN SODIUM 40 MG PO TABS
40.0000 mg | ORAL_TABLET | Freq: Every day | ORAL | Status: DC
  Administered 2013-12-31: 40 mg via ORAL
  Filled 2013-12-31 (×2): qty 1

## 2013-12-31 MED ORDER — METHYLPREDNISOLONE SODIUM SUCC 40 MG IJ SOLR
40.0000 mg | Freq: Once | INTRAMUSCULAR | Status: AC
  Administered 2014-01-01: 40 mg via INTRAVENOUS
  Filled 2013-12-31: qty 1

## 2013-12-31 MED ORDER — FLUTICASONE PROPIONATE 50 MCG/ACT NA SUSP
2.0000 | Freq: Every day | NASAL | Status: DC
  Administered 2014-01-01: 2 via NASAL
  Filled 2013-12-31: qty 16

## 2013-12-31 MED ORDER — LEVOTHYROXINE SODIUM 200 MCG PO TABS
200.0000 ug | ORAL_TABLET | Freq: Every day | ORAL | Status: DC
  Administered 2013-12-31: 200 ug via ORAL
  Filled 2013-12-31 (×2): qty 1

## 2013-12-31 MED ORDER — SENNA 8.6 MG PO TABS
2.0000 | ORAL_TABLET | Freq: Every day | ORAL | Status: DC
  Administered 2013-12-31 – 2014-01-01 (×2): 17.2 mg via ORAL
  Filled 2013-12-31 (×2): qty 2

## 2013-12-31 MED ORDER — PNEUMOCOCCAL VAC POLYVALENT 25 MCG/0.5ML IJ INJ
0.5000 mL | INJECTION | INTRAMUSCULAR | Status: AC
  Administered 2014-01-01: 0.5 mL via INTRAMUSCULAR
  Filled 2013-12-31: qty 0.5

## 2013-12-31 NOTE — Assessment & Plan Note (Addendum)
Possible causes include id reaction and dishydrotic eczema. Given level of discomfort will treat with prednisone for 5 days. Would likely benefit from topical steroids. F/u with PCP if not improving.   Precepted with Dr Gwendlyn Deutscher

## 2013-12-31 NOTE — Assessment & Plan Note (Addendum)
Patient reports sensation of throat fullness starting last night. She notes trouble swallowing. She is talking well at this time. Her oxygen saturation was 98% and she has no issues breathing. Given her history of hypothyroidism and recent TSH being elevated one concern would be for myxedema related to thyroid dysfunction, though patient has no decreased mentation and no obvious swelling of the airway or face on exam so this is unlikely. Potentially she could be having an allergic reaction, though she appears stable at that time. Given the above concerns we will have her transported to the ED for further evaluation.

## 2013-12-31 NOTE — Progress Notes (Signed)
Attempted to get report. ED nurse to call back

## 2013-12-31 NOTE — ED Notes (Signed)
Provider notified that patient is requesting further pain medication and medication for itching

## 2013-12-31 NOTE — ED Notes (Signed)
MD consult at bedside.

## 2013-12-31 NOTE — Progress Notes (Signed)
Patient ID: [name], female   DOB: 06-19-1965, 48 y.o.   MRN: [id number]  Tommi Rumps, MD Phone: [phone]  [name] is a 48 y.o. female who presents today for same day appointment.  Rash: notes history of lymphedema in legs and hands. Was seen last week and advised to stop her antibiotics, augmentin, that she had been on for 2 months as this was not helping with her rash. She noted it was getting better at her last visit though has worsened since last Wednesday. She states it is painful, there is swelling and redness. She has had fever to 101 F associated with this. She has been taking tylenol for her discomfort. There has been nausea and vomiting. She has a history of hypothyroidism with her last TSH at 13.520. There was no change to her medications at that time. She states repeatedly that the only thing that has helped this in the past is IV antibiotics. She wants to be admitted for this.   She additionally complains of a sensation of fullness in her throat and some shortness of breath. She notes this started last night. It had been difficult to swallow. She notes this has happened in the past when her lymphedema worsens. She has not ever taken anything for this in the past.   Patient is a former smoker.   ROS: Per HPI   Physical Exam Filed Vitals:   12/31/13 0906  BP: 138/94  Pulse: 76  Temp: 98.8 F (37.1 C)    Gen: Well NAD HEENT: PERRL,  MMM, no lip or tongue swelling, no OP swelling or masses noted, there is no appreciable neck fullness Lungs: CTABL Nl WOB Heart: RRR no MRG Exts: Non edematous BL  LE, warm and well perfused.  Skin: bilateral hands with erythema on the palms and a vesicular rash that is tender, there is no warmth   Assessment/Plan: Please see individual problem list.  Tommi Rumps, MD [facility] PGY-3

## 2013-12-31 NOTE — ED Notes (Signed)
Patient presents to Ed from [facility] via [place] , states she started having swelling and itching in her hands last week. States her hands are itching and burning. Was seen by her MD last week and is taking antibiotics however she states they aren't helping, only IV antibiotics will help. States her throat felt like it was swelling last pm, states she took Tylenol was able to swallow , doesn't appear to be in any acute distress.

## 2013-12-31 NOTE — Progress Notes (Signed)
Report received from David, RN

## 2013-12-31 NOTE — H&P (Signed)
[facility] Admission History and Physical Service Pager: [phone]  Patient name: [name] Medical record number: [id number] Date of birth: August 15, 1965 Age: 48 y.o. Gender: female  Primary Care Provider: Beverlyn Roux, MD Consultants: None Code Status: Full code  Chief Complaint: Hand blisters  Assessment and Plan: SIERA BEYERSDORF is a 48 y.o. female presenting with rash most likely dyshidrotic eczema. PMH is significant for COPD, anxiety, GERD, Hypothyroidism, Lymphadema  # Rash: appears consistent with dyshidrotic eczema. Patient with a prior history that seems to have responded to topical steroid treatment. Patient's symptoms already improved s/p one dose of solu-medrol 125mg . - place in observation, med-surg, attending Dr. Lindell Noe - clobetasol cream BID on bilateral palms daily - solu-medrol 40mg  once tomorrow AM  # GERD - continue protonix  # COPD - continue Dulera (substituted for home Advair) - albuterol PRN  # Hypothyroidism: Current TSH low at 0.118. Currently on levothyroxine 259mcg.  - continue levothyroxine - Since patient is in hospital and acutely ill, will hold of on changes to levothyroxine  # Hyperlipidemia - continue pravastatin 40mg   # Chronic lymphedema with bilateral leg paresthesia: stable - continue Cymbalta, Lyrica  # Chronic pain - continue percocet 10-325mg  q4hrs prn - Senokot daily  FEN/GI: Heart healthy/carb modified, SLIV Prophylaxis: heparin subq  Disposition: Place in observation, med-surg, attending Dr. Lindell Noe  History of Present Illness: [name] is a 48 y.o. female presenting with rash on bilateral palms. Patient was seen in clinic today for this rash and prescribed prednisone but sent to the ED for a funny feeling under her chin to rule out airway issues. History very difficult to obtain in patient. Seems patient first had rash last week although also states she was treated for this rash for about 2  months with oral antibiotics. States she has been admitted multiple times to [facility] for a similar issue and treated with IV antibiotics. Her rash is red with blisters and is painful. She is adamant that she needs admission with IV antibiotics for treatment. She states a tmax of 101 earlier today, treated with Tylenol. Currently no nausea, but had one episode of emesis earlier. Rash is localized to her palms. No diarrhea or constipation. No sick contacts.  Upon arrival to the ED, patient was given solumedrol 125mg  IV for rash.    Review Of Systems: Per HPI with the following additions: None Otherwise 12 point review of systems was performed and was unremarkable.  Patient Active Problem List   Diagnosis Date Noted  . Rash and nonspecific skin eruption 12/31/2013  . Throat fullness 12/31/2013  . Ingrown right big toenail 07/02/2013  . Inguinal abscess 07/02/2013  . Dysuria 05/18/2013  . Lateral epicondylitis 08/09/2012  . Chronic abdominal pain 04/04/2012  . Numbness and tingling of both legs 01/26/2012  . Premenstrual syndrome 09/20/2011  . GERD (gastroesophageal reflux disease) 06/24/2011  . Dyspepsia 06/24/2011  . Abdominal pain 05/26/2011  . Preventative health care 01/22/2011  . Obesity 01/22/2011  . Pelvic pain in female 12/29/2010  . History of irregular menstrual bleeding 04/11/2010  . Anxiety and depression 10/24/2009  . COPD (chronic obstructive pulmonary disease) 05/09/2009  . [place], [place] 01/05/2008  . Lymphedema 01/04/2007  . Hypothyroidism 05/05/2006  . History of tobacco use 05/05/2006  . Lumbar disc herniation 05/05/2006   Past Medical History: Past Medical History  Diagnosis Date  . Migraine   . Fibroadenosis breast   . Syphilis Treated in early 2012 during hospitalization  .  Depression   . Lymph edema     both legs and feet  . Anxiety   . Hypothyroidism   . COPD (chronic obstructive pulmonary disease)   . Hyperlipidemia     no meds -  diet controlled  . Heart murmur     as child  . GERD (gastroesophageal reflux disease)     does not take med  . Arthritis     knees,hands, hip  . Cellulitis   . Plantar fasciitis   . Varicose veins   . Colon polyps 2013    SESSIL SERRATED ADENOMA (ALL FRAGMENTS)   Past Surgical History: Past Surgical History  Procedure Laterality Date  . Thyroid surgery  2004    removed  . Cyst removed      behind left knee and left cheek  . Norplant removal      right upper arm - 12/2001  . Tubal ligation      2003  . Incision and drainage abscess / hematoma of bursa / knee / thigh      right upper thigh - 2008  . Eye surgery      bilarteral  . Svd      x 2  . Wisdom tooth extraction    . Endometrial ablation    . Esophagogastroduodenoscopy  2013    normal  . Colonoscopy  2013   Social History: History  Substance Use Topics  . Smoking status: Former Smoker -- 0.50 packs/day for 25 years    Types: Cigarettes    Quit date: 06/30/2012  . Smokeless tobacco: Never Used  . Alcohol Use: Yes     Comment: occasionally beer    Additional social history: None  Please also refer to relevant sections of EMR.  Family History: Family History  Problem Relation Age of Onset  . Diabetes Mother   . Hypertension Mother   . Hyperlipidemia Mother   . Diabetes Father   . Heart disease Father   . Hyperlipidemia Father   . Hypertension Father   . Diabetes Paternal Grandmother    Allergies and Medications: Allergies  Allergen Reactions  . Vicodin [Hydrocodone-Acetaminophen]     Itch   No current facility-administered medications on file prior to encounter.   Current Outpatient Prescriptions on File Prior to Encounter  Medication Sig Dispense Refill  . albuterol (PROAIR HFA) 108 (90 BASE) MCG/ACT inhaler Inhale 2 puffs into the lungs every 4 (four) hours as needed for shortness of breath.  18 g  5  . DULoxetine (CYMBALTA) 30 MG capsule Take 1 capsule (30 mg total) by mouth daily.  90  capsule  4  . fluticasone (FLONASE) 50 MCG/ACT nasal spray SPRAY TWICE IN EACH NOSTRIL DAILY  16 g  0  . Fluticasone-Salmeterol (ADVAIR DISKUS) 250-50 MCG/DOSE AEPB Inhale 1 puff into the lungs 2 (two) times daily.  1 each  4  . LORazepam (ATIVAN) 2 MG tablet Take 2 tablets (4 mg total) by mouth 2 (two) times daily as needed for anxiety.  60 tablet  4  . oxyCODONE-acetaminophen (PERCOCET) 10-325 MG per tablet Take 1 tablet by mouth every 4 (four) hours as needed for pain. Do not fill before 02/24/14  60 tablet  0  . pantoprazole (PROTONIX) 40 MG tablet Take 1 tablet (40 mg total) by mouth daily.  90 tablet  4  . pravastatin (PRAVACHOL) 40 MG tablet Take 1 tablet (40 mg total) by mouth daily.  90 tablet  4    Objective: BP 144/84  Pulse 50  Temp(Src) 98.8 F (37.1 C) (Oral)  Resp 17  Ht 5\' 8"  (1.727 m)  Wt 234 lb (106.142 kg)  BMI 35.59 kg/m2  SpO2 100%  LMP 12/11/2013 Exam: General: Fair appearing, obese, in no distress HEENT: PERRLA, EOMI, moist mucous membranes, no nuchal rigidity Cardiovascular: regular rate and rhythm, without murmur Respiratory: clear to auscultation bilaterally, without wheezing Abdomen: Soft, non-tender, non-distended, bowel sounds present Extremities: Bilateral 4+ non-pitting edema, 2+ radial pulses bilaterally, 1+ DP pulses bilaterally Skin: Bilateral palms with erythematous, vesicular rash that is tender with no warmth Neuro: Alert, oriented x3    Labs and Imaging: CBC BMET   Recent Labs Lab 12/31/13 1157  WBC 3.8*  HGB 13.7  HCT 39.8  PLT 263    Recent Labs Lab 12/31/13 1157  NA 138  K 3.7  CL 104  CO2 23  BUN 11  CREATININE 0.83  GLUCOSE 88  CALCIUM 8.5     TSH  Date Value Ref Range Status  12/31/2013 0.118* 0.350 - 4.500 uIU/mL Final    Cordelia Poche, MD 12/31/2013, 6:35 PM PGY-2, [facility] Intern pager: [phone], text pages welcome

## 2013-12-31 NOTE — Patient Instructions (Signed)
Nice to meet you. We are sending you to the ED for evaluation of your throat. Your hands appear to have a reaction related to a fungal infection. We are going to treat you with steroids for this.

## 2013-12-31 NOTE — Progress Notes (Signed)
[facility] Attending Admit Note  Patient seen and examined by me, discussed with resident team.  Briefly, 48yoF who was seen earlier today in [facility] for painful pruritic rash across palms of both hands, sent to ED for assessment of swallowing.  Patient reports prior admissions for what she describes as infections of the skin on her palms as well as lower extremity edema, which she states only improves with IV antibiotics.  Patient received SoluMedrol 112m IV in ED.  She is extremely apprehensive about the possibility of being discharged to home, stating that she will be back in ED if discharged.  Patient reports temp of 101F at home, [facility]   QUit smoking April 2014.  Family Hx; No family hx of SLE or other connective tissue diseases. Thyroid disease in family.  Exam: Alert, no apparent distress.  HEENT neck supple. No cervical adenopathy SKIN: Diffuse erythema with eruption of small vesicular lesions on fingers of both hands, most prominent along lateral surfaces of digits. Palpable radial pulses bilaterally.  Lower extremities: lichenoid hyperpigmented skin thickening along ankles; 3+ nonpitting edema of bilateral LEs, no erythema or calor.   A/P: Patient's presentation suggestive of severe dishidrotic eczema; patient is resistant to my medical opinion that her condition is not infectious, but rather is inflammatory in nature without infection.  We have opted for an observation admission, with plan for IV solumedrol, following her VS and CBC; may also check ESR/CRP, and ANA in the morning. Topical high-potency steroids.  If improvement, then patient for discharge in the morning.  We also discussed the possibility of punch biopsy of lesions to help in confirming the diagnosis.  JDalbert Mayotte MD

## 2013-12-31 NOTE — ED Notes (Signed)
Phlebotomy unable to obtain lab specimens at this time.  RN attempted x 1, unsuccessful attempt.  IV team paged.

## 2013-12-31 NOTE — ED Notes (Signed)
IV team at bedside for IV placement  

## 2013-12-31 NOTE — ED Provider Notes (Signed)
CSN: [id number]     Arrival date & time 12/31/13  1016 History   First MD Initiated Contact with Patient 12/31/13 1025     Chief Complaint  Patient presents with  . Rash     (Consider location/radiation/quality/duration/timing/severity/associated sxs/prior Treatment) HPI [name] is a 48 y.o. female with a history of lymphedema who is transported from her family medicine via [facility] to the ED for evaluation of potential myxedema and rash to bilateral hands. Patient states she has had these symptoms many times before in the past. She reports she is usually admitted for IV antibiotics as that is the only thing that makes the rash better. States most recently the rash occurred on Saturday, got a little better over the weekend and is worse today. She reports the rash to be painful and characterizes it as a burning, throbbing, stinging sensation. She reports having been on Augmentin for the past 2 months for this rash, but recently discontinued due to inefficacy. She also reports that her throat "feels funny". She says that under her jaw feels numb, but not painful. She denies any difficulties with respiration, swallowing, eating or drinking. No fevers, chest pain, shortness of breath, abdominal pain.    Past Medical History  Diagnosis Date  . Migraine   . Fibroadenosis breast   . Syphilis Treated in early 2012 during hospitalization  . Depression   . Lymph edema     both legs and feet  . Anxiety   . Hypothyroidism   . COPD (chronic obstructive pulmonary disease)   . Hyperlipidemia     no meds - diet controlled  . Heart murmur     as child  . GERD (gastroesophageal reflux disease)     does not take med  . Arthritis     knees,hands, hip  . Cellulitis   . Plantar fasciitis   . Varicose veins   . Colon polyps 2013    SESSIL SERRATED ADENOMA (ALL FRAGMENTS)   Past Surgical History  Procedure Laterality Date  . Thyroid surgery  2004    removed  . Cyst removed      behind  left knee and left cheek  . Norplant removal      right upper arm - 12/2001  . Tubal ligation      2003  . Incision and drainage abscess / hematoma of bursa / knee / thigh      right upper thigh - 2008  . Eye surgery      bilarteral  . Svd      x 2  . Wisdom tooth extraction    . Endometrial ablation    . Esophagogastroduodenoscopy  2013    normal  . Colonoscopy  2013   Family History  Problem Relation Age of Onset  . Diabetes Mother   . Hypertension Mother   . Hyperlipidemia Mother   . Diabetes Father   . Heart disease Father   . Hyperlipidemia Father   . Hypertension Father   . Diabetes Paternal Grandmother    History  Substance Use Topics  . Smoking status: Former Smoker -- 0.50 packs/day for 25 years    Types: Cigarettes    Quit date: 06/30/2012  . Smokeless tobacco: Never Used  . Alcohol Use: Yes     Comment: occasionally beer    OB History   Grav Para Term Preterm Abortions TAB SAB Ect Mult Living   4 2 1 1 2 2     2  Review of Systems  Constitutional: Negative for fever.  HENT: Negative for sore throat.   Eyes: Negative for visual disturbance.  Respiratory: Negative for shortness of breath.   Cardiovascular: Negative for chest pain.  Gastrointestinal: Negative for abdominal pain.  Endocrine: Negative for polyuria.  Genitourinary: Negative for dysuria.  Skin: Positive for rash.  Neurological: Negative for headaches.      Allergies  Vicodin  Home Medications   Prior to Admission medications   Medication Sig Start Date End Date Taking? Authorizing Provider  albuterol (PROAIR HFA) 108 (90 BASE) MCG/ACT inhaler Inhale 2 puffs into the lungs every 4 (four) hours as needed for shortness of breath. 12/25/13   Frazier Richards, MD  DULoxetine (CYMBALTA) 30 MG capsule Take 1 capsule (30 mg total) by mouth daily. 12/25/13   Frazier Richards, MD  fluticasone (FLONASE) 50 MCG/ACT nasal spray SPRAY TWICE IN EACH NOSTRIL DAILY 09/24/13   Frazier Richards, MD   Fluticasone-Salmeterol (ADVAIR DISKUS) 250-50 MCG/DOSE AEPB Inhale 1 puff into the lungs 2 (two) times daily. 12/25/13   Frazier Richards, MD  levothyroxine (SYNTHROID, LEVOTHROID) 200 MCG tablet Take 1 tablet (200 mcg total) by mouth daily. 12/25/13 12/25/14  Frazier Richards, MD  LORazepam (ATIVAN) 2 MG tablet Take 2 tablets (4 mg total) by mouth 2 (two) times daily as needed for anxiety. 12/25/13   Frazier Richards, MD  oxyCODONE-acetaminophen (PERCOCET) 10-325 MG per tablet Take 1 tablet by mouth every 4 (four) hours as needed for pain. Do not fill before 02/24/14 12/25/13   Frazier Richards, MD  pantoprazole (PROTONIX) 40 MG tablet Take 1 tablet (40 mg total) by mouth daily. 12/25/13   Frazier Richards, MD  pravastatin (PRAVACHOL) 40 MG tablet Take 1 tablet (40 mg total) by mouth daily. 09/24/13   Frazier Richards, MD  predniSONE (DELTASONE) 50 MG tablet Take 1 tablet (50 mg total) by mouth daily with breakfast. 12/31/13   Leone Haven, MD  pregabalin (LYRICA) 150 MG capsule TAKE ONE CAPSULE BY MOUTH TWICE DAILY 12/25/13   Frazier Richards, MD   BP 150/103  Pulse 63  Temp(Src) 98.8 F (37.1 C) (Oral)  Ht 5\' 8"  (1.727 m)  Wt 234 lb (106.142 kg)  BMI 35.59 kg/m2  SpO2 100%  LMP 12/11/2013 Physical Exam  Nursing note and vitals reviewed. Constitutional: She is oriented to person, place, and time. She appears well-developed and well-nourished.  HENT:  Head: Normocephalic and atraumatic.  Mouth/Throat: Oropharynx is clear and moist.  Dark, intermittent pigmentation of oropharynx and tongue. No sign of subglossal infection. No lymphadenopathy.  Eyes: Conjunctivae are normal. Pupils are equal, round, and reactive to light. Right eye exhibits no discharge. Left eye exhibits no discharge. No scleral icterus.  Neck: Neck supple.  Cardiovascular: Normal rate, regular rhythm and normal heart sounds.   Pulmonary/Chest: Effort normal and breath sounds normal. No respiratory distress. She has no wheezes. She has  no rales.  Abdominal: Soft. There is no tenderness.  Musculoskeletal: She exhibits no tenderness.  Neurological: She is alert and oriented to person, place, and time.  Cranial Nerves II-XII grossly intact  Skin: Skin is warm and dry.  Bilateral erythematous, papular, vesicular rash to palms and minimally to dorsum of hands. No open or draining lesions.  Psychiatric: She has a normal mood and affect.    ED Course  Procedures (including critical care time) Labs Review Labs Reviewed  CBC WITH DIFFERENTIAL  BASIC METABOLIC PANEL  TSH  Imaging Review No results found.   EKG Interpretation None      MDM  Vitals stable - WNL -afebrile Consult placed to [facility]- they will come see pt in ED and will dispo. Family practice will admit to observation. Pt resting comfortably in ED. PE-papular vesicular rash on palms of hands treated in ED with IV Solu-Medrol and Benadryl. No evidence of other acute or emergent pathology. No sign of respiratory compromise. Labwork noncontributory.  Discussed f/u with PCP and return precautions, pt very amenable to plan. Prior to patient discharge, I discussed and reviewed this case with Dr.Linker      Final diagnoses:  Rash and nonspecific skin eruption        Verl Dicker, PA-C 01/01/14 1637

## 2014-01-01 LAB — URINALYSIS, ROUTINE W REFLEX MICROSCOPIC
BILIRUBIN URINE: NEGATIVE
GLUCOSE, UA: NEGATIVE mg/dL
Ketones, ur: NEGATIVE mg/dL
Leukocytes, UA: NEGATIVE
Nitrite: NEGATIVE
PROTEIN: NEGATIVE mg/dL
Specific Gravity, Urine: 1.021 (ref 1.005–1.030)
Urobilinogen, UA: 0.2 mg/dL (ref 0.0–1.0)
pH: 5.5 (ref 5.0–8.0)

## 2014-01-01 LAB — CBC
HCT: 37.8 % (ref 36.0–46.0)
Hemoglobin: 13.1 g/dL (ref 12.0–15.0)
MCH: 34.5 pg — ABNORMAL HIGH (ref 26.0–34.0)
MCHC: 34.7 g/dL (ref 30.0–36.0)
MCV: 99.5 fL (ref 78.0–100.0)
PLATELETS: 263 10*3/uL (ref 150–400)
RBC: 3.8 MIL/uL — AB (ref 3.87–5.11)
RDW: 11.9 % (ref 11.5–15.5)
WBC: 7.2 10*3/uL (ref 4.0–10.5)

## 2014-01-01 LAB — URINE MICROSCOPIC-ADD ON

## 2014-01-01 LAB — SEDIMENTATION RATE: SED RATE: 13 mm/h (ref 0–22)

## 2014-01-01 LAB — GLUCOSE, CAPILLARY: GLUCOSE-CAPILLARY: 136 mg/dL — AB (ref 70–99)

## 2014-01-01 LAB — HEMOGLOBIN A1C
Hgb A1c MFr Bld: 5.1 % (ref <5.7)
Mean Plasma Glucose: 100 mg/dL (ref <117)

## 2014-01-01 LAB — C-REACTIVE PROTEIN: CRP: <0.5 mg/dL — ABNORMAL LOW (ref <0.60)

## 2014-01-01 MED ORDER — DIPHENHYDRAMINE HCL 50 MG PO CAPS
50.0000 mg | ORAL_CAPSULE | Freq: Once | ORAL | Status: AC
  Administered 2014-01-01: 50 mg via ORAL
  Filled 2014-01-01: qty 1
  Filled 2014-01-01: qty 2

## 2014-01-01 MED ORDER — AQUAPHOR EX OINT
TOPICAL_OINTMENT | Freq: Two times a day (BID) | CUTANEOUS | Status: DC | PRN
  Filled 2014-01-01: qty 50

## 2014-01-01 MED ORDER — CLOBETASOL PROPIONATE 0.05 % EX CREA: TOPICAL_CREAM | Freq: Two times a day (BID) | CUTANEOUS | Status: DC

## 2014-01-01 MED ORDER — LEVOTHYROXINE SODIUM 175 MCG PO TABS: 175.0000 ug | ORAL_TABLET | Freq: Every day | ORAL | Status: DC

## 2014-01-01 MED ORDER — LEVOTHYROXINE SODIUM 150 MCG PO TABS: 150.0000 ug | ORAL_TABLET | Freq: Every day | ORAL | Status: DC

## 2014-01-01 MED ORDER — HYDROXYZINE HCL 25 MG PO TABS
25.0000 mg | ORAL_TABLET | Freq: Three times a day (TID) | ORAL | Status: DC | PRN
  Administered 2014-01-01: 25 mg via ORAL
  Filled 2014-01-01: qty 1

## 2014-01-01 MED ORDER — SENNA 8.6 MG PO TABS: 2.0000 | ORAL_TABLET | Freq: Every day | ORAL | Status: DC

## 2014-01-01 MED ORDER — ONDANSETRON HCL 4 MG PO TABS
4.0000 mg | ORAL_TABLET | Freq: Once | ORAL | Status: AC
  Administered 2014-01-01: 4 mg via ORAL
  Filled 2014-01-01: qty 1

## 2014-01-01 MED ORDER — PREDNISONE 50 MG PO TABS: ORAL_TABLET | ORAL | Status: DC

## 2014-01-01 NOTE — Progress Notes (Signed)
UR completed 

## 2014-01-01 NOTE — Discharge Instructions (Signed)
Discharge Date: 01/01/2014  Reason for Hospitalization: Rash -Please continue your oral steroids as prescribed -I prescribed the steroid cream for you to use on your hands. -Please contact your PCP office for any other medication refills -Please follow-up in clinic so we can make sure the rash has resolved.    Thank you for letting us participate in your care!

## 2014-01-01 NOTE — H&P (Signed)
Patient seen and examined by me, discussed with Dr Lonny Prude and I agree with his assessment and plan. Separate note.  JB

## 2014-01-01 NOTE — Progress Notes (Signed)
Pt complaining of itching after receiving pain medication. On call provider notified. Benadryl ordered

## 2014-01-01 NOTE — Discharge Summary (Signed)
[name] Discharge Summary  Patient name: [name] Medical record number: [id number] Date of birth: 04/06/1965 Age: 48 y.o. Gender: female Date of Admission: 12/31/2013  Date of Discharge: 01/01/2014 Admitting Physician: Lauren Niece, MD  Primary Care Provider: Beverlyn Roux, MD Consultants: None  Indication for Hospitalization: Palmer Rash  Discharge Diagnoses/Problem List:  Patient Active Problem List   Diagnosis Date Noted  . Rash and nonspecific skin eruption 12/31/2013  . Throat fullness 12/31/2013  . 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 Ingrown right big toenail 07/02/2013  . Inguinal abscess 07/02/2013  . Dysuria 05/18/2013  . Lateral epicondylitis 08/09/2012  . Chronic abdominal pain 04/04/2012  . Numbness and tingling of both legs 01/26/2012  . Premenstrual syndrome 09/20/2011  . GERD (gastroesophageal reflux disease) 06/24/2011  . Dyspepsia 06/24/2011  . Abdominal pain 05/26/2011  . Preventative health care 01/22/2011  . Obesity 01/22/2011  . Pelvic pain in female 12/29/2010  . History of irregular menstrual bleeding 04/11/2010  . Anxiety and depression 10/24/2009  . COPD (chronic obstructive pulmonary disease) 05/09/2009  . [place], [place] 01/05/2008  . Lymphedema 01/04/2007  . Hypothyroidism 05/05/2006  . History of tobacco use 05/05/2006  . Lumbar disc herniation 05/05/2006    Disposition: Home  Discharge Condition: Stable  Discharge Exam:  General: obese, in no distress  Cardiovascular: regular rate and rhythm, without murmur  Respiratory: clear to auscultation bilaterally, without wheezing  Abdomen: Soft, non-tender, non-distended, bowel sounds present  Extremities: Bilateral 4+ non-pitting edema, 2+ radial pulses bilaterally, 1+ DP pulses bilaterally  Skin: Bilateral palms with mild erythema, minimal vesicles    Brief Hospital Course:  [name] is a 48 y.o. female who presented with rash most  likely dyshidrotic eczema. PMH is significant for COPD, anxiety, GERD, Hypothyroidism, Lymphadema   Her hospital course by problem is below:  # Palmer Rash: Appears consistent with dyshidrotic eczema. Patient with a prior history that seems to have responded to topical steroid treatment. Patient's symptoms already improved s/p one dose of solu-medrol 125mg . She however received one more dose of solu-medrol 40mg .  Patient also received clobetasol cream BID daily to apply to palms. She will be discharged with prednisone for 5 days.  # GERD: Continued protonix  # COPD: Continued Dulera (substituted for home Advair). Albuterol PRN. # Hypothyroidism: Current TSH low at 0.118. Currently on levothyroxine 224mcg reduced it to 140mcg.  # Hyperlipidemia: Continued pravastatin 40mg   # Chronic lymphedema with bilateral leg paresthesia: Stable. Continued Cymbalta, Lyrica  # Chronic pain: Continued percocet 10-325mg  q4hrs prn with bowel regimen of Senokot daily  Issues for Follow Up:  1. Recheck TSH levels. We dosed down her synthroid. 2. Monitor rash for improvement.  Significant Procedures: None  Significant Labs and Imaging:   Recent Labs Lab 12/31/13 1157 01/01/14 0700  WBC 3.8* 7.2  HGB 13.7 13.1  HCT 39.8 37.8  PLT 263 263    Recent Labs Lab 12/31/13 1157  NA 138  K 3.7  CL 104  CO2 23  GLUCOSE 88  BUN 11  CREATININE 0.83  CALCIUM 8.5   TSH 0.118  Results/Tests Pending at Time of Discharge: ANA, A1c  Discharge Medications:    Medication List         acetaminophen 500 MG tablet  Commonly known as:  TYLENOL  Take 1,000 mg by mouth every 6 (six) hours as needed for moderate pain or fever.     albuterol 108 (90 BASE) MCG/ACT inhaler  Commonly known as:  PROAIR HFA  Inhale 2 puffs into the lungs every 4 (four) hours as needed for shortness of breath.     clobetasol cream 0.05 %  Commonly known as:  TEMOVATE  Apply topically 2 (two) times daily.     DULoxetine 30 MG  capsule  Commonly known as:  CYMBALTA  Take 1 capsule (30 mg total) by mouth daily.     fluticasone 50 MCG/ACT nasal spray  Commonly known as:  FLONASE  SPRAY TWICE IN EACH NOSTRIL DAILY     Fluticasone-Salmeterol 250-50 MCG/DOSE Aepb  Commonly known as:  ADVAIR DISKUS  Inhale 1 puff into the lungs 2 (two) times daily.     levothyroxine 150 MCG tablet  Commonly known as:  SYNTHROID  Take 1 tablet (150 mcg total) by mouth daily before breakfast.     LORazepam 2 MG tablet  Commonly known as:  ATIVAN  Take 2 tablets (4 mg total) by mouth 2 (two) times daily as needed for anxiety.     oxyCODONE-acetaminophen 10-325 MG per tablet  Commonly known as:  PERCOCET  Take 1 tablet by mouth every 4 (four) hours as needed for pain. Do not fill before 02/24/14     pantoprazole 40 MG tablet  Commonly known as:  PROTONIX  Take 1 tablet (40 mg total) by mouth daily.     pravastatin 40 MG tablet  Commonly known as:  PRAVACHOL  Take 1 tablet (40 mg total) by mouth daily.     predniSONE 50 MG tablet  Commonly known as:  DELTASONE  Take once daily by mouth.     pregabalin 150 MG capsule  Commonly known as:  LYRICA  Take 150 mg by mouth 2 (two) times daily.     triamcinolone cream 0.1 %  Commonly known as:  KENALOG  Apply 1 application topically 4 (four) times daily.        Discharge Instructions: Please refer to Patient Instructions section of EMR for full details.  Patient was counseled important signs and symptoms that should prompt return to medical care, changes in medications, dietary instructions, activity restrictions, and follow up appointments.   Follow-Up Appointments: Follow-up Information   Follow up with Thersa Salt, DO On 01/07/2014. (@ 2:30p for follow-up of rash)    Specialty:  Family Medicine   Contact information:   [place] [address] [phone]       Katheren Shams, DO 01/01/2014, 1:39 PM PGY-1, [place]

## 2014-01-01 NOTE — Progress Notes (Signed)
Pt. received discharge paperwork, follow up appointments and  education on new medications and how to take them. Pt. Stated she understood medications, side effects and s/s to monitor for in case of a reaction. Removed IV; no complications. Nurse Tech Delsa Sale) assisted pt. Downstairs. D/C to home. No further needs noted at this time.

## 2014-01-02 LAB — ANA: ANA: NEGATIVE

## 2014-01-02 NOTE — ED Provider Notes (Signed)
Medical screening examination/treatment/procedure(s) were conducted as a shared visit with non-physician practitioner(s) and myself.  I personally evaluated the patient during the encounter.   EKG Interpretation None     Pt seen and examined, pt with maculopapular and vesicular rash of hands, pt referred to the ED from family practice clinic.  Pt treated with benadryl, solumedrol, doubt cellulitis.  Family practice team has come to see patient in the ED and will determine admission versus outpatient treatment with close followup in their clinic.    Threasa Beards, MD 01/02/14 [phone]

## 2014-01-07 ENCOUNTER — Encounter (HOSPITAL_COMMUNITY): Payer: Self-pay | Admitting: Emergency Medicine

## 2014-01-08 ENCOUNTER — Inpatient Hospital Stay: Payer: Medicaid Other | Admitting: Family Medicine

## 2014-01-10 NOTE — Assessment & Plan Note (Signed)
Stable on current synthroid dose, refill today

## 2014-01-10 NOTE — Assessment & Plan Note (Signed)
Needs to be re-referred for endoscopy with pyrtle, reports he wanted to see her back but she just needs referal entered again for insurance - referred to GI

## 2014-01-10 NOTE — Assessment & Plan Note (Signed)
Well controlled, no exacerbation, refilled meds

## 2014-01-10 NOTE — Assessment & Plan Note (Signed)
Stable, continue elevation, compression, lyrica

## 2014-01-10 NOTE — Progress Notes (Signed)
   Subjective:    Patient ID: [name], female    DOB: 1965-09-06, 48 y.o.   MRN: [id number]  HPI Pt presents for rash on hands. Reports it started after she took a vicodin for her lymphedema pain, even though she knew she was allergic. Her hands became very swollen and painful. They have gotten somewhat better with steroid cream and the swelling has gone down but they still feel sore. No fever, joint pain.   Review of Systems See HPI    Objective:   Physical Exam  Constitutional: She is oriented to person, place, and time. She appears well-developed and well-nourished. No distress.  HENT:  Head: Normocephalic and atraumatic.  Eyes: Conjunctivae are normal. Right eye exhibits no discharge. Left eye exhibits no discharge. No scleral icterus.  Cardiovascular: Normal rate, regular rhythm, normal heart sounds and intact distal pulses.   No murmur heard. Pulmonary/Chest: Effort normal and breath sounds normal. No respiratory distress. She has no wheezes.  Abdominal: She exhibits no distension.  Musculoskeletal:       Right ankle: She exhibits swelling.       Left ankle: She exhibits swelling.  Massive bilateral lymphedema of lower extremities R>L  Neurological: She is alert and oriented to person, place, and time.  Skin: Skin is warm, dry and intact. She is not diaphoretic.     Psychiatric: She has a normal mood and affect. Her behavior is normal.  Nursing note and vitals reviewed.         Assessment & Plan:

## 2014-01-10 NOTE — Assessment & Plan Note (Addendum)
Hands very dry, patient reports formerly very swollen 2/2 vicodin, improving now - continue triamcinolone and vaseline

## 2014-01-22 ENCOUNTER — Other Ambulatory Visit: Payer: Self-pay | Admitting: Family Medicine

## 2014-01-22 NOTE — Telephone Encounter (Signed)
Pt called and wants to pick up her pain medication 3 days early. The pharmacy will not release this unless the doctor calls and gives them the okay to do so. jw

## 2014-01-22 NOTE — Telephone Encounter (Signed)
When asked why she needs meds filled early patient states that her "lymphedema is acting up and she had just fallen and is down to her last pill" patient states she was recently release from hospital and wanted MD to be aware since she didn't come see her. Will forward to PCP.

## 2014-01-23 NOTE — Telephone Encounter (Signed)
Verbal authorization given to pharmacy, patient informed.

## 2014-01-23 NOTE — Telephone Encounter (Signed)
Left message for patient to call back, when she does please ask her which pharmacy she uses for she pain medications. Thanks!

## 2014-01-23 NOTE — Telephone Encounter (Signed)
Can someone call her pharmacy and give them the ok from me?  Thanks.

## 2014-01-23 NOTE — Telephone Encounter (Signed)
Pt called back to let us know that the pharmacy she is using is [facility] on [address]. jw

## 2014-01-23 NOTE — Telephone Encounter (Signed)
Pt called back to inquire about request for medication refill for pain.  Please review and forward

## 2014-02-11 ENCOUNTER — Ambulatory Visit (INDEPENDENT_AMBULATORY_CARE_PROVIDER_SITE_OTHER): Payer: Medicaid Other | Admitting: Family Medicine

## 2014-02-11 ENCOUNTER — Encounter: Payer: Self-pay | Admitting: Family Medicine

## 2014-02-11 VITALS — BP 129/79 | HR 76 | Temp 98.1°F | Ht 68.0 in | Wt 229.0 lb

## 2014-02-11 DIAGNOSIS — F418 Other specified anxiety disorders: Secondary | ICD-10-CM

## 2014-02-11 DIAGNOSIS — J449 Chronic obstructive pulmonary disease, unspecified: Secondary | ICD-10-CM

## 2014-02-11 DIAGNOSIS — E890 Postprocedural hypothyroidism: Secondary | ICD-10-CM

## 2014-02-11 DIAGNOSIS — L732 Hidradenitis suppurativa: Secondary | ICD-10-CM

## 2014-02-11 DIAGNOSIS — F419 Anxiety disorder, unspecified: Secondary | ICD-10-CM

## 2014-02-11 DIAGNOSIS — F411 Generalized anxiety disorder: Secondary | ICD-10-CM

## 2014-02-11 DIAGNOSIS — L301 Dyshidrosis [pompholyx]: Secondary | ICD-10-CM

## 2014-02-11 DIAGNOSIS — I890 Lymphedema, not elsewhere classified: Secondary | ICD-10-CM

## 2014-02-11 DIAGNOSIS — F32A Depression, unspecified: Secondary | ICD-10-CM

## 2014-02-11 DIAGNOSIS — F329 Major depressive disorder, single episode, unspecified: Secondary | ICD-10-CM

## 2014-02-11 DIAGNOSIS — R6889 Other general symptoms and signs: Secondary | ICD-10-CM

## 2014-02-11 DIAGNOSIS — R198 Other specified symptoms and signs involving the digestive system and abdomen: Secondary | ICD-10-CM

## 2014-02-11 DIAGNOSIS — R0989 Other specified symptoms and signs involving the circulatory and respiratory systems: Secondary | ICD-10-CM

## 2014-02-11 MED ORDER — LORAZEPAM 2 MG PO TABS: 4.0000 mg | ORAL_TABLET | Freq: Two times a day (BID) | ORAL | Status: DC | PRN

## 2014-02-11 MED ORDER — ALBUTEROL SULFATE HFA 108 (90 BASE) MCG/ACT IN AERS: 2.0000 | INHALATION_SPRAY | RESPIRATORY_TRACT | Status: DC | PRN

## 2014-02-11 MED ORDER — CLOBETASOL PROPIONATE 0.05 % EX CREA: TOPICAL_CREAM | Freq: Two times a day (BID) | CUTANEOUS | Status: DC

## 2014-02-11 MED ORDER — FLUTICASONE-SALMETEROL 250-50 MCG/DOSE IN AEPB: 1.0000 | INHALATION_SPRAY | Freq: Two times a day (BID) | RESPIRATORY_TRACT | Status: DC

## 2014-02-11 MED ORDER — AMOXICILLIN-POT CLAVULANATE 875-125 MG PO TABS: 1.0000 | ORAL_TABLET | Freq: Two times a day (BID) | ORAL | Status: DC

## 2014-02-11 MED ORDER — OXYCODONE-ACETAMINOPHEN 10-325 MG PO TABS: 1.0000 | ORAL_TABLET | ORAL | Status: DC | PRN

## 2014-02-11 MED ORDER — FLUTICASONE PROPIONATE 50 MCG/ACT NA SUSP: NASAL | Status: DC

## 2014-02-11 MED ORDER — DULOXETINE HCL 30 MG PO CPEP: 30.0000 mg | ORAL_CAPSULE | Freq: Every day | ORAL | Status: DC

## 2014-02-11 NOTE — Patient Instructions (Signed)
For your choking and throat swelling I would like you to be seen by an ear nose and throat specialist. We will set this up and let you know when it will be.  I would also like to check your thyroid and will call if any adjustments need to be made to your medication.

## 2014-02-12 LAB — TSH: TSH: 0.034 u[IU]/mL — ABNORMAL LOW (ref 0.350–4.500)

## 2014-02-12 MED ORDER — LEVOTHYROXINE SODIUM 175 MCG PO TABS: 175.0000 ug | ORAL_TABLET | Freq: Every day | ORAL | Status: DC

## 2014-02-19 NOTE — Assessment & Plan Note (Signed)
Patient not compliant with treatments but very dissatisfied with QOL and interested in hearing what vascular has to say again/complying - refer to vascular

## 2014-02-19 NOTE — Assessment & Plan Note (Addendum)
Recent low TSH with patient not aware of dose adjustment made in hospital - TSH low today, 0.034 - will decrease synthroid dose from 28mcg to 168mcg and recheck in 1 month

## 2014-02-19 NOTE — Progress Notes (Signed)
   Subjective:    Patient ID: [name], female    DOB: [birth year], 48 y.o.   MRN: [id number]  HPI Pt presents for lymphedema and choking spells.  She has a long history of congenital lymphedema. She has a lot of pain from this and has been managed with lyrica, elevation and compression but has not been compliant with the latter 2. She is interested in returning to vascular to try some of the traditional therapies that she has been resistant to in the past. We had a long discussion about her quality of life and what she needs to do to improve this.  She is also complaining of recent spells where she feels as though she is choking and cannot breath in or out. It lasts several minutes at a time and has happened several times a day for the past few weeks. She has had these in the past when her thyroid level was off but they never lasted more than 30 seconds. She thinks it is related to staples that were left in her throat when she had her thyroid surgery. She has tried using albuterol when this happens but it does not help.    Review of Systems  Constitutional: Negative for fever.  Respiratory: Positive for choking, shortness of breath, wheezing and stridor.   Cardiovascular: Positive for leg swelling.  Musculoskeletal: Positive for myalgias.  Neurological: Positive for light-headedness and numbness.  All other systems reviewed and are negative.      Objective:   Physical Exam  Constitutional: She is oriented to person, place, and time. She appears well-developed and well-nourished. No distress.  HENT:  Head: Normocephalic and atraumatic.  Eyes: Conjunctivae are normal. Right eye exhibits no discharge. Left eye exhibits no discharge. No scleral icterus.  Neck: Normal range of motion. Neck supple. No JVD present. No tracheal deviation present. No thyromegaly present.  Cardiovascular: Normal rate, regular rhythm and normal heart sounds.   No murmur heard. Pulmonary/Chest: Effort normal  and breath sounds normal. No stridor. No respiratory distress. She has no wheezes.  Abdominal: Soft. She exhibits no distension. There is no tenderness.  Musculoskeletal: She exhibits edema.  Massive LE lymphedema bilaterally  Lymphadenopathy:    She has no cervical adenopathy.  Neurological: She is alert and oriented to person, place, and time.  Skin: Skin is warm and dry. She is not diaphoretic.  Psychiatric: She has a normal mood and affect. Her behavior is normal.  Nursing note and vitals reviewed.         Assessment & Plan:

## 2014-02-19 NOTE — Assessment & Plan Note (Signed)
Choking spells and recent throat fullness concern for mass vs laryngospasm, patient attributes to thyroid being "out of wack" - refer to ENT - patient on PPI, continue this - recheck TSH and adjust synthroid as indicated

## 2014-02-19 NOTE — Assessment & Plan Note (Signed)
Poorly controlled on maximal therapy, patient reports only IV abx help significantly - refilled clobetasol, continue vaseline

## 2014-02-22 ENCOUNTER — Other Ambulatory Visit: Payer: Self-pay | Admitting: Family Medicine

## 2014-02-22 ENCOUNTER — Telehealth: Payer: Self-pay | Admitting: Family Medicine

## 2014-02-22 DIAGNOSIS — I890 Lymphedema, not elsewhere classified: Secondary | ICD-10-CM

## 2014-02-22 MED ORDER — OXYCODONE-ACETAMINOPHEN 10-325 MG PO TABS: 1.0000 | ORAL_TABLET | ORAL | Status: DC | PRN

## 2014-02-22 NOTE — Telephone Encounter (Signed)
[facility] on [address] and spoke with pharmacist she states that system states that license is inactive, faxed over a copy of [place] state license to pharmacy

## 2014-02-22 NOTE — Telephone Encounter (Signed)
Asked to rewrite prescription originally written by resident, pharmacy ([facility] [address]) would not honor our resident's Rx. Rx printed. JB

## 2014-02-22 NOTE — Telephone Encounter (Signed)
Pt trying to get rx from pharmacy, they told her Dr. Quinn Plowman license was expired. Pt spoke with Pamala Hurry [facility]) earlier today and she checked into it and told the pt Dr. Quinn Plowman license is current and there shouldn't be anything wrong with it. Pt. Called the pharmacy back and they said they would need something faxed to them showing the license is ok. Pt would like [place] to call the pharmacy and straighten this out. Pt would like a call back once done so she can get her medication. pt goes to [facility]

## 2014-03-15 ENCOUNTER — Telehealth: Payer: Self-pay | Admitting: Family Medicine

## 2014-03-15 NOTE — Telephone Encounter (Signed)
Pt asking for Dr. Sherril Cong to call her, she made an appt for her medication refill but due to the trouble she had filling the medication last month she is feeling unsure and wants to reassurance.

## 2014-03-25 ENCOUNTER — Encounter: Payer: Self-pay | Admitting: Family Medicine

## 2014-03-25 ENCOUNTER — Ambulatory Visit (INDEPENDENT_AMBULATORY_CARE_PROVIDER_SITE_OTHER): Payer: Medicaid Other | Admitting: Family Medicine

## 2014-03-25 VITALS — BP 147/96 | HR 69 | Temp 98.8°F | Ht 68.0 in | Wt 232.2 lb

## 2014-03-25 DIAGNOSIS — E890 Postprocedural hypothyroidism: Secondary | ICD-10-CM

## 2014-03-25 DIAGNOSIS — I890 Lymphedema, not elsewhere classified: Secondary | ICD-10-CM

## 2014-03-25 LAB — BASIC METABOLIC PANEL
BUN: 7 mg/dL (ref 6–23)
CO2: 24 mEq/L (ref 19–32)
CREATININE: 0.79 mg/dL (ref 0.50–1.10)
Calcium: 9.2 mg/dL (ref 8.4–10.5)
Chloride: 104 mEq/L (ref 96–112)
Glucose, Bld: 78 mg/dL (ref 70–99)
POTASSIUM: 3.8 meq/L (ref 3.5–5.3)
Sodium: 138 mEq/L (ref 135–145)

## 2014-03-25 LAB — TSH: TSH: 0.507 u[IU]/mL (ref 0.350–4.500)

## 2014-03-25 MED ORDER — OXYCODONE-ACETAMINOPHEN 10-325 MG PO TABS: 1.0000 | ORAL_TABLET | Freq: Four times a day (QID) | ORAL | Status: DC | PRN

## 2014-03-25 MED ORDER — OXYCODONE-ACETAMINOPHEN 10-325 MG PO TABS
1.0000 | ORAL_TABLET | Freq: Four times a day (QID) | ORAL | Status: DC | PRN
Start: 2014-03-25 — End: 2014-03-25

## 2014-03-25 NOTE — Patient Instructions (Signed)
We will call with your lab results. I will try to get you to see Dr. Bridgett Larsson here in [place] since you can't get to [facility].

## 2014-03-26 ENCOUNTER — Telehealth: Payer: Self-pay | Admitting: *Deleted

## 2014-03-26 NOTE — Telephone Encounter (Signed)
-----   Message from Frazier Richards, MD sent at 03/26/2014  1:46 PM EST ----- Please tell [name] that her thyroid and blood sugar levels are normal. We do not need to change her thyroid medication dose and she still doesn't have diabetes.  Thanks!

## 2014-03-26 NOTE — Telephone Encounter (Signed)
Pt informed. ,Delray Alt, CMA

## 2014-04-08 NOTE — Assessment & Plan Note (Signed)
Recent dose adjustment - recheck TSH today - 0.507 - continue current synthroid dose of 16mcg daily

## 2014-04-08 NOTE — Progress Notes (Signed)
   Subjective:    Patient ID: [name], female    DOB: [birth year], 49 y.o.   MRN: [id number]  HPI Pt presents for f/u of lymphedema. She reports that she has significant pain and swelling of bilateral legs and has been referred to vascular in [place] but can't get there due to transportation problems. She needs to re-establish with vascular in town.   She is also concerned that her thyroid level may be off as we have recently changed her dose and she wants this checked.   Review of Systems  Constitutional: Positive for fatigue.  Respiratory: Negative for shortness of breath.   Cardiovascular: Positive for leg swelling. Negative for chest pain.  Musculoskeletal: Positive for myalgias and arthralgias.  Neurological: Positive for light-headedness.  All other systems reviewed and are negative.      Objective:   Physical Exam  Constitutional: She is oriented to person, place, and time. She appears well-developed and well-nourished. No distress.  HENT:  Head: Normocephalic and atraumatic.  Eyes: Conjunctivae are normal. Right eye exhibits no discharge. Left eye exhibits no discharge. No scleral icterus.  Neck: Normal range of motion. Neck supple. No thyromegaly present.  Cardiovascular: Normal rate, regular rhythm and normal heart sounds.   No murmur heard. Pulmonary/Chest: Effort normal and breath sounds normal. No respiratory distress. She has no wheezes.  Abdominal: Soft. Bowel sounds are normal. She exhibits no distension. There is no tenderness.  Musculoskeletal: She exhibits no edema.  Severe lymphedema of bilateral legs  Lymphadenopathy:    She has no cervical adenopathy.  Neurological: She is alert and oriented to person, place, and time.  Skin: Skin is warm and dry. She is not diaphoretic.  Dehydratic eczema of bilateral palms  Psychiatric: She has a normal mood and affect. Her behavior is normal.  Nursing note and vitals reviewed.         Assessment & Plan:

## 2014-04-08 NOTE — Assessment & Plan Note (Signed)
Congenital bilateral LE lymphedema - cannot get to [place] for vascular follow - refer to reestablish with Bridgett Larsson

## 2014-04-10 ENCOUNTER — Telehealth: Payer: Self-pay | Admitting: Family Medicine

## 2014-04-10 NOTE — Telephone Encounter (Signed)
[facility] called because we referred the patient there. They would like [place] to fax over records and notes on the patient concerning any of her vascular issues. They are seeing her for her Lymphedema. Please fax this to [phone]. Blima Rich

## 2014-04-11 NOTE — Telephone Encounter (Signed)
Notes faxed to number provided.

## 2014-04-16 ENCOUNTER — Other Ambulatory Visit: Payer: Self-pay | Admitting: *Deleted

## 2014-04-16 DIAGNOSIS — M7989 Other specified soft tissue disorders: Secondary | ICD-10-CM

## 2014-04-16 DIAGNOSIS — I890 Lymphedema, not elsewhere classified: Secondary | ICD-10-CM

## 2014-04-29 ENCOUNTER — Telehealth: Payer: Self-pay | Admitting: Vascular Surgery

## 2014-04-29 NOTE — Telephone Encounter (Signed)
Spoke with pt- canceled appt, dpm

## 2014-04-29 NOTE — Telephone Encounter (Signed)
I left a message for [name] to call back. We will need to cancel her appt on 02/26 and refer her to a lymphedema clinic in [place]. I did not cancel her appt as I have not spoke with her yet.

## 2014-04-29 NOTE — Telephone Encounter (Signed)
-----   Message from Mena Goes, RN sent at 04/25/2014  5:20 PM EST ----- Regarding: Schedule   ----- Message -----    From: Conrad Bostonia, MD    Sent: 04/25/2014   2:40 PM      To: Vvs Charge [address]  HUDSON LEHMKUHL [id number] May 09, 1965  If this patient is coming back to see [place] for her leg swelling, you can cancel the consult as we have nothing more to offer for her lymphedema.

## 2014-05-03 ENCOUNTER — Ambulatory Visit: Payer: Medicaid Other | Admitting: Vascular Surgery

## 2014-05-03 ENCOUNTER — Encounter (HOSPITAL_COMMUNITY): Payer: Medicaid Other

## 2014-05-20 ENCOUNTER — Telehealth: Payer: Self-pay | Admitting: Family Medicine

## 2014-05-20 NOTE — Telephone Encounter (Signed)
Would like to see if she can be referred back to gastrologist about an ulcer, pain in stomach unbearable and can't wait to see Dr. Sherril Cong (next available 4/8), would like for PCP to contact her / sr

## 2014-05-23 ENCOUNTER — Encounter: Payer: Self-pay | Admitting: Internal Medicine

## 2014-05-24 ENCOUNTER — Other Ambulatory Visit: Payer: Self-pay | Admitting: Family Medicine

## 2014-05-24 DIAGNOSIS — R1013 Epigastric pain: Principal | ICD-10-CM

## 2014-05-24 DIAGNOSIS — G8929 Other chronic pain: Secondary | ICD-10-CM

## 2014-05-24 NOTE — Telephone Encounter (Signed)
In addition to the referral for endoscopy, pt also want a colonoscopy.  Would like to have an appt with provider before procedures.  Please contact patient to discuss.

## 2014-05-24 NOTE — Telephone Encounter (Signed)
Referral to GI entered. Please make the appointment (ASAP) and let her know when it is. It will be for consult, they can decide whether endoscopy from either end is indicated.

## 2014-05-24 NOTE — Telephone Encounter (Signed)
Referral placed in [place] workqueue, they will contact patient with appointment.

## 2014-06-06 ENCOUNTER — Encounter: Payer: Self-pay | Admitting: Nurse Practitioner

## 2014-06-06 ENCOUNTER — Ambulatory Visit: Payer: Medicaid Other | Admitting: Nurse Practitioner

## 2014-06-06 ENCOUNTER — Ambulatory Visit (INDEPENDENT_AMBULATORY_CARE_PROVIDER_SITE_OTHER): Payer: Medicaid Other | Admitting: Nurse Practitioner

## 2014-06-06 VITALS — BP 126/76 | HR 64 | Ht 67.5 in | Wt 232.5 lb

## 2014-06-06 DIAGNOSIS — Z860100 Personal history of colon polyps, unspecified: Secondary | ICD-10-CM

## 2014-06-06 DIAGNOSIS — R1031 Right lower quadrant pain: Secondary | ICD-10-CM

## 2014-06-06 DIAGNOSIS — R112 Nausea with vomiting, unspecified: Secondary | ICD-10-CM

## 2014-06-06 DIAGNOSIS — R102 Pelvic and perineal pain unspecified side: Secondary | ICD-10-CM

## 2014-06-06 DIAGNOSIS — G8929 Other chronic pain: Secondary | ICD-10-CM

## 2014-06-06 MED ORDER — NA SULFATE-K SULFATE-MG SULF 17.5-3.13-1.6 GM/177ML PO SOLN: ORAL | Status: DC

## 2014-06-06 NOTE — Patient Instructions (Signed)
You can get Align at any pharmacy, [facility], [place]. , [facility].  We have given you a coupon.  Take 1 cap daily for 1 month.  You have been scheduled for a colonoscopy. Please follow written instructions given to you at your visit today.  We have given you a free colonoscopy prep.  If you use inhalers (even only as needed), please bring them with you on the day of your procedure. Your physician has requested that you go to www.startemmi.com and enter the access code given to you at your visit today. This web site gives a general overview about your procedure. However, you should still follow specific instructions given to you by our office regarding your preparation for the procedure.
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
## 2014-06-07 ENCOUNTER — Other Ambulatory Visit: Payer: Medicaid Other

## 2014-06-12 ENCOUNTER — Telehealth: Payer: Self-pay

## 2014-06-12 ENCOUNTER — Encounter: Payer: Self-pay | Admitting: Internal Medicine

## 2014-06-12 ENCOUNTER — Ambulatory Visit (AMBULATORY_SURGERY_CENTER): Payer: Medicaid Other | Admitting: Internal Medicine

## 2014-06-12 VITALS — BP 123/84 | HR 55 | Temp 98.0°F | Resp 18 | Ht 67.5 in | Wt 232.0 lb

## 2014-06-12 DIAGNOSIS — K635 Polyp of colon: Secondary | ICD-10-CM | POA: Diagnosis not present

## 2014-06-12 DIAGNOSIS — D125 Benign neoplasm of sigmoid colon: Secondary | ICD-10-CM

## 2014-06-12 DIAGNOSIS — D124 Benign neoplasm of descending colon: Secondary | ICD-10-CM

## 2014-06-12 DIAGNOSIS — Z86010 Personal history of colonic polyps: Secondary | ICD-10-CM | POA: Diagnosis not present

## 2014-06-12 MED ORDER — SODIUM CHLORIDE 0.9 % IV SOLN: 500.0000 mL | INTRAVENOUS | Status: DC

## 2014-06-12 NOTE — Op Note (Signed)
[place]  [address] [place], [id number]   COLONOSCOPY PROCEDURE REPORT  PATIENT: [name], [name]  MR#: [id number] BIRTHDATE: November 24, 1965 , 49  yrs. old GENDER: female ENDOSCOPIST: Jerene Bears, MD PROCEDURE DATE:  06/12/2014 PROCEDURE:   Colonoscopy, surveillance and Colonoscopy with snare polypectomy First Screening Colonoscopy - Avg.  risk and is 50 yrs.  old or older - No.  Prior Negative Screening - Now for repeat screening. N/A  History of Adenoma - Now for follow-up colonoscopy & has been > or = to 3 yrs.  Yes hx of adenoma.  Has been 3 or more years since last colonoscopy. ASA CLASS:   Class II INDICATIONS:Surveillance due to prior colonic neoplasia and PH Colon Adenoma, RLQ pain, bloating MEDICATIONS: Monitored anesthesia care and Propofol 400 mg IV  DESCRIPTION OF PROCEDURE:   After the risks benefits and alternatives of the procedure were thoroughly explained, informed consent was obtained.  The digital rectal exam revealed no rectal mass.   The LB [id number]-[id number]  endoscope was introduced through the anus and advanced to the terminal ileum which was intubated for a short distance. No adverse events experienced.   The quality of the prep was (MoviPrep was used) good.  The instrument was then slowly withdrawn as the colon was fully examined.  COLON FINDINGS: The examined terminal ileum appeared to be normal. Four sessile polyps ranging from 3 to 79mm in size were found in the descending colon (2) and sigmoid colon (2).  Polypectomies were performed with a cold snare.  The resection was complete, the polyp tissue was completely retrieved and sent to histology.   The examination was otherwise normal.  Retroflexed views revealed internal hemorrhoids. The time to cecum = 2.3 Withdrawal time = 14.8   The scope was withdrawn and the procedure completed. COMPLICATIONS: There were no immediate complications.  ENDOSCOPIC IMPRESSION: 1.   The examined  terminal ileum appeared to be normal 2.   Four sessile polyps ranging from 3 to 71mm in size were found in the descending colon and sigmoid colon; polypectomies were performed with a cold snare 3.   The examination was otherwise normal  RECOMMENDATIONS: 1.  Await pathology results 2.  Proceed with CT scan abd/pelvis as ordered. 3.  If negative consider course of rifaximin for IBS 4.  Timing of repeat colonoscopy will be determined by pathology findings. 5.  You will receive a letter within 1-2 weeks with the results of your biopsy as well as final recommendations.  Please call my office if you have not received a letter after 3 weeks.  eSigned:  Jerene Bears, MD 06/12/2014 2:56 PM  cc: the patient, PCP

## 2014-06-12 NOTE — Telephone Encounter (Signed)
Pt scheduled for CT of A/P at [place] [facility] 06/18/14@2pm , pt to arrive there at 1:45pm. Pt to be NPO after 10am, drink bottle 1 of contrast at 12noon and bottle 2 at 1pm. [facility] RN to notify pt of appt.

## 2014-06-12 NOTE — Progress Notes (Signed)
Patient awakening,vss,report to rn 

## 2014-06-12 NOTE — Patient Instructions (Signed)
YOU HAD AN ENDOSCOPIC PROCEDURE TODAY AT [facility]:   Refer to the procedure report that was given to you for any specific questions about what was found during the examination.  If the procedure report does not answer your questions, please call your gastroenterologist to clarify.  If you requested that your care partner not be given the details of your procedure findings, then the procedure report has been included in a sealed envelope for you to review at your convenience later.  YOU SHOULD EXPECT: Some feelings of bloating in the abdomen. Passage of more gas than usual.  Walking can help get rid of the air that was put into your GI tract during the procedure and reduce the bloating. If you had a lower endoscopy (such as a colonoscopy or flexible sigmoidoscopy) you may notice spotting of blood in your stool or on the toilet paper. If you underwent a bowel prep for your procedure, you may not have a normal bowel movement for a few days.  Please Note:  You might notice some irritation and congestion in your nose or some drainage.  This is from the oxygen used during your procedure.  There is no need for concern and it should clear up in a day or so.  SYMPTOMS TO REPORT IMMEDIATELY:   Following lower endoscopy (colonoscopy or flexible sigmoidoscopy):  Excessive amounts of blood in the stool  Significant tenderness or worsening of abdominal pains  Swelling of the abdomen that is new, acute  Fever of 100F or higher   For urgent or emergent issues, a gastroenterologist can be reached at any hour by calling [phone].   DIET: Your first meal following the procedure should be a small meal and then it is ok to progress to your normal diet. Heavy or fried foods are harder to digest and may make you feel nauseous or bloated.  Likewise, meals heavy in dairy and vegetables can increase bloating.  Drink plenty of fluids but you should avoid alcoholic beverages for 24  hours.  ACTIVITY:  You should plan to take it easy for the rest of today and you should NOT DRIVE or use heavy machinery until tomorrow (because of the sedation medicines used during the test).    FOLLOW UP: Our staff will call the number listed on your records the next business day following your procedure to check on you and address any questions or concerns that you may have regarding the information given to you following your procedure. If we do not reach you, we will leave a message.  However, if you are feeling well and you are not experiencing any problems, there is no need to return our call.  We will assume that you have returned to your regular daily activities without incident.  If any biopsies were taken you will be contacted by phone or by letter within the next 1-3 weeks.  Please call us at [phone] if you have not heard about the biopsies in 3 weeks.    SIGNATURES/CONFIDENTIALITY: You and/or your care partner have signed paperwork which will be entered into your electronic medical record.  These signatures attest to the fact that that the information above on your After Visit Summary has been reviewed and is understood.  Full responsibility of the confidentiality of this discharge information lies with you and/or your care-partner.  Polyps-handouts given  Repeat colonoscopy will be determined by pathology.

## 2014-06-12 NOTE — Progress Notes (Signed)
Called to room to assist during endoscopic procedure.  Patient ID and intended procedure confirmed with present staff. Received instructions for my participation in the procedure from the performing physician.  

## 2014-06-13 ENCOUNTER — Telehealth: Payer: Self-pay | Admitting: *Deleted

## 2014-06-13 NOTE — Telephone Encounter (Signed)
  Follow up Call-  Call back number 06/12/2014  Post procedure Call Back phone  # [phone]  Permission to leave phone message Yes     No answer and answering machine did not pick up to leave a message.

## 2014-06-14 ENCOUNTER — Encounter: Payer: Self-pay | Admitting: Family Medicine

## 2014-06-14 ENCOUNTER — Ambulatory Visit (INDEPENDENT_AMBULATORY_CARE_PROVIDER_SITE_OTHER): Payer: Medicaid Other | Admitting: Family Medicine

## 2014-06-14 VITALS — BP 141/89 | HR 58 | Temp 98.0°F | Ht 68.0 in | Wt 229.0 lb

## 2014-06-14 DIAGNOSIS — I890 Lymphedema, not elsewhere classified: Secondary | ICD-10-CM

## 2014-06-14 DIAGNOSIS — R21 Rash and other nonspecific skin eruption: Secondary | ICD-10-CM | POA: Diagnosis not present

## 2014-06-14 DIAGNOSIS — F411 Generalized anxiety disorder: Secondary | ICD-10-CM | POA: Diagnosis not present

## 2014-06-14 DIAGNOSIS — K219 Gastro-esophageal reflux disease without esophagitis: Secondary | ICD-10-CM | POA: Diagnosis not present

## 2014-06-14 DIAGNOSIS — L301 Dyshidrosis [pompholyx]: Secondary | ICD-10-CM | POA: Diagnosis not present

## 2014-06-14 DIAGNOSIS — G8929 Other chronic pain: Secondary | ICD-10-CM

## 2014-06-14 DIAGNOSIS — R109 Unspecified abdominal pain: Secondary | ICD-10-CM

## 2014-06-14 DIAGNOSIS — F418 Other specified anxiety disorders: Secondary | ICD-10-CM | POA: Diagnosis not present

## 2014-06-14 DIAGNOSIS — J449 Chronic obstructive pulmonary disease, unspecified: Secondary | ICD-10-CM | POA: Diagnosis not present

## 2014-06-14 DIAGNOSIS — E039 Hypothyroidism, unspecified: Secondary | ICD-10-CM | POA: Diagnosis not present

## 2014-06-14 MED ORDER — ONDANSETRON HCL 4 MG PO TABS: 4.0000 mg | ORAL_TABLET | Freq: Three times a day (TID) | ORAL | Status: DC | PRN

## 2014-06-14 MED ORDER — OXYCODONE-ACETAMINOPHEN 10-325 MG PO TABS: 1.0000 | ORAL_TABLET | Freq: Four times a day (QID) | ORAL | Status: DC | PRN

## 2014-06-14 MED ORDER — PREGABALIN 150 MG PO CAPS: 150.0000 mg | ORAL_CAPSULE | Freq: Two times a day (BID) | ORAL | Status: DC

## 2014-06-14 NOTE — Patient Instructions (Signed)
I will await the results of the CT scan and recommendations from Dr. Hilarie Fredrickson. If she does not think there is an intestinal cause for your pain then we should consider referral to PM&R for possible cortisol injection if we think this pain may be coming from your back.

## 2014-06-18 ENCOUNTER — Ambulatory Visit (INDEPENDENT_AMBULATORY_CARE_PROVIDER_SITE_OTHER)
Admission: RE | Admit: 2014-06-18 | Discharge: 2014-06-18 | Disposition: A | Discharge status: Home / Self Care | Payer: Medicaid Other | Source: Ambulatory Visit | Attending: Nurse Practitioner | Admitting: Nurse Practitioner

## 2014-06-18 DIAGNOSIS — G8929 Other chronic pain: Secondary | ICD-10-CM

## 2014-06-18 DIAGNOSIS — Z86010 Personal history of colonic polyps: Secondary | ICD-10-CM | POA: Diagnosis not present

## 2014-06-18 DIAGNOSIS — R102 Pelvic and perineal pain: Secondary | ICD-10-CM

## 2014-06-18 DIAGNOSIS — R1031 Right lower quadrant pain: Secondary | ICD-10-CM | POA: Diagnosis not present

## 2014-06-18 MED ORDER — IOHEXOL 300 MG/ML  SOLN
100.0000 mL | Freq: Once | INTRAMUSCULAR | Status: AC | PRN
  Administered 2014-06-18: 100 mL via INTRAVENOUS

## 2014-06-19 ENCOUNTER — Telehealth: Payer: Self-pay | Admitting: Family Medicine

## 2014-06-19 NOTE — Telephone Encounter (Signed)
Pt would like to speak with her doctor re: test results from the gastroenterologist

## 2014-06-21 NOTE — Telephone Encounter (Signed)
That last message was an error, please ignore

## 2014-06-21 NOTE — Telephone Encounter (Signed)
RX placed up front for patient pick up. Please let her know.

## 2014-06-24 ENCOUNTER — Other Ambulatory Visit: Payer: Self-pay | Admitting: Family Medicine

## 2014-06-24 ENCOUNTER — Encounter: Payer: Self-pay | Admitting: Internal Medicine

## 2014-06-24 DIAGNOSIS — M5126 Other intervertebral disc displacement, lumbar region: Secondary | ICD-10-CM

## 2014-06-24 DIAGNOSIS — R109 Unspecified abdominal pain: Secondary | ICD-10-CM

## 2014-06-24 DIAGNOSIS — G8929 Other chronic pain: Secondary | ICD-10-CM

## 2014-06-24 NOTE — Telephone Encounter (Signed)
Please inform that patient that her CT scan was normal and shows nothing to explain her pain so I have referred her to PM&R for a possible spinal injection and to help determine if it may be coming from her back as we discussed at her last visit.  Thanks!

## 2014-06-24 NOTE — Progress Notes (Signed)
   Subjective:    Patient ID: [name], female    DOB: Apr 17, 1965, 49 y.o.   MRN: [id number]  HPI Pt presents for f/u of chronic abdominal pain. Reports it is stable. Recently saw GI and had a colonoscopy with polyps but they report no GI cause for her pain that they could find. Wonders if it may be from lumbar spinal degeneration. Wants to wait for CT from GI before pursuing work-up of alternate etiologies.    Review of Systems No n/v/d, fevers. No saddle anesthesia, loss of bowel or bladder control.    Objective:   Physical Exam  Constitutional: She is oriented to person, place, and time. She appears well-developed and well-nourished. No distress.  HENT:  Head: Normocephalic and atraumatic.  Eyes: Conjunctivae are normal. Right eye exhibits no discharge. Left eye exhibits no discharge.  Cardiovascular: Normal rate.   Pulmonary/Chest: Effort normal. No respiratory distress.  Abdominal: Soft. She exhibits no distension. There is no tenderness.  Musculoskeletal:  Severe bilateral LE lymphedema  Neurological: She is alert and oriented to person, place, and time.  Skin: Skin is warm and dry. No rash noted. She is not diaphoretic.  Psychiatric: Her behavior is normal.  Somewhat flat affect  Nursing note and vitals reviewed.         Assessment & Plan:

## 2014-06-24 NOTE — Telephone Encounter (Signed)
Tried calling number provided, line is disconnected.

## 2014-06-24 NOTE — Assessment & Plan Note (Addendum)
Recently saw GI which finds no cause for her abdominal pain but worries that it could be coming from her L-spine - await CT scan - consider referral to PM&R for possible injection - refill percocet and lyrica

## 2014-06-25 NOTE — Telephone Encounter (Signed)
Pt is returning call, says she would like Dr. Sherril Cong to call her. Says the number should be fine.

## 2014-06-26 ENCOUNTER — Telehealth: Payer: Self-pay | Admitting: Family Medicine

## 2014-06-26 NOTE — Telephone Encounter (Signed)
Calling again to inquire why provider haven't contacted her yet.

## 2014-06-26 NOTE — Telephone Encounter (Signed)
Ms. Rule called to say that the appt for [facility] was going to be a couple months out, but she is in need of some relief now.  Need to speak with provider about coming in to office and receiving a cortisone injection until she can get an appt with them.

## 2014-06-26 NOTE — Telephone Encounter (Signed)
Left voice message stating a cortisone injection for back pain can not be done at [facility] per Dr. Sherril Cong.  Dr. Sherril Cong has already given patient pain medication.  Pt would have to wait until [facility] appt.  [facility] using x-ray to help guide the injection.  The risk is very high during cortisone injection at [facility].  Pt to call back if she has questions.  Derl Barrow, RN

## 2014-07-01 ENCOUNTER — Telehealth: Payer: Self-pay | Admitting: Family Medicine

## 2014-07-01 NOTE — Telephone Encounter (Signed)
[name] would like to have provider call her back.  Have more questions to ask regarding the injection.  Never received voice msg because the incorrect number was sent.

## 2014-07-10 ENCOUNTER — Telehealth: Payer: Self-pay | Admitting: Family Medicine

## 2014-07-10 NOTE — Telephone Encounter (Signed)
Pt wants PCP to know that she does not want to take medication, she only wants the Cortizone shot, she says that [facility] stated that the PCP needs to call and specify that the Cortizone shot is needed and is needed sooner than 4-6 weeks. Please call patient if further info is needed and to let her know it is complete. Thanks [employer], ASA

## 2014-07-12 ENCOUNTER — Other Ambulatory Visit: Payer: Self-pay | Admitting: Family Medicine

## 2014-07-12 DIAGNOSIS — R200 Anesthesia of skin: Secondary | ICD-10-CM

## 2014-07-12 DIAGNOSIS — M5126 Other intervertebral disc displacement, lumbar region: Secondary | ICD-10-CM

## 2014-07-12 DIAGNOSIS — R202 Paresthesia of skin: Secondary | ICD-10-CM

## 2014-07-12 NOTE — Telephone Encounter (Signed)
The PM&R people will want to evaluate her before they can do the shot and they may need an MRI to know exactly where the cortisone needs to go. I am ordering the MRI so it can hopefully get done more quickly and they can have it to review when she sees them. Thanks

## 2014-07-15 NOTE — Telephone Encounter (Signed)
Patient calling to check on the status of her phone call last week. I informed her of the message below, and she would like to know when and where the MRI will be. The patient would like to know if she will receive a phone call about this info. She would like to have the MRI done before her next scheduled appt with her PCP which is on Friday, 07/19/2014. Thanks, [employer], ASA

## 2014-07-15 NOTE — Telephone Encounter (Signed)
Patient informed that MRI is scheduled for 07/25/14 at 3pm at [facility] in the radiology department. Reminder letter mailed per patient request.

## 2014-07-19 ENCOUNTER — Encounter: Payer: Self-pay | Admitting: Family Medicine

## 2014-07-19 ENCOUNTER — Ambulatory Visit (INDEPENDENT_AMBULATORY_CARE_PROVIDER_SITE_OTHER): Payer: Medicaid Other | Admitting: Family Medicine

## 2014-07-19 VITALS — BP 116/83 | HR 77 | Temp 98.4°F | Ht 68.0 in | Wt 231.0 lb

## 2014-07-19 DIAGNOSIS — K219 Gastro-esophageal reflux disease without esophagitis: Secondary | ICD-10-CM | POA: Diagnosis not present

## 2014-07-19 DIAGNOSIS — F411 Generalized anxiety disorder: Secondary | ICD-10-CM | POA: Diagnosis not present

## 2014-07-19 DIAGNOSIS — R1011 Right upper quadrant pain: Secondary | ICD-10-CM

## 2014-07-19 DIAGNOSIS — M5126 Other intervertebral disc displacement, lumbar region: Secondary | ICD-10-CM

## 2014-07-19 DIAGNOSIS — L301 Dyshidrosis [pompholyx]: Secondary | ICD-10-CM | POA: Diagnosis not present

## 2014-07-19 DIAGNOSIS — F418 Other specified anxiety disorders: Secondary | ICD-10-CM | POA: Diagnosis not present

## 2014-07-19 DIAGNOSIS — G8929 Other chronic pain: Secondary | ICD-10-CM | POA: Diagnosis not present

## 2014-07-19 DIAGNOSIS — R21 Rash and other nonspecific skin eruption: Secondary | ICD-10-CM | POA: Diagnosis present

## 2014-07-19 DIAGNOSIS — I890 Lymphedema, not elsewhere classified: Secondary | ICD-10-CM

## 2014-07-19 DIAGNOSIS — R109 Unspecified abdominal pain: Secondary | ICD-10-CM | POA: Diagnosis not present

## 2014-07-19 DIAGNOSIS — E039 Hypothyroidism, unspecified: Secondary | ICD-10-CM | POA: Diagnosis not present

## 2014-07-19 DIAGNOSIS — J449 Chronic obstructive pulmonary disease, unspecified: Secondary | ICD-10-CM | POA: Diagnosis not present

## 2014-07-19 LAB — CBC
HCT: 40.7 % (ref 36.0–46.0)
HEMOGLOBIN: 13.9 g/dL (ref 12.0–15.0)
MCH: 34.2 pg — ABNORMAL HIGH (ref 26.0–34.0)
MCHC: 34.2 g/dL (ref 30.0–36.0)
MCV: 100 fL (ref 78.0–100.0)
MPV: 9.5 fL (ref 8.6–12.4)
Platelets: 290 10*3/uL (ref 150–400)
RBC: 4.07 MIL/uL (ref 3.87–5.11)
RDW: 12.8 % (ref 11.5–15.5)
WBC: 4.3 10*3/uL (ref 4.0–10.5)

## 2014-07-19 LAB — COMPREHENSIVE METABOLIC PANEL
ALBUMIN: 3.7 g/dL (ref 3.5–5.2)
ALT: 9 U/L (ref 0–35)
AST: 11 U/L (ref 0–37)
Alkaline Phosphatase: 65 U/L (ref 39–117)
BILIRUBIN TOTAL: 0.4 mg/dL (ref 0.2–1.2)
BUN: 12 mg/dL (ref 6–23)
CO2: 25 meq/L (ref 19–32)
Calcium: 8.8 mg/dL (ref 8.4–10.5)
Chloride: 103 mEq/L (ref 96–112)
Creat: 0.84 mg/dL (ref 0.50–1.10)
Glucose, Bld: 92 mg/dL (ref 70–99)
Potassium: 4.4 mEq/L (ref 3.5–5.3)
Sodium: 139 mEq/L (ref 135–145)
Total Protein: 6.6 g/dL (ref 6.0–8.3)

## 2014-07-19 MED ORDER — OXYCODONE-ACETAMINOPHEN 10-325 MG PO TABS: 1.0000 | ORAL_TABLET | Freq: Four times a day (QID) | ORAL | Status: DC | PRN

## 2014-07-19 MED ORDER — PREGABALIN 150 MG PO CAPS: 150.0000 mg | ORAL_CAPSULE | Freq: Two times a day (BID) | ORAL | Status: DC

## 2014-07-19 MED ORDER — KETOROLAC TROMETHAMINE 60 MG/2ML IM SOLN
60.0000 mg | Freq: Once | INTRAMUSCULAR | Status: AC
  Administered 2014-07-19: 60 mg via INTRAMUSCULAR

## 2014-07-19 MED ORDER — LORAZEPAM 2 MG PO TABS: 4.0000 mg | ORAL_TABLET | Freq: Two times a day (BID) | ORAL | Status: DC | PRN

## 2014-07-19 NOTE — Patient Instructions (Signed)
I will call with the results of your MRI next week. I will also specify cortisone injection on your Physical Medicine referral.

## 2014-07-23 ENCOUNTER — Encounter: Payer: Self-pay | Admitting: *Deleted

## 2014-07-25 ENCOUNTER — Ambulatory Visit (HOSPITAL_COMMUNITY): Admission: RE | Admit: 2014-07-25 | Payer: Medicaid Other | Source: Ambulatory Visit

## 2014-07-26 ENCOUNTER — Ambulatory Visit (HOSPITAL_COMMUNITY)
Admission: RE | Admit: 2014-07-26 | Discharge: 2014-07-26 | Disposition: A | Discharge status: Home / Self Care | Payer: Medicaid Other | Source: Ambulatory Visit | Attending: Family Medicine | Admitting: Family Medicine

## 2014-07-26 DIAGNOSIS — G8929 Other chronic pain: Secondary | ICD-10-CM | POA: Insufficient documentation

## 2014-07-26 DIAGNOSIS — M5126 Other intervertebral disc displacement, lumbar region: Secondary | ICD-10-CM | POA: Diagnosis not present

## 2014-07-26 DIAGNOSIS — M5116 Intervertebral disc disorders with radiculopathy, lumbar region: Secondary | ICD-10-CM | POA: Insufficient documentation

## 2014-07-30 NOTE — Progress Notes (Signed)
   Subjective:    Patient ID: [name], female    DOB: 07/01/1965, 49 y.o.   MRN: [id number]  HPI Pt presents for f/u of chronic abdominal/lumbar pain. Abdominal pain has been worked up by GI without any etiology identified although they suggested it may be coming from lumbar disk disease. She does also have back pain but primarily localizes her pain to a very specific spot in the RLQ. She reports that her pain has recently become more severe and she has been staying in bed a lot because of it.   Review of Systems  Constitutional: Negative for fever and chills.  Respiratory: Negative for shortness of breath.   Cardiovascular: Positive for leg swelling. Negative for chest pain.  Gastrointestinal: Positive for abdominal pain. Negative for nausea, vomiting, diarrhea, constipation, blood in stool and abdominal distention.  Musculoskeletal: Positive for back pain, arthralgias and gait problem.       Objective:   Physical Exam  Constitutional: She is oriented to person, place, and time. She appears well-developed and well-nourished. No distress.  HENT:  Head: Normocephalic and atraumatic.  Eyes: Conjunctivae are normal. Right eye exhibits no discharge. Left eye exhibits no discharge.  Cardiovascular: Normal rate, regular rhythm and normal heart sounds.   No murmur heard. Pulmonary/Chest: Effort normal and breath sounds normal. No respiratory distress. She has no wheezes.  Abdominal: Soft. Bowel sounds are normal. She exhibits no distension and no mass. There is tenderness in the right lower quadrant. There is no rebound, no guarding and no CVA tenderness.    Musculoskeletal: She exhibits edema.  Severe bilateral lymphedema  Neurological: She is alert and oriented to person, place, and time.  Skin: Skin is warm and dry. She is not diaphoretic.  Psychiatric: She has a normal mood and affect. Her behavior is normal.  Nursing note and vitals reviewed.         Assessment & Plan:

## 2014-07-30 NOTE — Assessment & Plan Note (Addendum)
Chronic abdominal pain, has been worked up by GI who feel this is not gastrointestinal. 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  - no spinal imaging for years, lumbar MRI scheduled prior to PM&R appointment to aid them in targeted injections - is worsening but not concerned for acute issue given this has been going on for many years and she has had negative CT scan and endoscopies.

## 2014-07-31 ENCOUNTER — Telehealth: Payer: Self-pay | Admitting: Family Medicine

## 2014-07-31 NOTE — Telephone Encounter (Signed)
Wants to Dr Sherril Cong personally about her MRI results, She knows she will have questions that only dr can answer She is waiting on the results so she can schedule a cortisone shot

## 2014-08-01 NOTE — Telephone Encounter (Signed)
Discussed results with patient. Would like to proceed with cortisone injection of affected nerves (right L4 and S1). Called PM&R and was told that clinic needed to call for early appt otherwise it will be 4-6 weeks. Please push for asap since she is in severe pain. Thanks!

## 2014-08-27 ENCOUNTER — Ambulatory Visit: Payer: Medicaid Other | Admitting: Physical Medicine & Rehabilitation

## 2014-09-10 ENCOUNTER — Encounter: Payer: Self-pay | Admitting: Physical Medicine & Rehabilitation

## 2014-09-23 ENCOUNTER — Encounter: Payer: Self-pay | Admitting: Physical Medicine & Rehabilitation

## 2014-09-23 ENCOUNTER — Encounter: Payer: Medicaid Other | Attending: Physical Medicine & Rehabilitation

## 2014-09-23 ENCOUNTER — Ambulatory Visit (HOSPITAL_BASED_OUTPATIENT_CLINIC_OR_DEPARTMENT_OTHER): Payer: Medicaid Other | Admitting: Physical Medicine & Rehabilitation

## 2014-09-23 VITALS — BP 126/87 | HR 77 | Resp 14

## 2014-09-23 DIAGNOSIS — Z833 Family history of diabetes mellitus: Secondary | ICD-10-CM | POA: Diagnosis not present

## 2014-09-23 DIAGNOSIS — E785 Hyperlipidemia, unspecified: Secondary | ICD-10-CM | POA: Diagnosis not present

## 2014-09-23 DIAGNOSIS — M1990 Unspecified osteoarthritis, unspecified site: Secondary | ICD-10-CM | POA: Diagnosis not present

## 2014-09-23 DIAGNOSIS — Z86010 Personal history of colonic polyps: Secondary | ICD-10-CM | POA: Insufficient documentation

## 2014-09-23 DIAGNOSIS — M5126 Other intervertebral disc displacement, lumbar region: Secondary | ICD-10-CM | POA: Diagnosis not present

## 2014-09-23 DIAGNOSIS — J449 Chronic obstructive pulmonary disease, unspecified: Secondary | ICD-10-CM | POA: Insufficient documentation

## 2014-09-23 DIAGNOSIS — M479 Spondylosis, unspecified: Secondary | ICD-10-CM | POA: Insufficient documentation

## 2014-09-23 DIAGNOSIS — Z8249 Family history of ischemic heart disease and other diseases of the circulatory system: Secondary | ICD-10-CM | POA: Diagnosis not present

## 2014-09-23 DIAGNOSIS — M545 Low back pain: Secondary | ICD-10-CM | POA: Diagnosis present

## 2014-09-23 DIAGNOSIS — Z87891 Personal history of nicotine dependence: Secondary | ICD-10-CM | POA: Insufficient documentation

## 2014-09-23 DIAGNOSIS — K219 Gastro-esophageal reflux disease without esophagitis: Secondary | ICD-10-CM | POA: Insufficient documentation

## 2014-09-23 DIAGNOSIS — M4726 Other spondylosis with radiculopathy, lumbar region: Secondary | ICD-10-CM | POA: Diagnosis not present
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
## 2014-09-23 NOTE — Patient Instructions (Signed)
You will need a driver for the next injection

## 2014-10-14 ENCOUNTER — Ambulatory Visit: Payer: Medicaid Other | Admitting: Family Medicine

## 2014-10-14 ENCOUNTER — Other Ambulatory Visit: Payer: Self-pay | Admitting: Family Medicine

## 2014-10-14 DIAGNOSIS — I890 Lymphedema, not elsewhere classified: Secondary | ICD-10-CM

## 2014-10-14 DIAGNOSIS — E038 Other specified hypothyroidism: Secondary | ICD-10-CM

## 2014-10-14 DIAGNOSIS — F411 Generalized anxiety disorder: Secondary | ICD-10-CM

## 2014-10-14 DIAGNOSIS — J449 Chronic obstructive pulmonary disease, unspecified: Secondary | ICD-10-CM
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
## 2014-10-17 MED ORDER — LEVOTHYROXINE SODIUM 175 MCG PO TABS: 175.0000 ug | ORAL_TABLET | Freq: Every day | ORAL | Status: DC

## 2014-10-17 MED ORDER — ALBUTEROL SULFATE HFA 108 (90 BASE) MCG/ACT IN AERS: 2.0000 | INHALATION_SPRAY | RESPIRATORY_TRACT | Status: DC | PRN

## 2014-10-17 MED ORDER — FLUTICASONE-SALMETEROL 250-50 MCG/DOSE IN AEPB: 1.0000 | INHALATION_SPRAY | Freq: Two times a day (BID) | RESPIRATORY_TRACT | Status: DC

## 2014-10-17 MED ORDER — OXYCODONE-ACETAMINOPHEN 10-325 MG PO TABS: 1.0000 | ORAL_TABLET | Freq: Four times a day (QID) | ORAL | Status: DC | PRN

## 2014-10-17 MED ORDER — LORAZEPAM 2 MG PO TABS: 4.0000 mg | ORAL_TABLET | Freq: Two times a day (BID) | ORAL | Status: DC | PRN

## 2014-10-28 ENCOUNTER — Ambulatory Visit: Payer: Medicaid Other | Admitting: Physical Medicine & Rehabilitation

## 2014-11-01 ENCOUNTER — Encounter: Payer: Medicaid Other | Attending: Physical Medicine & Rehabilitation

## 2014-11-01 ENCOUNTER — Ambulatory Visit (HOSPITAL_BASED_OUTPATIENT_CLINIC_OR_DEPARTMENT_OTHER): Payer: Medicaid Other | Admitting: Physical Medicine & Rehabilitation

## 2014-11-01 ENCOUNTER — Encounter: Payer: Self-pay | Admitting: Physical Medicine & Rehabilitation

## 2014-11-01 VITALS — BP 139/78 | HR 74

## 2014-11-01 DIAGNOSIS — M479 Spondylosis, unspecified: Secondary | ICD-10-CM | POA: Diagnosis not present

## 2014-11-01 DIAGNOSIS — K219 Gastro-esophageal reflux disease without esophagitis: Secondary | ICD-10-CM | POA: Diagnosis not present

## 2014-11-01 DIAGNOSIS — M1990 Unspecified osteoarthritis, unspecified site: Secondary | ICD-10-CM | POA: Diagnosis not present

## 2014-11-01 DIAGNOSIS — M545 Low back pain: Secondary | ICD-10-CM | POA: Diagnosis present

## 2014-11-01 DIAGNOSIS — E785 Hyperlipidemia, unspecified: Secondary | ICD-10-CM | POA: Diagnosis not present

## 2014-11-01 DIAGNOSIS — Z8249 Family history of ischemic heart disease and other diseases of the circulatory system: Secondary | ICD-10-CM | POA: Diagnosis not present

## 2014-11-01 DIAGNOSIS — Z833 Family history of diabetes mellitus: Secondary | ICD-10-CM | POA: Insufficient documentation

## 2014-11-01 DIAGNOSIS — J449 Chronic obstructive pulmonary disease, unspecified: Secondary | ICD-10-CM | POA: Diagnosis not present

## 2014-11-01 DIAGNOSIS — Z86010 Personal history of colonic polyps: Secondary | ICD-10-CM | POA: Diagnosis not present

## 2014-11-01 DIAGNOSIS — Z87891 Personal history of nicotine dependence: Secondary | ICD-10-CM | POA: Insufficient documentation

## 2014-11-01 DIAGNOSIS — M47816 Spondylosis without myelopathy or radiculopathy, lumbar region: Secondary | ICD-10-CM | POA: Diagnosis not present

## 2014-11-01 NOTE — Patient Instructions (Addendum)

## 2014-11-01 NOTE — Progress Notes (Signed)
Right lumbar L3, L4 medial branch blocks and L5 dorsal ramus injection under fluoroscopic guidance  Indication: Right Lumbar pain which is not relieved by medication management or other conservative care and interfering with self-care and mobility.  Informed consent was obtained after describing risks and benefits of the procedure with the patient, this includes bleeding, bruising, infection, paralysis and medication side effects. The patient wishes to proceed and has given written consent. The patient was placed in a prone position. The lumbar area was marked and prepped with Betadine. One ML of 1% lidocaine was injected into each of 3 areas into the skin and subcutaneous tissue. Then a 22-gauge 3.5 spinal needle was inserted targeting the junction of the Right S1 superior articular process and sacral ala junction. Needle was advanced under fluoroscopic guidance. Bone contact was made. Omnipaque 180 was injected x0.5 mL demonstrating no intravascular uptake. Then a solution containing one ML of 4 mg per mL dexamethasone and 3 mL of 2% MPF lidocaine was injected x0.5 mL. Then the Right L5 superior articular process in transverse process junction was targeted. Bone contact was made. Omnipaque 180 was injected x0.5 mL demonstrating no intravascular uptake. Then a solution containing one ML of 4 mg per mL dexamethasone and 3 mL of 2% MPF lidocaine was injected x0.5 mL. Then the Right L4 superior articular process in transverse process junction was targeted. Bone contact was made. Omnipaque 180 was injected x0.5 mL demonstrating no intravascular uptake. Then a solution containing one ML of 4 mg per mL dexamethasone and 3 mL of 2% MPF lidocaine was injected x0.5 mL Patient tolerated procedure well. Post procedure instructions were given. Please refer to post procedure form.

## 2014-11-01 NOTE — Progress Notes (Signed)
  PROCEDURE RECORD [facility]   Name: [name] DOB:06-05-1965 MRN: [id number]  Date:11/01/2014  Physician: Alysia Penna, MD    Nurse/CMA:  Shumaker RN  Allergies:  Allergies  Allergen Reactions  . Vicodin [Hydrocodone-Acetaminophen]     Itch    Consent Signed: Yes.    Is patient diabetic? No.  CBG today?   Pregnant: No. LMP: No LMP recorded. (age 49-55)  Anticoagulants: no Anti-inflammatory: no Antibiotics: no  Procedure:R L3-4-5 Medial branch Position: Prone Start Time: 1052 End Time: 1102 Fluoro Time: 26 sec  RN/CMA Health and safety inspector RN    Time 1045 1107    BP 139/78 134/85    Pulse 74 69    Respirations 16 16    O2 Sat 99 100    S/S 6 6    Pain Level 9/10 5/10     D/C home with CNA, patient A & O X 3, D/C instructions reviewed, and sits independently.

## 2014-11-05 ENCOUNTER — Ambulatory Visit (INDEPENDENT_AMBULATORY_CARE_PROVIDER_SITE_OTHER): Payer: Medicaid Other | Admitting: Family Medicine

## 2014-11-05 ENCOUNTER — Encounter: Payer: Self-pay | Admitting: Family Medicine

## 2014-11-05 VITALS — BP 126/84 | HR 67 | Temp 98.4°F | Ht 68.0 in | Wt 238.5 lb

## 2014-11-05 DIAGNOSIS — M5126 Other intervertebral disc displacement, lumbar region: Secondary | ICD-10-CM

## 2014-11-05 DIAGNOSIS — J449 Chronic obstructive pulmonary disease, unspecified: Secondary | ICD-10-CM | POA: Diagnosis not present

## 2014-11-05 DIAGNOSIS — L301 Dyshidrosis [pompholyx]: Secondary | ICD-10-CM | POA: Diagnosis not present

## 2014-11-05 DIAGNOSIS — E039 Hypothyroidism, unspecified: Secondary | ICD-10-CM | POA: Diagnosis not present

## 2014-11-05 DIAGNOSIS — F411 Generalized anxiety disorder: Secondary | ICD-10-CM | POA: Diagnosis not present

## 2014-11-05 DIAGNOSIS — G8929 Other chronic pain: Secondary | ICD-10-CM | POA: Diagnosis not present

## 2014-11-05 DIAGNOSIS — R21 Rash and other nonspecific skin eruption: Secondary | ICD-10-CM | POA: Diagnosis present

## 2014-11-05 DIAGNOSIS — R109 Unspecified abdominal pain: Secondary | ICD-10-CM | POA: Diagnosis not present

## 2014-11-05 DIAGNOSIS — I890 Lymphedema, not elsewhere classified: Secondary | ICD-10-CM

## 2014-11-05 DIAGNOSIS — K219 Gastro-esophageal reflux disease without esophagitis: Secondary | ICD-10-CM | POA: Diagnosis not present

## 2014-11-05 DIAGNOSIS — F418 Other specified anxiety disorders: Secondary | ICD-10-CM | POA: Diagnosis not present

## 2014-11-05 DIAGNOSIS — L02214 Cutaneous abscess of groin: Secondary | ICD-10-CM

## 2014-11-05 MED ORDER — OXYCODONE-ACETAMINOPHEN 10-325 MG PO TABS: 1.0000 | ORAL_TABLET | Freq: Four times a day (QID) | ORAL | Status: DC | PRN

## 2014-11-05 MED ORDER — ACETAMINOPHEN 500 MG PO TABS: 500.0000 mg | ORAL_TABLET | Freq: Four times a day (QID) | ORAL | Status: DC | PRN

## 2014-11-05 MED ORDER — ETODOLAC 200 MG PO CAPS: 200.0000 mg | ORAL_CAPSULE | Freq: Three times a day (TID) | ORAL | Status: DC | PRN

## 2014-11-05 NOTE — Patient Instructions (Signed)
Arthritis Arthritis is pain, redness, warmth, or puffiness (inflammation) of a joint. The joint may be stiff or hurt when you move it. One or more joints may be affected. There are many types of arthritis. Your doctor may not know what type you have right away. The most common cause of arthritis is wear and tear on the joint (osteoarthritis). HOME CARE   Only take medicine as told by your doctor.  Rest the joint as much as possible.  Raise (elevate) your joint if it is puffy.  Use crutches if the painful joint is in your leg.  Drink enough fluids to keep your pee (urine) clear or pale yellow.  Follow your doctor's diet instructions.  Use cold packs for very bad joint pain for 10 to 15 minutes every hour. Ask your doctor if it is okay for you to use hot packs.  Exercise as told by your doctor.  Take a warm shower if you have stiffness in the morning.  Move your sore joints throughout the day. GET HELP RIGHT AWAY IF:   You have a fever.  You have very bad joint pain, puffiness, or redness.  You have many joints that are painful and puffy.  You are not getting better with treatment.  You have very bad back pain or leg weakness.  You cannot control when you poop (bowel movement) or pee (urinate).  You do not feel better in 24 hours or are getting worse.  You are having side effects from your medicine. MAKE SURE YOU:   Understand these instructions.  Will watch your condition.  Will get help right away if you are not doing well or get worse. Document Released: 05/19/2009 Document Revised: 08/24/2011 Document Reviewed: 05/19/2009 [facility] Patient Information 2015 [place], [place]. This information is not intended to replace advice given to you by your health care provider. Make sure you discuss any questions you have with your health care provider.

## 2014-11-06 ENCOUNTER — Telehealth: Payer: Self-pay | Admitting: *Deleted

## 2014-11-06 DIAGNOSIS — M5126 Other intervertebral disc displacement, lumbar region: Secondary | ICD-10-CM

## 2014-11-06 NOTE — Telephone Encounter (Signed)
Prior Authorization received from [employer] for Etodolac. Formulary and PA form placed in provider box for completion. Derl Barrow, RN

## 2014-11-13 MED ORDER — SULINDAC 150 MG PO TABS: 150.0000 mg | ORAL_TABLET | Freq: Two times a day (BID) | ORAL | Status: DC

## 2014-11-13 NOTE — Assessment & Plan Note (Signed)
Chronic RLQ and R LBP improved with recent epidural injections - continue percocet and lyrica - add nsaid (will try etodolac as patient has been on many before which tend to lose efficacy over time)

## 2014-11-13 NOTE — Assessment & Plan Note (Signed)
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

## 2014-11-13 NOTE — Progress Notes (Signed)
   Subjective:   [name] is a 49 y.o. female with a history of lymphedema, chronic pain here for inguinal abscess and f/u of chronic pain.  Pt reports that abscess in right groin recurs in the same location every month or so for about a year. Drains and resolved but comes right back. Gets boils other places but this one is exact same location each time and most frequent. No fever, no real pain or tenderness.  Chronic pain is mildly improved since recent epidural injection, they are consider more permanent ablation procedure. Would like to try something for inflammation again.  Review of Systems:  Per HPI. All other systems reviewed and are negative.   PMH, PSH, Medications, Allergies, and FmHx reviewed and updated in EMR.  Social History: former smoker  Objective:  BP 126/84 mmHg  Pulse 67  Temp(Src) 98.4 F (36.9 C) (Oral)  Ht 5\' 8"  (1.727 m)  Wt 238 lb 8 oz (108.183 kg)  BMI 36.27 kg/m2  LMP 10/30/2014  Gen:  49 y.o. female in NAD HEENT: NCAT, MMM, EOMI, PERRL, anicteric sclerae CV: RRR, no MRG, no JVD Resp: Non-labored, CTAB, no wheezes noted Abd: Soft, NTND, BS present, no guarding or organomegaly Ext: WWP, severe bilateral lymphedema, right inguinal abscess with minimal drainage, no surrounding cellulitis changes, ~1cm  MSK: Full ROM, strength intact Neuro: Alert and oriented, speech normal      Chemistry      Component Value Date/Time   NA 139 07/19/2014 1414   K 4.4 07/19/2014 1414   CL 103 07/19/2014 1414   CO2 25 07/19/2014 1414   BUN 12 07/19/2014 1414   CREATININE 0.84 07/19/2014 1414   CREATININE 0.83 12/31/2013 1157      Component Value Date/Time   CALCIUM 8.8 07/19/2014 1414   ALKPHOS 65 07/19/2014 1414   AST 11 07/19/2014 1414   ALT 9 07/19/2014 1414   BILITOT 0.4 07/19/2014 1414      Lab Results  Component Value Date   WBC 4.3 07/19/2014   HGB 13.9 07/19/2014   HCT 40.7 07/19/2014   MCV 100.0 07/19/2014   PLT 290 07/19/2014   Lab  Results  Component Value Date   TSH 0.507 03/25/2014   Lab Results  Component Value Date   HGBA1C 5.1 01/01/2014   Assessment & Plan:     [name] is a 49 y.o. female here for chronic pain and inguinal boil.  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

## 2014-11-28 ENCOUNTER — Telehealth: Payer: Self-pay | Admitting: Family Medicine

## 2014-11-28 NOTE — Telephone Encounter (Signed)
Pt is asking to speak with pcp, says it is too long for a message so she would rather just speak with Dr. Sherril Cong

## 2014-11-29 ENCOUNTER — Telehealth: Payer: Self-pay | Admitting: Family Medicine

## 2014-11-29 NOTE — Telephone Encounter (Signed)
Pt called because she is trying to fill out papers for [employer]SI. She needs the last 3 office visits she had here. The date, the doctor and the reason. She also needs any and all hospitalizations and surgeries that date back to 2014. They also need to include when, where, and the reason for the visit. Please call patient when ready jw

## 2014-12-02 NOTE — Telephone Encounter (Signed)
Left message on voicemail that patient will have to come sign a ROI before we can print information.

## 2014-12-10 ENCOUNTER — Ambulatory Visit: Payer: Medicaid Other | Admitting: Physical Medicine & Rehabilitation

## 2014-12-11 ENCOUNTER — Telehealth: Payer: Self-pay | Admitting: Family Medicine

## 2014-12-11 ENCOUNTER — Ambulatory Visit: Payer: Medicaid Other

## 2014-12-11 NOTE — Telephone Encounter (Signed)
Pt called because she is scheduled in the Dermatology Clinic today to have a cyst removed. She said it is inflamed but has not came to a head and wanted to know does she have to wait or should she still come in. Please call as soon as you can since her appointment is at 1:30 pm. Blima Rich

## 2014-12-11 NOTE — Telephone Encounter (Signed)
Spoke with patient and informed her that per Dr. Gwendlyn Deutscher she would still like her to be seen. Patient states that she is taking an old rx she had for amoxicillin and it is slowly helping with the inflammation and she would pefer not to come in until area is no longer inflammed. Again informed patient that Dr. Gwendlyn Deutscher would still like for her to come in and that the schedule for dermatology clinic was booked through November if she cancels this appointment. Patient states she will just call back once the December schedule was out and reschedule this appointment.

## 2014-12-13 ENCOUNTER — Encounter: Payer: Medicaid Other | Attending: Physical Medicine & Rehabilitation

## 2014-12-13 ENCOUNTER — Ambulatory Visit (HOSPITAL_BASED_OUTPATIENT_CLINIC_OR_DEPARTMENT_OTHER): Payer: Medicaid Other | Admitting: Physical Medicine & Rehabilitation

## 2014-12-13 ENCOUNTER — Encounter: Payer: Self-pay | Admitting: Physical Medicine & Rehabilitation

## 2014-12-13 VITALS — BP 121/78 | HR 55 | Resp 14

## 2014-12-13 DIAGNOSIS — E785 Hyperlipidemia, unspecified: Secondary | ICD-10-CM | POA: Diagnosis not present

## 2014-12-13 DIAGNOSIS — M479 Spondylosis, unspecified: Secondary | ICD-10-CM | POA: Diagnosis not present

## 2014-12-13 DIAGNOSIS — M1990 Unspecified osteoarthritis, unspecified site: Secondary | ICD-10-CM | POA: Diagnosis not present

## 2014-12-13 DIAGNOSIS — Z86010 Personal history of colonic polyps: Secondary | ICD-10-CM | POA: Diagnosis not present

## 2014-12-13 DIAGNOSIS — K219 Gastro-esophageal reflux disease without esophagitis: Secondary | ICD-10-CM | POA: Insufficient documentation

## 2014-12-13 DIAGNOSIS — Z833 Family history of diabetes mellitus: Secondary | ICD-10-CM | POA: Insufficient documentation

## 2014-12-13 DIAGNOSIS — M545 Low back pain: Secondary | ICD-10-CM | POA: Diagnosis present

## 2014-12-13 DIAGNOSIS — Z8249 Family history of ischemic heart disease and other diseases of the circulatory system: Secondary | ICD-10-CM | POA: Insufficient documentation

## 2014-12-13 DIAGNOSIS — Z87891 Personal history of nicotine dependence: Secondary | ICD-10-CM | POA: Insufficient documentation

## 2014-12-13 DIAGNOSIS — M47816 Spondylosis without myelopathy or radiculopathy, lumbar region: Secondary | ICD-10-CM | POA: Diagnosis not present

## 2014-12-13 DIAGNOSIS — J449 Chronic obstructive pulmonary disease, unspecified: Secondary | ICD-10-CM | POA: Diagnosis not present

## 2014-12-13 NOTE — Progress Notes (Signed)
Right T 12, L1, L2 medial branch blocks under fluoroscopic guidance  Indication: Right Lumbar pain which is not relieved by medication management or other conservative care and interfering with self-care and mobility.Patient had relief of her right lateral chest wall pain with the prior L3 L4 L5 medial branch block but not of her back pain. She points to an area above L4. Imaging studies show primarily L4-5 L5-S1 facet arthropathy however there are some arthritic changes at L3-4 therefore the current levels were targeted  Informed consent was obtained after describing risks and benefits of the procedure with the patient, this includes bleeding, bruising, infection, paralysis and medication side effects. The patient wishes to proceed and has given written consent. The patient was placed in a prone position. The lumbar area was marked and prepped with Betadine. One ML of 1% lidocaine was injected into each of 3 areas into the skin and subcutaneous tissue. Then a 22-gauge 3.5 inch spinal needle was inserted targeting the junction of the Right L3 superior articular process /transverse process junction. Needle was advanced under fluoroscopic guidance. Bone contact was made. Omnipaque 180 was injected x0.5 mL demonstrating no intravascular uptake. Then a solution containing one ML of 4 mg per mL dexamethasone and 3 mL of 2% MPF lidocaine was injected x0.5 mL. Then the Right L2 superior articular process in transverse process junction was targeted. Bone contact was made. Omnipaque 180 was injected x0.5 mL demonstrating no intravascular uptake. Then a solution containing one ML of 4 mg per mL dexamethasone and 3 mL of 2% MPF lidocaine was injected x0.5 mL. Then the Right L1 superior articular process in transverse process junction was targeted. Bone contact was made. Omnipaque 180 was injected x0.5 mL demonstrating no intravascular uptake. Then a solution containing one ML of 4 mg per mL dexamethasone and 3 mL of 2% MPF  lidocaine was injected x0.5 mL. Patient tolerated procedure well. Post procedure instructions were given. Please refer to post procedure form.

## 2014-12-13 NOTE — Patient Instructions (Signed)
Last time we did L3 L4 L5 medial branch blocks based on pain location as well as x-ray findings. You had some relief of the abdominal pain but not back pain.  This time we did T12-L1 and L2 medial branch blocks based on more upper lumbar pain.  We will check you next visit no injections scheduled. We'll discuss next steps at that time

## 2014-12-13 NOTE — Progress Notes (Signed)
  [facility]   Name: [name] DOB:03/05/1966 MRN: [id number]  Date:12/13/2014  Physician: Alysia Penna, MD    Nurse/CMA: Mancel Parsons  Allergies:  Allergies  Allergen Reactions  . Vicodin [Hydrocodone-Acetaminophen]     Itch    Consent Signed: Yes.    Is patient diabetic? No.  CBG today?   Pregnant: No. LMP: No LMP recorded. (age 49-55)  Anticoagulants: no Anti-inflammatory: no Antibiotics: yes (amoxicillin 12/12/2014  PM)  Procedure: right medial branch block injection  Position: Prone Start Time:9:53am   End Time: 10:00am  Fluoro Time: 49  RN/CMA Rolan Bucco Areej Tayler    Time 9:08 am 10:05 am    BP 121/78 149/75    Pulse 55 60    Respirations 14 14    O2 Sat 99 98    S/S 6 6    Pain Level 10/10 7/10      D/C home with cyreeta tate, patient A & O X 3, D/C instructions reviewed, and sits independently.

## 2015-01-09 ENCOUNTER — Ambulatory Visit: Payer: Medicaid Other | Admitting: Physical Medicine & Rehabilitation

## 2015-01-09 ENCOUNTER — Other Ambulatory Visit: Payer: Self-pay | Admitting: *Deleted

## 2015-01-09 DIAGNOSIS — M5126 Other intervertebral disc displacement, lumbar region: Secondary | ICD-10-CM

## 2015-01-09 NOTE — Telephone Encounter (Signed)
Opened in error. [name]  

## 2015-01-13 ENCOUNTER — Telehealth: Payer: Self-pay | Admitting: *Deleted

## 2015-01-13 NOTE — Telephone Encounter (Signed)
Prior Authorization received from [employer] for Lyrica 150 mg cap. Formulary and PA form placed in provider box for completion. Derl Barrow, RN

## 2015-01-13 NOTE — Telephone Encounter (Signed)
Received PA approval for Lyrica 150 mg via [employer].  Med approved for 01/13/15 - 01/13/16.  [place] pharmacy informed.  PA approval number [id number]. Derl Barrow, RN

## 2015-01-20 ENCOUNTER — Ambulatory Visit: Payer: Medicaid Other | Admitting: Family Medicine

## 2015-01-29 ENCOUNTER — Ambulatory Visit: Payer: Medicaid Other | Admitting: Family Medicine

## 2015-02-10 ENCOUNTER — Encounter: Payer: Self-pay | Admitting: Family Medicine

## 2015-02-10 ENCOUNTER — Ambulatory Visit (INDEPENDENT_AMBULATORY_CARE_PROVIDER_SITE_OTHER): Payer: Medicaid Other | Admitting: Family Medicine

## 2015-02-10 VITALS — BP 150/90 | HR 58 | Temp 98.0°F | Ht 68.0 in | Wt 224.0 lb

## 2015-02-10 DIAGNOSIS — I890 Lymphedema, not elsewhere classified: Secondary | ICD-10-CM

## 2015-02-10 DIAGNOSIS — F411 Generalized anxiety disorder: Secondary | ICD-10-CM | POA: Diagnosis not present

## 2015-02-10 DIAGNOSIS — R3589 Other polyuria: Secondary | ICD-10-CM | POA: Insufficient documentation

## 2015-02-10 DIAGNOSIS — G8929 Other chronic pain: Secondary | ICD-10-CM | POA: Diagnosis not present

## 2015-02-10 DIAGNOSIS — F419 Anxiety disorder, unspecified: Secondary | ICD-10-CM

## 2015-02-10 DIAGNOSIS — E039 Hypothyroidism, unspecified: Secondary | ICD-10-CM | POA: Diagnosis not present

## 2015-02-10 DIAGNOSIS — J449 Chronic obstructive pulmonary disease, unspecified: Secondary | ICD-10-CM | POA: Diagnosis not present

## 2015-02-10 DIAGNOSIS — F32A Depression, unspecified: Secondary | ICD-10-CM

## 2015-02-10 DIAGNOSIS — M5126 Other intervertebral disc displacement, lumbar region: Secondary | ICD-10-CM

## 2015-02-10 DIAGNOSIS — L732 Hidradenitis suppurativa: Secondary | ICD-10-CM

## 2015-02-10 DIAGNOSIS — R109 Unspecified abdominal pain: Secondary | ICD-10-CM | POA: Diagnosis not present

## 2015-02-10 DIAGNOSIS — L301 Dyshidrosis [pompholyx]: Secondary | ICD-10-CM | POA: Diagnosis not present

## 2015-02-10 DIAGNOSIS — F329 Major depressive disorder, single episode, unspecified: Secondary | ICD-10-CM

## 2015-02-10 DIAGNOSIS — R21 Rash and other nonspecific skin eruption: Secondary | ICD-10-CM | POA: Diagnosis not present

## 2015-02-10 DIAGNOSIS — E038 Other specified hypothyroidism: Secondary | ICD-10-CM

## 2015-02-10 DIAGNOSIS — R358 Other polyuria: Secondary | ICD-10-CM

## 2015-02-10 DIAGNOSIS — R5382 Chronic fatigue, unspecified: Secondary | ICD-10-CM

## 2015-02-10 DIAGNOSIS — R5383 Other fatigue: Secondary | ICD-10-CM | POA: Insufficient documentation

## 2015-02-10 DIAGNOSIS — E890 Postprocedural hypothyroidism: Secondary | ICD-10-CM

## 2015-02-10 DIAGNOSIS — K219 Gastro-esophageal reflux disease without esophagitis: Secondary | ICD-10-CM | POA: Diagnosis not present

## 2015-02-10 DIAGNOSIS — F418 Other specified anxiety disorders: Secondary | ICD-10-CM | POA: Diagnosis not present

## 2015-02-10 LAB — POCT URINALYSIS DIPSTICK
BILIRUBIN UA: NEGATIVE
GLUCOSE UA: NEGATIVE
LEUKOCYTES UA: NEGATIVE
NITRITE UA: NEGATIVE
PH UA: 5.5
Protein, UA: NEGATIVE
Spec Grav, UA: 1.02
Urobilinogen, UA: 0.2

## 2015-02-10 LAB — COMPLETE METABOLIC PANEL WITH GFR
ALT: 10 U/L (ref 6–29)
AST: 13 U/L (ref 10–35)
Albumin: 3.8 g/dL (ref 3.6–5.1)
Alkaline Phosphatase: 56 U/L (ref 33–115)
BILIRUBIN TOTAL: 0.5 mg/dL (ref 0.2–1.2)
BUN: 12 mg/dL (ref 7–25)
CHLORIDE: 104 mmol/L (ref 98–110)
CO2: 23 mmol/L (ref 20–31)
CREATININE: 0.92 mg/dL (ref 0.50–1.10)
Calcium: 8.3 mg/dL — ABNORMAL LOW (ref 8.6–10.2)
GFR, EST AFRICAN AMERICAN: 85 mL/min (ref >=60)
GFR, Est Non African American: 73 mL/min (ref >=60)
GLUCOSE: 77 mg/dL (ref 65–99)
Potassium: 3.8 mmol/L (ref 3.5–5.3)
SODIUM: 137 mmol/L (ref 135–146)
TOTAL PROTEIN: 6.2 g/dL (ref 6.1–8.1)

## 2015-02-10 LAB — CBC
HEMATOCRIT: 40.6 % (ref 36.0–46.0)
Hemoglobin: 13.8 g/dL (ref 12.0–15.0)
MCH: 34.3 pg — AB (ref 26.0–34.0)
MCHC: 34 g/dL (ref 30.0–36.0)
MCV: 101 fL — AB (ref 78.0–100.0)
MPV: 9.2 fL (ref 8.6–12.4)
PLATELETS: 308 10*3/uL (ref 150–400)
RBC: 4.02 MIL/uL (ref 3.87–5.11)
RDW: 12.6 % (ref 11.5–15.5)
WBC: 4.9 10*3/uL (ref 4.0–10.5)

## 2015-02-10 LAB — TSH: TSH: 24.548 u[IU]/mL — AB (ref 0.350–4.500)

## 2015-02-10 LAB — POCT UA - MICROSCOPIC ONLY

## 2015-02-10 MED ORDER — OXYCODONE-ACETAMINOPHEN 10-325 MG PO TABS: 1.0000 | ORAL_TABLET | Freq: Four times a day (QID) | ORAL | Status: DC | PRN

## 2015-02-10 MED ORDER — DULOXETINE HCL 60 MG PO CPEP: 60.0000 mg | ORAL_CAPSULE | Freq: Every day | ORAL | Status: DC

## 2015-02-10 MED ORDER — AMOXICILLIN-POT CLAVULANATE 875-125 MG PO TABS: 1.0000 | ORAL_TABLET | Freq: Two times a day (BID) | ORAL | Status: DC

## 2015-02-10 MED ORDER — PREGABALIN 150 MG PO CAPS: 150.0000 mg | ORAL_CAPSULE | Freq: Two times a day (BID) | ORAL | Status: DC

## 2015-02-10 MED ORDER — CLONAZEPAM 2 MG PO TABS: 2.0000 mg | ORAL_TABLET | Freq: Two times a day (BID) | ORAL | Status: DC | PRN

## 2015-02-10 NOTE — Patient Instructions (Signed)
We will call in a few days to discuss your lab results.

## 2015-02-14 MED ORDER — LEVOTHYROXINE SODIUM 200 MCG PO TABS: 200.0000 ug | ORAL_TABLET | Freq: Every day | ORAL | Status: DC

## 2015-02-14 NOTE — Assessment & Plan Note (Addendum)
Recurrent inguinal boil, currently inflamed and draining - augmentin (reports this worked well for these in the past) - f/u with surgery next week

## 2015-02-14 NOTE — Assessment & Plan Note (Signed)
Appears less energetic than usual, reports feeling tired all the time despite adequate sleep, suspect this is related to depression and/or thyroid but will check CBC and CMP

## 2015-02-14 NOTE — Assessment & Plan Note (Signed)
Worsening anxiety and depression since the death of her mother. - listened and provided emotional support, encouraged her to reach out to friends and family - continue cymbalta - switch ativan to klonopin for longer duration of action and hopefully more stability

## 2015-02-14 NOTE — Assessment & Plan Note (Signed)
Has been having choking sensation that usually means her thyroid is acting up, reports good compliance with 123mcg synthroid - recheck TSH and adjust dose as needed - f/u in 1 month to recheck

## 2015-02-14 NOTE — Progress Notes (Signed)
Subjective:   [name] is a 49 y.o. female with a history of lymphedema, depression, hypothyroidism and hidradenitis here for f/u and new boil.  Pt reports Feeling depressed and tired for the past few months since the death of her mother. She sleeps all the time but still feels sleepy. She is also very anxious and was recently unable to get dental work done due to a panic attack. She is having a choking sensation that has had negative work-up in the past but seems to go along with thyroid abnormalities. She has been seeing surgery for her hidradenitis but the boil in her right groin keeps coming back and needs to see them again.  Patient also reports peeing all the time and wanting to be checked for diabetes, this is a very common worry and request for her.  Review of Systems:  Per HPI. All other systems reviewed and are negative.   PMH, PSH, Medications, Allergies, and FmHx reviewed and updated in EMR.  Social History: former smoker  Objective:  BP 150/90 mmHg  Pulse 58  Temp(Src) 98 F (36.7 C) (Oral)  Ht 5\' 8"  (1.727 m)  Wt 224 lb (101.606 kg)  BMI 34.07 kg/m2  Gen:  49 y.o. female in NAD HEENT: NCAT, MMM, EOMI, PERRL, anicteric sclerae CV: RRR, no MRG, no JVD Resp: Non-labored, CTAB, no wheezes noted Abd: Soft, NTND, BS present, no guarding or organomegaly. Inflamed, draining boil in right inguinal fold Ext: WWP, no edema MSK: Full ROM, strength intact Neuro: Alert and oriented, speech normal      Chemistry      Component Value Date/Time   NA 137 02/10/2015 1458   K 3.8 02/10/2015 1458   CL 104 02/10/2015 1458   CO2 23 02/10/2015 1458   BUN 12 02/10/2015 1458   CREATININE 0.92 02/10/2015 1458   CREATININE 0.83 12/31/2013 1157      Component Value Date/Time   CALCIUM 8.3* 02/10/2015 1458   ALKPHOS 56 02/10/2015 1458   AST 13 02/10/2015 1458   ALT 10 02/10/2015 1458   BILITOT 0.5 02/10/2015 1458      Lab Results  Component Value Date   WBC 4.9  02/10/2015   HGB 13.8 02/10/2015   HCT 40.6 02/10/2015   MCV 101.0* 02/10/2015   PLT 308 02/10/2015   Lab Results  Component Value Date   TSH 24.548* 02/10/2015   Lab Results  Component Value Date   HGBA1C 5.1 01/01/2014   Assessment & Plan:     [name] is a 49 y.o. female here for f/u  Hypothyroidism Has been having choking sensation that usually means her thyroid is acting up, reports good compliance with 151mcg synthroid - recheck TSH and adjust dose as needed - f/u in 1 month to recheck  HIDRADENITIS SUPPURATIVA Recurrent inguinal boil, currently inflamed and draining - augmentin (reports this worked well for these in the past) - f/u with surgery next week  Polyuria Worried about diabetes, UA showed no glucose or signs of infection - reassurance given, recent normal A1c - monitor  Anxiety and depression Worsening anxiety and depression since the death of her mother. - listened and provided emotional support, encouraged her to reach out to friends and family - continue cymbalta - switch ativan to klonopin for longer duration of action and hopefully more stability  Fatigue Appears less energetic than usual, reports feeling tired all the time despite adequate sleep, suspect this is related to depression and/or thyroid but will check  CBC and CMP  Lymphedema Refilled percocet for chronic leg pain    Beverlyn Roux, MD, MPH [facility] Family [facility] PGY-3 02/14/2015 10:13 AM

## 2015-02-14 NOTE — Assessment & Plan Note (Signed)
Refilled percocet for chronic leg pain

## 2015-02-14 NOTE — Assessment & Plan Note (Signed)
Worried about diabetes, UA showed no glucose or signs of infection - reassurance given, recent normal A1c - monitor

## 2015-03-25 ENCOUNTER — Telehealth: Payer: Self-pay | Admitting: *Deleted

## 2015-03-25 NOTE — Telephone Encounter (Signed)
Called patient to offer flu vaccine. Patient states that she has something going on right now but will call back to schedule appt to receive flu vaccine. Velora Heckler, RN

## 2015-05-07 ENCOUNTER — Other Ambulatory Visit: Payer: Self-pay | Admitting: Family Medicine

## 2015-05-07 DIAGNOSIS — M5126 Other intervertebral disc displacement, lumbar region: Secondary | ICD-10-CM

## 2015-05-07 DIAGNOSIS — I890 Lymphedema, not elsewhere classified: Secondary | ICD-10-CM

## 2015-05-08 ENCOUNTER — Ambulatory Visit: Payer: Medicaid Other | Admitting: Family Medicine

## 2015-05-14 ENCOUNTER — Ambulatory Visit: Payer: Medicaid Other | Admitting: Family Medicine

## 2015-05-14 NOTE — Telephone Encounter (Signed)
Need refill for her oxycodone and pregabalin.  Call when ready for pickup.

## 2015-05-17 ENCOUNTER — Telehealth: Payer: Self-pay | Admitting: Family Medicine

## 2015-05-17 NOTE — Telephone Encounter (Signed)
AFTER HOURS LINE   Patient has a h/o boils in the groin, from chart review a h/o hidradenitis suppurativa. She's calling in to see if she can get a Rx on her amoxicillin as she sees Dr. Sherril Cong every 3 months.  The area is not draining. It is actually going down with Motrin- it was the size of half a lemon and now is decreased to half the size, however she was hoping to get antibiotics.  She is not applying heat to the area. Per EMR, she was last prescribed amoxicillin 12/16, at that time she was referred to surgery however she has not seen surgery.   She is not have fevers or chills. Given there has been improvement with NSAIDs, counseled on continuing this with heat as needed. Discussed ED precautions. Patient would like to be seen in the clinic but is adamant that it must be with a female provider. Unfortunately the PCP and the red team are not available. The same day providers on Monday are both female. Patient scheduled with Dr. Brett Albino on 3/14 at 10:30 for evaluation.  Archie Patten, MD [facility] Resident  05/17/2015, 11:35 AM

## 2015-05-19 ENCOUNTER — Telehealth: Payer: Self-pay | Admitting: Family Medicine

## 2015-05-19 MED ORDER — OXYCODONE-ACETAMINOPHEN 10-325 MG PO TABS: 1.0000 | ORAL_TABLET | Freq: Four times a day (QID) | ORAL | Status: DC | PRN

## 2015-05-19 MED ORDER — PREGABALIN 150 MG PO CAPS: 150.0000 mg | ORAL_CAPSULE | Freq: Two times a day (BID) | ORAL | Status: DC

## 2015-05-19 NOTE — Telephone Encounter (Signed)
Pt has appointment tomorrow @ 10:30am.  She is requesting to be able to pick these Rx's up at her appointment tomorrow. Malaquias Lenker, Salome Spotted

## 2015-05-19 NOTE — Telephone Encounter (Signed)
Medications refilled, will be placed up front for pick up at her appointment tomorrow with Dr. Brett Albino.

## 2015-05-19 NOTE — Telephone Encounter (Signed)
Dr. Sherril Cong,  Where did you print these to? Master Touchet, Salome Spotted, CMA

## 2015-05-20 ENCOUNTER — Ambulatory Visit: Payer: Medicaid Other | Admitting: Internal Medicine

## 2015-05-20 NOTE — Telephone Encounter (Signed)
Contacted pt and gave her the below information and she also asked if she still needed to come in today since the boil she had "lanced" and she was feeling better.  Asked if there was any redness, warmth, or pain in the area and she said no, told her to keep the wound clean.  So pt decided not to come in today.  Katharina Caper, April D, [place]

## 2015-06-03 ENCOUNTER — Ambulatory Visit (INDEPENDENT_AMBULATORY_CARE_PROVIDER_SITE_OTHER): Payer: Medicaid Other | Admitting: Family Medicine

## 2015-06-03 ENCOUNTER — Encounter: Payer: Self-pay | Admitting: Family Medicine

## 2015-06-03 ENCOUNTER — Encounter: Payer: Medicaid Other | Admitting: Family Medicine

## 2015-06-03 VITALS — BP 141/82 | HR 57 | Temp 98.3°F | Ht 67.0 in | Wt 229.9 lb

## 2015-06-03 DIAGNOSIS — E039 Hypothyroidism, unspecified: Secondary | ICD-10-CM

## 2015-06-03 DIAGNOSIS — M47816 Spondylosis without myelopathy or radiculopathy, lumbar region: Secondary | ICD-10-CM | POA: Diagnosis not present

## 2015-06-03 DIAGNOSIS — I890 Lymphedema, not elsewhere classified: Secondary | ICD-10-CM | POA: Diagnosis not present

## 2015-06-03 DIAGNOSIS — K219 Gastro-esophageal reflux disease without esophagitis: Secondary | ICD-10-CM | POA: Diagnosis not present

## 2015-06-03 DIAGNOSIS — F418 Other specified anxiety disorders: Secondary | ICD-10-CM | POA: Diagnosis not present

## 2015-06-03 DIAGNOSIS — M5126 Other intervertebral disc displacement, lumbar region: Secondary | ICD-10-CM

## 2015-06-03 DIAGNOSIS — L301 Dyshidrosis [pompholyx]: Secondary | ICD-10-CM | POA: Diagnosis not present

## 2015-06-03 DIAGNOSIS — F411 Generalized anxiety disorder: Secondary | ICD-10-CM | POA: Diagnosis not present

## 2015-06-03 DIAGNOSIS — L732 Hidradenitis suppurativa: Secondary | ICD-10-CM

## 2015-06-03 DIAGNOSIS — R109 Unspecified abdominal pain: Secondary | ICD-10-CM | POA: Diagnosis not present

## 2015-06-03 DIAGNOSIS — R21 Rash and other nonspecific skin eruption: Secondary | ICD-10-CM | POA: Diagnosis present

## 2015-06-03 DIAGNOSIS — G8929 Other chronic pain: Secondary | ICD-10-CM | POA: Diagnosis not present

## 2015-06-03 DIAGNOSIS — J449 Chronic obstructive pulmonary disease, unspecified: Secondary | ICD-10-CM | POA: Diagnosis not present

## 2015-06-03 MED ORDER — OXYCODONE-ACETAMINOPHEN 10-325 MG PO TABS: 1.0000 | ORAL_TABLET | Freq: Four times a day (QID) | ORAL | Status: DC | PRN

## 2015-06-03 MED ORDER — DOXYCYCLINE HYCLATE 100 MG PO TABS: 100.0000 mg | ORAL_TABLET | Freq: Two times a day (BID) | ORAL | Status: DC

## 2015-06-03 MED ORDER — KETOROLAC TROMETHAMINE 60 MG/2ML IM SOLN
60.0000 mg | Freq: Once | INTRAMUSCULAR | Status: AC
  Administered 2015-06-03: 60 mg via INTRAMUSCULAR

## 2015-06-03 NOTE — Progress Notes (Signed)
Subjective:    [name] is a 50 y.o. female who presents to [facility] today for chronic back pain and recheck of her hypothyroidism.  She was initially here for a Pap smear and "full physical" but states she only sees Dr. Sherril Cong and is not interested in seeing anyone else.  Therefore we discussed the following. :  1.  Chronic back pain:  Long-term issue for patient.  Receives montly narcotics and lyrica for this.  No acute worsening.  No recent injuries.  No falls.  No bladd/erbowel incontinence.    2.  Hypothyroidism:  Worsens lymphedema when this is uncontrolled.  Feels this is worsening.  Last TSH was elevated.  Would like to have this checked again.    3.  Vaginal boils:  Also mentions these today as well.  States these always occur around the time of her menses.  Menses started this week.  Has had uterine cramping.  Bleeding is regular.  However she has felt the start of a boil with pain and some redness/swelling.  No fevers or chills.  Occasionally these self-express; other times she is treated with antibiotics.     ROS as above per HPI, otherwise neg.   The following portions of the patient's history were reviewed and updated as appropriate: allergies, current medications, past medical history, family and social history, and problem list. Patient is a nonsmoker.     Objective:   Physical Exam BP 141/82 mmHg  Pulse 57  Temp(Src) 98.3 F (36.8 C) (Oral)  Ht 5\' 7"  (1.702 m)  Wt 229 lb 14.4 oz (104.282 kg)  BMI 36.00 kg/m2  LMP 06/01/2015 (Exact Date) Gen:  Alert, cooperative patient who appears stated age in no acute distress.  Vital signs reviewed. HEENT: EOMI,  MMM Cardiac:  Regular rate and rhythm without murmur auscultated.  Good S1/S2. Pulm:  Clear to auscultation bilaterally with good air movement.  No wheezes or rales noted.   Abd:  Soft/nondistended/nontender.  Good bowel sounds throughout all four quadrants.  No masses noted.  Back:  Sitting stiffly in chair.  TTP along BL  lumbar region.  SLR testing is negative BL.  Sensation and strength are normal BL LE's.  No antalgic gait.  Exts: +2 LE edema noted bilaterally.  GYN:  Deferred at patient request.    No results found for this or any previous visit (from the past 72 hour(s)).

## 2015-06-03 NOTE — Patient Instructions (Signed)
I have refilled your Percocet until May.  Come back to see Dr. Sherril Cong sometime in May or June before she leaves at the end of June.    We are rechecking your thyroid, cholesterol, other labs today.  Toradol shot provided today for relief of back pain.  It was good to meet you today.

## 2015-06-05 NOTE — Assessment & Plan Note (Signed)
Checking TSH today. No change to synthroid until results are back.

## 2015-06-05 NOTE — Assessment & Plan Note (Signed)
Refill for her chronic narcotics.  I have added a "do not fill until 30 days" order as she just filled a prescription earlier this week. No acute worsening.   She has refill for lyrica already sent in, with refills provided by another clinician.   FU with PCP

## 2015-06-05 NOTE — Assessment & Plan Note (Signed)
Declined GYN exam. Treating with doxy.   If worsens, needs to return for pelvic exam and possible I&D.

## 2015-06-27 ENCOUNTER — Encounter: Payer: Self-pay | Admitting: Family Medicine

## 2015-06-27 ENCOUNTER — Encounter (HOSPITAL_COMMUNITY): Payer: Self-pay | Admitting: *Deleted

## 2015-06-27 ENCOUNTER — Ambulatory Visit (INDEPENDENT_AMBULATORY_CARE_PROVIDER_SITE_OTHER): Payer: Medicaid Other | Admitting: Family Medicine

## 2015-06-27 ENCOUNTER — Emergency Department (HOSPITAL_COMMUNITY)
Admission: EM | Admit: 2015-06-27 | Discharge: 2015-06-27 | Disposition: A | Discharge status: Home / Self Care | Payer: Medicaid Other | Attending: Emergency Medicine | Admitting: Emergency Medicine

## 2015-06-27 VITALS — BP 144/83 | HR 58 | Temp 99.5°F | Ht 68.0 in | Wt 235.1 lb

## 2015-06-27 DIAGNOSIS — M19042 Primary osteoarthritis, left hand: Secondary | ICD-10-CM | POA: Diagnosis not present

## 2015-06-27 DIAGNOSIS — Z7951 Long term (current) use of inhaled steroids: Secondary | ICD-10-CM | POA: Insufficient documentation

## 2015-06-27 DIAGNOSIS — M169 Osteoarthritis of hip, unspecified: Secondary | ICD-10-CM | POA: Insufficient documentation

## 2015-06-27 DIAGNOSIS — F411 Generalized anxiety disorder: Secondary | ICD-10-CM | POA: Diagnosis not present

## 2015-06-27 DIAGNOSIS — E039 Hypothyroidism, unspecified: Secondary | ICD-10-CM | POA: Diagnosis not present

## 2015-06-27 DIAGNOSIS — F419 Anxiety disorder, unspecified: Secondary | ICD-10-CM | POA: Diagnosis not present

## 2015-06-27 DIAGNOSIS — Z791 Long term (current) use of non-steroidal anti-inflammatories (NSAID): Secondary | ICD-10-CM | POA: Diagnosis not present

## 2015-06-27 DIAGNOSIS — R011 Cardiac murmur, unspecified: Secondary | ICD-10-CM | POA: Diagnosis not present

## 2015-06-27 DIAGNOSIS — R300 Dysuria: Secondary | ICD-10-CM

## 2015-06-27 DIAGNOSIS — R21 Rash and other nonspecific skin eruption: Secondary | ICD-10-CM | POA: Diagnosis present

## 2015-06-27 DIAGNOSIS — E785 Hyperlipidemia, unspecified: Secondary | ICD-10-CM | POA: Diagnosis not present

## 2015-06-27 DIAGNOSIS — F418 Other specified anxiety disorders: Secondary | ICD-10-CM | POA: Diagnosis not present

## 2015-06-27 DIAGNOSIS — R350 Frequency of micturition: Secondary | ICD-10-CM | POA: Diagnosis not present

## 2015-06-27 DIAGNOSIS — F329 Major depressive disorder, single episode, unspecified: Secondary | ICD-10-CM | POA: Insufficient documentation

## 2015-06-27 DIAGNOSIS — L301 Dyshidrosis [pompholyx]: Secondary | ICD-10-CM | POA: Diagnosis not present

## 2015-06-27 DIAGNOSIS — M170 Bilateral primary osteoarthritis of knee: Secondary | ICD-10-CM | POA: Diagnosis not present

## 2015-06-27 DIAGNOSIS — Z86010 Personal history of colonic polyps: Secondary | ICD-10-CM | POA: Insufficient documentation

## 2015-06-27 DIAGNOSIS — Z79899 Other long term (current) drug therapy: Secondary | ICD-10-CM | POA: Diagnosis not present

## 2015-06-27 DIAGNOSIS — K219 Gastro-esophageal reflux disease without esophagitis: Secondary | ICD-10-CM | POA: Insufficient documentation

## 2015-06-27 DIAGNOSIS — R109 Unspecified abdominal pain: Secondary | ICD-10-CM | POA: Diagnosis not present

## 2015-06-27 DIAGNOSIS — G8929 Other chronic pain: Secondary | ICD-10-CM | POA: Diagnosis not present

## 2015-06-27 DIAGNOSIS — Z87891 Personal history of nicotine dependence: Secondary | ICD-10-CM | POA: Diagnosis not present

## 2015-06-27 DIAGNOSIS — M19041 Primary osteoarthritis, right hand: Secondary | ICD-10-CM | POA: Diagnosis not present

## 2015-06-27 DIAGNOSIS — M549 Dorsalgia, unspecified: Secondary | ICD-10-CM

## 2015-06-27 DIAGNOSIS — T39395A Adverse effect of other nonsteroidal anti-inflammatory drugs [NSAID], initial encounter: Secondary | ICD-10-CM | POA: Diagnosis not present

## 2015-06-27 DIAGNOSIS — J449 Chronic obstructive pulmonary disease, unspecified: Secondary | ICD-10-CM

## 2015-06-27 DIAGNOSIS — Z7952 Long term (current) use of systemic steroids: Secondary | ICD-10-CM | POA: Insufficient documentation

## 2015-06-27 DIAGNOSIS — M545 Low back pain: Secondary | ICD-10-CM | POA: Insufficient documentation

## 2015-06-27 DIAGNOSIS — Z8619 Personal history of other infectious and parasitic diseases: Secondary | ICD-10-CM | POA: Insufficient documentation

## 2015-06-27 DIAGNOSIS — K2960 Other gastritis without bleeding: Secondary | ICD-10-CM | POA: Diagnosis not present

## 2015-06-27 DIAGNOSIS — R0602 Shortness of breath: Secondary | ICD-10-CM

## 2015-06-27 LAB — POCT URINALYSIS DIPSTICK
Bilirubin, UA: NEGATIVE
GLUCOSE UA: NEGATIVE
KETONES UA: NEGATIVE
LEUKOCYTES UA: NEGATIVE
Nitrite, UA: NEGATIVE
Protein, UA: NEGATIVE
SPEC GRAV UA: 1.015
Urobilinogen, UA: 0.2
pH, UA: 5.5

## 2015-06-27 LAB — BASIC METABOLIC PANEL
Anion gap: 12 (ref 5–15)
BUN: 9 mg/dL (ref 6–20)
CALCIUM: 9.3 mg/dL (ref 8.9–10.3)
CO2: 19 mmol/L — ABNORMAL LOW (ref 22–32)
Chloride: 106 mmol/L (ref 101–111)
Creatinine, Ser: 0.83 mg/dL (ref 0.44–1.00)
GFR calc Af Amer: >60 mL/min (ref >60)
GLUCOSE: 96 mg/dL (ref 65–99)
Potassium: 3.8 mmol/L (ref 3.5–5.1)
Sodium: 137 mmol/L (ref 135–145)

## 2015-06-27 LAB — URINALYSIS, ROUTINE W REFLEX MICROSCOPIC
BILIRUBIN URINE: NEGATIVE
Glucose, UA: NEGATIVE mg/dL
KETONES UR: NEGATIVE mg/dL
Leukocytes, UA: NEGATIVE
NITRITE: NEGATIVE
Protein, ur: NEGATIVE mg/dL
Specific Gravity, Urine: 1.015 (ref 1.005–1.030)
pH: 5.5 (ref 5.0–8.0)

## 2015-06-27 LAB — URINE MICROSCOPIC-ADD ON

## 2015-06-27 LAB — CBC
HCT: 41.9 % (ref 36.0–46.0)
Hemoglobin: 14.4 g/dL (ref 12.0–15.0)
MCH: 33.3 pg (ref 26.0–34.0)
MCHC: 34.4 g/dL (ref 30.0–36.0)
MCV: 97 fL (ref 78.0–100.0)
Platelets: 287 10*3/uL (ref 150–400)
RBC: 4.32 MIL/uL (ref 3.87–5.11)
RDW: 12 % (ref 11.5–15.5)
WBC: 4.1 10*3/uL (ref 4.0–10.5)

## 2015-06-27 LAB — POCT UA - MICROSCOPIC ONLY

## 2015-06-27 LAB — I-STAT BETA HCG BLOOD, ED (MC, WL, AP ONLY)

## 2015-06-27 MED ORDER — METHOCARBAMOL 500 MG PO TABS: 1000.0000 mg | ORAL_TABLET | Freq: Three times a day (TID) | ORAL | Status: DC | PRN

## 2015-06-27 MED ORDER — PANTOPRAZOLE SODIUM 40 MG PO TBEC
40.0000 mg | DELAYED_RELEASE_TABLET | Freq: Every day | ORAL | Status: DC
Start: 2015-06-27 — End: 2015-08-08

## 2015-06-27 MED ORDER — ALBUTEROL SULFATE (2.5 MG/3ML) 0.083% IN NEBU
2.5000 mg | INHALATION_SOLUTION | Freq: Once | RESPIRATORY_TRACT | Status: AC
  Administered 2015-06-27: 2.5 mg via RESPIRATORY_TRACT

## 2015-06-27 MED ORDER — METHOCARBAMOL 500 MG PO TABS
1000.0000 mg | ORAL_TABLET | Freq: Once | ORAL | Status: AC
Start: 2015-06-27 — End: 2015-06-27
  Administered 2015-06-27: 1000 mg via ORAL
  Filled 2015-06-27: qty 2

## 2015-06-27 MED ORDER — GI COCKTAIL ~~LOC~~
30.0000 mL | Freq: Once | ORAL | Status: AC
  Administered 2015-06-27: 30 mL via ORAL
  Filled 2015-06-27: qty 30

## 2015-06-27 MED ORDER — HYDROMORPHONE HCL 1 MG/ML IJ SOLN
2.0000 mg | Freq: Once | INTRAMUSCULAR | Status: AC
  Administered 2015-06-27: 2 mg via INTRAMUSCULAR
  Filled 2015-06-27: qty 2

## 2015-06-27 NOTE — ED Notes (Signed)
Pt sent here from MD's office for groin pain that radiates to spine.  Hx of chronic back pain that pt states has increased over the last week.  Pt states increased urination and chills.  Feels she has yeast infection.
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
## 2015-06-27 NOTE — Discharge Instructions (Signed)
Chronic Back Pain  When back pain lasts longer than 3 months, it is called chronic back pain.People with chronic back pain often go through certain periods that are more intense (flare-ups).  CAUSES Chronic back pain can be caused by wear and tear (degeneration) on different structures in your back. These structures include:  The bones of your spine (vertebrae) and the joints surrounding your spinal cord and nerve roots (facets).  The strong, fibrous tissues that connect your vertebrae (ligaments). Degeneration of these structures may result in pressure on your nerves. This can lead to constant pain. HOME CARE INSTRUCTIONS  Avoid bending, heavy lifting, prolonged sitting, and activities which make the problem worse.  Take brief periods of rest throughout the day to reduce your pain. Lying down or standing usually is better than sitting while you are resting.  Take over-the-counter or prescription medicines only as directed by your caregiver. SEEK IMMEDIATE MEDICAL CARE IF:   You have weakness or numbness in one of your legs or feet.  You have trouble controlling your bladder or bowels.  You have nausea, vomiting, abdominal pain, shortness of breath, or fainting.   This information is not intended to replace advice given to you by your health care provider. Make sure you discuss any questions you have with your health care provider.   Document Released: 04/01/2004 Document Revised: 05/17/2011 Document Reviewed: 08/12/2014 Elsevier Interactive Patient Education 2016 Elsevier Inc[employer]  Gastritis, Adult Gastritis is soreness and swelling (inflammation) of the lining of the stomach. Gastritis can develop as a sudden onset (acute) or long-term (chronic) condition. If gastritis is not treated, it can lead to stomach bleeding and ulcers. CAUSES  Gastritis occurs when the stomach lining is weak or damaged. Digestive juices from the stomach then inflame the weakened stomach lining. The stomach  lining may be weak or damaged due to viral or bacterial infections. One common bacterial infection is the Helicobacter pylori infection. Gastritis can also result from excessive alcohol consumption, taking certain medicines, or having too much acid in the stomach.  SYMPTOMS  In some cases, there are no symptoms. When symptoms are present, they may include:  Pain or a burning sensation in the upper abdomen.  Nausea.  Vomiting.  An uncomfortable feeling of fullness after eating. DIAGNOSIS  Your caregiver may suspect you have gastritis based on your symptoms and a physical exam. To determine the cause of your gastritis, your caregiver may perform the following:  Blood or stool tests to check for the H pylori bacterium.  Gastroscopy. A thin, flexible tube (endoscope) is passed down the esophagus and into the stomach. The endoscope has a light and camera on the end. Your caregiver uses the endoscope to view the inside of the stomach.  Taking a tissue sample (biopsy) from the stomach to examine under a microscope. TREATMENT  Depending on the cause of your gastritis, medicines may be prescribed. If you have a bacterial infection, such as an H pylori infection, antibiotics may be given. If your gastritis is caused by too much acid in the stomach, H2 blockers or antacids may be given. Your caregiver may recommend that you stop taking aspirin, ibuprofen, or other nonsteroidal anti-inflammatory drugs (NSAIDs). HOME CARE INSTRUCTIONS  Only take over-the-counter or prescription medicines as directed by your caregiver.  If you were given antibiotic medicines, take them as directed. Finish them even if you start to feel better.  Drink enough fluids to keep your urine clear or pale yellow.  Avoid foods and drinks that make  your symptoms worse, such as:  Caffeine or alcoholic drinks.  Chocolate.  Peppermint or mint flavorings.  Garlic and onions.  Spicy foods.  Citrus fruits, such as oranges,  lemons, or limes.  Tomato-based foods such as sauce, chili, salsa, and pizza.  Fried and fatty foods.  Eat small, frequent meals instead of large meals. SEEK IMMEDIATE MEDICAL CARE IF:   You have black or dark red stools.  You vomit blood or material that looks like coffee grounds.  You are unable to keep fluids down.  Your abdominal pain gets worse.  You have a fever.  You do not feel better after 1 week.  You have any other questions or concerns. MAKE SURE YOU:  Understand these instructions.  Will watch your condition.  Will get help right away if you are not doing well or get worse.   This information is not intended to replace advice given to you by your health care provider. Make sure you discuss any questions you have with your health care provider.   Document Released: 02/16/2001 Document Revised: 08/24/2011 Document Reviewed: 04/07/2011 Elsevier Interactive Patient Education [employer].

## 2015-06-27 NOTE — ED Notes (Signed)
Pt verbalized understanding of d/c instructions and has no further questions. Pt stable and NAD. Pt d/c home with her son driving.

## 2015-07-01 ENCOUNTER — Ambulatory Visit: Payer: Medicaid Other | Admitting: Family Medicine

## 2015-07-02 DIAGNOSIS — M549 Dorsalgia, unspecified: Secondary | ICD-10-CM | POA: Insufficient documentation

## 2015-07-02 DIAGNOSIS — G8929 Other chronic pain: Secondary | ICD-10-CM | POA: Insufficient documentation

## 2015-07-02 DIAGNOSIS — M545 Low back pain, unspecified: Secondary | ICD-10-CM | POA: Insufficient documentation

## 2015-07-02 NOTE — Assessment & Plan Note (Signed)
While awaiting transport to ED for back pain pt developed some SOB, this resolved with albuterol neb and oxygenation was normal throughout

## 2015-07-02 NOTE — Assessment & Plan Note (Signed)
H/o chronic back pain but presents with new severe back pain and midline tenderness with some dysuria, pt quite histrionic and exam limited by tearful hyper-reactive behavior - given midline tenderness and severity, pt wants to go to ED, I think this seems reasonable, will call for transport as pt states she cannot sit in chair for [place] to wheel her down

## 2015-07-02 NOTE — Assessment & Plan Note (Signed)
>>  ASSESSMENT AND PLAN FOR LOW BACK PAIN WRITTEN ON 07/02/2015  7:45 AM BY ADAMO, Jeris Penta, MD  H/o chronic back pain but presents with new severe back pain and midline tenderness with some dysuria, pt quite histrionic and exam limited by tearful hyper-reactive behavior - given midline tenderness and severity, pt wants to go to ED, I think this seems reasonable, will call for transport as pt states she cannot sit in chair for [place] to wheel her down

## 2015-07-02 NOTE — Progress Notes (Signed)
   Subjective:   [name] is a 50 y.o. female with a history of lymphedema, chronic low back pain here for acute worsening of back pain  Pt is very tearful and not able to give thorough history. She reports about 2 weeks of severe back pain that radiates from her right knee up through her RUQ to her her mid back. She states she has not been able to sleep, sit or walk as these things make the pain worse and her chronic meds do not help. She denies any fevers but does report some dysuria. She denies any bowel or bladder leakage or saddle anesthesia.  Review of Systems:  Per HPI. All other systems reviewed and are negative.   PMH, PSH, Medications, Allergies, and FmHx reviewed and updated in EMR.  Social History: former smoker  Objective:  BP 144/83 mmHg  Pulse 58  Temp(Src) 99.5 F (37.5 C) (Oral)  Ht 5\' 8"  (1.727 m)  Wt 106.641 kg  BMI 35.76 kg/m2  LMP 06/24/2015  Gen:  50 y.o. female in NAD HEENT: NCAT, MMM, anicteric sclerae, tearful and appears distressed and in pain CV: RRR, no MRG Resp: Non-labored, CTAB, no wheezes noted Abd: Soft, NTND MSK: Unable to fully flex or extend her back 2/2 pain, severe tenderness most pronounced at midline thoracic area but difficult to distinguish due to generalized tenderness and tearfulness Neuro: Alert and oriented, speech normal    Assessment & Plan:     [name] is a 50 y.o. female here for back pain  Acute back pain H/o chronic back pain but presents with new severe back pain and midline tenderness with some dysuria, pt quite histrionic and exam limited by tearful hyper-reactive behavior - given midline tenderness and severity, pt wants to go to ED, I think this seems reasonable, will call for transport as pt states she cannot sit in chair for [place] to wheel her down  COPD (chronic obstructive pulmonary disease) While awaiting transport to ED for back pain pt developed some SOB, this resolved with albuterol neb and oxygenation  was normal throughout   Beverlyn Roux, MD, MPH [place] PGY-3 07/02/2015 7:47 AM

## 2015-07-10 ENCOUNTER — Other Ambulatory Visit: Payer: Self-pay | Admitting: Family Medicine

## 2015-07-10 DIAGNOSIS — E785 Hyperlipidemia, unspecified: Secondary | ICD-10-CM

## 2015-07-21 ENCOUNTER — Ambulatory Visit (INDEPENDENT_AMBULATORY_CARE_PROVIDER_SITE_OTHER): Payer: Medicaid Other | Admitting: Family Medicine

## 2015-07-21 VITALS — BP 148/89 | HR 63 | Temp 98.0°F | Wt 240.6 lb

## 2015-07-21 DIAGNOSIS — R21 Rash and other nonspecific skin eruption: Secondary | ICD-10-CM | POA: Diagnosis present

## 2015-07-21 DIAGNOSIS — F411 Generalized anxiety disorder: Secondary | ICD-10-CM | POA: Diagnosis not present

## 2015-07-21 DIAGNOSIS — E890 Postprocedural hypothyroidism: Secondary | ICD-10-CM

## 2015-07-21 DIAGNOSIS — F418 Other specified anxiety disorders: Secondary | ICD-10-CM | POA: Diagnosis not present

## 2015-07-21 DIAGNOSIS — J449 Chronic obstructive pulmonary disease, unspecified: Secondary | ICD-10-CM | POA: Diagnosis not present

## 2015-07-21 DIAGNOSIS — E038 Other specified hypothyroidism: Secondary | ICD-10-CM

## 2015-07-21 DIAGNOSIS — R109 Unspecified abdominal pain: Secondary | ICD-10-CM | POA: Diagnosis not present

## 2015-07-21 DIAGNOSIS — L301 Dyshidrosis [pompholyx]: Secondary | ICD-10-CM | POA: Diagnosis not present

## 2015-07-21 DIAGNOSIS — R1011 Right upper quadrant pain: Secondary | ICD-10-CM

## 2015-07-21 DIAGNOSIS — G8929 Other chronic pain: Secondary | ICD-10-CM | POA: Diagnosis not present

## 2015-07-21 DIAGNOSIS — I890 Lymphedema, not elsewhere classified: Secondary | ICD-10-CM

## 2015-07-21 DIAGNOSIS — K219 Gastro-esophageal reflux disease without esophagitis: Secondary | ICD-10-CM | POA: Diagnosis not present

## 2015-07-21 DIAGNOSIS — E785 Hyperlipidemia, unspecified: Secondary | ICD-10-CM

## 2015-07-21 DIAGNOSIS — E039 Hypothyroidism, unspecified: Secondary | ICD-10-CM | POA: Diagnosis not present

## 2015-07-21 LAB — LIPID PANEL
CHOL/HDL RATIO: 2.6 ratio (ref <=5.0)
Cholesterol: 218 mg/dL — ABNORMAL HIGH (ref 125–200)
HDL: 83 mg/dL (ref >=46)
LDL CALC: 119 mg/dL (ref <130)
Triglycerides: 78 mg/dL (ref <150)
VLDL: 16 mg/dL (ref <30)

## 2015-07-21 LAB — TSH: TSH: 0.22 m[IU]/L — AB

## 2015-07-21 MED ORDER — OXYCODONE-ACETAMINOPHEN 10-325 MG PO TABS: 1.0000 | ORAL_TABLET | Freq: Four times a day (QID) | ORAL | Status: DC | PRN

## 2015-07-21 NOTE — Patient Instructions (Signed)
It has been a pleasure getting to know you over the past few years and I wish you all the best.

## 2015-07-24 NOTE — Assessment & Plan Note (Signed)
Recheck TSH today, will adjust synthroid dose based on results as needed. No symptoms currently.

## 2015-07-24 NOTE — Assessment & Plan Note (Signed)
Refilled percocet for chronic leg pain

## 2015-07-24 NOTE — Progress Notes (Signed)
   Subjective:   [name] is a 50 y.o. female with a history of lymphedema, chronic pain, recent abdominal pain here for f/u above issues  Pt reports her abdominal pain is dramatically improved since starting PPI a few weeks ago. She would like to talk with a GI specialist about this issue since her symptoms have not completely resolved  Chronic leg pain from lymphedema is stable and well controlled on her current regimen.   Review of Systems:  Per HPI. All other systems reviewed and are negative.   PMH, PSH, Medications, Allergies, and FmHx reviewed and updated in EMR.  Social History: former smoker  Objective:  BP 148/89 mmHg  Pulse 63  Temp(Src) 98 F (36.7 C) (Oral)  Wt 240 lb 9.6 oz (109.135 kg)  LMP 06/24/2015  Gen:  50 y.o. female in NAD HEENT: NCAT, MMM, anicteric sclerae CV: RRR, no MRG Resp: Non-labored, CTAB, no wheezes noted Abd: Soft, NTND, BS present, no guarding or organomegaly Ext: WWP, severe lymphedema of bilateral lower extremities Neuro: Alert and oriented, speech normal      Chemistry      Component Value Date/Time   NA 137 06/27/2015 1252   K 3.8 06/27/2015 1252   CL 106 06/27/2015 1252   CO2 19* 06/27/2015 1252   BUN 9 06/27/2015 1252   CREATININE 0.83 06/27/2015 1252   CREATININE 0.92 02/10/2015 1458      Component Value Date/Time   CALCIUM 9.3 06/27/2015 1252   ALKPHOS 56 02/10/2015 1458   AST 13 02/10/2015 1458   ALT 10 02/10/2015 1458   BILITOT 0.5 02/10/2015 1458      Lab Results  Component Value Date   WBC 4.1 06/27/2015   HGB 14.4 06/27/2015   HCT 41.9 06/27/2015   MCV 97.0 06/27/2015   PLT 287 06/27/2015   Lab Results  Component Value Date   TSH 0.22* 07/21/2015   Lab Results  Component Value Date   HGBA1C 5.1 01/01/2014   Assessment & Plan:     [name] is a 50 y.o. female here for pain f/u  Hypothyroidism Recheck TSH today, will adjust synthroid dose based on results as needed. No symptoms  currently.  Lymphedema Refilled percocet for chronic leg pain  Abdominal pain Pt reports significant improvement in abdominal pain since starting PPI. Requests GI referral for further evaluation as it is not completely resolved - discussed holding off on referral to see if symptoms resolve with another few weeks of PPI treatment given significant improvement      Beverlyn Roux, MD, MPH [place] PGY-3 07/24/2015 2:33 PM

## 2015-07-24 NOTE — Assessment & Plan Note (Signed)
Pt reports significant improvement in abdominal pain since starting PPI. Requests GI referral for further evaluation as it is not completely resolved - discussed holding off on referral to see if symptoms resolve with another few weeks of PPI treatment given significant improvement

## 2015-07-30 ENCOUNTER — Other Ambulatory Visit: Payer: Self-pay | Admitting: Family Medicine

## 2015-07-30 DIAGNOSIS — E038 Other specified hypothyroidism: Secondary | ICD-10-CM

## 2015-07-30 DIAGNOSIS — E890 Postprocedural hypothyroidism: Secondary | ICD-10-CM

## 2015-07-30 MED ORDER — LEVOTHYROXINE SODIUM 175 MCG PO TABS: 175.0000 ug | ORAL_TABLET | Freq: Every day | ORAL | Status: DC

## 2015-07-30 NOTE — Progress Notes (Signed)
Quick Note:  Tsh oversuppressed, decrease synthroid to 146mcg and recheck in 6 weeks. New rx sent to pharmacy and left VM to inform patient. ______

## 2015-08-08 ENCOUNTER — Other Ambulatory Visit: Payer: Self-pay | Admitting: Family Medicine

## 2015-08-08 DIAGNOSIS — K219 Gastro-esophageal reflux disease without esophagitis: Secondary | ICD-10-CM

## 2015-08-08 NOTE — Telephone Encounter (Signed)
Refill request for Protonix. Send to [facility].

## 2015-08-11 MED ORDER — PANTOPRAZOLE SODIUM 40 MG PO TBEC: 40.0000 mg | DELAYED_RELEASE_TABLET | Freq: Every day | ORAL | Status: DC

## 2015-10-10 ENCOUNTER — Other Ambulatory Visit: Payer: Self-pay | Admitting: Family Medicine

## 2015-10-10 NOTE — Telephone Encounter (Signed)
Pt is calling for a refill on her antibiotics for her infection that has come back. She has another abscess. Please call her when this is done so that she can pick this up. jw

## 2015-10-13 NOTE — Telephone Encounter (Signed)
Please schedule patient for appointment.

## 2015-10-15 ENCOUNTER — Ambulatory Visit: Payer: Medicaid Other | Admitting: Family Medicine

## 2015-10-15 NOTE — Telephone Encounter (Signed)
Patient scheduled with Dr. Nori Riis today.

## 2015-11-04 ENCOUNTER — Ambulatory Visit: Payer: Medicaid Other | Admitting: Family Medicine

## 2015-11-14 ENCOUNTER — Encounter: Payer: Self-pay | Admitting: Family Medicine

## 2015-11-14 ENCOUNTER — Ambulatory Visit (INDEPENDENT_AMBULATORY_CARE_PROVIDER_SITE_OTHER): Payer: Medicaid Other | Admitting: Family Medicine

## 2015-11-14 VITALS — BP 146/87 | HR 60 | Temp 98.5°F | Ht 67.5 in | Wt 234.8 lb

## 2015-11-14 DIAGNOSIS — I890 Lymphedema, not elsewhere classified: Secondary | ICD-10-CM

## 2015-11-14 DIAGNOSIS — M5126 Other intervertebral disc displacement, lumbar region: Secondary | ICD-10-CM

## 2015-11-14 DIAGNOSIS — R109 Unspecified abdominal pain: Secondary | ICD-10-CM | POA: Diagnosis not present

## 2015-11-14 DIAGNOSIS — F418 Other specified anxiety disorders: Secondary | ICD-10-CM | POA: Diagnosis not present

## 2015-11-14 DIAGNOSIS — J449 Chronic obstructive pulmonary disease, unspecified: Secondary | ICD-10-CM | POA: Diagnosis not present

## 2015-11-14 DIAGNOSIS — K219 Gastro-esophageal reflux disease without esophagitis: Secondary | ICD-10-CM | POA: Diagnosis not present

## 2015-11-14 DIAGNOSIS — L732 Hidradenitis suppurativa: Secondary | ICD-10-CM

## 2015-11-14 DIAGNOSIS — E038 Other specified hypothyroidism: Secondary | ICD-10-CM

## 2015-11-14 DIAGNOSIS — G8929 Other chronic pain: Secondary | ICD-10-CM | POA: Diagnosis not present

## 2015-11-14 DIAGNOSIS — R21 Rash and other nonspecific skin eruption: Secondary | ICD-10-CM | POA: Diagnosis present

## 2015-11-14 DIAGNOSIS — E039 Hypothyroidism, unspecified: Secondary | ICD-10-CM | POA: Diagnosis not present

## 2015-11-14 DIAGNOSIS — F411 Generalized anxiety disorder: Secondary | ICD-10-CM | POA: Diagnosis not present

## 2015-11-14 DIAGNOSIS — Z23 Encounter for immunization: Secondary | ICD-10-CM

## 2015-11-14 DIAGNOSIS — L301 Dyshidrosis [pompholyx]: Secondary | ICD-10-CM | POA: Diagnosis not present

## 2015-11-14 MED ORDER — PREGABALIN 150 MG PO CAPS: 150.0000 mg | ORAL_CAPSULE | Freq: Two times a day (BID) | ORAL | 4 refills | Status: DC

## 2015-11-14 MED ORDER — OXYCODONE-ACETAMINOPHEN 10-325 MG PO TABS: 1.0000 | ORAL_TABLET | Freq: Four times a day (QID) | ORAL | 0 refills | Status: DC | PRN

## 2015-11-14 MED ORDER — DOXYCYCLINE HYCLATE 100 MG PO TABS: 100.0000 mg | ORAL_TABLET | Freq: Two times a day (BID) | ORAL | 0 refills | Status: DC

## 2015-11-14 NOTE — Patient Instructions (Signed)
You were seen in clinic today for medication refills and for antibiotics.  You were prescribed Doxycycline and given 3 month refills on your Percoset. You were also given a flu shot and TSH was checked to see if your Synthroid dose needs to be adjusted. Please follow up in 4 weeks or sooner if needed.

## 2015-11-14 NOTE — Progress Notes (Signed)
Subjective:   Patient ID: [name]    DOB: [birth year], 50 y.o. female   MRN: [id number]  CC: medication refills   HPI: [name] is a 50 y.o. female who presents to clinic today for medication refills of her percoset and synthroid. Also complaining of a history of recurrent boils in groin area and have responded well to antibiotics in the past.  Usually takes Doxycycline for it with improvement.    Patient also c/o chronic pain in her legs due to lymphedema that is well controlled on current regimen.  Sleeps with legs elevated with 2 pillows and is difficult for her to ambulate.  Requesting percoset refill.   ROS: See HPI for pertinent ROS.  [place]: Pertinent past medical, surgical, family, and social history were reviewed and updated as appropriate. Smoking status reviewed.  Medications reviewed. Current Outpatient Prescriptions  Medication Sig Dispense Refill  . acetaminophen (TYLENOL) 500 MG tablet Take 1-2 tablets (500-1,000 mg total) by mouth every 6 (six) hours as needed for moderate pain or fever. (Patient not taking: Reported on 06/27/2015) 100 tablet 11  . albuterol (PROAIR HFA) 108 (90 BASE) MCG/ACT inhaler Inhale 2 puffs into the lungs every 4 (four) hours as needed for shortness of breath. 18 g 5  . clobetasol cream (TEMOVATE) 0.05 % Apply topically 2 (two) times daily. (Patient not taking: Reported on 06/27/2015) 60 g 6  . clonazePAM (KLONOPIN) 2 MG tablet Take 1 tablet (2 mg total) by mouth 2 (two) times daily as needed for anxiety. 60 tablet 5  . doxycycline (VIBRA-TABS) 100 MG tablet Take 1 tablet (100 mg total) by mouth 2 (two) times daily. 30 tablet 0  . DULoxetine (CYMBALTA) 60 MG capsule Take 1 capsule (60 mg total) by mouth daily. 90 capsule 4  . fluticasone (FLONASE) 50 MCG/ACT nasal spray SPRAY TWICE IN EACH NOSTRIL DAILY 16 g 5  . Fluticasone-Salmeterol (ADVAIR DISKUS) 250-50 MCG/DOSE AEPB Inhale 1 puff into the lungs 2 (two) times daily. 1 each 4  .  ibuprofen (ADVIL,MOTRIN) 200 MG tablet Take 400 mg by mouth at bedtime as needed.    Marland Kitchen levothyroxine (SYNTHROID, LEVOTHROID) 175 MCG tablet Take 1 tablet (175 mcg total) by mouth daily. 30 tablet 3  . methocarbamol (ROBAXIN) 500 MG tablet Take 2 tablets (1,000 mg total) by mouth every 8 (eight) hours as needed for muscle spasms. 30 tablet 0  . ondansetron (ZOFRAN) 4 MG tablet Take 1 tablet (4 mg total) by mouth every 8 (eight) hours as needed for nausea or vomiting. 30 tablet 1  . oxyCODONE-acetaminophen (PERCOCET) 10-325 MG tablet Take 1 tablet by mouth every 6 (six) hours as needed for pain. 120 tablet 0  . pantoprazole (PROTONIX) 40 MG tablet Take 1 tablet (40 mg total) by mouth daily. 90 tablet 0  . pravastatin (PRAVACHOL) 40 MG tablet TAKE 1 TABLET BY MOUTH EVERY DAY 90 tablet 3  . pregabalin (LYRICA) 150 MG capsule Take 1 capsule (150 mg total) by mouth 2 (two) times daily. 180 capsule 4  . sulindac (CLINORIL) 150 MG tablet Take 1 tablet (150 mg total) by mouth 2 (two) times daily. 60 tablet 3   No current facility-administered medications for this visit.     Objective:   BP (!) 146/87   Pulse 60   Temp 98.5 F (36.9 C) (Oral)   Ht 5' 7.5" (1.715 m)   Wt 234 lb 12.8 oz (106.5 kg)   BMI 36.23 kg/m  Vitals and nursing note  reviewed.  Gen: 50 y.o female in NAD HEENT: NCAT, MMM, anicteric sclerae CV: RRR, no MRG Resp: Non-labored, CTAB, no wheezes noted Abd: Soft, NTND, BS present, no guarding or organomegaly Ext: WWP, severe lymphedema of bilateral lower extremities Neuro: Alert and oriented, speech normal  Assessment & Plan:   50 y/o F presents to clinic for pain follow up  #Pain from lymphedema -Patient taking Percoset 10-325 as needed , refilled -side effects and goal of reducing some of her pain medications discussed at today's visit  #Hypothyroidism -TSH in May 2017 of 2.2. At that time, Synthroid was decreased to 175 mcg due to oversuppressed TSH.   -Repeat TSH  today and adjust dose as necessary -no symptoms currently  #Hydradenitis suppurativa -prescribed Doxycycline 100 mg bid -continue to follow -Advised using warm compresses to the area  Follow up in 4 weeks or sooner if necessary.  Lauren Kim, MD [place], PGY-1 11/14/2015 7:57 PM

## 2015-11-20 ENCOUNTER — Telehealth: Payer: Self-pay | Admitting: Family Medicine

## 2015-11-20 NOTE — Telephone Encounter (Signed)
PA for Lyrica 150 mg is pending per [place] [employer].  Derl Barrow, RN

## 2015-11-20 NOTE — Telephone Encounter (Signed)
Prior Authorization received from [facility] for oxycodone-acetaminophen 10-325 mg. Formulary and PA form placed in provider box for completion. Derl Barrow, RN

## 2015-11-20 NOTE — Telephone Encounter (Signed)
Needs pre authorization completed for percocet.  [facility] on [address]. Also her  lyrica which is filled [facility] on [address].  Please let pt know when this is done

## 2015-11-21 NOTE — Telephone Encounter (Signed)
Received PA approval for Oxycodone-Acetaminophen 10-325 mg via [place] [employer].  Med approved for 11/21/15 - 05/19/16.  [facility] pharmacy informed.  PA approval number [id number]. Derl Barrow, RN

## 2015-11-21 NOTE — Telephone Encounter (Signed)
Pt calls back she is leaving for [place] this AM.  Wanted to get meds before she left.  Advised that MD was not in clinic this am, she is not satisfied and request that we page her.    MD text page / # paged - no response.  Will forward to triage nurse.  Acheron Sugg, Salome Spotted, CMA

## 2015-11-21 NOTE — Telephone Encounter (Signed)
Received PA approval for Lyrica 150 mg via [place] [employer] for 30 day supply, #60.  Med approved for 11/21/15 - 11/15/16.  [facility] pharmacy informed.  PA approval number [id number].   PA for Oxycodone-Acetaminophen 10-325 mg is pending per [place] [employer].   Derl Barrow, RN

## 2015-12-05 ENCOUNTER — Other Ambulatory Visit: Payer: Self-pay | Admitting: Family Medicine

## 2015-12-05 DIAGNOSIS — F419 Anxiety disorder, unspecified: Secondary | ICD-10-CM

## 2015-12-05 DIAGNOSIS — F329 Major depressive disorder, single episode, unspecified: Secondary | ICD-10-CM

## 2015-12-05 DIAGNOSIS — F32A Depression, unspecified: Secondary | ICD-10-CM

## 2015-12-05 DIAGNOSIS — K219 Gastro-esophageal reflux disease without esophagitis: Secondary | ICD-10-CM

## 2015-12-05 NOTE — Telephone Encounter (Signed)
Pt is calling for a refill on her Protonix. jw

## 2015-12-08 MED ORDER — CLONAZEPAM 2 MG PO TABS: 2.0000 mg | ORAL_TABLET | Freq: Two times a day (BID) | ORAL | 5 refills | Status: DC | PRN

## 2015-12-08 MED ORDER — PANTOPRAZOLE SODIUM 40 MG PO TBEC: 40.0000 mg | DELAYED_RELEASE_TABLET | Freq: Every day | ORAL | 0 refills | Status: DC

## 2016-01-07 DEATH — deceased

## 2016-02-13 ENCOUNTER — Ambulatory Visit: Payer: Medicaid Other | Admitting: Family Medicine

## 2016-02-20 ENCOUNTER — Ambulatory Visit (INDEPENDENT_AMBULATORY_CARE_PROVIDER_SITE_OTHER): Payer: Medicaid Other | Admitting: Family Medicine

## 2016-02-20 ENCOUNTER — Encounter: Payer: Self-pay | Admitting: Family Medicine

## 2016-02-20 VITALS — BP 110/74 | HR 68 | Temp 98.1°F | Wt 231.0 lb

## 2016-02-20 DIAGNOSIS — G8929 Other chronic pain: Secondary | ICD-10-CM | POA: Diagnosis not present

## 2016-02-20 DIAGNOSIS — G2581 Restless legs syndrome: Secondary | ICD-10-CM

## 2016-02-20 DIAGNOSIS — F418 Other specified anxiety disorders: Secondary | ICD-10-CM | POA: Diagnosis not present

## 2016-02-20 DIAGNOSIS — R109 Unspecified abdominal pain: Secondary | ICD-10-CM | POA: Diagnosis not present

## 2016-02-20 DIAGNOSIS — E890 Postprocedural hypothyroidism: Secondary | ICD-10-CM

## 2016-02-20 DIAGNOSIS — E039 Hypothyroidism, unspecified: Secondary | ICD-10-CM | POA: Diagnosis not present

## 2016-02-20 DIAGNOSIS — K219 Gastro-esophageal reflux disease without esophagitis: Secondary | ICD-10-CM | POA: Diagnosis not present

## 2016-02-20 DIAGNOSIS — E038 Other specified hypothyroidism: Secondary | ICD-10-CM | POA: Diagnosis not present

## 2016-02-20 DIAGNOSIS — J449 Chronic obstructive pulmonary disease, unspecified: Secondary | ICD-10-CM | POA: Diagnosis not present

## 2016-02-20 DIAGNOSIS — F411 Generalized anxiety disorder: Secondary | ICD-10-CM | POA: Diagnosis not present

## 2016-02-20 DIAGNOSIS — L301 Dyshidrosis [pompholyx]: Secondary | ICD-10-CM | POA: Diagnosis not present

## 2016-02-20 DIAGNOSIS — R21 Rash and other nonspecific skin eruption: Secondary | ICD-10-CM | POA: Diagnosis present

## 2016-02-20 DIAGNOSIS — I890 Lymphedema, not elsewhere classified: Secondary | ICD-10-CM | POA: Diagnosis not present

## 2016-02-20 LAB — TSH: TSH: 27.24 mIU/L — ABNORMAL HIGH

## 2016-02-20 MED ORDER — OXYCODONE-ACETAMINOPHEN 10-325 MG PO TABS: 1.0000 | ORAL_TABLET | Freq: Four times a day (QID) | ORAL | 0 refills | Status: DC | PRN

## 2016-02-20 MED ORDER — OXYCODONE-ACETAMINOPHEN 10-325 MG PO TABS
1.0000 | ORAL_TABLET | Freq: Four times a day (QID) | ORAL | 0 refills | Status: DC | PRN
Start: 2016-02-20 — End: 2016-02-20

## 2016-02-20 MED ORDER — LEVOTHYROXINE SODIUM 175 MCG PO TABS: 175.0000 ug | ORAL_TABLET | Freq: Every day | ORAL | 3 refills | Status: DC

## 2016-02-20 MED ORDER — FLUOXETINE HCL 10 MG PO TABS: 10.0000 mg | ORAL_TABLET | Freq: Every day | ORAL | 3 refills | Status: DC

## 2016-02-20 MED ORDER — ROPINIROLE HCL 0.25 MG PO TABS: 0.2500 mg | ORAL_TABLET | Freq: Once | ORAL | 2 refills | Status: DC

## 2016-02-20 NOTE — Patient Instructions (Addendum)
You were seen in clinic for restless leg syndrome. You will start a new medication called Ropinirole which you should take one hour before bedtime.  If after one week you feel as though it is not helping much, you may take 2 pills at bedtime.

## 2016-02-20 NOTE — Progress Notes (Signed)
Subjective:   Patient ID: [name]    DOB: 20-Apr-1965, 50 y.o. female   MRN: [id number]  CC: medication refill  HPI: [name] is a 50 y.o. female who presents to clinic today for refills. Problems discussed today are as follows:   Patient with history of chronic lymphedema.  Takes oxycodone for pain and has been taking Lyrica to help with nerve pain.  States she does not wish to continue Lyrica anymore and would like to try something different for the restlessness in her legs. This has been bothering her for the past few weeks.  Seems to be worse at night.  Feels anxious and as though she cannot keep them still.    Would also like refill on Synthroid.  Last TSH May 2017 was 0.22.    ROS: See HPI for pertinent ROS.  [place]: Pertinent past medical, surgical, family, and social history were reviewed and updated as appropriate. Smoking status reviewed.  Medications reviewed. Current Outpatient Prescriptions  Medication Sig Dispense Refill  . acetaminophen (TYLENOL) 500 MG tablet Take 1-2 tablets (500-1,000 mg total) by mouth every 6 (six) hours as needed for moderate pain or fever. (Patient not taking: Reported on 06/27/2015) 100 tablet 11  . albuterol (PROAIR HFA) 108 (90 BASE) MCG/ACT inhaler Inhale 2 puffs into the lungs every 4 (four) hours as needed for shortness of breath. 18 g 5  . clobetasol cream (TEMOVATE) 0.05 % Apply topically 2 (two) times daily. (Patient not taking: Reported on 06/27/2015) 60 g 6  . doxycycline (VIBRA-TABS) 100 MG tablet Take 1 tablet (100 mg total) by mouth 2 (two) times daily. 30 tablet 0  . DULoxetine (CYMBALTA) 60 MG capsule Take 1 capsule (60 mg total) by mouth daily. 90 capsule 4  . FLUoxetine (PROZAC) 10 MG tablet Take 1 tablet (10 mg total) by mouth daily. 60 tablet 3  . fluticasone (FLONASE) 50 MCG/ACT nasal spray SPRAY TWICE IN EACH NOSTRIL DAILY 16 g 5  . Fluticasone-Salmeterol (ADVAIR DISKUS) 250-50 MCG/DOSE AEPB Inhale 1 puff into the  lungs 2 (two) times daily. 1 each 4  . ibuprofen (ADVIL,MOTRIN) 200 MG tablet Take 400 mg by mouth at bedtime as needed.    Marland Kitchen levothyroxine (SYNTHROID, LEVOTHROID) 175 MCG tablet Take 1 tablet (175 mcg total) by mouth daily. 30 tablet 3  . methocarbamol (ROBAXIN) 500 MG tablet Take 2 tablets (1,000 mg total) by mouth every 8 (eight) hours as needed for muscle spasms. 30 tablet 0  . ondansetron (ZOFRAN) 4 MG tablet Take 1 tablet (4 mg total) by mouth every 8 (eight) hours as needed for nausea or vomiting. 30 tablet 1  . oxyCODONE-acetaminophen (PERCOCET) 10-325 MG tablet Take 1 tablet by mouth every 6 (six) hours as needed for pain. 120 tablet 0  . pantoprazole (PROTONIX) 40 MG tablet Take 1 tablet (40 mg total) by mouth daily. 90 tablet 0  . pravastatin (PRAVACHOL) 40 MG tablet TAKE 1 TABLET BY MOUTH EVERY DAY 90 tablet 3  . pregabalin (LYRICA) 150 MG capsule Take 1 capsule (150 mg total) by mouth 2 (two) times daily. 180 capsule 4  . rOPINIRole (REQUIP) 0.25 MG tablet Take 1 tablet (0.25 mg total) by mouth once. 30 tablet 2  . sulindac (CLINORIL) 150 MG tablet Take 1 tablet (150 mg total) by mouth 2 (two) times daily. 60 tablet 3   No current facility-administered medications for this visit.    Objective:   BP 110/74   Pulse 68  Temp 98.1 F (36.7 C) (Oral)   Wt 231 lb (104.8 kg)   LMP 02/19/2016   SpO2 99%   BMI 35.65 kg/m  Vitals and nursing note reviewed.  General: well nourished, well developed, in no acute distress with non-toxic appearance HEENT: NCAT, MMM Neck: supple, non-tender without LAD CV: RRR, no MRG Lungs: CTA B/L with normal work of breathing  Abdomen: soft, non-tender, no masses or organomegaly palpable, +bs Skin: warm, dry, no rashes or lesions, cap refill < 2 seconds Extremities: warm , severe lymphedema of bilateral lower extremities Psych: mood appropriate  Assessment & Plan:   Lymphedema -Patient taking Percoset 10-325 as needed , refilled for 3 months  as difficult for her to ambulate to appointments -discussed at today's visit goals of reducing her pain medications -Continue to monitor  Hypothyroidism -TSH in May 2017 of 0.22. At that time, Synthroid was decreased to 175 mcg due to oversuppressed TSH.   -Repeat TSH today ordered but was not performed.  -reports she is asymptomatic at this time -TSH today and adjust dose as necessary -Continue to monitor  Restless legs Likely 2/2 to chronic lymphedema. Pt taking Lyrica but wishes to switch to a different medication.  -Prescribed Ropinirole 0.25 mg daily.  May take for one week and if no improvement, take 2 tabs daily at bedtime.  -Advised to reduce caffeine, patient is a nonsmoker.  Recommended relaxation techniques in addition to pharmacologic therapy.  -Continue to monitor  Orders Placed This Encounter  Procedures  . TSH   Meds ordered this encounter  Medications  . DISCONTD: oxyCODONE-acetaminophen (PERCOCET) 10-325 MG tablet    Sig: Take 1 tablet by mouth every 6 (six) hours as needed for pain.    Dispense:  120 tablet    Refill:  0    Space refills 30 days apart. This is refill 3 of 3.  . levothyroxine (SYNTHROID, LEVOTHROID) 175 MCG tablet    Sig: Take 1 tablet (175 mcg total) by mouth daily.    Dispense:  30 tablet    Refill:  3  . rOPINIRole (REQUIP) 0.25 MG tablet    Sig: Take 1 tablet (0.25 mg total) by mouth once.    Dispense:  30 tablet    Refill:  2  . FLUoxetine (PROZAC) 10 MG tablet    Sig: Take 1 tablet (10 mg total) by mouth daily.    Dispense:  60 tablet    Refill:  3  . DISCONTD: oxyCODONE-acetaminophen (PERCOCET) 10-325 MG tablet    Sig: Take 1 tablet by mouth every 6 (six) hours as needed for pain.    Dispense:  120 tablet    Refill:  0    Space refills 30 days apart. This is refill 2 of 3.  . oxyCODONE-acetaminophen (PERCOCET) 10-325 MG tablet    Sig: Take 1 tablet by mouth every 6 (six) hours as needed for pain.    Dispense:  120 tablet     Refill:  0    Space refills 30 days apart. This is refill 1 of 3.   Lauren Kim, MD [place], PGY-1 02/23/2016 5:42 PM

## 2016-02-20 NOTE — Progress Notes (Signed)
medication

## 2016-02-23 DIAGNOSIS — G2581 Restless legs syndrome: Secondary | ICD-10-CM | POA: Insufficient documentation

## 2016-02-23 NOTE — Assessment & Plan Note (Addendum)
-  TSH in May 2017 of 0.22. At that time, Synthroid was decreased to 175 mcg due to oversuppressed TSH.   -Repeat TSH today ordered but was not performed.  -reports she is asymptomatic at this time -TSH today and adjust dose as necessary -Continue to monitor

## 2016-02-23 NOTE — Assessment & Plan Note (Signed)
Likely 2/2 to chronic lymphedema. Pt taking Lyrica but wishes to switch to a different medication.  -Prescribed Ropinirole 0.25 mg daily.  May take for one week and if no improvement, take 2 tabs daily at bedtime.  -Advised to reduce caffeine, patient is a nonsmoker.  Recommended relaxation techniques in addition to pharmacologic therapy.  -Continue to monitor

## 2016-02-23 NOTE — Assessment & Plan Note (Addendum)
-  Patient taking Percoset 10-325 as needed , refilled for 3 months as difficult for her to ambulate to appointments -discussed at today's visit goals of reducing her pain medications -Continue to monitor

## 2016-03-03 ENCOUNTER — Other Ambulatory Visit: Payer: Self-pay | Admitting: *Deleted

## 2016-03-03 MED ORDER — FLUOXETINE HCL 10 MG PO TABS: 10.0000 mg | ORAL_TABLET | Freq: Every day | ORAL | 3 refills | Status: DC

## 2016-04-13 ENCOUNTER — Other Ambulatory Visit: Payer: Self-pay | Admitting: Family Medicine

## 2016-04-15 ENCOUNTER — Telehealth: Payer: Self-pay | Admitting: *Deleted

## 2016-04-15 MED ORDER — FLUTICASONE-SALMETEROL 250-50 MCG/DOSE IN AEPB: 1.0000 | INHALATION_SPRAY | Freq: Two times a day (BID) | RESPIRATORY_TRACT | 4 refills | Status: DC

## 2016-04-15 NOTE — Telephone Encounter (Signed)
Pt states she needs her albuterol filled ASAP, is having breathing issues. Pt also needs the inhaler for copd as well, the purple disk. ep

## 2016-04-15 NOTE — Addendum Note (Signed)
Addended by: Valerie Roys on: 04/15/2016 11:25 AM   Modules accepted: Orders

## 2016-04-15 NOTE — Telephone Encounter (Signed)
Spoke with MD and informed her that patient also needed her advair and received verbal ok for this. [name],CMA

## 2016-04-15 NOTE — Telephone Encounter (Signed)
Prior Authorization received from [employer] for [employer]. Formulary and PA form placed in provider box for completion. Derl Barrow, RN

## 2016-04-16 NOTE — Telephone Encounter (Signed)
PA was approved for Advair Diskus 250-50 mg via [place] [facility] until 04/16/16.  Approval number: [id number].  Derl Barrow, RN

## 2016-04-20 ENCOUNTER — Ambulatory Visit: Payer: Medicaid Other | Admitting: Family Medicine

## 2016-04-20 ENCOUNTER — Telehealth: Payer: Self-pay

## 2016-04-20 NOTE — Telephone Encounter (Signed)
Would like to speak with Dr. Reesa Chew. Would like to stop prozac and wants to know the best way to do that. Please call [phone]. Had to cancel today's appt. Will call back to reschedule. Ottis Stain, CMA

## 2016-04-23 NOTE — Telephone Encounter (Signed)
Called patient to discuss how to go about stopping Prozac.  Patient informed me she has not been taking it regularly.  She is on low dose (10 mg daily).  Told her she can go ahead and stop taking it.   Thanks!

## 2016-05-03 ENCOUNTER — Ambulatory Visit: Payer: Medicaid Other | Admitting: Family Medicine

## 2016-05-10 ENCOUNTER — Telehealth: Payer: Self-pay | Admitting: Family Medicine

## 2016-05-10 NOTE — Telephone Encounter (Signed)
Pt has been calling and looking for a cancellation  with PCP. Pt has an appointment on 3/20, but states she can not wait that long. Pt only wants to see PCP. Pt is having issues with menopause and it is starting to get the best of her. Pt wants to know if PCP can squeeze her into her schedule somewhere and call pt and let her know. ep

## 2016-05-18 ENCOUNTER — Encounter: Payer: Self-pay | Admitting: Internal Medicine

## 2016-05-18 ENCOUNTER — Telehealth: Payer: Self-pay | Admitting: Internal Medicine

## 2016-05-18 NOTE — Telephone Encounter (Signed)
[facility] After Hours Telephone Line   Number received via page: [phone] Person calling: Smith Mcnicholas Relationship to patient: Self  Reason for call: Menopause symptoms  Patient calling after hours line saying that she needs to be seen by Dr. Reesa Chew as soon as possible. Reports that she is having "menopause symptoms" and that her "nerves are shot." Denied depressive symptoms, suicidal or homicidal ideation. Discussed with patient that she already has an appointment scheduled with Dr. Reesa Chew one week from now, and I feel that she is stable to wait until then to see Dr. Reesa Chew, however if she would like to schedule a same day appointment tomorrow she is welcome to call the clinic in the morning. Patient says she cannot wait one week and will call to schedule same day appointment tomorrow morning.   Adin Hector, MD, MPH PGY-2 [facility] Pager [phone]

## 2016-05-19 ENCOUNTER — Telehealth: Payer: Self-pay | Admitting: Family Medicine

## 2016-05-19 ENCOUNTER — Ambulatory Visit (INDEPENDENT_AMBULATORY_CARE_PROVIDER_SITE_OTHER): Payer: Medicaid Other | Admitting: Family Medicine

## 2016-05-19 ENCOUNTER — Encounter: Payer: Self-pay | Admitting: Family Medicine

## 2016-05-19 VITALS — BP 130/90 | HR 62 | Temp 98.2°F | Ht 67.5 in | Wt 238.6 lb

## 2016-05-19 DIAGNOSIS — R21 Rash and other nonspecific skin eruption: Secondary | ICD-10-CM | POA: Diagnosis not present

## 2016-05-19 DIAGNOSIS — K219 Gastro-esophageal reflux disease without esophagitis: Secondary | ICD-10-CM | POA: Diagnosis not present

## 2016-05-19 DIAGNOSIS — R109 Unspecified abdominal pain: Secondary | ICD-10-CM | POA: Diagnosis not present

## 2016-05-19 DIAGNOSIS — F411 Generalized anxiety disorder: Secondary | ICD-10-CM | POA: Diagnosis not present

## 2016-05-19 DIAGNOSIS — E890 Postprocedural hypothyroidism: Secondary | ICD-10-CM | POA: Diagnosis not present

## 2016-05-19 DIAGNOSIS — L301 Dyshidrosis [pompholyx]: Secondary | ICD-10-CM | POA: Diagnosis not present

## 2016-05-19 DIAGNOSIS — E039 Hypothyroidism, unspecified: Secondary | ICD-10-CM | POA: Diagnosis not present

## 2016-05-19 DIAGNOSIS — E038 Other specified hypothyroidism: Secondary | ICD-10-CM | POA: Diagnosis not present

## 2016-05-19 DIAGNOSIS — F39 Unspecified mood [affective] disorder: Secondary | ICD-10-CM

## 2016-05-19 DIAGNOSIS — J449 Chronic obstructive pulmonary disease, unspecified: Secondary | ICD-10-CM | POA: Diagnosis not present

## 2016-05-19 DIAGNOSIS — F418 Other specified anxiety disorders: Secondary | ICD-10-CM | POA: Diagnosis not present

## 2016-05-19 DIAGNOSIS — G8929 Other chronic pain: Secondary | ICD-10-CM | POA: Diagnosis not present

## 2016-05-19 LAB — T4, FREE: FREE T4: 1 ng/dL (ref 0.8–1.8)

## 2016-05-19 LAB — TSH: TSH: 7.5 mIU/L — ABNORMAL HIGH

## 2016-05-19 MED ORDER — LEVOTHYROXINE SODIUM 175 MCG PO TABS: 175.0000 ug | ORAL_TABLET | Freq: Every day | ORAL | 3 refills | Status: DC

## 2016-05-19 MED ORDER — QUETIAPINE FUMARATE 50 MG PO TABS: 50.0000 mg | ORAL_TABLET | Freq: Every day | ORAL | 0 refills | Status: DC

## 2016-05-19 NOTE — Patient Instructions (Signed)
Menopause and Herbal Products What is menopause? Menopause is the normal time of life when menstrual periods decrease in frequency and eventually stop completely. This process can take several years for some women. Menopause is complete when you have had an absence of menstruation for a full year since your last menstrual period. It usually occurs between the ages of 48 and 55. It is not common for menopause to begin before the age of 40. During menopause, your body stops producing the female hormones estrogen and progesterone. Common symptoms associated with this loss of hormones (vasomotor symptoms) are:  Hot flashes.  Hot flushes.  Night sweats.  Other common symptoms and complications of menopause include:  Decrease in sex drive.  Vaginal dryness and thinning of the walls of the vagina. This can make sex painful.  Dryness of the skin and development of wrinkles.  Headaches.  Tiredness.  Irritability.  Memory problems.  Weight gain.  Bladder infections.  Hair growth on the face and chest.  Inability to reproduce offspring (infertility).  Loss of density in the bones (osteoporosis) increasing your risk for breaks (fractures).  Depression.  Hardening and narrowing of the arteries (atherosclerosis). This increases your risk of heart attack and stroke.  What treatment options are available? There are many treatment choices for menopause symptoms. The most common treatment is hormone replacement therapy. Many alternative therapies for menopause are emerging, including the use of herbal products. These supplements can be found in the form of herbs, teas, oils, tinctures, and pills. Common herbal supplements for menopause are made from plants that contain phytoestrogens. Phytoestrogens are compounds that occur naturally in plants and plant products. They act like estrogen in the body. Foods and herbs that contain phytoestrogens include:  Soy.  Flax seeds.  Red  clover.  Ginseng.  What menopause symptoms may be helped if I use herbal products?  Vasomotor symptoms. These may be helped by: ? Soy. Some studies show that soy may have a moderate benefit for hot flashes. ? Black cohosh. There is limited evidence indicating this may be beneficial for hot flashes.  Symptoms that are related to heart and blood vessel disease. These may be helped by soy. Studies have shown that soy can help to lower cholesterol.  Depression. This may be helped by: ? St. John's wort. There is limited evidence that shows this may help mild to moderate depression. ? Black cohosh. There is evidence that this may help depression and mood swings.  Osteoporosis. Soy may help to decrease bone loss that is associated with menopause and may prevent osteoporosis. Limited evidence indicates that red clover may offer some bone loss protection as well. Other herbal products that are commonly used during menopause lack enough evidence to support their use as a replacement for conventional menopause therapies. These products include evening primrose, ginseng, and red clover. What are the cases when herbal products should not be used during menopause? Do not use herbal products during menopause without your health care provider's approval if:  You are taking medicine.  You have a preexisting liver condition.  Are there any risks in my taking herbal products during menopause? If you choose to use herbal products to help with symptoms of menopause, keep in mind that:  Different supplements have different and unmeasured amounts of herbal ingredients.  Herbal products are not regulated the same way that medicines are.  Concentrations of herbs may vary depending on the way they are prepared. For example, the concentration may be different in a pill,   tea, oil, and tincture.  Little is known about the risks of using herbal products, particularly the risks of long-term use.  Some herbal  supplements can be harmful when combined with certain medicines.  Most commonly reported side effects of herbal products are mild. However, if used improperly, many herbal supplements can cause serious problems. Talk to your health care provider before starting any herbal product. If problems develop, stop taking the supplement and let your health care provider know. This information is not intended to replace advice given to you by your health care provider. Make sure you discuss any questions you have with your health care provider. Document Released: 08/11/2007 Document Revised: 01/20/2016 Document Reviewed: 08/07/2013 Elsevier Interactive Patient Education  2017 Elsevier Inc.  

## 2016-05-19 NOTE — Assessment & Plan Note (Addendum)
  Patient expressing depression, anxiety, grief over mother's death, and insomnia/components of mania. Has been having mood outbursts. Likely related to stopping prozac 1 month ago.   -check TSH, CMP -seroquel 50mg  every night for sleep/mood -follow up w/ pcp in 1 week as scheduled -recommend checking UDS -can titrate up seroquel as needed

## 2016-05-19 NOTE — Assessment & Plan Note (Addendum)
  Last TSH elevated to 27  -will recheck TSH today -may need to do free T4 at next visit -continue current synthroid dose -recheck synthroid

## 2016-05-19 NOTE — Progress Notes (Signed)
    Subjective:    Patient ID: [name], female    DOB: 1965-05-06, 51 y.o.   MRN: [id number]   CC: outbursts past 3 weeks  Feels this is menopause related. Has been spotting irregularly "twice a month" for past several months. Just finished spotting yesterday. Last "real" period was 3 months ago. Mom went through menopause around the same age.   Has not been sleeping. Maybe 1-2 hours a night. She is scared. She yelled at her grandson which is abnormal for her. Has been having irritability with family members.   Mom passed away on the May 24, 2022 and she needed to get out of the house today. Repeating that she is scared something bad will happened. She is scared to continue feeling this way. Denies SI, HI. Endorses anxiety, grief. Hx of depression not currently on any medications. Stopped Prozac 1 month ago because it wasn't helping. Hs been on a variety of psych meds that have never helped. Does not think this is psych related, thinks this is related to menopause. She does not want to speak with behavioral health today "sick of counseling". Asking for blood test to diagnose menopause.  Also asked for benzo prescription and birth control.   When asked what I can do today to make her feel better she states she just wants to be happy again.   Smoking status reviewed- former smoker  Objective:  BP 130/90   Pulse 62   Temp 98.2 F (36.8 C) (Oral)   Ht 5' 7.5" (1.715 m)   Wt 238 lb 9.6 oz (108.2 kg)   SpO2 97%   BMI 36.82 kg/m  Vitals and nursing note reviewed  General: well nourished, in no acute distress HEENT: normocephalic, no scleral icterus or conjunctival pallor, no nasal discharge, moist mucous membranes Cardiac: RRR, clear S1 and S2, no murmurs, rubs, or gallops Respiratory: clear to auscultation bilaterally, no increased work of breathing Abdomen: soft, nontender, nondistended, +BS Extremities: no edema or cyanosis. Neuro: alert and oriented, no focal deficits Psych: speech  pressured, tangential. Denies SI, HI. Reports feelings of worthlessness and guilt.   Assessment & Plan:    Episodic mood disorder (HCC)  Patient expressing depression, anxiety, grief over mother's death, and insomnia/components of mania. Has been having mood outbursts. Likely related to stopping prozac 1 month ago.   -check TSH, CMP -seroquel 50mg  every night for sleep/mood -follow up w/ pcp in 1 week as scheduled -recommend checking UDS -can titrate up seroquel as needed  Hypothyroidism  Last TSH elevated to 27  -will recheck TSH today -may need to do free T4 at next visit -continue current synthroid dose -recheck synthroid    Return in about 1 week (around 05/26/2016).   Lucila Maine, DO Family Medicine Resident PGY-1

## 2016-05-19 NOTE — Telephone Encounter (Signed)
Pt needs refills on her Protonix, Oxycodone, Lyrica, and Levothyroxie. jw

## 2016-05-19 NOTE — Telephone Encounter (Signed)
Percocet and Lyrica need the 6 month approval that Medicaid requires. She normally gets and wants 3 mo supply for each prescription bc of transportation issues. Can you call the pharmacist to get approved Chiropractor)?  She needs this done tomorrow so she can pick up before she runs out on 16th.  She has appt next week but will be out before then.  Thank you.

## 2016-05-21 ENCOUNTER — Ambulatory Visit: Payer: Medicaid Other | Admitting: Internal Medicine

## 2016-05-21 ENCOUNTER — Telehealth: Payer: Self-pay | Admitting: Family Medicine

## 2016-05-21 NOTE — Telephone Encounter (Signed)
  Spoke with [name] about her thyroid labs. Free T4 normal, TSH mildly elevated. Recommended rechecking periodically with PCP. She is glad it's ok. She feels a bit better. Got seroquel, slept a bit better afterward. No other questions at this time.

## 2016-05-25 ENCOUNTER — Ambulatory Visit (INDEPENDENT_AMBULATORY_CARE_PROVIDER_SITE_OTHER): Payer: Medicaid Other | Admitting: Family Medicine

## 2016-05-25 ENCOUNTER — Encounter: Payer: Self-pay | Admitting: Family Medicine

## 2016-05-25 VITALS — BP 138/102 | HR 66 | Temp 98.3°F | Ht 68.0 in | Wt 238.8 lb

## 2016-05-25 DIAGNOSIS — Z23 Encounter for immunization: Secondary | ICD-10-CM | POA: Diagnosis not present

## 2016-05-25 DIAGNOSIS — I890 Lymphedema, not elsewhere classified: Secondary | ICD-10-CM | POA: Diagnosis not present

## 2016-05-25 DIAGNOSIS — M5126 Other intervertebral disc displacement, lumbar region: Secondary | ICD-10-CM

## 2016-05-25 DIAGNOSIS — F39 Unspecified mood [affective] disorder: Secondary | ICD-10-CM | POA: Diagnosis not present

## 2016-05-25 MED ORDER — PREGABALIN 150 MG PO CAPS: 150.0000 mg | ORAL_CAPSULE | Freq: Two times a day (BID) | ORAL | 4 refills | Status: DC

## 2016-05-25 MED ORDER — ALBUTEROL SULFATE HFA 108 (90 BASE) MCG/ACT IN AERS: 2.0000 | INHALATION_SPRAY | RESPIRATORY_TRACT | 5 refills | Status: DC | PRN

## 2016-05-25 MED ORDER — OXYCODONE-ACETAMINOPHEN 10-325 MG PO TABS: 1.0000 | ORAL_TABLET | Freq: Three times a day (TID) | ORAL | 0 refills | Status: DC | PRN

## 2016-05-25 NOTE — Progress Notes (Signed)
Dr. Reesa Chew requested a [place].   Presenting Issue:  Symptoms of depressed mood and irritability, particularly around menstruation  Issues Discussed: Met with patient briefly today as she had to catch her bus. Purpose of this visit was to make initial introduction to patient and schedule for appointment with [facility]. [facility] spoke with patient about [facility] services, explained that the goal of our visit next week is to gain some diagnostic clarity so her doctors can provide her the best treatment possible. When I mentioned the potential of a more thorough evaluation elsewhere, she stated that she is not interested in anything like that and does not want anyone telling her that she's going crazy. She's open to meeting next week, so we scheduled that appointment for Tuesday at 2:30pm. I'll continue to follow her and work with Dr. Reesa Chew on her care plan.   Warmhandoff:    Warm Hand Off Completed.

## 2016-05-25 NOTE — Patient Instructions (Signed)
It was nice seeing you again! You were seen in clinic today for concerns that you are going through menopause.  You were started on a medication called Seroquel last Friday which may help with your changes in mood and increased irritability.  Given that you are have tried that as well as Zoloft with no difference, I would like for you to see behavioral health. This is to be evaluated to see if starting another medication may help the symptoms you experience surrounding your period.  I would like for you to follow up with them at our clinic.   As Dr. Erin Hearing and I discussed, I have given you a prescription for 40 tablets of Percoset and would like for you to follow up with me in 2 weeks once you have met with behavioral health .

## 2016-05-25 NOTE — Progress Notes (Signed)
Subjective:   Patient ID: [name]    DOB: 1965-09-19, 51 y.o. female   MRN: [id number]  CC: feels she is going through menopause   HPI: [name] is a 51 y.o. female who presents to clinic today c/o mood changes.  ?Manic episode Recently stopped her Zoloft.  Reports she  was not taking it because she doesn't like being on lot of medications.  Was seeb by Dr. Vanetta Shawl this past Friday and started on Seroquel.   Has been taking it for past 5 days but notes she feels nauseous and itchy.  The Seroquel has increased her appetite.  Fears she is allergic to it.  No change in mood.  Still having outbursts.  Episodes only occur prior to period. Used to have bad outbursts with crying and this brought on her depression. Felt like this for years but worsening.  Yelled at her grandson and feels moody to the extreme.  Instead of speaking she yells.   Saw Dr. Sherril Cong and she was put on pain regimen with which she felt a lot better.   Feels like she is going through menopause.    Reports her mom passed away on 02/18/15.  Feels like her mom's passing has taken a toll on her.  Usually deals with it fine until the week before her period and then starts feeling irritable, experiences hot flashes, sweats at night, no headaches. Denies SI/HI.  Feels fatigued because can't do anything except lay around.  She does not want to continue Seroquel but concerned that she is allergic to it.  She had the same itching reaction to Vicodin.  She wants to know if there is a test we can do to diagnose menopause.  Still has spotting twice a month.  First week of month and the last week of the month.   Persistent about wanting to be prescribed Xanax today that she can take when she feels these symptoms of moodiness and anger.    Hypothyroidism With no new symptoms.  Free T4 checked on Friday and was normal. TSH mildly elevated. Takes Synthroid 175 mcg at home and has been compliant with her medications.    Lymphedema, lumbar  spondylolysis Pain is unchanged. Takes Percocet 10-325 and Lyrica for back pain and lymphedema pain.  Transportation is an issue for her and so she gets her prescriptions for 3 months at a time.    ROS: See HPI for pertinent ROS.  Denies fevers, chills, shortness of breath, nausea, vomiting, diarrhea.  No chest pain or abdominal pain.   [place]: Pertinent past medical, surgical, family, and social history were reviewed and updated as appropriate. Smoking status reviewed.  Medications reviewed.  Objective:   BP (!) 138/102   Pulse 66   Temp 98.3 F (36.8 C) (Oral)   Ht 5' 8"  (1.727 m)   Wt 238 lb 12.8 oz (108.3 kg)   SpO2 97%   BMI 36.31 kg/m  Vitals and nursing note reviewed.  General: 51 year old F, appears anxious  HEENT: NCAT, EOMI, PERRL, MMM Neck: supple, non-tender without lymphadenopathy CV: RRR no MRG  Lungs: CTA B/L with normal work of breathing  Abdomen: soft, NT, ND, +bs Skin: warm, dry Extremities: chronic edema of bilateral LE Neuro: AAOx3, no focal deficits, 5/5 strength and sensation in upper and lower extremities bilaterally  Psych: speech is not pressured, anxious appearing   Assessment & Plan:   Menopause vs PMS/PMDD Unclear etiology given patient's symptoms revolving around  time of menstruation. Consider possible PMDD vs PMS.  Reports mood changes and anger which are new for her. She does not wish to continue Zoloft or Seroquel which was recently prescribed to her last week.  Complaining of itching and other somatic symptoms.  Does not want to be on too many medications.  Unsure if underlying psych component. At today's visit with PHQ-9 score of 15 and GAD score of 7. Persistently requesting Xanax today that she can take when she feels moody/angry. I explained to her that this will likely not solve anything for her in the long-term and that we should work together on a plan to control her symptoms.  Denies SI/HI at this time.  She was initially unwilling to speak  to [facility] however agreeable at the end of visit.  Discussed patient with Sonia Baller in [facility] and they met briefly.   -Patient set up to follow up with [facility] for further evaluation.   -Will see her back in clinic in 1-2 weeks   Additionally, patient requesting refills of her 3 month supply of Percocet for lymphedema and back pain. Precepted with Dr. Erin Hearing.  In setting of her possible episodic mood disorder, will not refill at this time.   No Xanax prescribed. Concern for refilling narcotics and prescribing benzodiazepines in setting of episodic mood disorder may result in misuse/overdose.  Her Lyrica was refilled.   Patient  given only 40 tabs of Percocet and must follow up with Behavioral Health in the meantime to be evaluated.   Patient was informed of this and she expressed understanding.    Orders Placed This Encounter  Procedures  . Tdap vaccine greater than or equal to 7yo IM   Meds ordered this encounter  Medications  . oxyCODONE-acetaminophen (PERCOCET) 10-325 MG tablet    Sig: Take 1 tablet by mouth every 8 (eight) hours as needed for pain.    Dispense:  40 tablet    Refill:  0  . albuterol (PROAIR HFA) 108 (90 Base) MCG/ACT inhaler    Sig: Inhale 2 puffs into the lungs every 4 (four) hours as needed for shortness of breath.    Dispense:  18 g    Refill:  5  . pregabalin (LYRICA) 150 MG capsule    Sig: Take 1 capsule (150 mg total) by mouth 2 (two) times daily.    Dispense:  180 capsule    Refill:  4    Lovenia Kim, MD [place], PGY-1 05/31/2016 1:00 AM

## 2016-05-25 NOTE — Telephone Encounter (Signed)
Discussed with patient at today's visit that she would need to have pharmacy send me the paperwork for 6 month approval.  Patient agreed to get that and will send it in once I receive it.  Refills given for both Percoset and Lyrica today.

## 2016-05-28 ENCOUNTER — Telehealth: Payer: Self-pay | Admitting: Family Medicine

## 2016-05-28 NOTE — Telephone Encounter (Signed)
Will forward to MD and nurse pool to make them aware that PA is coming. Jazmin Hartsell,CMA

## 2016-05-28 NOTE — Telephone Encounter (Signed)
Prior Authorization received from [facility] for Oxycodone=Acetaminophen 10-325 mg. Formulary and PA form placed in provider box for completion. Derl Barrow, RN

## 2016-05-28 NOTE — Telephone Encounter (Signed)
Pt is upset because because she has not been able to get her medication yet. Pt would like to get PA taken care of as soon as possible. ep

## 2016-05-28 NOTE — Telephone Encounter (Signed)
Pt called because she was prescribed Oxycodone on 05/25/16. The pharmacy is telling her that it needs to be approved.I told her to tell the pharmacy to fax the PA. She said that they did, I spoke with the pharmacy and they said that they have faxed [place] the PA. I asked them to send this again today. jw

## 2016-05-29 ENCOUNTER — Telehealth: Payer: Self-pay | Admitting: Internal Medicine

## 2016-05-29 NOTE — Telephone Encounter (Signed)
Dr. Reesa Chew completed PA form today. I faxed this form today.   Smiley Houseman, MD

## 2016-05-29 NOTE — Telephone Encounter (Signed)
**  After Hours/ Emergency Line Call*  Received a call to report that [name]  Reports that she has not been able to pick up her Percocet for her Lymphedema. Reports pharmacy has sent PA forms to clinic but has not heard back. She is in a lot of pain from her lymphedema and was wondering if PCP can complete it online. I reported that I will attempt to get in contact and see if this can be done but will be able to fax this form Monday morning.  Will forward to PCP.  Smiley Houseman, MD PGY-2, [place] [facility]

## 2016-05-30 ENCOUNTER — Other Ambulatory Visit: Payer: Self-pay | Admitting: Family Medicine

## 2016-05-30 DIAGNOSIS — K219 Gastro-esophageal reflux disease without esophagitis: Secondary | ICD-10-CM

## 2016-06-01 ENCOUNTER — Telehealth: Payer: Self-pay | Admitting: Family Medicine

## 2016-06-01 ENCOUNTER — Ambulatory Visit: Payer: Medicaid Other

## 2016-06-01 NOTE — Telephone Encounter (Signed)
Pt wanted Sonia Baller to know the Seroquel is working. She is on her menstruation and has not  had any mood swings. She is feeling really good. She doesn't feel she doesn't need anything stronger.  Please let pt know if she still needs to come to her appt today.

## 2016-06-01 NOTE — Telephone Encounter (Signed)
[facility] called patient to discuss today's appointment. Patient states she is feeling "really good" this week after continuing to take the Seroquel and has noticed a "big difference." I encouraged her to keep her appointment with me today despite this improvement, and explained that getting some diagnostic clarity may help her receive the most effective treatments moving forward. Patient expressed some concern about people here thinking that she has "something mental" going on. I reiterated that we are just trying to understand what is causing her symptoms and the history of her symptoms. Patient explained politely that she would rather just keep her appointment with Dr. Reesa Chew next week given the effectiveness of the Seroquel. She was happy to inform me that the itching has gone away and that she has started her exercise regimen (walking up the street) and was not out of breath. She wanted to thank Dr. Reesa Chew for helping her achieve this goal.

## 2016-06-03 NOTE — Telephone Encounter (Signed)
As Sonia Baller stated, would recommend for her to keep her appointments with [facility].  However if does not wish to that is fine as well.   Discussed with patient at prior visit that I cannot continue to prescribe narcotics or start a benzodiazepine if she has not followed up with [facility] and is demonstrating concerning symptoms.  Also discussed with Sonia Baller this afternoon and she will be available during my next visit with patient.

## 2016-06-08 ENCOUNTER — Encounter: Payer: Self-pay | Admitting: Family Medicine

## 2016-06-08 ENCOUNTER — Ambulatory Visit (INDEPENDENT_AMBULATORY_CARE_PROVIDER_SITE_OTHER): Payer: Medicaid Other | Admitting: Family Medicine

## 2016-06-08 VITALS — BP 138/76 | HR 64 | Temp 98.4°F | Ht 68.0 in | Wt 238.2 lb

## 2016-06-08 DIAGNOSIS — E669 Obesity, unspecified: Secondary | ICD-10-CM

## 2016-06-08 DIAGNOSIS — M5126 Other intervertebral disc displacement, lumbar region: Secondary | ICD-10-CM

## 2016-06-08 DIAGNOSIS — F39 Unspecified mood [affective] disorder: Secondary | ICD-10-CM

## 2016-06-08 DIAGNOSIS — I890 Lymphedema, not elsewhere classified: Secondary | ICD-10-CM

## 2016-06-08 DIAGNOSIS — K219 Gastro-esophageal reflux disease without esophagitis: Secondary | ICD-10-CM

## 2016-06-08 MED ORDER — OXYCODONE-ACETAMINOPHEN 10-325 MG PO TABS: 1.0000 | ORAL_TABLET | Freq: Three times a day (TID) | ORAL | 0 refills | Status: DC | PRN

## 2016-06-08 MED ORDER — PANTOPRAZOLE SODIUM 40 MG PO TBEC: 40.0000 mg | DELAYED_RELEASE_TABLET | Freq: Every day | ORAL | 2 refills | Status: DC

## 2016-06-08 NOTE — Patient Instructions (Signed)
It was great seeing you again today! I'm happy you are feeling so much better on Seroquel.  I encourage you to continue taking it as it has helped your symptoms.  Please follow back with our clinic in 1 month for medication refill.  Call clinic with questions.    Be well,  Lovenia Kim, MD

## 2016-06-08 NOTE — Progress Notes (Signed)
Dr. Reesa Chew requested a [place].   Presenting Issue:  Irritability, chronic pain  Issues Discussed: I sat in during patient's appointment with Dr. Reesa Chew to provide support during medication management discussion. Patient continues to notice that her mood is better when taking Seroquel and does not feel that she needs any other medications or treatment for mood at this time. Patient reported doing well on her pain medication over the last month. Dr. Reesa Chew discussed a plan for continuing to taper the Percocet. Patient expressed concerns about attending appointments every month (rather than every three months). We expressed understanding of these concerns and provided the rationale for why these visits are needed, since medically, patient does not feel she needs to see her doctor every month. Patient expressed understanding of this rationale and agreed to this plan. Patient is interested in improving her diet and asked for resources on buying healthy food at the grocery store. I gave her a shopping guide handout from [employer] and recommended that she visit the website [employer] to learn more about healthy eating. Patient stated excitement about looking at the website and trying a new diet.   Warmhandoff:    Warm Hand Off Completed.

## 2016-06-08 NOTE — Progress Notes (Signed)
Subjective:   Patient ID: [name]    DOB: 01-Mar-1966, 51 y.o. female   MRN: [id number]  CC:  Follow up mood    HPI: [name] is a 51 y.o. female who presents to clinic today to follow up on her mood issue.   Mood disorder Was started on Seroquel on 05/19/2016.  States she has been taking it and feels great.  Reports good compliance.   She has been walking outside.  Has not had outbursts, and has maintained a normal mood around time of menstruation.  Has not become irritable with family members.  Reports no side effects.  Her son has noticed her mood is better.    Back pain/lymphedema Was given 40 tabs Percocet to be taken TID prn at last visit.  Discussed goal of tapering her Percocet and patient initially upset but expressed good understanding.  Is afraid that she will be cut off entirely.  Discussed decreasing number of tabs at visits and having her come in monthly rather than providing prescriptions Q3 months.  Also requesting back exercises that she can do at home to strengthen.   ROS: See HPI for pertinent ROS.  Denies fevers, chills, abdominal pain, nausea, vomiting, diarrhea.    Smoking status reviewed. Medications reviewed.  Objective:   BP 138/76   Pulse 64   Temp 98.4 F (36.9 C) (Oral)   Ht 5\' 8"  (1.727 m)   Wt 238 lb 3.2 oz (108 kg)   LMP 06/04/2016   SpO2 99%   BMI 36.22 kg/m  Vitals and nursing note reviewed.  General: 51 yo F,  well nourished, well developed, in NAD HEENT: NCAT, MMM, o/p clear  Neck: supple, full ROM  CV: RRR no MRG  Lungs: CTA B/L with comfortable work of breathing  Abdomen: soft, non-tender, +bs  Skin: warm, dry  Extremities: warm and well perfused, normal tone Psych: appears overly excited   Assessment & Plan:   Lumbar disc herniation Reduced to 3 tabs Percocet daily at last visit 2 weeks ago.  Reports she has been managing with this and requests exercises for the back that she can do at home.  -Discussed goal of tapering  Percocet .  Patient agreeable to this and agreed to follow up in clinic monthly for refill and evaluation.   -Refilled 84 tabs for the month (3/day)  -Weight loss counseling -Provided patient with handout of back exercises and healthy foods as requested -Return precautions discussed   Episodic mood disorder ([place]) Patient started on Seroquel 05/19/2016 and notes she is feeling great.  Did not follow up with [place] in between our visits as requested.  Reports good compliance and has not noticed any side effects.  Less moody, family members have noticed a difference.  -She appears slightly manic this afternoon.  Is extremely happy she is feeling better and thanks [place] for "curing her."   -Sonia Baller [facility]) also present in room at time of visit.  Discussed continuing the Seroquel as it has been working for her.  Patient is happy that she does not have menopause and has been sleeping better -Will continue to monitor.   Blood pressure at visit elevated to 142/86. Patient is concerned that she needs to start a medication today.  Explained that blood pressures will need to be checked on separate visits in order to assess for hypertension.  Blood pressure rechecked prior to leaving: 138/76.   Will not start medication as previously normotensive and repeat BP improved.  Likely elevated 2/2 to her anxiety.     Orders Placed This Encounter  Procedures  . Ambulatory referral to Gastroenterology    Referral Priority:   Routine    Referral Type:   Consultation    Referral Reason:   Specialty Services Required    Referred to Provider:   Jerene Bears, MD    Number of Visits Requested:   1   Meds ordered this encounter  Medications  . pantoprazole (PROTONIX) 40 MG tablet    Sig: Take 1 tablet (40 mg total) by mouth daily.    Dispense:  30 tablet    Refill:  2  . oxyCODONE-acetaminophen (PERCOCET) 10-325 MG tablet    Sig: Take 1 tablet by mouth every 8 (eight) hours as needed for pain.    Dispense:  84  tablet    Refill:  0     Lovenia Kim, MD [place], PGY-1 06/10/2016 9:39 AM

## 2016-06-10 ENCOUNTER — Telehealth: Payer: Self-pay | Admitting: *Deleted

## 2016-06-10 NOTE — Assessment & Plan Note (Addendum)
Reduced to 3 tabs Percocet daily at last visit 2 weeks ago.  Reports she has been managing with this and requests exercises for the back that she can do at home.  -Discussed goal of tapering Percocet .  Patient agreeable to this and agreed to follow up in clinic monthly for refill and evaluation.   -Refilled 84 tabs for the month (3/day)  -Weight loss counseling -Provided patient with handout of back exercises and healthy foods as requested -Return precautions discussed

## 2016-06-10 NOTE — Assessment & Plan Note (Signed)
Patient started on Seroquel 05/19/2016 and notes she is feeling great.  Did not follow up with [place] in between our visits as requested.  Reports good compliance and has not noticed any side effects.  Less moody, family members have noticed a difference.  -She appears slightly manic this afternoon.  Is extremely happy she is feeling better and thanks [place] for "curing her."   -Sonia Baller [facility]) also present in room at time of visit.  Discussed continuing the Seroquel as it has been working for her.  Patient is happy that she does not have menopause and has been sleeping better -Will continue to monitor.

## 2016-06-10 NOTE — Telephone Encounter (Signed)
Patient called stating she needed a prior authorization for her pain medication oxycodone-acetaminophen.  PA was completed via [place] [employer] and approved until 12/06/2016.  Approval number: [id number].  Derl Barrow, RN

## 2016-06-18 ENCOUNTER — Ambulatory Visit: Payer: Medicaid Other | Admitting: Physician Assistant

## 2016-07-06 ENCOUNTER — Other Ambulatory Visit: Payer: Self-pay | Admitting: Family Medicine

## 2016-07-06 NOTE — Telephone Encounter (Signed)
Pt needs refills on clobetasol, seroque, requip  and oxycodone. [facility] on [address]. Pt only has 3 days left on oxy,. Please let her know when she can pick up the prescription for that.

## 2016-07-07 ENCOUNTER — Ambulatory Visit: Payer: Medicaid Other | Admitting: Physician Assistant

## 2016-07-08 ENCOUNTER — Ambulatory Visit (INDEPENDENT_AMBULATORY_CARE_PROVIDER_SITE_OTHER): Payer: Medicaid Other | Admitting: Student

## 2016-07-08 ENCOUNTER — Encounter: Payer: Self-pay | Admitting: Student

## 2016-07-08 DIAGNOSIS — L02214 Cutaneous abscess of groin: Secondary | ICD-10-CM

## 2016-07-08 MED ORDER — CLINDAMYCIN HCL 300 MG PO CAPS: 300.0000 mg | ORAL_CAPSULE | Freq: Four times a day (QID) | ORAL | 0 refills | Status: DC

## 2016-07-08 MED ORDER — TRAMADOL HCL 50 MG PO TABS: 50.0000 mg | ORAL_TABLET | Freq: Three times a day (TID) | ORAL | 0 refills | Status: DC | PRN

## 2016-07-08 NOTE — Patient Instructions (Signed)
Follow up with PCP for cellulitis in 2 weeks Take antibiotics as prescribed If you feels your skin rash is getting worse, return to the office sooner

## 2016-07-08 NOTE — Progress Notes (Signed)
   Subjective:    Patient ID: [name], female    DOB: 13-Jul-1965, 51 y.o.   MRN: [id number]   CC: boil on thigh  HPI: 51 y/o F presents for boil of upper right thigh  Boil - noted 5 days ago - she feels the skin around it has been getting red - last night it started to drain - to feels she has had fever to 101 and has been taking tylenol for it, last took it approximately 5 hours prior to this visit - she has had boils like this is past at the same site  Smoking status reviewed  Review of Systems  Per HPI, else denies chest pain, shortness of breath,    Objective:  BP 130/80   Pulse 73   Temp 98.1 F (36.7 C) (Oral)   Wt 240 lb (108.9 kg)   SpO2 98%   BMI 36.49 kg/m  Vitals and nursing note reviewed  General: NAD Cardiac: RRR,  Respiratory: CTAB, normal effort Skin: approximately 1.5 cm diameter swelling in left superior inguinal fold draining serous like fluid, no palpable fluctuant pockets, approximately 3 cm diameter surrounding area of induration Neuro: alert and oriented, no focal deficits   Assessment & Plan:    Inguinal abscess Inguinal abscess which has been reccurent in nature, no palpable pockets to drain as it is already draining, Given history of fever, despite being afebrile today, and surrounding induration on exam, will start clindamycin x14 days - follow with PCP - tramadol for pain     Alyssa A. Lincoln Brigham MD, [facility] Resident PGY-3 Pager [phone]

## 2016-07-08 NOTE — Assessment & Plan Note (Signed)
Inguinal abscess which has been reccurent in nature, no palpable pockets to drain as it is already draining, Given history of fever, despite being afebrile today, and surrounding induration on exam, will start clindamycin x14 days - follow with PCP - tramadol for pain

## 2016-07-12 ENCOUNTER — Ambulatory Visit: Payer: Medicaid Other | Admitting: Obstetrics and Gynecology

## 2016-07-12 ENCOUNTER — Other Ambulatory Visit: Payer: Self-pay | Admitting: Family Medicine

## 2016-07-12 ENCOUNTER — Telehealth: Payer: Self-pay | Admitting: Internal Medicine

## 2016-07-12 MED ORDER — QUETIAPINE FUMARATE 50 MG PO TABS: 50.0000 mg | ORAL_TABLET | Freq: Every day | ORAL | 3 refills | Status: DC

## 2016-07-12 MED ORDER — CLOBETASOL PROPIONATE 0.05 % EX CREA: TOPICAL_CREAM | Freq: Two times a day (BID) | CUTANEOUS | 6 refills | Status: DC

## 2016-07-12 MED ORDER — ROPINIROLE HCL 0.25 MG PO TABS: 0.2500 mg | ORAL_TABLET | Freq: Once | ORAL | 2 refills | Status: DC

## 2016-07-12 NOTE — Telephone Encounter (Signed)
Entered in error

## 2016-07-12 NOTE — Telephone Encounter (Signed)
Pt needs refill on percocet.  She ran out of it on may 5. She has an appt today at 11 in the clinic and wants to pick it up then.

## 2016-07-12 NOTE — Telephone Encounter (Signed)
[facility] After Hours Telephone Line   Number received via page: [phone] Person calling: Chaniece Barbato Reason for call: Patient call stating for the past 3 months she had had throat swelling. She denies any physical swelling, or issues with breathing. However indicates her esophagus will sometimes close up, and she can only exhale but not inhale at that point.She states her has had reflux in the past couple of weeks that has been ongoing contributor. She indicates the area of "swelling" is were she use to have her thyroid. She is suppose to see GI and wants to know if she can get her appointment moved up. Again patient denied SOB, trouble breathing/ nausea or vomiting concerning for acute allergic reaction.  Provide patient with a same day appointment.  SDA appointment  5/7  @11 :30 with Dr. Fontaine No PGY-2 [facility]

## 2016-07-12 NOTE — Telephone Encounter (Signed)
Who did she have an appointment with today?  Please ask her to schedule an appt with me as we had agreed upon monthly visits and working on slowly decreasing the amount of Percocet she takes. Thanks!

## 2016-07-12 NOTE — Telephone Encounter (Signed)
Please inform patient she will need to schedule an office visit for additional Percocet refill.  Per our discussion on office visit from 4/3, we had talked about reducing the amount she is taking.  I will refill her Seroquel, Requip, and Clobetasol in the meantime.

## 2016-07-13 NOTE — Telephone Encounter (Signed)
Pt called again this morning about her pain meds. She says she was in the office last week and saw dr Lincoln Brigham.  She would like to pick up the RX today.  Please advise

## 2016-07-13 NOTE — Telephone Encounter (Signed)
Contacted pt and informed her she would need a FU visit with pcp to discuss pain meds. Pt has an apt for 5/25 at 230 with pcp, pcp does not have anything until then. Pt is wondering if she can have enough pills to last her until then? Please advise.

## 2016-07-14 ENCOUNTER — Other Ambulatory Visit: Payer: Self-pay | Admitting: Family Medicine

## 2016-07-14 DIAGNOSIS — I890 Lymphedema, not elsewhere classified: Secondary | ICD-10-CM

## 2016-07-14 MED ORDER — OXYCODONE-ACETAMINOPHEN 10-325 MG PO TABS
1.0000 | ORAL_TABLET | Freq: Three times a day (TID) | ORAL | 0 refills | Status: DC | PRN
Start: 2016-07-14 — End: 2016-07-30

## 2016-07-14 MED ORDER — OXYCODONE-ACETAMINOPHEN 10-325 MG PO TABS: 1.0000 | ORAL_TABLET | Freq: Three times a day (TID) | ORAL | 0 refills | Status: DC | PRN

## 2016-07-14 NOTE — Telephone Encounter (Signed)
Will leave an RX at the front office for her for 50 tablets for her to pick up.  Please let her know that this is a one time thing and she will have to follow up with me from now on for additional refills.

## 2016-07-16 ENCOUNTER — Ambulatory Visit: Payer: Medicaid Other | Admitting: Gastroenterology

## 2016-07-16 NOTE — Telephone Encounter (Signed)
Patient informed, rx picked up.

## 2016-07-27 ENCOUNTER — Encounter: Payer: Self-pay | Admitting: Nurse Practitioner

## 2016-07-27 ENCOUNTER — Ambulatory Visit (INDEPENDENT_AMBULATORY_CARE_PROVIDER_SITE_OTHER): Payer: Medicaid Other | Admitting: Nurse Practitioner

## 2016-07-27 VITALS — BP 114/80 | HR 72 | Ht 67.5 in | Wt 242.4 lb

## 2016-07-27 DIAGNOSIS — R05 Cough: Secondary | ICD-10-CM

## 2016-07-27 DIAGNOSIS — R1310 Dysphagia, unspecified: Secondary | ICD-10-CM

## 2016-07-27 DIAGNOSIS — R059 Cough, unspecified: Secondary | ICD-10-CM

## 2016-07-27 DIAGNOSIS — K219 Gastro-esophageal reflux disease without esophagitis: Secondary | ICD-10-CM

## 2016-07-27 MED ORDER — OMEPRAZOLE 40 MG PO CPDR: 40.0000 mg | DELAYED_RELEASE_CAPSULE | Freq: Two times a day (BID) | ORAL | 3 refills | Status: DC

## 2016-07-27 NOTE — Progress Notes (Addendum)
     HPI: Patient is a 51 year old female known to Dr. Hilarie Fredrickson. She has a history of serrated adenomatous colon polyps, GERD, and chronic epigastric pain.  [name] has been on Protonix for years. Approximately 5 months ago PPI was increased to twice a day for breakthrough symptoms. She comes in with persistent regurgitation and heartburn but her main complaint is that of periodic choking sensation. About twice a week patient suddenly feels like she cannot swallow saliva (or food if eating) nor catch her breath. Patient has COPD, she used her inhaler during an episode but it only made things worse. She has been reading about laryngospasm and feels her symptoms are compatible with this. She complains of halitosis stating that breath smells like feces. In addition to above, she does have intermittent solid food dysphagia. She is status post remote thyroidectomy   Past Medical History:  Diagnosis Date  . Anxiety   . Arthritis    knees,hands, hip  . Cellulitis   . Colon polyps 2013   SESSIL SERRATED ADENOMA (ALL FRAGMENTS)  . COPD (chronic obstructive pulmonary disease) ([place])   . Depression   . Fibroadenosis breast   . GERD (gastroesophageal reflux disease)    does not take med  . Heart murmur    as child  . Hyperlipidemia    no meds - diet controlled  . Hypothyroidism   . Lymph edema    both legs and feet  . Migraine   . Plantar fasciitis   . Syphilis Treated in early 2012 during hospitalization  . Varicose veins     Patient's surgical history, family medical history, social history, medications and allergies were all reviewed in Epic    Physical Exam: BP 114/80 (BP Location: Left Arm, Patient Position: Sitting, Cuff Size: Normal)   Pulse 72   Ht 5' 7.5" (1.715 m)   Wt 242 lb 6 oz (109.9 kg)   BMI 37.40 kg/m   GENERAL: well developed female in NAD PSYCH: :Pleasant, cooperative, normal affect EENT:  conjunctiva pink, mucous membranes moist, neck supple without masses CARDIAC:   RRR, no murmur heard, no peripheral edema PULM: Normal respiratory effort, lungs CTA bilaterally, no wheezing ABDOMEN:  soft, nontender, nondistended, no obvious masses, no hepatomegaly,  normal bowel sounds SKIN:  turgor, no lesions seen Musculoskeletal:  Normal muscle tone, normal strength NEURO: Alert and oriented x 3, no focal neurologic deficits    ASSESSMENT and PLAN:   51 year old female with breakthrough GERD symptoms on BID PPI / periodic choking sensation when swallowing saliva, feels like throat closing up. Also having intermittent dysphagia to solids.  -She has been on protonix for years. Will discontinue and try Prilosec -GERD literature.  -barium swallow with tablet for evaluation of dysphagia and sensation that throat is closing -will see her back in two weeks.     Tye Savoy , NP 07/27/2016, 11:15 AM    Addendum: Reviewed and agree with initial management. Barium esophagram reveals probable esophageal stricture. [name] has appropriately arranged upper endoscopy for probable dilatation Pyrtle, Lajuan Lines, MD

## 2016-07-27 NOTE — Patient Instructions (Addendum)
If you are age 51 or older, your body mass index should be between 23-30. Your Body mass index is 37.4 kg/m. If this is out of the aforementioned range listed, please consider follow up with your Primary Care Provider.  If you are age 62 or younger, your body mass index should be between 19-25. Your Body mass index is 37.4 kg/m. If this is out of the aformentioned range listed, please consider follow up with your Primary Care Provider.   You have been scheduled for a Barium Esophogram at [facility]  on 07/30/16 at 1030 am. Please arrive 15 minutes prior to your appointment for registration. Make certain not to have anything to eat or drink 3hours prior to your test. If you need to reschedule for any reason, please contact radiology at [phone] to do so. __________________________________________________________________ A barium swallow is an examination that concentrates on views of the esophagus. This tends to be a double contrast exam (barium and two liquids which, when combined, create a gas to distend the wall of the oesophagus) or single contrast (non-ionic iodine based). The study is usually tailored to your symptoms so a good history is essential. Attention is paid during the study to the form, structure and configuration of the esophagus, looking for functional disorders (such as aspiration, dysphagia, achalasia, motility and reflux) EXAMINATION You may be asked to change into a gown, depending on the type of swallow being performed. A radiologist and radiographer will perform the procedure. The radiologist will advise you of the type of contrast selected for your procedure and direct you during the exam. You will be asked to stand, sit or lie in several different positions and to hold a small amount of fluid in your mouth before being asked to swallow while the imaging is performed .In some instances you may be asked to swallow barium coated marshmallows to assess the motility of a solid food  bolus. The exam can be recorded as a digital or video fluoroscopy procedure. POST PROCEDURE It will take 1-2 days for the barium to pass through your system. To facilitate this, it is important, unless otherwise directed, to increase your fluids for the next 24-48hrs and to resume your normal diet.  This test typically takes about 30 minutes to perform. __________________________________________________________________________________ We have sent the following medications to your pharmacy for you to pick up at your convenience: Omeprazole 40mg  twice daily before meals.  Discontinue Protonix.  You have been given GERD literature.  Thank you for choosing me and [facility].  Follow up appointment with Tye Savoy, NP on August 10, 2016 at 1100 am.  Pricilla Riffle. Dagoberto Ligas., MD., [facility]

## 2016-07-30 ENCOUNTER — Ambulatory Visit (HOSPITAL_COMMUNITY)
Admission: RE | Admit: 2016-07-30 | Discharge: 2016-07-30 | Disposition: A | Discharge status: Home / Self Care | Payer: Medicaid Other | Source: Ambulatory Visit | Attending: Nurse Practitioner | Admitting: Nurse Practitioner

## 2016-07-30 ENCOUNTER — Encounter: Payer: Self-pay | Admitting: Family Medicine

## 2016-07-30 ENCOUNTER — Ambulatory Visit (INDEPENDENT_AMBULATORY_CARE_PROVIDER_SITE_OTHER): Payer: Medicaid Other | Admitting: Family Medicine

## 2016-07-30 VITALS — BP 136/80 | HR 65 | Temp 98.3°F | Wt 240.0 lb

## 2016-07-30 DIAGNOSIS — R109 Unspecified abdominal pain: Secondary | ICD-10-CM

## 2016-07-30 DIAGNOSIS — I890 Lymphedema, not elsewhere classified: Secondary | ICD-10-CM

## 2016-07-30 DIAGNOSIS — L02214 Cutaneous abscess of groin: Secondary | ICD-10-CM | POA: Diagnosis not present

## 2016-07-30 DIAGNOSIS — E785 Hyperlipidemia, unspecified: Secondary | ICD-10-CM | POA: Diagnosis not present

## 2016-07-30 DIAGNOSIS — K219 Gastro-esophageal reflux disease without esophagitis: Secondary | ICD-10-CM | POA: Diagnosis not present

## 2016-07-30 DIAGNOSIS — M5126 Other intervertebral disc displacement, lumbar region: Secondary | ICD-10-CM | POA: Diagnosis not present

## 2016-07-30 DIAGNOSIS — E039 Hypothyroidism, unspecified: Secondary | ICD-10-CM | POA: Diagnosis not present

## 2016-07-30 DIAGNOSIS — R05 Cough: Secondary | ICD-10-CM | POA: Diagnosis present

## 2016-07-30 DIAGNOSIS — R1310 Dysphagia, unspecified: Secondary | ICD-10-CM | POA: Diagnosis present

## 2016-07-30 DIAGNOSIS — G8929 Other chronic pain: Secondary | ICD-10-CM

## 2016-07-30 DIAGNOSIS — R059 Cough, unspecified: Secondary | ICD-10-CM

## 2016-07-30 MED ORDER — ONDANSETRON HCL 4 MG PO TABS: 4.0000 mg | ORAL_TABLET | Freq: Three times a day (TID) | ORAL | 1 refills | Status: DC | PRN

## 2016-07-30 MED ORDER — OXYCODONE-ACETAMINOPHEN 10-325 MG PO TABS: 1.0000 | ORAL_TABLET | Freq: Three times a day (TID) | ORAL | 0 refills | Status: DC | PRN

## 2016-07-30 NOTE — Patient Instructions (Signed)
It was nice seeing you again!  You were seen in clinic for follow up of your boil and for medication refills.  I would advise you to continue the Clindamycin and finish the course of antibiotics as I feel this is improving.    Additionally, your Percocet was refilled for chronic pain and you had some labs drawn.   I will follow up with the results of your bloodwork and call in your cholesterol medication to the pharmacy as we had discussed.   Please call clinic with any questions.   Lovenia Kim, MD

## 2016-07-30 NOTE — Progress Notes (Signed)
   Subjective:   Patient ID: [name]    DOB: [birth year], 51 y.o. female   MRN: [id number]  CC: medication refills, follow up inguinal abscess   HPI: [name] is a 51 y.o. female who presents to clinic today for medication refills and for following up on her inguinal abscess.    Was recently seen in clinic on 5/3 and prescribed a course of Clindamycin for 14 days.  Patient notes she has remained afebrile in the interim.  Has a few more capsules left.  Boil appears to have improved in size however still painful.  Has not experienced any side effects of the antibiotic. She has had recurrent boils like this in the past near the same site.  No other concerns at this time.    ROS: Denies fevers, chills, nausea, vomiting, diarrhea.  +Pain in inguinal region.   [place]: Pertinent past medical, surgical, family, and social history were reviewed and updated as appropriate. Smoking status reviewed.  Medications reviewed.  Objective:   BP 136/80   Pulse 65   Temp 98.3 F (36.8 C) (Oral)   Wt 240 lb (108.9 kg)   LMP 07/25/2016   SpO2 98%   BMI 37.03 kg/m  Vitals and nursing note reviewed.  General: 51 yo F, NAD CV: RRR no MRG Lungs: CTAB, normal effort  Abdomen: soft, NTND, +BS Skin: warm, dry, 1 cm swelling in superior inguinal fold with no noted drainage and no surrounding areas of erythema  Assessment & Plan:   Inguinal abscess With history of recurrent boils in this area.  Patient started on course of Clindamycin and has noted an improvement on this.  No red flags on exam.  Patient has remained afebrile and without other systemic symptoms.  -Advised to finish the course of antibiotics -Recommended warm compresses to the area for 30 min at a time may relieve some of the pain -Tylenol for fever -Return precautions discussed   Lumbar disc herniation Patient notes she feels much better once cutting down to 3 tabs/day.  She continues to have back pain however has been  working on weight loss to help with her symptoms as discussed.  -Refilled Percocet x84 tabs for this month -Will continue to monitor   Hypothyroidism Stable.  Continue current synthroid dose and check TSH today.   Orders Placed This Encounter  Procedures  . Hemoglobin A1c  . Lipid panel  . Basic metabolic panel   Meds ordered this encounter  Medications  . ondansetron (ZOFRAN) 4 MG tablet    Sig: Take 1 tablet (4 mg total) by mouth every 8 (eight) hours as needed for nausea or vomiting.    Dispense:  30 tablet    Refill:  1  . oxyCODONE-acetaminophen (PERCOCET) 10-325 MG tablet    Sig: Take 1 tablet by mouth every 8 (eight) hours as needed for pain.    Dispense:  84 tablet    Refill:  0   Follow up: 1 mo  Lovenia Kim, MD [place], PGY-1 08/03/2016 10:58 PM

## 2016-07-31 LAB — LIPID PANEL
CHOL/HDL RATIO: 2.9 ratio (ref 0.0–4.4)
Cholesterol, Total: 248 mg/dL — ABNORMAL HIGH (ref 100–199)
HDL: 86 mg/dL (ref >39)
LDL Calculated: 143 mg/dL — ABNORMAL HIGH (ref 0–99)
TRIGLYCERIDES: 96 mg/dL (ref 0–149)
VLDL Cholesterol Cal: 19 mg/dL (ref 5–40)

## 2016-07-31 LAB — BASIC METABOLIC PANEL
BUN/Creatinine Ratio: 7 — ABNORMAL LOW (ref 9–23)
BUN: 6 mg/dL (ref 6–24)
CALCIUM: 9.3 mg/dL (ref 8.7–10.2)
CHLORIDE: 103 mmol/L (ref 96–106)
CO2: 24 mmol/L (ref 18–29)
CREATININE: 0.85 mg/dL (ref 0.57–1.00)
GFR calc non Af Amer: 80 mL/min/{1.73_m2} (ref >59)
GFR, EST AFRICAN AMERICAN: 92 mL/min/{1.73_m2} (ref >59)
Glucose: 93 mg/dL (ref 65–99)
Potassium: 4.3 mmol/L (ref 3.5–5.2)
Sodium: 140 mmol/L (ref 134–144)

## 2016-07-31 LAB — HEMOGLOBIN A1C
ESTIMATED AVERAGE GLUCOSE: 103 mg/dL
HEMOGLOBIN A1C: 5.2 % (ref 4.8–5.6)

## 2016-08-03 NOTE — Assessment & Plan Note (Addendum)
Stable.  Continue current synthroid dose and check TSH today.

## 2016-08-03 NOTE — Assessment & Plan Note (Addendum)
With history of recurrent boils in this area.  Patient started on course of Clindamycin and has noted an improvement on this.  No red flags on exam.  Patient has remained afebrile and without other systemic symptoms.  -Advised to finish the course of antibiotics -Recommended warm compresses to the area for 30 min at a time may relieve some of the pain -Tylenol for fever -Return precautions discussed

## 2016-08-03 NOTE — Assessment & Plan Note (Signed)
Patient notes she feels much better once cutting down to 3 tabs/day.  She continues to have back pain however has been working on weight loss to help with her symptoms as discussed.  -Refilled Percocet x84 tabs for this month -Will continue to monitor

## 2016-08-04 ENCOUNTER — Ambulatory Visit (AMBULATORY_SURGERY_CENTER): Payer: Self-pay

## 2016-08-04 VITALS — Ht 68.0 in | Wt 240.0 lb

## 2016-08-04 DIAGNOSIS — R1310 Dysphagia, unspecified: Secondary | ICD-10-CM

## 2016-08-04 NOTE — Progress Notes (Signed)
Denies allergies to eggs or soy products. Denies complication of anesthesia or sedation. Denies use of weight loss medication. Denies use of O2.   Emmi instructions given for colonoscopy.  

## 2016-08-09 ENCOUNTER — Encounter: Payer: Self-pay | Admitting: Internal Medicine

## 2016-08-09 ENCOUNTER — Ambulatory Visit (AMBULATORY_SURGERY_CENTER): Payer: Medicaid Other | Admitting: Internal Medicine

## 2016-08-09 VITALS — BP 133/88 | HR 58 | Temp 99.3°F | Resp 17 | Ht 67.5 in | Wt 240.0 lb

## 2016-08-09 DIAGNOSIS — R1011 Right upper quadrant pain: Secondary | ICD-10-CM | POA: Diagnosis not present

## 2016-08-09 DIAGNOSIS — R1310 Dysphagia, unspecified: Secondary | ICD-10-CM | POA: Diagnosis not present

## 2016-08-09 DIAGNOSIS — R933 Abnormal findings on diagnostic imaging of other parts of digestive tract: Secondary | ICD-10-CM | POA: Diagnosis not present

## 2016-08-09 DIAGNOSIS — R1319 Other dysphagia: Secondary | ICD-10-CM

## 2016-08-09 DIAGNOSIS — K2950 Unspecified chronic gastritis without bleeding: Secondary | ICD-10-CM | POA: Diagnosis not present

## 2016-08-09 DIAGNOSIS — R1013 Epigastric pain: Secondary | ICD-10-CM | POA: Diagnosis not present

## 2016-08-09 MED ORDER — SODIUM CHLORIDE 0.9 % IV SOLN: 500.0000 mL | INTRAVENOUS | Status: DC

## 2016-08-09 NOTE — Progress Notes (Signed)
Report to PACU, RN, vss, BBS= Clear.  

## 2016-08-09 NOTE — Op Note (Signed)
[facility] Patient Name: [name] Procedure Date: 08/09/2016 11:12 AM MRN: [id number] Endoscopist: Jerene Bears , MD Age: 51 Referring MD:  Date of Birth: 1965-10-14 Gender: Female Account #: [id number] Procedure:                Upper GI endoscopy Indications:              Epigastric abdominal pain, Abdominal pain in the                            right upper quadrant, Dysphagia, Abnormal                            cine-esophagram showing GE junction smooth                            narrowing, history of chronic right-sided abdominal                            pain, bloating Medicines:                Monitored Anesthesia Care Procedure:                Pre-Anesthesia Assessment:                           - Prior to the procedure, a History and Physical                            was performed, and patient medications and                            allergies were reviewed. The patient's tolerance of                            previous anesthesia was also reviewed. The risks                            and benefits of the procedure and the sedation                            options and risks were discussed with the patient.                            All questions were answered, and informed consent                            was obtained. Prior Anticoagulants: The patient has                            taken no previous anticoagulant or antiplatelet                            agents. ASA Grade Assessment: III - A patient with  severe systemic disease. After reviewing the risks                            and benefits, the patient was deemed in                            satisfactory condition to undergo the procedure.                           After obtaining informed consent, the endoscope was                            passed under direct vision. Throughout the                            procedure, the patient's blood pressure, pulse, and                        oxygen saturations were monitored continuously. The                            Endoscope was introduced through the mouth, and                            advanced to the second part of duodenum. The upper                            GI endoscopy was accomplished without difficulty.                            The patient tolerated the procedure well. Scope In: Scope Out: Findings:                 The examined esophagus was normal. No GE junction                            stricture was seen despite barium swallow results.                            Given barium tablet results and symptoms, a TTS                            dilator was passed through the scope. Dilation with                            a 16-17-18 mm balloon dilator was performed to 17                            mm x 1 minute.                           The entire examined stomach was normal. Biopsies                            were  taken with a cold forceps for histology and                            Helicobacter pylori testing (gastric body, antrum,                            and incisura).                           The examined duodenum was normal. Complications:            No immediate complications. Estimated Blood Loss:     Estimated blood loss was minimal. Impression:               - Normal esophagus. Distal esophagus across GE                            junction dilated to 17 mm.                           - Normal stomach. Biopsied.                           - Normal examined duodenum. Recommendation:           - Patient has a contact number available for                            emergencies. The signs and symptoms of potential                            delayed complications were discussed with the                            patient. Return to normal activities tomorrow.                            Written discharge instructions were provided to the                            patient.                            - Resume previous diet.                           - Continue present medications.                           - Await pathology results.                           - Perform a RUQ ultrasound at appointment to be                            scheduled to evaluate gallbladder given RUQ pain. Jerene Bears, MD 08/09/2016 11:41:27 AM This report has been signed electronically.

## 2016-08-09 NOTE — Patient Instructions (Signed)
YOU HAD AN ENDOSCOPIC PROCEDURE TODAY AT [facility]:   Refer to the procedure report that was given to you for any specific questions about what was found during the examination.  If the procedure report does not answer your questions, please call your gastroenterologist to clarify.  If you requested that your care partner not be given the details of your procedure findings, then the procedure report has been included in a sealed envelope for you to review at your convenience later.  YOU SHOULD EXPECT: Some feelings of bloating in the abdomen. Passage of more gas than usual.  Walking can help get rid of the air that was put into your GI tract during the procedure and reduce the bloating. If you had a lower endoscopy (such as a colonoscopy or flexible sigmoidoscopy) you may notice spotting of blood in your stool or on the toilet paper. If you underwent a bowel prep for your procedure, you may not have a normal bowel movement for a few days.  Please Note:  You might notice some irritation and congestion in your nose or some drainage.  This is from the oxygen used during your procedure.  There is no need for concern and it should clear up in a day or so.  SYMPTOMS TO REPORT IMMEDIATELY:     Following upper endoscopy (EGD)  Vomiting of blood or coffee ground material  New chest pain or pain under the shoulder blades  Painful or persistently difficult swallowing  New shortness of breath  Fever of 100F or higher  Black, tarry-looking stools  For urgent or emergent issues, a gastroenterologist can be reached at any hour by calling [phone].  Follow Dilatation diet given to you today.Drink plenty of fluids but you should avoid alcoholic beverages for 24 hours.  ACTIVITY:  You should plan to take it easy for the rest of today and you should NOT DRIVE or use heavy machinery until tomorrow (because of the sedation medicines used during the test).    FOLLOW UP: Our staff will  call the number listed on your records the next business day following your procedure to check on you and address any questions or concerns that you may have regarding the information given to you following your procedure. If we do not reach you, we will leave a message.  However, if you are feeling well and you are not experiencing any problems, there is no need to return our call.  We will assume that you have returned to your regular daily activities without incident.  If any biopsies were taken you will be contacted by phone or by letter within the next 1-3 weeks.  Please call us at [phone] if you have not heard about the biopsies in 3 weeks.    SIGNATURES/CONFIDENTIALITY: You and/or your care partner have signed paperwork which will be entered into your electronic medical record.  These signatures attest to the fact that that the information above on your After Visit Summary has been reviewed and is understood.  Full responsibility of the confidentiality of this discharge information lies with you and/or your care-partner.  Follow dilatation diet today  Ultrasound will be scheduled by Dr. Vena Rua office and they will call you with details  Await biopsy results

## 2016-08-09 NOTE — Progress Notes (Signed)
Called to room to assist during endoscopic procedure.  Patient ID and intended procedure confirmed with present staff. Received instructions for my participation in the procedure from the performing physician.  

## 2016-08-10 ENCOUNTER — Telehealth: Payer: Self-pay

## 2016-08-10 ENCOUNTER — Other Ambulatory Visit: Payer: Self-pay

## 2016-08-10 ENCOUNTER — Other Ambulatory Visit: Payer: Self-pay | Admitting: Family Medicine

## 2016-08-10 ENCOUNTER — Ambulatory Visit: Payer: Medicaid Other | Admitting: Nurse Practitioner

## 2016-08-10 DIAGNOSIS — R1011 Right upper quadrant pain: Secondary | ICD-10-CM

## 2016-08-10 NOTE — Telephone Encounter (Signed)
  Follow up Call-  Call back number 08/09/2016 06/12/2014  Post procedure Call Back phone  # [phone]  Permission to leave phone message Yes Yes  Some recent data might be hidden     Patient questions:  Do you have a fever, pain , or abdominal swelling? No. Pain Score  0 *  Have you tolerated food without any problems? Yes.    Have you been able to return to your normal activities? Yes.    Do you have any questions about your discharge instructions: Diet   No. Medications  No. Follow up visit  No.  Do you have questions or concerns about your Care? No.  Actions: * If pain score is 4 or above: No action needed, pain <4.

## 2016-08-10 NOTE — Telephone Encounter (Signed)
Pt scheduled for RUQ [place] at [facility] 08/19/16@11 :30am, pt to arrive there at 11:15am. Pt aware of appt.

## 2016-08-13 ENCOUNTER — Ambulatory Visit (HOSPITAL_COMMUNITY): Payer: Medicaid Other

## 2016-08-17 ENCOUNTER — Encounter: Payer: Self-pay | Admitting: Internal Medicine

## 2016-08-19 ENCOUNTER — Ambulatory Visit (HOSPITAL_COMMUNITY)
Admission: RE | Admit: 2016-08-19 | Discharge: 2016-08-19 | Disposition: A | Discharge status: Home / Self Care | Payer: Medicaid Other | Source: Ambulatory Visit | Attending: Internal Medicine | Admitting: Internal Medicine

## 2016-08-19 DIAGNOSIS — R1011 Right upper quadrant pain: Secondary | ICD-10-CM | POA: Insufficient documentation

## 2016-08-23 ENCOUNTER — Encounter: Payer: Self-pay | Admitting: Internal Medicine

## 2016-08-23 ENCOUNTER — Telehealth: Payer: Self-pay | Admitting: *Deleted

## 2016-08-23 ENCOUNTER — Ambulatory Visit (INDEPENDENT_AMBULATORY_CARE_PROVIDER_SITE_OTHER): Payer: Medicaid Other | Admitting: Internal Medicine

## 2016-08-23 ENCOUNTER — Other Ambulatory Visit (INDEPENDENT_AMBULATORY_CARE_PROVIDER_SITE_OTHER): Payer: Medicaid Other

## 2016-08-23 VITALS — BP 120/88 | HR 67 | Ht 68.0 in | Wt 236.0 lb

## 2016-08-23 DIAGNOSIS — R112 Nausea with vomiting, unspecified: Secondary | ICD-10-CM

## 2016-08-23 DIAGNOSIS — R1011 Right upper quadrant pain: Secondary | ICD-10-CM

## 2016-08-23 DIAGNOSIS — R21 Rash and other nonspecific skin eruption: Secondary | ICD-10-CM | POA: Diagnosis not present

## 2016-08-23 LAB — CBC WITH DIFFERENTIAL/PLATELET
Basophils Absolute: 0.1 10*3/uL (ref 0.0–0.1)
Basophils Relative: 1.3 % (ref 0.0–3.0)
EOS ABS: 0.1 10*3/uL (ref 0.0–0.7)
EOS PCT: 1.8 % (ref 0.0–5.0)
HCT: 41.9 % (ref 36.0–46.0)
HEMOGLOBIN: 14.4 g/dL (ref 12.0–15.0)
LYMPHS PCT: 34 % (ref 12.0–46.0)
Lymphs Abs: 1.4 10*3/uL (ref 0.7–4.0)
MCHC: 34.3 g/dL (ref 30.0–36.0)
MCV: 99.9 fl (ref 78.0–100.0)
MONO ABS: 0.4 10*3/uL (ref 0.1–1.0)
Monocytes Relative: 10.4 % (ref 3.0–12.0)
Neutro Abs: 2.2 10*3/uL (ref 1.4–7.7)
Neutrophils Relative %: 52.5 % (ref 43.0–77.0)
Platelets: 291 10*3/uL (ref 150.0–400.0)
RBC: 4.2 Mil/uL (ref 3.87–5.11)
RDW: 13.2 % (ref 11.5–15.5)
WBC: 4.2 10*3/uL (ref 4.0–10.5)

## 2016-08-23 LAB — COMPREHENSIVE METABOLIC PANEL
ALBUMIN: 4.1 g/dL (ref 3.5–5.2)
ALK PHOS: 73 U/L (ref 39–117)
ALT: 17 U/L (ref 0–35)
AST: 22 U/L (ref 0–37)
BILIRUBIN TOTAL: 0.6 mg/dL (ref 0.2–1.2)
BUN: 12 mg/dL (ref 6–23)
CO2: 26 mEq/L (ref 19–32)
CREATININE: 0.9 mg/dL (ref 0.40–1.20)
Calcium: 9.3 mg/dL (ref 8.4–10.5)
Chloride: 104 mEq/L (ref 96–112)
GFR: 84.82 mL/min (ref >60.00)
Glucose, Bld: 92 mg/dL (ref 70–99)
Potassium: 4.1 mEq/L (ref 3.5–5.1)
SODIUM: 136 meq/L (ref 135–145)
TOTAL PROTEIN: 6.9 g/dL (ref 6.0–8.3)

## 2016-08-23 LAB — AMYLASE: Amylase: 49 U/L (ref 27–131)

## 2016-08-23 LAB — HIGH SENSITIVITY CRP: CRP, High Sensitivity: 2.07 mg/L (ref 0.000–5.000)

## 2016-08-23 LAB — LIPASE: Lipase: 7 U/L — ABNORMAL LOW (ref 11.0–59.0)

## 2016-08-23 MED ORDER — ONDANSETRON 8 MG PO TBDP: 8.0000 mg | ORAL_TABLET | Freq: Three times a day (TID) | ORAL | 0 refills | Status: DC | PRN

## 2016-08-23 MED ORDER — LORAZEPAM 1 MG PO TABS: 1.0000 mg | ORAL_TABLET | Freq: Once | ORAL | 0 refills | Status: AC

## 2016-08-23 NOTE — Patient Instructions (Addendum)
Please make an appointment to see your gynecologist.  Your physician has requested that you go to the basement for the following lab work before leaving today: CBC, CMP, CRP, lipase, amylase, Hep C antibody  We have sent the following medications to your pharmacy for you to pick up at your convenience: Zofran 8 mg three times daily as needed (increase from previous dosage)  Continue omeprazole 40 mg twice daily before meals. ______________________________________________________________________________________________ Dennis Bast have been scheduled for a CT scan of the abdomen and pelvis at [facility] ([address]---this is in the same building as [facility]).   You are scheduled on Thursday, 08/26/16 at 3:00 pm. You should arrive 15 minutes prior to your appointment time for registration. Please follow the written instructions below on the day of your exam:  WARNING: IF YOU ARE ALLERGIC TO IODINE/X-RAY DYE, PLEASE NOTIFY RADIOLOGY IMMEDIATELY AT [phone]! YOU WILL BE GIVEN A 13 HOUR PREMEDICATION PREP.  1) Do not eat or drink anything after 11:00 am (4 hours prior to your test) 2) You have been given 2 bottles of oral contrast to drink. The solution may taste better if refrigerated, but do NOT add ice or any other liquid to this solution. Shake well before drinking.    Drink 1 bottle of contrast @ 1:00 pm (2 hours prior to your exam)  Drink 1 bottle of contrast @ 2:00 pm (1 hour prior to your exam)  Take Ativan at 2:15 pm. You MUST have a driver for your test once you take the ativan.  You may take any medications as prescribed with a small amount of water except for the following: Metformin, Glucophage, Glucovance, Avandamet, Riomet, Fortamet, Actoplus Met, Janumet, Glumetza or Metaglip. The above medications must be held the day of the exam AND 48 hours after the exam.  The purpose of you drinking the oral contrast is to aid in the visualization of your intestinal  tract. The contrast solution may cause some diarrhea. Before your exam is started, you will be given a small amount of fluid to drink. Depending on your individual set of symptoms, you may also receive an intravenous injection of x-ray contrast/dye. Plan on being at [facility] for 30 minutes or longer, depending on the type of exam you are having performed.  This test typically takes 30-45 minutes to complete.  If you have any questions regarding your exam or if you need to reschedule, you may call the CT department at [phone] between the hours of 8:00 am and 5:00 pm, Monday-Friday.  ________________________________________________________________________  Dennis Bast have been scheduled for a HIDA scan at [facility] (1st floor) on Tuesday, 08/31/2016. Please arrive 15 minutes prior to your scheduled appointment at  11:94 am. Make certain not to have anything to eat or drink at least 6 hours prior to your test. Also be certain not to take TRAMADOL 6 hours prior to test either. Should this appointment date or time not work well for you, please call radiology scheduling at [phone].  _____________________________________________________________________ hepatobiliary (HIDA) scan is an imaging procedure used to diagnose problems in the liver, gallbladder and bile ducts. In the HIDA scan, a radioactive chemical or tracer is injected into a vein in your arm. The tracer is handled by the liver like bile. Bile is a fluid produced and excreted by your liver that helps your digestive system break down fats in the foods you eat. Bile is stored in your gallbladder and the gallbladder releases the bile when you  eat a meal. A special nuclear medicine scanner (gamma camera) tracks the flow of the tracer from your liver into your gallbladder and small intestine.  During your HIDA scan  You'll be asked to change into a hospital gown before your HIDA scan begins. Your health care team will position you  on a table, usually on your back. The radioactive tracer is then injected into a vein in your arm.The tracer travels through your bloodstream to your liver, where it's taken up by the bile-producing cells. The radioactive tracer travels with the bile from your liver into your gallbladder and through your bile ducts to your small intestine.You may feel some pressure while the radioactive tracer is injected into your vein. As you lie on the table, a special gamma camera is positioned over your abdomen taking pictures of the tracer as it moves through your body. The gamma camera takes pictures continually for about an hour. You'll need to keep still during the HIDA scan. This can become uncomfortable, but you may find that you can lessen the discomfort by taking deep breaths and thinking about other things. Tell your health care team if you're uncomfortable. The radiologist will watch on a computer the progress of the radioactive tracer through your body. The HIDA scan may be stopped when the radioactive tracer is seen in the gallbladder and enters your small intestine. This typically takes about an hour. In some cases extra imaging will be performed if original images aren't satisfactory, if morphine is given to help visualize the gallbladder or if the medication CCK is given to look at the contraction of the gallbladder. This test typically takes 2 hours to complete. ________________________________________________________________________ If you are age 39 or older, your body mass index should be between 23-30. Your Body mass index is 35.88 kg/m. If this is out of the aforementioned range listed, please consider follow up with your Primary Care Provider.  If you are age 8 or younger, your body mass index should be between 19-25. Your Body mass index is 35.88 kg/m. If this is out of the aformentioned range listed, please consider follow up with your Primary Care Provider.
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
## 2016-08-23 NOTE — Telephone Encounter (Signed)
error 

## 2016-08-24 LAB — HEPATITIS C ANTIBODY: HCV AB: NEGATIVE

## 2016-08-26 ENCOUNTER — Ambulatory Visit (INDEPENDENT_AMBULATORY_CARE_PROVIDER_SITE_OTHER): Payer: Medicaid Other | Admitting: Family Medicine

## 2016-08-26 ENCOUNTER — Ambulatory Visit (INDEPENDENT_AMBULATORY_CARE_PROVIDER_SITE_OTHER)
Admission: RE | Admit: 2016-08-26 | Discharge: 2016-08-26 | Disposition: A | Discharge status: Home / Self Care | Payer: Medicaid Other | Source: Ambulatory Visit | Attending: Internal Medicine | Admitting: Internal Medicine

## 2016-08-26 ENCOUNTER — Encounter: Payer: Self-pay | Admitting: Family Medicine

## 2016-08-26 DIAGNOSIS — E785 Hyperlipidemia, unspecified: Secondary | ICD-10-CM

## 2016-08-26 DIAGNOSIS — E038 Other specified hypothyroidism: Secondary | ICD-10-CM

## 2016-08-26 DIAGNOSIS — R1011 Right upper quadrant pain: Secondary | ICD-10-CM | POA: Diagnosis not present

## 2016-08-26 DIAGNOSIS — E890 Postprocedural hypothyroidism: Secondary | ICD-10-CM | POA: Diagnosis not present

## 2016-08-26 DIAGNOSIS — R112 Nausea with vomiting, unspecified: Secondary | ICD-10-CM

## 2016-08-26 DIAGNOSIS — R21 Rash and other nonspecific skin eruption: Secondary | ICD-10-CM

## 2016-08-26 MED ORDER — IOPAMIDOL (ISOVUE-300) INJECTION 61%
100.0000 mL | Freq: Once | INTRAVENOUS | Status: AC | PRN
  Administered 2016-08-26: 100 mL via INTRAVENOUS

## 2016-08-26 MED ORDER — LEVOTHYROXINE SODIUM 175 MCG PO TABS: 175.0000 ug | ORAL_TABLET | Freq: Every day | ORAL | 3 refills | Status: DC

## 2016-08-26 NOTE — Assessment & Plan Note (Signed)
Controlled. Last TSH and free T4 shows adequate control. Currently on levothyroxine. --Provided refill of levothyroxine 175 mcg daily

## 2016-08-26 NOTE — Patient Instructions (Signed)
Thank you for coming in to see [place] today. Please see below to review our plan for today's visit.  1. You have many refills on her prescriptions with exception of your levothyroxine which I lysed and no refill 4. As for your oxycodone, need to call the office on Monday to have your prescription refilled. I do not see for this medication and cannot refill this today. Unfortunately will have to come in to get the prescription. 2. Return to the clinic at 3 months  Please call the clinic at [phone] if your symptoms worsen or you have any concerns. It was my pleasure to see you. -- Harriet But[place], [place], PGY-1

## 2016-08-26 NOTE — Assessment & Plan Note (Signed)
Elevated LDL and cholesterol unless the check. Previously in pravastatin by history. Patient interested in rechecking cholesterol levels. --Discussed with patient need for recheck in one year --Informed patient to restart pravastatin as this is indicated and follow-up with PCP

## 2016-08-26 NOTE — Progress Notes (Signed)
   Subjective:   Patient ID: [name]    DOB: [birth year], 51 y.o. female   MRN: [id number]  CC: "Medication refill"  HPI: [name] is a 51 y.o. female who presents to clinic today for medication refill. Problems discussed today are as follows:  Hypothyroidism: Currently on levothyroxine. Has history of thyroidectomy. Tolerating medication well without side effects. States she needs a refill. No other complaints. ROS: Denies change in weight, constipation or diarrhea, heat or cold intolerance.  Hyperlipidemia: Patient recently with elevated cholesterol and LDL on last lipid panel one month ago. States she is not taking her cholesterol medication but cannot recall what the name of medications.  Complete ROS performed, see HPI for pertinent.  [place]: COPD, hypothyroidism, HLD, hidradenitis appear to, anxiety and depression, tobacco use disorder, GERD, obesity, episodic mood disorder. H/o thyroidectomy, tubal ligation, I&D hematoma. Mother h/o DM, HTN, HLD, father h/o DM, heart disease, HLD, HTN. Smoking status reviewed. Medications reviewed.  Objective:   BP 110/70   Pulse 68   Temp 98.3 F (36.8 C) (Oral)   Wt 243 lb (110.2 kg)   LMP 08/18/2016   SpO2 99%   BMI 36.95 kg/m  Vitals and nursing note reviewed.  General: well nourished, well developed, in no acute distress with non-toxic appearance HEENT: normocephalic, atraumatic, moist mucous membranes Neck: supple, non-tender without lymphadenopathy CV: regular rate and rhythm without murmurs, rubs, or gallops, no lower extremity edema Lungs: clear to auscultation bilaterally with normal work of breathing Abdomen: soft, non-tender, non-distended, no masses or organomegaly palpable, normoactive bowel sounds Skin: warm, dry, no rashes or lesions, cap refill < 2 seconds Extremities: warm and well perfused, normal tone  Assessment & Plan:   Hypothyroidism Controlled. Last TSH and free T4 shows adequate control. Currently  on levothyroxine. --Provided refill of levothyroxine 175 mcg daily  Hyperlipidemia Elevated LDL and cholesterol unless the check. Previously in pravastatin by history. Patient interested in rechecking cholesterol levels. --Discussed with patient need for recheck in one year --Informed patient to restart pravastatin as this is indicated and follow-up with PCP  No orders of the defined types were placed in this encounter.  Meds ordered this encounter  Medications  . levothyroxine (SYNTHROID, LEVOTHROID) 175 MCG tablet    Sig: Take 1 tablet (175 mcg total) by mouth daily.    Dispense:  30 tablet    Refill:  [place], [place], PGY-1 08/26/2016 11:57 AM

## 2016-08-27 ENCOUNTER — Other Ambulatory Visit: Payer: Self-pay | Admitting: *Deleted

## 2016-08-27 DIAGNOSIS — I890 Lymphedema, not elsewhere classified: Secondary | ICD-10-CM

## 2016-08-27 MED ORDER — OXYCODONE-ACETAMINOPHEN 10-325 MG PO TABS: 1.0000 | ORAL_TABLET | Freq: Three times a day (TID) | ORAL | 0 refills | Status: DC | PRN

## 2016-08-27 NOTE — Telephone Encounter (Signed)
patient calling to request refill of:  Name of Medication(s):  oxycodone Last date of OV:  08/26/2016 (not with pcp) Pharmacy:  Patient to pick up.    Will route refill request to Clinic RN.  Discussed with patient policy to call pharmacy for future refills.  Also, discussed refills may take up to 48 hours to approve or deny.  HARTSELL,  JAZMIN

## 2016-08-30 ENCOUNTER — Other Ambulatory Visit: Payer: Self-pay | Admitting: Family Medicine

## 2016-08-30 NOTE — Telephone Encounter (Signed)
Patient came to office to get refill on RX Percocet. Please let patient know when she may pick this up .[phone].  Patient will be totally out tomorrow

## 2016-08-31 ENCOUNTER — Other Ambulatory Visit: Payer: Self-pay | Admitting: Family Medicine

## 2016-08-31 ENCOUNTER — Ambulatory Visit (HOSPITAL_COMMUNITY)
Admission: RE | Admit: 2016-08-31 | Discharge: 2016-08-31 | Disposition: A | Discharge status: Home / Self Care | Payer: Medicaid Other | Source: Ambulatory Visit | Attending: Internal Medicine | Admitting: Internal Medicine

## 2016-08-31 DIAGNOSIS — R112 Nausea with vomiting, unspecified: Secondary | ICD-10-CM | POA: Insufficient documentation

## 2016-08-31 DIAGNOSIS — I890 Lymphedema, not elsewhere classified: Secondary | ICD-10-CM

## 2016-08-31 DIAGNOSIS — R1011 Right upper quadrant pain: Secondary | ICD-10-CM | POA: Diagnosis present

## 2016-08-31 DIAGNOSIS — R21 Rash and other nonspecific skin eruption: Secondary | ICD-10-CM | POA: Insufficient documentation

## 2016-08-31 MED ORDER — TECHNETIUM TC 99M MEBROFENIN IV KIT
5.0000 | PACK | Freq: Once | INTRAVENOUS | Status: AC | PRN
  Administered 2016-08-31: 5 via INTRAVENOUS

## 2016-08-31 MED ORDER — OXYCODONE-ACETAMINOPHEN 10-325 MG PO TABS: 1.0000 | ORAL_TABLET | Freq: Three times a day (TID) | ORAL | 0 refills | Status: DC | PRN

## 2016-08-31 NOTE — Telephone Encounter (Signed)
Pt is totally out of her percocet today. RX shows it was printed 08-27-16 but it is not up front.  Pt would like to pick it up this morning .

## 2016-08-31 NOTE — Telephone Encounter (Signed)
I have refilled it and will leave the new prescription at front desk for her to pick up.

## 2016-08-31 NOTE — Telephone Encounter (Signed)
I have printed a new one and left the Rx at front desk for her to pick up.

## 2016-09-01 NOTE — Telephone Encounter (Signed)
Rx has already been picked up.

## 2016-09-02 ENCOUNTER — Other Ambulatory Visit: Payer: Self-pay

## 2016-09-02 DIAGNOSIS — R1030 Lower abdominal pain, unspecified: Secondary | ICD-10-CM

## 2016-09-14 ENCOUNTER — Telehealth: Payer: Self-pay | Admitting: Family Medicine

## 2016-09-14 NOTE — Telephone Encounter (Signed)
Pt is calling because [place] the [name] duty and wants to be excused. 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 I explained that her doctor will not be in until 1:30 today. She would like the letter written and faxed to [phone] Att: Myriam Forehand. jw

## 2016-09-15 ENCOUNTER — Other Ambulatory Visit: Payer: Self-pay | Admitting: Family Medicine

## 2016-09-15 ENCOUNTER — Telehealth: Payer: Self-pay | Admitting: Internal Medicine

## 2016-09-15 ENCOUNTER — Other Ambulatory Visit: Payer: Self-pay

## 2016-09-15 DIAGNOSIS — R21 Rash and other nonspecific skin eruption: Secondary | ICD-10-CM

## 2016-09-15 NOTE — Telephone Encounter (Signed)
Let pt know that we tried to refer her to several OBGYN offices but they do not take Medicaid. Let pt know if she can find one that will take Medicaid I would be happy to make the referral, that is why we referred her to the clinic. Pt verbalized understanding.

## 2016-09-15 NOTE — Telephone Encounter (Signed)
Pt is asking if a referral can be sent to an OBGYN office closer to her home near [facility].

## 2016-09-15 NOTE — Telephone Encounter (Signed)
[facility] Pt has not heard anything regarding the appt. Office states they will contact the pt regarding the appt. Pt given the phone number of the clinic [phone] to follow-up if she does not hear back from them regarding the appt.

## 2016-09-16 NOTE — Telephone Encounter (Signed)
Was unable to address this issue until now.  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

## 2016-09-16 NOTE — Telephone Encounter (Signed)
Patient states that this is no longer needed and was dismissed that day from her duty. Shantel Helwig,CMA

## 2016-09-17 ENCOUNTER — Telehealth: Payer: Self-pay | Admitting: Internal Medicine

## 2016-09-17 NOTE — Telephone Encounter (Signed)
Call placed to [facility], they are closed. Will try to refer pt on Monday, she has Medicaid.

## 2016-09-20 ENCOUNTER — Encounter: Payer: Self-pay | Admitting: Family Medicine

## 2016-09-21 NOTE — Telephone Encounter (Signed)
Called office and they are not accepting new medicaid patients. Pt aware.

## 2016-09-22 NOTE — Telephone Encounter (Signed)
Hey Dr. Reesa Chew. Can you please send a referral for this patient to see [facility] for fibroids, pain, and an associated rash during her cycle. Medicaid requires the names PCP to place the referral. Thank you.

## 2016-09-22 NOTE — Telephone Encounter (Signed)
Per [facility] the pt has [place] Medicaid and referral has to be from pts PCP on Medicaid card. Please send referral for pt to see a physician at this office for fibroids, pain and a rash that precedes her cycle. Dr. Hilarie Fredrickson tried to make the referral and the office states it has to be from PCP. Please assist with this.

## 2016-09-22 NOTE — Telephone Encounter (Signed)
Referral faxed to [facility] for referral. Dr. Marvel Plan is not seeing Medicaid pts but another physician in the practice would see pt. Message left for pt.

## 2016-09-23 ENCOUNTER — Other Ambulatory Visit: Payer: Self-pay | Admitting: Family Medicine

## 2016-09-23 DIAGNOSIS — D259 Leiomyoma of uterus, unspecified: Secondary | ICD-10-CM

## 2016-09-23 NOTE — Telephone Encounter (Signed)
No problem, I just placed the referral. Thanks!

## 2016-09-24 ENCOUNTER — Other Ambulatory Visit: Payer: Self-pay | Admitting: Family Medicine

## 2016-09-24 DIAGNOSIS — I890 Lymphedema, not elsewhere classified: Secondary | ICD-10-CM

## 2016-09-24 NOTE — Telephone Encounter (Signed)
Pt calling to request refill of:  Name of Medication(s):  Percocet    Last date of OV: 08-26-16 Pharmacy:    Will route refill request to Clinic RN.  Discussed with patient policy to call pharmacy for future refills.  Also, discussed refills may take up to 48 hours to approve or deny.  [name]  Pt states she was unable to keep appointment next with PCP because she has to go out of town. Pt would needs to pick this Rx up today. ep

## 2016-09-24 NOTE — Telephone Encounter (Signed)
2nd request. Pt states she will be stopping by before we close to try and get this medication today. ep

## 2016-09-28 NOTE — Telephone Encounter (Signed)
I would appreciate it if she didn't do that again and actually came to follow up visits.  I have explained to her before that she needs to see me for refills.

## 2016-09-29 ENCOUNTER — Ambulatory Visit: Payer: Medicaid Other | Admitting: Family Medicine

## 2016-10-15 ENCOUNTER — Encounter: Payer: Self-pay | Admitting: *Deleted

## 2016-10-21 ENCOUNTER — Encounter: Payer: Medicaid Other | Admitting: Family Medicine

## 2016-10-22 ENCOUNTER — Encounter: Payer: Self-pay | Admitting: Family Medicine

## 2016-10-22 ENCOUNTER — Ambulatory Visit (INDEPENDENT_AMBULATORY_CARE_PROVIDER_SITE_OTHER): Payer: Medicaid Other | Admitting: Family Medicine

## 2016-10-22 DIAGNOSIS — R21 Rash and other nonspecific skin eruption: Secondary | ICD-10-CM

## 2016-10-22 DIAGNOSIS — I890 Lymphedema, not elsewhere classified: Secondary | ICD-10-CM | POA: Diagnosis not present

## 2016-10-22 DIAGNOSIS — N898 Other specified noninflammatory disorders of vagina: Secondary | ICD-10-CM | POA: Diagnosis not present

## 2016-10-22 MED ORDER — ESTRADIOL 0.1 MG/GM VA CREA: 1.0000 | TOPICAL_CREAM | Freq: Every day | VAGINAL | 1 refills | Status: DC

## 2016-10-22 MED ORDER — OXYCODONE-ACETAMINOPHEN 10-325 MG PO TABS: 1.0000 | ORAL_TABLET | Freq: Three times a day (TID) | ORAL | 0 refills | Status: DC | PRN

## 2016-10-22 MED ORDER — FLUCONAZOLE 150 MG PO TABS: 150.0000 mg | ORAL_TABLET | Freq: Once | ORAL | 0 refills | Status: AC

## 2016-10-22 NOTE — Progress Notes (Signed)
Subjective:   Patient ID: [name]    DOB: [birth year], 51 y.o. female   MRN: [id number]  CC: abdominal rash   HPI: [name] is a 51 y.o. female who presents to clinic today for follow up on abdominal rash.    Abdominal rash:  -Noted everytime she has her menstrual cycle, she has a large area of swelling and pain. Between RUQ and RLQ.   Rash is burning in nature and itchy.  Did not take a Benadryl.  Has been using Clobetasol cream over the area.  Has been happening every month for the past 3 years.  Has also been seen by GI for this and has not found a particular etiology.    She was referred to Gynecology as it only comes on during her menstrual cycle.  Waiting for Gyn appointment.  She states it may possibly be a fungal infection as she has frequent yeast infections.  She has been seen in the ED several times for this rash in the past however no obvious causes.  Denies fevers, chills, abdominal pain, nausea, diarrhea.  Denies CP, SOB. Has some vaginal irritation and itching, would like a prescription for diflucan today.   Has been taking zofran for nausea.    Vaginal dryness Reports she has been having painful dryness in the vaginal area.  Hurts when she urinates however she denies burning with urination.  Denies frequency and urgency.   ROS: Denies fevers, chills, vomiting, diarrhea, abdominal pain.  Denies chest pain, shortness of breath.  [place]: Pertinent past medical, surgical, family, and social history were reviewed and updated as appropriate. Smoking status reviewed.  Medications reviewed.   Objective:   BP 110/70 (BP Location: Right Arm, Patient Position: Sitting, Cuff Size: Normal)   Pulse 69   Temp 98.2 F (36.8 C) (Oral)   Ht 5\' 8"  (1.727 m)   Wt 247 lb 9.6 oz (112.3 kg)   LMP 10/14/2016   SpO2 96%   BMI 37.65 kg/m  Vitals and nursing note reviewed.    General: 51 yo female, NAD  HEENT: NCAT, MMM, o/p clear  Neck: supple  CV: RRR no MRG  Lungs: CTAB,  comfortable wob  Abdomen: soft, NTND, +bs  Skin: warm, minimal area of erythema over right abdomen, no warmth or raised lesions, nontender with palpation  Extremities: warm and well perfused  Neuro: alert, normal tone  Psych: appears anxious   Assessment & Plan:   Rash and nonspecific skin eruption Of unclear etiology.  With history of monthly rash with onset of menstrual cycle.  Localized to R side of abdomen and burning in nature.   Associated with itching.   -Rash currently is not present however pt has phone pictures which do appear similar to welts. No recent antibiotic use.  Unlikely shingles given waxing and waning of symptoms w/ monthly menstrual cycle.   Extensive GI workup has been performed and not attributed to gallbladder/liver source as underlying etiology.  -Possible this may be related to her menstrual cycle -- has been referred to Gynecology and is waiting for an appt.  -Will give Rx for Diflucan 150 mg x1 in the meantime for symptoms of yeast infection  -Will continue to follow   Vaginal dryness Likely related to menopause onset .  -Will give Rx for estradiol cream   Meds ordered this encounter  Medications  . estradiol (ESTRACE) 0.1 MG/GM vaginal cream    Sig: Place 1 Applicatorful vaginally at bedtime.  Dispense:  42.5 g    Refill:  1  . fluconazole (DIFLUCAN) 150 MG tablet    Sig: Take 1 tablet (150 mg total) by mouth once.    Dispense:  1 tablet    Refill:  0  . oxyCODONE-acetaminophen (PERCOCET) 10-325 MG tablet    Sig: Take 1 tablet by mouth every 8 (eight) hours as needed for pain.    Dispense:  84 tablet    Refill:  0   Follow up: 1 month   Lovenia Kim, MD [place], PGY-1 10/29/2016 1:59 PM

## 2016-10-22 NOTE — Patient Instructions (Signed)
You were seen for follow up of your abdominal rash.  As we discussed, it is possible that this may be related to a gynecologic problem since it occurs with onset of your menstrual cycle.    I have sent Diflucan to your pharmacy for yeast infection.   Additionally, I will send in estradiol cream for your vaginal dryness which I think will help with most of your symptoms.

## 2016-10-29 DIAGNOSIS — N898 Other specified noninflammatory disorders of vagina: Secondary | ICD-10-CM | POA: Insufficient documentation

## 2016-10-29 DIAGNOSIS — N951 Menopausal and female climacteric states: Secondary | ICD-10-CM | POA: Insufficient documentation

## 2016-10-29 NOTE — Assessment & Plan Note (Signed)
Of unclear etiology.  With history of monthly rash with onset of menstrual cycle.  Localized to R side of abdomen and burning in nature.   Associated with itching.   -Rash currently is not present however pt has phone pictures which do appear similar to welts. No recent antibiotic use.  Unlikely shingles given waxing and waning of symptoms w/ monthly menstrual cycle.   Extensive GI workup has been performed and not attributed to gallbladder/liver source as underlying etiology.  -Possible this may be related to her menstrual cycle -- has been referred to Gynecology and is waiting for an appt.  -Will give Rx for Diflucan 150 mg x1 in the meantime for symptoms of yeast infection  -Will continue to follow

## 2016-10-29 NOTE — Assessment & Plan Note (Signed)
Likely related to menopause onset .  -Will give Rx for estradiol cream

## 2016-11-18 ENCOUNTER — Ambulatory Visit: Payer: Medicaid Other | Admitting: Internal Medicine

## 2016-11-23 ENCOUNTER — Ambulatory Visit: Payer: Medicaid Other | Admitting: Internal Medicine

## 2016-11-24 ENCOUNTER — Encounter: Payer: Self-pay | Admitting: Family Medicine

## 2016-11-24 ENCOUNTER — Ambulatory Visit (INDEPENDENT_AMBULATORY_CARE_PROVIDER_SITE_OTHER): Payer: Medicaid Other | Admitting: Family Medicine

## 2016-11-24 ENCOUNTER — Other Ambulatory Visit (HOSPITAL_COMMUNITY)
Admission: RE | Admit: 2016-11-24 | Discharge: 2016-11-24 | Disposition: A | Discharge status: Home / Self Care | Payer: Medicaid Other | Source: Ambulatory Visit | Attending: Family Medicine | Admitting: Family Medicine

## 2016-11-24 VITALS — BP 130/82 | HR 69 | Temp 98.2°F | Ht 68.0 in | Wt 255.2 lb

## 2016-11-24 DIAGNOSIS — N898 Other specified noninflammatory disorders of vagina: Secondary | ICD-10-CM

## 2016-11-24 DIAGNOSIS — N951 Menopausal and female climacteric states: Secondary | ICD-10-CM

## 2016-11-24 DIAGNOSIS — R350 Frequency of micturition: Secondary | ICD-10-CM

## 2016-11-24 DIAGNOSIS — I890 Lymphedema, not elsewhere classified: Secondary | ICD-10-CM

## 2016-11-24 LAB — POCT URINALYSIS DIP (MANUAL ENTRY)
BILIRUBIN UA: NEGATIVE
GLUCOSE UA: NEGATIVE mg/dL
Ketones, POC UA: NEGATIVE mg/dL
LEUKOCYTES UA: NEGATIVE
Nitrite, UA: NEGATIVE
Protein Ur, POC: NEGATIVE mg/dL
Spec Grav, UA: 1.025 (ref 1.010–1.025)
Urobilinogen, UA: 0.2 E.U./dL
pH, UA: 5.5 (ref 5.0–8.0)

## 2016-11-24 MED ORDER — ESTRADIOL 10 MCG VA TABS: 1.0000 | ORAL_TABLET | Freq: Every day | VAGINAL | 0 refills | Status: AC

## 2016-11-24 MED ORDER — OXYCODONE-ACETAMINOPHEN 10-325 MG PO TABS: 1.0000 | ORAL_TABLET | Freq: Three times a day (TID) | ORAL | 0 refills | Status: DC | PRN

## 2016-11-24 NOTE — Patient Instructions (Signed)
For vaginal dryness I have sent in another prescription for a medication that is Medicaid preferred called Vagifem.  Take one tablet daily for the next month and I anticipate this will help with your symptoms.  For your urinary frequency, we checked your urine today for infection.  I will inform you of the results once they come back.  You can follow up in 1 month.    Be well,  Lovenia Kim, MD

## 2016-11-24 NOTE — Progress Notes (Signed)
   Subjective:   Patient ID: [name]    DOB: 26-Mar-1965, 51 y.o. female   MRN: [id number]  CC: urinary frequency   HPI: [name] is a 51 y.o. female who presents to clinic today for increased urinary frequency and vaginal dryness.  Urinary frequency  Symptoms have been present for 2-3 days.  Feels like she may have diabetes and wants her sugar checked.  Denies burning with urination, denies urgency.  Reports she has not had menstruation in 2 months.  Concerned she is undergoing menopause and this is making her urinate more.  She reports she is still having symptoms of menstrual cycle ie cramping and bloating but does not have any bleeding.   Denies vaginal discharge, itching and irritation to the area.  Has not noted any blood in her urine.    Vaginal dryness Was previously prescribed Estrace at last visit however unable to pick up because Medicaid does not cover.  She would like to try another option today.    ROS: Denies fevers, chills, nausea, vomiting, diarrhea.  Denies dysuria.  [place]: Pertinent past medical, surgical, family, and social history were reviewed and updated as appropriate. Smoking status reviewed. Medications reviewed.  Objective:   BP 130/82   Pulse 69   Temp 98.2 F (36.8 C) (Oral)   Ht 5\' 8"  (1.727 m)   Wt 255 lb 3.2 oz (115.8 kg)   SpO2 98%   BMI 38.80 kg/m  Vitals and nursing note reviewed.   General: alert, NAD  CV: RRR no MRG  Lungs: CTAB, breathing is non-labored  Abdomen: soft, NTND, +bs, no flank pain or CVA tenderness  Skin: warm, dry  Psych: appears anxious   Assessment & Plan:   Perimenopausal symptom Pt with symptoms of menopause including hot flashes, vaginal dryness.  Still with cramping and bloating however no menstrual bleeding x2 months now.  -Discussed that she is likely perimenopausal given her age  -will continue to monitor   Vaginal dryness Estrace not Medicaid preferred.   -Rx given for Vagifem estradiol 10 mg tabs    -follow up in 1 month   Urinary frequency Associated with menstrual cycle.  Has not had any burning with urination or urgency.  Afebrile without red flags on exam.   -She is worried this is related to diabetes however UA with no glucose or signs of infection, negative urine GC today -previous A1c within normal range, would not recommend rechecking this  -reassurance given -will continue to monitor  Orders Placed This Encounter  Procedures  . POCT urinalysis dipstick   Meds ordered this encounter  Medications  . Estradiol 10 MCG TABS vaginal tablet    Sig: Place 1 tablet (10 mcg total) vaginally daily.    Dispense:  30 tablet    Refill:  0  . oxyCODONE-acetaminophen (PERCOCET) 10-325 MG tablet    Sig: Take 1 tablet by mouth every 8 (eight) hours as needed for pain.    Dispense:  84 tablet    Refill:  0   Follow up: 1 month or sooner if needed   Lovenia Kim, MD [place], PGY-2 11/26/2016 10:29 AM

## 2016-11-25 ENCOUNTER — Ambulatory Visit: Payer: Medicaid Other | Admitting: Internal Medicine

## 2016-11-25 ENCOUNTER — Telehealth: Payer: Self-pay | Admitting: Internal Medicine

## 2016-11-25 LAB — URINE CYTOLOGY ANCILLARY ONLY
CHLAMYDIA, DNA PROBE: NEGATIVE
NEISSERIA GONORRHEA: NEGATIVE

## 2016-11-25 NOTE — Telephone Encounter (Signed)
Spoke with pt and let her know that her PCP has to make the referral due to her insurance. Informed her the last referral to GYN from her PCP states they were unable to reach pt. Encouraged her to check with her PCP regarding the referral.

## 2016-11-26 DIAGNOSIS — N951 Menopausal and female climacteric states: Secondary | ICD-10-CM | POA: Insufficient documentation

## 2016-11-26 NOTE — Assessment & Plan Note (Addendum)
Associated with menstrual cycle.  Has not had any burning with urination or urgency.  Afebrile without red flags on exam.   -She is worried this is related to diabetes however UA with no glucose or signs of infection, negative urine GC today -previous A1c within normal range, would not recommend rechecking this  -reassurance given -will continue to monitor

## 2016-11-26 NOTE — Assessment & Plan Note (Signed)
Estrace not Medicaid preferred.   -Rx given for Vagifem estradiol 10 mg tabs  -follow up in 1 month

## 2016-11-26 NOTE — Assessment & Plan Note (Signed)
Pt with symptoms of menopause including hot flashes, vaginal dryness.  Still with cramping and bloating however no menstrual bleeding x2 months now.  -Discussed that she is likely perimenopausal given her age  -will continue to monitor

## 2016-12-01 ENCOUNTER — Other Ambulatory Visit: Payer: Self-pay | Admitting: Family Medicine

## 2016-12-01 ENCOUNTER — Telehealth: Payer: Self-pay | Admitting: Family Medicine

## 2016-12-01 DIAGNOSIS — M5126 Other intervertebral disc displacement, lumbar region: Secondary | ICD-10-CM

## 2016-12-01 MED ORDER — PREGABALIN 150 MG PO CAPS: 150.0000 mg | ORAL_CAPSULE | Freq: Two times a day (BID) | ORAL | 4 refills | Status: DC

## 2016-12-01 NOTE — Telephone Encounter (Signed)
Attempted to call.  Number will not go thru.  Will try again later. Fleeger, Salome Spotted, CMA

## 2016-12-01 NOTE — Telephone Encounter (Signed)
Please call patient to let her know I have refilled her Lyrica.  Leaving Rx at the front office for her to pick-up.  Thanks

## 2016-12-02 NOTE — Telephone Encounter (Signed)
Please inform patient to ensure she is well hydrated while working out as this is the most likely cause of muscle cramping.  Muscle relaxants are reserved for more serious conditions ie muscle spasm.

## 2016-12-02 NOTE — Telephone Encounter (Signed)
Pt informed.  She wants to know if Dr. Reesa Chew will Rx her a muscle relaxer.  She has started exercising and she now getting cramps in her calves.  Fleeger, [name], CMA

## 2016-12-08 NOTE — Telephone Encounter (Signed)
Pt informed. Deseree Blount, CMA  

## 2016-12-22 ENCOUNTER — Ambulatory Visit (INDEPENDENT_AMBULATORY_CARE_PROVIDER_SITE_OTHER): Payer: Medicaid Other | Admitting: Family Medicine

## 2016-12-22 ENCOUNTER — Encounter: Payer: Self-pay | Admitting: Family Medicine

## 2016-12-22 DIAGNOSIS — L02214 Cutaneous abscess of groin: Secondary | ICD-10-CM | POA: Diagnosis not present

## 2016-12-22 DIAGNOSIS — I890 Lymphedema, not elsewhere classified: Secondary | ICD-10-CM

## 2016-12-22 DIAGNOSIS — M5126 Other intervertebral disc displacement, lumbar region: Secondary | ICD-10-CM | POA: Diagnosis not present

## 2016-12-22 MED ORDER — DOXYCYCLINE HYCLATE 100 MG PO TABS: 100.0000 mg | ORAL_TABLET | Freq: Two times a day (BID) | ORAL | 0 refills | Status: DC

## 2016-12-22 MED ORDER — BUDESONIDE 32 MCG/ACT NA SUSP
2.0000 | Freq: Every day | NASAL | 2 refills | Status: DC
Start: 2016-12-22 — End: 2017-08-02

## 2016-12-22 MED ORDER — OXYCODONE-ACETAMINOPHEN 10-325 MG PO TABS: 1.0000 | ORAL_TABLET | Freq: Three times a day (TID) | ORAL | 0 refills | Status: DC | PRN

## 2016-12-22 MED ORDER — QUETIAPINE FUMARATE 50 MG PO TABS: 50.0000 mg | ORAL_TABLET | Freq: Every day | ORAL | 3 refills | Status: DC

## 2016-12-22 NOTE — Patient Instructions (Addendum)
You were seen in clinic for the boil in your right groin.  I have sent in a course of antibiotics for you to take twice daily for 10 days.  You can take Tylenol if you have fevers over the next few days but if the boil does not resolve and you are having symptoms, you may need to return to clinic for an incision and drainage procedure.  Additionally as we discussed, warm compresses can help with the swelling.     Please call clinic if you have any questions.     Be well,  Lauren Kim, MD   Hidradenitis Suppurativa Hidradenitis suppurativa is a long-term (chronic) skin disease that starts with blocked sweat glands or hair follicles. Bacteria may grow in these blocked openings of your skin. Hidradenitis suppurativa is like a severe form of acne that develops in areas of your body where acne would be unusual. It is most likely to affect the areas of your body where skin rubs against skin and becomes moist. This includes your:  Underarms.  Groin.  Genital areas.  Buttocks.  Upper thighs.  Breasts.  Hidradenitis suppurativa may start out with small pimples. The pimples can develop into deep sores that break open (rupture) and drain pus. Over time your skin may thicken and become scarred. Hidradenitis suppurativa cannot be passed from person to person. What are the causes? The exact cause of hidradenitis suppurativa is not known. This condition may be due to:  Female and female hormones. The condition is rare before and after puberty.  An overactive body defense system (immune system). Your immune system may overreact to the blocked hair follicles or sweat glands and cause swelling and pus-filled sores.  What increases the risk? You may have a higher risk of hidradenitis suppurativa if you:  Are a woman.  Are between ages 23 and 39.  Have a family history of hidradenitis suppurativa.  Have a personal history of acne.  Are overweight.  Smoke.  Take the drug lithium.  What are  the signs or symptoms? The first signs of an outbreak are usually painful skin bumps that look like pimples. As the condition progresses:  Skin bumps may get bigger and grow deeper into the skin.  Bumps under the skin may rupture and drain smelly pus.  Skin may become itchy and infected.  Skin may thicken and scar.  Drainage may continue through tunnels under the skin (fistulas).  Walking and moving your arms can become painful.  How is this diagnosed? Your health care provider may diagnose hidradenitis suppurativa based on your medical history and your signs and symptoms. A physical exam will also be done. You may need to see a health care provider who specializes in skin diseases (dermatologist). You may also have tests done to confirm the diagnosis. These can include:  Swabbing a sample of pus or drainage from your skin so it can be sent to the lab and tested for infection.  Blood tests to check for infection.  How is this treated? The same treatment will not work for everybody with hidradenitis suppurativa. Your treatment will depend on how severe your symptoms are. You may need to try several treatments to find what works best for you. Part of your treatment may include cleaning and bandaging (dressing) your wounds. You may also have to take medicines, such as the following:  Antibiotics.  Acne medicines.  Medicines to block or suppress the immune system.  A diabetes medicine (metformin) is sometimes used to treat  this condition.  For women, birth control pills can sometimes help relieve symptoms.  You may need surgery if you have a severe case of hidradenitis suppurativa that does not respond to medicine. Surgery may involve:  Using a laser to clear the skin and remove hair follicles.  Opening and draining deep sores.  Removing the areas of skin that are diseased and scarred.  Follow these instructions at home:  Learn as much as you can about your disease, and work  closely with your health care providers.  Take medicines only as directed by your health care provider.  If you were prescribed an antibiotic medicine, finish it all even if you start to feel better.  If you are overweight, losing weight may be very helpful. Try to reach and maintain a healthy weight.  Do not use any tobacco products, including cigarettes, chewing tobacco, or electronic cigarettes. If you need help quitting, ask your health care provider.  Do not shave the areas where you get hidradenitis suppurativa.  Do not wear deodorant.  Wear loose-fitting clothes.  Try not to overheat and get sweaty.  Take a daily bleach bath as directed by your health care provider. ? Fill your bathtub halfway with water. ? Pour in  cup of unscented household bleach. ? Soak for 5-10 minutes.  Cover sore areas with a warm, clean washcloth (compress) for 5-10 minutes. Contact a health care provider if:  You have a flare-up of hidradenitis suppurativa.  You have chills or a fever.  You are having trouble controlling your symptoms at home. This information is not intended to replace advice given to you by your health care provider. Make sure you discuss any questions you have with your health care provider. Document Released: 10/07/2003 Document Revised: 07/31/2015 Document Reviewed: 05/25/2013 Elsevier Interactive Patient Education  2018 [employer].

## 2016-12-22 NOTE — Assessment & Plan Note (Addendum)
Likely 2/2 hidradenitis suppurativa with history of recurrent boils in groin area.  Denies fevers, chills, drainage.  Physical exam without red flags.  Discussed antibiotic treatment vs I&D in clinic if not improved.  -Rx : Doxy 100 mg BID x 10 days course - would benefit from applying warm compresses to affected area   -if not resolved, patient will RTC for I&D of the boil -discussed weight loss as well as wearing loose-fit comfortable clothing to reduce risk of recurrence

## 2016-12-22 NOTE — Progress Notes (Signed)
   Subjective:   Patient ID: [name]    DOB: Mar 19, 1965, 51 y.o. female   MRN: [id number]  CC: boil in right groin area   HPI: [name] is a 51 y.o. female who presents to clinic today for medication refill and right groin boil .    Right groin boil -Has a history of hydradenitis suppurativa -Area has not started to drain  -Only painful when it is pressed upon by tight clothing  -Denies fevers, chills, nausea, vomiting -No lymphadenopathy; does not report boils elsewhere on the body  -No numbness or tingling  -Has had this multiple times in the past and has resolved with antibiotics  -no other concerns at this time   ROS: Denies fevers, chills, nausea, vomiting, diarrhea.   [place]: Pertinent past medical, surgical, family, and social history were reviewed and updated as appropriate. Smoking status reviewed. Medications reviewed.  Objective:   BP (!) 142/98   Pulse 67   Temp 98.1 F (36.7 C) (Oral)   Ht 5\' 8"  (1.727 m)   Wt 254 lb 12.8 oz (115.6 kg)   SpO2 97%   BMI 38.74 kg/m  Vitals and nursing note reviewed.  General: 51 yo female, NAD, appears comfortable in exam room  CV: RRR no MRG  Lungs: CTAB, non-laboured  Abdomen: soft, NTND, +bs  Skin: warm, dry, 1.5 cm mobile subcutaneous boil noted in right groin, TTP, no drainage, surrounding erythema or significant swelling appreciate  Extremities: warm and well perfused, normal tone  Assessment & Plan:   Inguinal abscess Likely 2/2 hidradenitis suppurativa with history of recurrent boils in groin area.  Denies fevers, chills, drainage.  Physical exam without red flags.  Discussed antibiotic treatment vs I&D in clinic if not improved.  -Rx : Doxy 100 mg BID x 10 days course - would benefit from applying warm compresses to affected area   -if not resolved, patient will RTC for I&D of the boil -discussed weight loss as well as wearing loose-fit comfortable clothing to reduce risk of recurrence   Refills  provided for chronic medications.   Meds ordered this encounter  Medications  . QUEtiapine (SEROQUEL) 50 MG tablet    Sig: Take 1 tablet (50 mg total) by mouth at bedtime.    Dispense:  30 tablet    Refill:  3  . oxyCODONE-acetaminophen (PERCOCET) 10-325 MG tablet    Sig: Take 1 tablet by mouth every 8 (eight) hours as needed for pain.    Dispense:  84 tablet    Refill:  0  . doxycycline (VIBRA-TABS) 100 MG tablet    Sig: Take 1 tablet (100 mg total) by mouth 2 (two) times daily.    Dispense:  20 tablet    Refill:  0  . budesonide (RHINOCORT AQUA) 32 MCG/ACT nasal spray    Sig: Place 2 sprays into both nostrils daily.    Dispense:  8.6 g    Refill:  2   Follow up: PRN   Lovenia Kim, MD [place], PGY-2 12/22/2016 2:37 PM

## 2016-12-23 ENCOUNTER — Telehealth: Payer: Self-pay | Admitting: *Deleted

## 2016-12-23 NOTE — Telephone Encounter (Signed)
Received fax from [facility] requesting prior authorization of oxycodone-acetaminophen 10-325 .  Spoke with Dr. Reesa Chew and ok to complete prior authorization in [place] based on previous authorization request from 06/2016.  Burna Forts, BSN, RN-BC  Prior approval for med completed via [facility].  Awaiting approval for med #[id number] W.  Will check [place] Tracks in 24 hours.   Nolene Ebbs, RN

## 2016-12-24 ENCOUNTER — Telehealth: Payer: Self-pay | Admitting: *Deleted

## 2016-12-24 NOTE — Telephone Encounter (Signed)
Received fax from [facility] requesting prior authorization of Lyrica 150mg  .  Completed prior authorization in [place] Tracks based on previous authorization request from 12/2015.  Approval received and pharmacy informed. Jaleen Grupp, Salome Spotted, CMA

## 2016-12-24 NOTE — Telephone Encounter (Signed)
Prior approval for ocycodone completed via [place] Tracks.  Med approved for 12/23/16 - 01/24/17.  Patient informed and has picked up med from pharmacy.  Burna Forts, BSN, RN-BC

## 2016-12-28 ENCOUNTER — Telehealth: Payer: Self-pay | Admitting: Family Medicine

## 2016-12-28 NOTE — Telephone Encounter (Signed)
Pt has a boil that is almost ready to burst. It is in her groin area.  She wanted to get it lanced this morning but there are no appts.  It is making her nauseous.  Please advise

## 2016-12-29 ENCOUNTER — Ambulatory Visit: Payer: Medicaid Other | Admitting: Internal Medicine

## 2016-12-29 NOTE — Telephone Encounter (Signed)
Please call pt to tell her that if she is unable to get an appointment she may have to be seen at an urgent care for that procedure.  Thanks

## 2017-01-17 ENCOUNTER — Ambulatory Visit (INDEPENDENT_AMBULATORY_CARE_PROVIDER_SITE_OTHER): Payer: Medicaid Other | Admitting: Family Medicine

## 2017-01-17 DIAGNOSIS — L02214 Cutaneous abscess of groin: Secondary | ICD-10-CM | POA: Diagnosis not present

## 2017-01-17 DIAGNOSIS — I890 Lymphedema, not elsewhere classified: Secondary | ICD-10-CM

## 2017-01-17 DIAGNOSIS — F419 Anxiety disorder, unspecified: Secondary | ICD-10-CM

## 2017-01-17 DIAGNOSIS — F329 Major depressive disorder, single episode, unspecified: Secondary | ICD-10-CM | POA: Diagnosis not present

## 2017-01-17 MED ORDER — OXYCODONE-ACETAMINOPHEN 10-325 MG PO TABS: 1.0000 | ORAL_TABLET | Freq: Three times a day (TID) | ORAL | 0 refills | Status: DC | PRN

## 2017-01-17 MED ORDER — SULFAMETHOXAZOLE-TRIMETHOPRIM 800-160 MG PO TABS: 1.0000 | ORAL_TABLET | Freq: Two times a day (BID) | ORAL | 0 refills | Status: DC

## 2017-01-17 MED ORDER — CLOBETASOL PROPIONATE 0.05 % EX CREA: TOPICAL_CREAM | Freq: Two times a day (BID) | CUTANEOUS | 6 refills | Status: DC

## 2017-01-17 MED ORDER — ALPRAZOLAM 0.5 MG PO TABS: 0.5000 mg | ORAL_TABLET | Freq: Every evening | ORAL | 0 refills | Status: DC | PRN

## 2017-01-17 NOTE — Progress Notes (Signed)
   Subjective:   Patient ID: [name]    DOB: [birth year], 51 y.o. female   MRN: [id number]  CC:  Follow-up abscess   HPI: [name] is a 51 y.o. female who presents to clinic today to follow up on R groin abscess   Groin abscess  Was previously prescribed a course of Doxycycline which she reports is not working for her.  Had tried to get an appointment scheduled in clinic for I&D because her abscess had come to a head.  She drained it at home and would like to try a different antibiotic.    Anxiety Patient states she is visiting family this week for 2-3 days and it is the anniversary of her mother's death.  She experiences severe anxiety surrounding this visit when she did it last year and requests something for her nerves.     ROS: Denies fevers, chills, nausea, vomiting, diarrhea.   [place]: Pertinent past medical, surgical, family, and social history were reviewed and updated as appropriate. Smoking status reviewed.  Medications reviewed.  Objective:   BP (!) 142/88   Pulse 66   Temp 98.5 F (36.9 C) (Oral)   Wt 256 lb (116.1 kg)   SpO2 99%   BMI 38.92 kg/m  Vitals and nursing note reviewed.  General: 51 year old female, NAD  CV: RRR no MRG  Lungs: CTAB, non-laboured  Abdomen: soft, nontender, +bs, R groin tender to palpation with 2 cm area of fluctuance, no surrounding erythema or warmth, scar tissue formation from prior I&D  Skin: warm, dry  Extremities: warm and well perfused   Assessment & Plan:   Inguinal abscess Minimal improvement.  Patient has completed course of doxy however feels it has not worked to reduce the infection much.   -trial Bactrim DS BID x7 days  -encouraged her to continue warm compresses -RTC precautions if feels like it is ready to be I&D'd   Anxiety and depression Reports immense anxiety surrounding the anniversary of her mother's death and a family gathering coming up.  Requests a small dose of medication to calm her nerves while  away for 2-3 days.  -Discussed deep breathing exercises and relaxation techniques in addition to a short course of Xanax to help with her symptoms -Rx for Xanax 0.5 mg x 4 tabs  -continue to monitor   Meds ordered this encounter  Medications  . sulfamethoxazole-trimethoprim (BACTRIM DS) 800-160 MG tablet    Sig: Take 1 tablet 2 (two) times daily by mouth.    Dispense:  14 tablet    Refill:  0  . ALPRAZolam (XANAX) 0.5 MG tablet    Sig: Take 1 tablet (0.5 mg total) at bedtime as needed by mouth for anxiety.    Dispense:  4 tablet    Refill:  0  . oxyCODONE-acetaminophen (PERCOCET) 10-325 MG tablet    Sig: Take 1 tablet every 8 (eight) hours as needed by mouth for pain.    Dispense:  84 tablet    Refill:  0  . clobetasol cream (TEMOVATE) 0.05 %    Sig: Apply 2 (two) times daily topically.    Dispense:  60 g    Refill:  6   Follow up: 1 mo  Lovenia Kim, MD [place], PGY-2 01/17/2017 4:57 PM

## 2017-01-17 NOTE — Patient Instructions (Addendum)
You were seen in clinic for follow up of your abscess.  I am prescribing you a different antibiotic called Bactrim which may help you more than the doxycycline did.  Take this as prescribed twice a day for the full course of 7 days.  Additionally, continue using warm compresses over the area.  If you feel it may be coming to a head again and need it drained you can call clinic to make an appointment.   Be well, Lovenia Kim, MD

## 2017-01-17 NOTE — Assessment & Plan Note (Signed)
Minimal improvement.  Patient has completed course of doxy however feels it has not worked to reduce the infection much.   -trial Bactrim DS BID x7 days  -encouraged her to continue warm compresses -RTC precautions if feels like it is ready to be I&D'd

## 2017-01-17 NOTE — Assessment & Plan Note (Signed)
Reports immense anxiety surrounding the anniversary of her mother's death and a family gathering coming up.  Requests a small dose of medication to calm her nerves while away for 2-3 days.  -Discussed deep breathing exercises and relaxation techniques in addition to a short course of Xanax to help with her symptoms -Rx for Xanax 0.5 mg x 4 tabs  -continue to monitor

## 2017-01-24 ENCOUNTER — Other Ambulatory Visit: Payer: Self-pay | Admitting: Family Medicine

## 2017-01-24 NOTE — Telephone Encounter (Signed)
Pt called and said pharmacy has to hold her refill on oxycodone until dr authorizes it. Pharmacy is telling the pt the dr only authorized in for a month (last month) this time instead of for the 6 months like dr usually does. Please advise

## 2017-01-25 ENCOUNTER — Telehealth: Payer: Self-pay | Admitting: Family Medicine

## 2017-01-25 NOTE — Telephone Encounter (Signed)
I have sent in the form to [facility] Tracks to be processed. Please inform patient, thanks

## 2017-01-26 NOTE — Telephone Encounter (Signed)
Needs her script for Percocet authorized, for her chronic lymphedema.  She does not want to be without it all weekend.  Best number to call her is [phone].  Patient says medicaid is denying it because they say she is not being seen here at [facility] for lymphedema.  Please advise her what to do if this cannot be authorized/filled.

## 2017-01-26 NOTE — Telephone Encounter (Signed)
Patient has been trying to get someone to answer about getting her prescription for pain meds filled.  It was sent to be authorized on 11/16 and patient is asking for at least enough to get her to next appointment. I informed her that script was sent by PCP to be authorized.  Please call patient and let her know what is going on.  Thank you.

## 2017-02-02 NOTE — Telephone Encounter (Signed)
On 01-24-17 provider completed [place] tracks for this medication.  Will check with RN team to see if there has been an update. Jazmin Hartsell,CMA

## 2017-02-02 NOTE — Telephone Encounter (Signed)
Contacted [facility].  States they received form but could not use Dr. Reesa Chew.  Representative says that they faxed back this info, but I cant find this info.  Recompleted form with Dr. Mike Craze NPI and he signed.  Faxed again to [phone]. Zeev Deakins, Salome Spotted, CMA

## 2017-02-02 NOTE — Telephone Encounter (Signed)
Unsure why the first time did not work.  Thanks Janett Billow and [employer].

## 2017-02-03 NOTE — Telephone Encounter (Signed)
Called patient back after she called RN line.  Patient was not approved for tramadol for her lymphadema because her insurance would not accept a resident (Dr. Reesa Chew) on the request. I explained to her that Dr. Andria Frames had given his NPI number per previous notes and signed and that because he is a faculty member all should be fine now. Patient said that she appreciated it but needed reimbursement for the 70 dollars that she had to pay when it was rejected by her insurance company.  She wanted to now if [facility] would reimburse or the pharmacy because she is on disability. I informed her that our office could not and would not reimburse her but that she could call medicaid and see what she needed to do.[name]

## 2017-02-03 NOTE — Telephone Encounter (Signed)
Left voice message that the form had been faxed to [place] tracks.Ozella Almond

## 2017-02-11 ENCOUNTER — Other Ambulatory Visit: Payer: Self-pay | Admitting: Family Medicine

## 2017-02-22 ENCOUNTER — Ambulatory Visit (INDEPENDENT_AMBULATORY_CARE_PROVIDER_SITE_OTHER): Payer: Medicaid Other | Admitting: Family Medicine

## 2017-02-22 ENCOUNTER — Encounter: Payer: Self-pay | Admitting: Family Medicine

## 2017-02-22 ENCOUNTER — Other Ambulatory Visit: Payer: Self-pay

## 2017-02-22 VITALS — BP 110/68 | HR 71 | Temp 98.9°F | Ht 68.0 in | Wt 249.0 lb

## 2017-02-22 DIAGNOSIS — H539 Unspecified visual disturbance: Secondary | ICD-10-CM | POA: Diagnosis present

## 2017-02-22 DIAGNOSIS — Z23 Encounter for immunization: Secondary | ICD-10-CM

## 2017-02-22 DIAGNOSIS — I890 Lymphedema, not elsewhere classified: Secondary | ICD-10-CM | POA: Diagnosis not present

## 2017-02-22 DIAGNOSIS — H5789 Other specified disorders of eye and adnexa: Secondary | ICD-10-CM

## 2017-02-22 DIAGNOSIS — R05 Cough: Secondary | ICD-10-CM

## 2017-02-22 DIAGNOSIS — R058 Other specified cough: Secondary | ICD-10-CM

## 2017-02-22 MED ORDER — DIPHENHYDRAMINE HCL 50 MG PO TABS: 50.0000 mg | ORAL_TABLET | Freq: Every evening | ORAL | 0 refills | Status: DC | PRN

## 2017-02-22 MED ORDER — OXYCODONE-ACETAMINOPHEN 10-325 MG PO TABS: 1.0000 | ORAL_TABLET | Freq: Three times a day (TID) | ORAL | 0 refills | Status: DC | PRN

## 2017-02-22 MED ORDER — BENZONATATE 100 MG PO CAPS: 100.0000 mg | ORAL_CAPSULE | Freq: Two times a day (BID) | ORAL | 0 refills | Status: DC | PRN

## 2017-02-22 MED ORDER — PRAVASTATIN SODIUM 40 MG PO TABS: 40.0000 mg | ORAL_TABLET | Freq: Every day | ORAL | 3 refills | Status: DC

## 2017-02-22 MED ORDER — ARTIFICIAL TEARS OPHTHALMIC OINT: TOPICAL_OINTMENT | Freq: Three times a day (TID) | OPHTHALMIC | 1 refills | Status: DC

## 2017-02-22 NOTE — Progress Notes (Signed)
Subjective:   Patient ID: [name]    DOB: [birth year], 51 y.o. female   MRN: [id number]  HPI: [name] is a 51 y.o. female who presents to clinic today for cough.  Cough Present x 12 days.  She has been taking Mucinex, Benadryl, Robitussin, and Nyquil however these have not been helping.    Cough is productive of yellow sputum, nonbloody.  Worse when laying down.  Denies congestion, nausea, vomiting.  No fevers, chills, abdominal pain or diarrhea.  No sore throat or sinus pressure.   Eye discharge Increased discharge bilaterally since 1 year which has recently worsened.  Discharge is yellow-white.  Feels like there is a sheet of "gunk" that comes down over her pupils and makes it difficult to see.  She reports her peripheral vision is worse.  She does not see an eye doctor.  She has some eye pain and pressure sometimes.  Denies fevers, chills.  She does not wear glasses or contacts. She has not tried using any eyedrops or other medications at home.    ROS: No fevers, chills, nausea, vomiting, diarrhea.  No chest pain or shortness of breath.  [place]: Pertinent past medical, surgical, family, and social history were reviewed and updated as appropriate. Smoking status reviewed - former smoker, quit in 2015  Medications reviewed.  Objective:   BP 110/68   Pulse 71   Temp 98.9 F (37.2 C) (Oral)   Ht 5\' 8"  (1.727 m)   Wt 249 lb (112.9 kg)   LMP 11/23/2016   SpO2 98%   BMI 37.86 kg/m  Vitals and nursing note reviewed.  General: 51 yo female, NAD  HEENT: NCAT, EOMI, PERRL, no conjunctival injection or tearing, no obvious corneal abrasions, MMM, mild rhinorrhea  Neck: supple CV: RRR no MRG  Lungs: CTAB,  No wheeze, non-laboured  Abdomen: soft, NTND, +bs  Skin: warm, dry, no rash Extremities: warm and well perfused, normal tone  Assessment & Plan:   Eye discharge Afebrile and otherwise well appearing.  Does not appear infectious in etiology although she does have a  concurrent URI.  No red flags on physical exam. No redness, tearing, or obvious corneal abrasions or injury.  -Recommend artificial tears -will refer to ophthalmology for worsening peripheral vision  -return precautions discussed   Post-viral cough syndrome Symptoms likely consistent with post-viral cough syndrome; anticipate this will resolve over the next week or so.  She is otherwise well appearing and hydrated. -Advise plenty of fluids ie water, soups, broths -Rx Tessalon pearles -Return precautions discussed   Orders Placed This Encounter  Procedures  . Flu Vaccine QUAD 36+ mos IM  . Ambulatory referral to Ophthalmology    Referral Priority:   Routine    Referral Type:   Consultation    Referral Reason:   Specialty Services Required    Requested Specialty:   Ophthalmology    Number of Visits Requested:   1   Meds ordered this encounter  Medications  . benzonatate (TESSALON) 100 MG capsule    Sig: Take 1 capsule (100 mg total) by mouth 2 (two) times daily as needed for cough.    Dispense:  20 capsule    Refill:  0  . diphenhydrAMINE (BENADRYL) 50 MG tablet    Sig: Take 1 tablet (50 mg total) by mouth at bedtime as needed for itching.    Dispense:  30 tablet    Refill:  0  . oxyCODONE-acetaminophen (PERCOCET) 10-325 MG tablet  Sig: Take 1 tablet by mouth every 8 (eight) hours as needed for pain.    Dispense:  84 tablet    Refill:  0  . artificial tears (LACRILUBE) OINT ophthalmic ointment    Sig: Place into both eyes 3 (three) times daily.    Dispense:  5 g    Refill:  1  . pravastatin (PRAVACHOL) 40 MG tablet    Sig: Take 1 tablet (40 mg total) by mouth daily.    Dispense:  90 tablet    Refill:  3    Lovenia Kim, MD [place], PGY-2 02/27/2017 8:25 PM

## 2017-02-22 NOTE — Patient Instructions (Signed)
You were seen in clinic for cough which is most likely due to a post-viral syndrome and can sometimes last several weeks.  I am prescribing you some tessalon pearles for you to take twice a day for about 10 days.  You can continue to take Mucinex and Tylenol as needed.  Additionally, we discussed your vision issues.  After examining your eye, I do not feel this is related to an infection however I would recommend having your eyes checked by an ophthalmologist. You can use artificial tears that I have prescribed to keep your eyes moist and this may help with some of the discomfort you are having.   I have placed a referral and you should hear back within a week regarding your appointment time and date.  If you have any questions please feel free to call the clinic.  Be well, Lovenia Kim, MD

## 2017-02-24 ENCOUNTER — Telehealth: Payer: Self-pay | Admitting: Family Medicine

## 2017-02-24 NOTE — Telephone Encounter (Signed)
Pt is calling and would like a different cough syrup called in since the original one is not covered by Medicaid. jw

## 2017-02-27 DIAGNOSIS — R05 Cough: Secondary | ICD-10-CM | POA: Insufficient documentation

## 2017-02-27 DIAGNOSIS — R058 Other specified cough: Secondary | ICD-10-CM | POA: Insufficient documentation

## 2017-02-27 DIAGNOSIS — H5789 Other specified disorders of eye and adnexa: Secondary | ICD-10-CM | POA: Insufficient documentation

## 2017-02-27 NOTE — Assessment & Plan Note (Signed)
Afebrile and otherwise well appearing.  Does not appear infectious in etiology although she does have a concurrent URI.  No red flags on physical exam. No redness, tearing, or obvious corneal abrasions or injury.  -Recommend artificial tears -will refer to ophthalmology for worsening peripheral vision  -return precautions discussed

## 2017-02-27 NOTE — Assessment & Plan Note (Signed)
Symptoms likely consistent with post-viral cough syndrome; anticipate this will resolve over the next week or so.  She is otherwise well appearing and hydrated. -Advise plenty of fluids ie water, soups, broths -Rx Tessalon pearles -Return precautions discussed

## 2017-03-10 ENCOUNTER — Other Ambulatory Visit: Payer: Self-pay | Admitting: Family Medicine

## 2017-03-10 ENCOUNTER — Telehealth: Payer: Self-pay | Admitting: Family Medicine

## 2017-03-10 MED ORDER — PSEUDOEPHEDRINE-GUAIFENESIN 30-200 MG PO TABS: 1.0000 | ORAL_TABLET | Freq: Four times a day (QID) | ORAL | 0 refills | Status: DC | PRN

## 2017-03-10 NOTE — Telephone Encounter (Signed)
I have sent in a different medication that should be covered, please inform patient.

## 2017-03-10 NOTE — Telephone Encounter (Signed)
Will forward to Dr. Amin to advise. Jazmin Hartsell,CMA  

## 2017-03-10 NOTE — Telephone Encounter (Signed)
Pt would like something called in for her cough she still has. Pt was in the office 12/18 for her cough and she said the medication she was given still has not helped with her cough because her cough hasnt changed. Her pharmacy is [facility] on [address].

## 2017-03-11 NOTE — Telephone Encounter (Signed)
Pt informed and she also wants a refill on her antibiotic. Deseree Kennon Holter, CMA

## 2017-03-11 NOTE — Telephone Encounter (Signed)
She would need to make an appointment with a provider -- we unfortunately cannot refill an antibiotic without seeing patient.

## 2017-03-14 DIAGNOSIS — H04123 Dry eye syndrome of bilateral lacrimal glands: Secondary | ICD-10-CM | POA: Diagnosis not present

## 2017-03-14 NOTE — Telephone Encounter (Signed)
Informed pt when I talked to her that she would need to make and appt if it wasn't getting better, and she said you refilled it last time. Will call pt back and inform her. Deseree Kennon Holter, CMA

## 2017-03-17 NOTE — Telephone Encounter (Signed)
Pt informed that she had to make an appt. Tiki Tucciarone Kennon Holter, CMA

## 2017-03-22 NOTE — Telephone Encounter (Signed)
PA for oxycodone approved for 02/02/2017 - 08/01/2017  Prior approval # [id number].  Documentation placed in scan box.  Velora Heckler, RN

## 2017-03-25 ENCOUNTER — Ambulatory Visit: Payer: Medicaid Other | Admitting: Family Medicine

## 2017-03-25 VITALS — BP 110/70 | HR 75 | Temp 98.2°F | Ht 68.0 in | Wt 244.4 lb

## 2017-03-25 DIAGNOSIS — R200 Anesthesia of skin: Secondary | ICD-10-CM

## 2017-03-25 DIAGNOSIS — R202 Paresthesia of skin: Secondary | ICD-10-CM

## 2017-03-25 DIAGNOSIS — I890 Lymphedema, not elsewhere classified: Secondary | ICD-10-CM | POA: Diagnosis not present

## 2017-03-25 DIAGNOSIS — E890 Postprocedural hypothyroidism: Secondary | ICD-10-CM | POA: Diagnosis not present

## 2017-03-25 DIAGNOSIS — M25551 Pain in right hip: Secondary | ICD-10-CM | POA: Insufficient documentation

## 2017-03-25 DIAGNOSIS — M7918 Myalgia, other site: Secondary | ICD-10-CM

## 2017-03-25 DIAGNOSIS — E038 Other specified hypothyroidism: Secondary | ICD-10-CM | POA: Diagnosis not present

## 2017-03-25 DIAGNOSIS — Z1231 Encounter for screening mammogram for malignant neoplasm of breast: Secondary | ICD-10-CM

## 2017-03-25 MED ORDER — SULFAMETHOXAZOLE-TRIMETHOPRIM 800-160 MG PO TABS: 1.0000 | ORAL_TABLET | Freq: Two times a day (BID) | ORAL | 0 refills | Status: DC

## 2017-03-25 MED ORDER — OXYCODONE-ACETAMINOPHEN 10-325 MG PO TABS: 1.0000 | ORAL_TABLET | Freq: Three times a day (TID) | ORAL | 0 refills | Status: DC | PRN

## 2017-03-25 MED ORDER — LEVOTHYROXINE SODIUM 175 MCG PO TABS: 175.0000 ug | ORAL_TABLET | Freq: Every day | ORAL | 3 refills | Status: DC

## 2017-03-25 MED ORDER — OMEPRAZOLE 40 MG PO CPDR: 40.0000 mg | DELAYED_RELEASE_CAPSULE | Freq: Two times a day (BID) | ORAL | 3 refills | Status: DC

## 2017-03-25 MED ORDER — KETOROLAC TROMETHAMINE 60 MG/2ML IM SOLN
60.0000 mg | Freq: Once | INTRAMUSCULAR | Status: AC
  Administered 2017-03-25: 60 mg via INTRAMUSCULAR

## 2017-03-25 NOTE — Patient Instructions (Addendum)
I would like you to continue taking your Prilosec as your chronic cough has lasted for months and seems to be due to reflux. I do not think it is due to a viral infection any longer as you do not have any symptoms otherwise.  I have refilled your Percocet, Bactrim and thyroid medication and these are available for you to pick up.  Additionally, you received a Toradol injection for your hip pain in clinic today.    I evaluated you for wrist pain and your symptoms may be related to carpal tunnel syndrome.  I am giving you a prescription for a wrist splint which I want you to wear at night.  If you continue to have worsening symptoms at your next visit, we can consider sending you for nerve testing again since it has been many years since your last one.   We are scheduling you for your mammogram and you will have your Pap done at your next visit.

## 2017-03-25 NOTE — Progress Notes (Signed)
Subjective:   Patient ID: [name]    DOB: February 11, 1966, 52 y.o. female   MRN: [id number]  CC: medication refill, wrist pain   HPI: [name] is a 52 y.o. female who presents to clinic today for R hip pain, medication refill, and wrist pain.  R hip pain -Patient with longstanding history of chronic pain; reports her hip pain is worse over the last week -She states she used to get Toradol injections for this at [facility] clinic a very long time ago but has not recently as she had been feeling better -no history of recent trauma or fall -Pain 5/10, localized to R buttock, does not radiate; dull and boring in nature  -taking Percocet for pain  Hand pain and numbness -Endorses bilateral hand numbness and tingling, L>R, worse when asleep -has been using her cousin's wrist splint but she is asking for it back so patient would like to have one  -not able to use left hand to pick up heavy objects  -has not taken antiinflammatories  -tries icepack but does not help much  -she reports she had an EMG 15 years ago, she does not remember what the result was and I do not see a report in the chart    ROS: Denies fevers, chills, nausea, vomiting, diarrhea, abdominal pain. +Numbness and tingling.  Social: pt is a former smoker.  Medications reviewed.  Objective:   BP 110/70 (BP Location: Left Arm, Patient Position: Sitting, Cuff Size: Normal)   Pulse 75   Temp 98.2 F (36.8 C) (Oral)   Ht 5\' 8"  (1.727 m)   Wt 244 lb 6.4 oz (110.9 kg)   SpO2 98%   BMI 37.16 kg/m  Vitals and nursing note reviewed.  General: 52 yo female, NAD  CV: RRR no MRG  Lungs: CTAB, nonlaboured  Skin: warm, dry, no rash Extremities: R hip - no bony deformities noted, no erythema or swelling overlying joint, ROM is normal in all directions, minimal TTP over greater trochanter, normal sensation   L Wrist: Inspection normal with no visible erythema or swelling. ROM smooth and normal with good flexion and extension  and ulnar/radial deviation that is symmetrical with opposite wrist. Palpation is normal over metacarpals, navicular, lunate, and TFCC; tendons without tenderness/ swelling Strength 5/5 in all directions without pain. Negative Finkelstein, tinel's and phalens.  Assessment & Plan:   Numbness and tingling of both legs Signs and symptoms c/w possible carpal tunnel although tinel's phalens and finkelsteins negative.  Patient with h/o hypothyroidism which may contribute.  She has normal ROM of wrists bilaterally and normal sensation.  No red flags on exam.  Of note, she states she had an EMG about 15 years ago but I do not see records of this nor does patient recall results.  -Rx provided for L cock-up wrist splint  -could consider repeat EMG if symptoms persist for further evaluation  -return precautions discussed    Right hip pain Reports right-sided chronic hip and buttock pain.  Pt with longstanding history of chronic pain related complaints.  She states a toradol injection has helped in the past.  -IM toradol 60 mg x 1 given in clinic -Percocet refilled for this month  -will continue to monitor   Orders Placed This Encounter  Procedures  . MM Digital Screening    Standing Status:   Future    Standing Expiration Date:   05/24/2018    Order Specific Question:   Reason for Exam (SYMPTOM  OR DIAGNOSIS REQUIRED)    Answer:   screening mammogram    Order Specific Question:   Is the patient pregnant?    Answer:   No    Order Specific Question:   Preferred imaging location?    Answer:   [facility]   Meds ordered this encounter  Medications  . oxyCODONE-acetaminophen (PERCOCET) 10-325 MG tablet    Sig: Take 1 tablet by mouth every 8 (eight) hours as needed for pain.    Dispense:  84 tablet    Refill:  0  . omeprazole (PRILOSEC) 40 MG capsule    Sig: Take 1 capsule (40 mg total) by mouth 2 (two) times daily.    Dispense:  60 capsule    Refill:  3  . levothyroxine (SYNTHROID,  LEVOTHROID) 175 MCG tablet    Sig: Take 1 tablet (175 mcg total) by mouth daily.    Dispense:  90 tablet    Refill:  3  . ketorolac (TORADOL) injection 60 mg  . sulfamethoxazole-trimethoprim (BACTRIM DS) 800-160 MG tablet    Sig: Take 1 tablet by mouth 2 (two) times daily.    Dispense:  14 tablet    Refill:  0    Lovenia Kim, MD [place], PGY-2 03/25/2017 7:45 PM

## 2017-03-25 NOTE — Assessment & Plan Note (Signed)
Signs and symptoms c/w possible carpal tunnel although tinel's phalens and finkelsteins negative.  Patient with h/o hypothyroidism which may contribute.  She has normal ROM of wrists bilaterally and normal sensation.  No red flags on exam.  Of note, she states she had an EMG about 15 years ago but I do not see records of this nor does patient recall results.  -Rx provided for L cock-up wrist splint  -could consider repeat EMG if symptoms persist for further evaluation  -return precautions discussed

## 2017-03-25 NOTE — Assessment & Plan Note (Addendum)
Reports right-sided chronic hip and buttock pain.  Pt with longstanding history of chronic pain related complaints.  She states a toradol injection has helped in the past.  -IM toradol 60 mg x 1 given in clinic -Percocet refilled for this month  -will continue to monitor

## 2017-04-27 ENCOUNTER — Ambulatory Visit: Payer: Medicaid Other

## 2017-04-29 ENCOUNTER — Ambulatory Visit (INDEPENDENT_AMBULATORY_CARE_PROVIDER_SITE_OTHER): Payer: Medicaid Other | Admitting: Family Medicine

## 2017-04-29 ENCOUNTER — Ambulatory Visit: Payer: Medicaid Other | Admitting: Family Medicine

## 2017-04-29 VITALS — BP 125/80 | HR 78 | Temp 98.3°F | Wt 247.0 lb

## 2017-04-29 DIAGNOSIS — R631 Polydipsia: Secondary | ICD-10-CM | POA: Diagnosis not present

## 2017-04-29 DIAGNOSIS — R358 Other polyuria: Secondary | ICD-10-CM

## 2017-04-29 DIAGNOSIS — I890 Lymphedema, not elsewhere classified: Secondary | ICD-10-CM

## 2017-04-29 DIAGNOSIS — R3589 Other polyuria: Secondary | ICD-10-CM

## 2017-04-29 DIAGNOSIS — E890 Postprocedural hypothyroidism: Secondary | ICD-10-CM | POA: Diagnosis not present

## 2017-04-29 DIAGNOSIS — L732 Hidradenitis suppurativa: Secondary | ICD-10-CM

## 2017-04-29 DIAGNOSIS — R1013 Epigastric pain: Secondary | ICD-10-CM

## 2017-04-29 DIAGNOSIS — E038 Other specified hypothyroidism: Secondary | ICD-10-CM

## 2017-04-29 LAB — POCT GLYCOSYLATED HEMOGLOBIN (HGB A1C): Hemoglobin A1C: 5

## 2017-04-29 MED ORDER — OXYCODONE-ACETAMINOPHEN 10-325 MG PO TABS: 1.0000 | ORAL_TABLET | Freq: Three times a day (TID) | ORAL | 0 refills | Status: DC | PRN

## 2017-04-29 MED ORDER — SULFAMETHOXAZOLE-TRIMETHOPRIM 800-160 MG PO TABS: 1.0000 | ORAL_TABLET | Freq: Two times a day (BID) | ORAL | 0 refills | Status: DC

## 2017-04-29 NOTE — Progress Notes (Addendum)
Subjective:    Patient ID: [name] , female   DOB: 02-Jun-1965 , 52 y.o..   MRN: [id number]  HPI   ILIANNA [name] is here for R groin boil and increased thirst.   Hidradenitis suppurativa Pt reports boils in her left and right groin area .  Has a history of HS and has taken bactrim and doxy in the past with improvement.  She states she feels one in her R groin that has not yet come to a head or started to drain.  She denies fevers, chills, nausea, vomiting.  She has been wearing looser fitting clothing as advised and has been keeping the area dry.  The area has been itchy and has scarred over from previous boils.  Painful when pressed upon.    Increased thirst Pt reports increase in thirst recently and has been drinking lots of water.  Last A1c 07/2016 and was 5.2.  She has a history of diabetes in her family and feels she may be a diabetic.  She also endorses vision issues ie blurry vision.  Denies double vision, dizziness, lightheadedness and falls.  She has been urinating more frequently over the last 1-2 months.  Denies discomfort or burning with urination.   Review of Systems: No fevers, chills, nausea, vomiting.  No burning with urination or urgency.  +urinary frequency.   Past Medical History: Patient Active Problem List   Diagnosis Date Noted  . Numbness and tingling in left hand 03/25/2017  . Right hip pain 03/25/2017  . Eye discharge 02/27/2017  . Post-viral cough syndrome 02/27/2017  . Perimenopausal symptom 11/26/2016  . Vaginal dryness 10/29/2016  . Restless legs 02/23/2016  . Acute back pain 07/02/2015  . Polyuria 02/10/2015  . Fatigue 02/10/2015  . 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 History of colonic polyps 06/06/2014  . Chronic RLQ pain 06/06/2014  . Nausea with vomiting 06/06/2014  . Rash and nonspecific skin eruption 12/31/2013  . 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 Ingrown right big toenail 07/02/2013  . Inguinal  abscess 07/02/2013  . Lateral epicondylitis 08/09/2012  . Chronic abdominal pain 04/04/2012  . Numbness and tingling of both legs 01/26/2012  . Premenstrual syndrome 09/20/2011  . Urinary frequency 09/15/2011  . GERD (gastroesophageal reflux disease) 06/24/2011  . Dyspepsia 06/24/2011  . Abdominal pain 05/26/2011  . Preventative health care 01/22/2011  . Obesity 01/22/2011  . Pelvic pain in female 12/29/2010  . History of irregular menstrual bleeding 04/11/2010  . Anxiety and depression 10/24/2009  . COPD (chronic obstructive pulmonary disease) ([place]) 05/09/2009  . Hyperlipidemia 01/05/2008  . Lymphedema 01/04/2007  . HIDRADENITIS SUPPURATIVA 08/17/2006  . Hypothyroidism 05/05/2006  . History of tobacco use 05/05/2006  . Episodic mood disorder ([place]) 05/05/2006  . 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 Her smoking use included cigarettes. She has a 12.50 pack-year smoking history. she has never used smokeless tobacco.   Objective:   BP 125/80   Pulse 78   Temp 98.3 F (36.8 C)   Wt 247 lb (112 kg)   BMI 37.56 kg/m  Physical Exam  Gen: 52 year old female, NAD Cardiac: RRR no MRG Respiratory: CTA B, nonlabored Gastrointestinal: Soft, NT ND, positive bowel sounds Skin: no rash, 1 cm x 0.5 cm indurated boil present in right groin, no fluctuance or purulence, no drainage, no surrounding erythema or warmth, tender to palpation Neurological:  no gross deficits.  Psych: good insight, normal mood and affect  Assessment & Plan:  HIDRADENITIS SUPPURATIVA R groin with 1cm boil; no purulence or fluctuance present and unlikely will be able to I&D at this time. No drainage.  No surrounding erythema, warmth or swelling suggestive of deeper infection. - Recommend antibiotic and continue to wear loose fitting clothing. Discussed with her that weight loss may help with this condition.  Should avoid  shaving to reduce chance of ingrown hairs.   -Advised to complete course of abx even if beginning to feel better.  -Bactrim DS prescribed x7 days  -May apply warm compresses to affected area prn   Hypothyroidism Currently on Synthroid and asymptomatic.   -check TSH   Increased thirst No red flags on exam.  Patient denies changes in diet or weight.  Last globin A1c 5.2 in 07/2016. In setting of family history of DM, obesity and polydipsia/polyuria, will screen for diabetes.   -A1c  Orders Placed This Encounter  Procedures  . TSH  . Ambulatory referral to Gastroenterology    Referral Priority:   Routine    Referral Type:   Consultation    Referral Reason:   Specialty Services Required    Number of Visits Requested:   1  . POCT glycosylated hemoglobin (Hb A1C)   Meds ordered this encounter  Medications  . oxyCODONE-acetaminophen (PERCOCET) 10-325 MG tablet    Sig: Take 1 tablet by mouth every 8 (eight) hours as needed for pain.    Dispense:  84 tablet    Refill:  0  . sulfamethoxazole-trimethoprim (BACTRIM DS) 800-160 MG tablet    Sig: Take 1 tablet by mouth 2 (two) times daily.    Dispense:  14 tablet    Refill:  0   Lovenia Kim, MD [place], PGY-2

## 2017-04-29 NOTE — Patient Instructions (Addendum)
You were seen in clinic today for follow-up of your abdominal pain and boils in your groin.  We discussed that these boils are likely due to hidradenitis suppurativa (HS) and can be due to a number of causes including tight clothing or hormonal changes.    I have sent in a prescription for an antibiotic and I would like for you to take this for the next 7 days.  Make sure you complete the full course even if you are feeling better.  If you develop any new or worsening symptoms, I would like for you to return to be seen by a provider.  I have checked your hemoglobin A1c today as you are having an increase in your thirst and vision changes.  This will check for diabetes.  We have also rechecked your TSH and I will let you know once these results are back.    For your abdominal pain, I would recommend continuing omeprazole as I feel your symptoms are most likely due to reflux.  I have sent in a referral to gastroenterology as requested by you, and you can expect a call within a week or so regarding scheduling an appointment. Please call clinic if you have any questions.  Lovenia Kim MD     Hidradenitis Suppurativa Hidradenitis suppurativa is a long-term (chronic) skin disease that starts with blocked sweat glands or hair follicles. Bacteria may grow in these blocked openings of your skin. Hidradenitis suppurativa is like a severe form of acne that develops in areas of your body where acne would be unusual. It is most likely to affect the areas of your body where skin rubs against skin and becomes moist. This includes your:  Underarms.  Groin.  Genital areas.  Buttocks.  Upper thighs.  Breasts.  Hidradenitis suppurativa may start out with small pimples. The pimples can develop into deep sores that break open (rupture) and drain pus. Over time your skin may thicken and become scarred. Hidradenitis suppurativa cannot be passed from person to person. What are the causes? The exact cause of  hidradenitis suppurativa is not known. This condition may be due to:  Female and female hormones. The condition is rare before and after puberty.  An overactive body defense system (immune system). Your immune system may overreact to the blocked hair follicles or sweat glands and cause swelling and pus-filled sores.  What increases the risk? You may have a higher risk of hidradenitis suppurativa if you:  Are a woman.  Are between ages 80 and 37.  Have a family history of hidradenitis suppurativa.  Have a personal history of acne.  Are overweight.  Smoke.  Take the drug lithium.  What are the signs or symptoms? The first signs of an outbreak are usually painful skin bumps that look like pimples. As the condition progresses:  Skin bumps may get bigger and grow deeper into the skin.  Bumps under the skin may rupture and drain smelly pus.  Skin may become itchy and infected.  Skin may thicken and scar.  Drainage may continue through tunnels under the skin (fistulas).  Walking and moving your arms can become painful.  How is this diagnosed? Your health care provider may diagnose hidradenitis suppurativa based on your medical history and your signs and symptoms. A physical exam will also be done. You may need to see a health care provider who specializes in skin diseases (dermatologist). You may also have tests done to confirm the diagnosis. These can include:  Swabbing a sample  of pus or drainage from your skin so it can be sent to the lab and tested for infection.  Blood tests to check for infection.  How is this treated? The same treatment will not work for everybody with hidradenitis suppurativa. Your treatment will depend on how severe your symptoms are. You may need to try several treatments to find what works best for you. Part of your treatment may include cleaning and bandaging (dressing) your wounds. You may also have to take medicines, such as the  following:  Antibiotics.  Acne medicines.  Medicines to block or suppress the immune system.  A diabetes medicine (metformin) is sometimes used to treat this condition.  For women, birth control pills can sometimes help relieve symptoms.  You may need surgery if you have a severe case of hidradenitis suppurativa that does not respond to medicine. Surgery may involve:  Using a laser to clear the skin and remove hair follicles.  Opening and draining deep sores.  Removing the areas of skin that are diseased and scarred.  Follow these instructions at home:  Learn as much as you can about your disease, and work closely with your health care providers.  Take medicines only as directed by your health care provider.  If you were prescribed an antibiotic medicine, finish it all even if you start to feel better.  If you are overweight, losing weight may be very helpful. Try to reach and maintain a healthy weight.  Do not use any tobacco products, including cigarettes, chewing tobacco, or electronic cigarettes. If you need help quitting, ask your health care provider.  Do not shave the areas where you get hidradenitis suppurativa.  Do not wear deodorant.  Wear loose-fitting clothes.  Try not to overheat and get sweaty.  Take a daily bleach bath as directed by your health care provider. ? Fill your bathtub halfway with water. ? Pour in  cup of unscented household bleach. ? Soak for 5-10 minutes.  Cover sore areas with a warm, clean washcloth (compress) for 5-10 minutes. Contact a health care provider if:  You have a flare-up of hidradenitis suppurativa.  You have chills or a fever.  You are having trouble controlling your symptoms at home. This information is not intended to replace advice given to you by your health care provider. Make sure you discuss any questions you have with your health care provider. Document Released: 10/07/2003 Document Revised: 07/31/2015 Document  Reviewed: 05/25/2013 Elsevier Interactive Patient Education  2018 [employer].

## 2017-04-30 LAB — TSH: TSH: 0.87 u[IU]/mL (ref 0.450–4.500)

## 2017-05-06 ENCOUNTER — Encounter: Payer: Self-pay | Admitting: Family Medicine

## 2017-05-06 NOTE — Assessment & Plan Note (Signed)
R groin with 1cm boil; no purulence or fluctuance present and unlikely will be able to I&D at this time. No drainage.  No surrounding erythema, warmth or swelling suggestive of deeper infection. - Recommend antibiotic and continue to wear loose fitting clothing. Discussed with her that weight loss may help with this condition.  Should avoid shaving to reduce chance of ingrown hairs.   -Advised to complete course of abx even if beginning to feel better.  -Bactrim DS prescribed x7 days  -May apply warm compresses to affected area prn

## 2017-05-06 NOTE — Assessment & Plan Note (Signed)
Currently on Synthroid and asymptomatic.   -check TSH

## 2017-05-18 ENCOUNTER — Ambulatory Visit: Payer: Medicaid Other

## 2017-05-23 ENCOUNTER — Other Ambulatory Visit: Payer: Self-pay

## 2017-05-26 ENCOUNTER — Other Ambulatory Visit: Payer: Self-pay | Admitting: Family Medicine

## 2017-05-26 MED ORDER — QUETIAPINE FUMARATE 50 MG PO TABS: 50.0000 mg | ORAL_TABLET | Freq: Every day | ORAL | 3 refills | Status: DC

## 2017-05-26 MED ORDER — ALPRAZOLAM 0.5 MG PO TABS: 0.5000 mg | ORAL_TABLET | Freq: Every evening | ORAL | 0 refills | Status: DC | PRN

## 2017-05-26 NOTE — Telephone Encounter (Signed)
Please let pt know I am refilling both, however next time she will need to be seen prior to receiving Xanax rx. Have left prescription at front desk.

## 2017-05-27 ENCOUNTER — Encounter: Payer: Self-pay | Admitting: Family Medicine

## 2017-05-27 ENCOUNTER — Ambulatory Visit (INDEPENDENT_AMBULATORY_CARE_PROVIDER_SITE_OTHER): Payer: Medicaid Other | Admitting: Family Medicine

## 2017-05-27 VITALS — BP 122/80 | HR 76 | Temp 98.7°F | Ht 68.0 in | Wt 246.6 lb

## 2017-05-27 DIAGNOSIS — N950 Postmenopausal bleeding: Secondary | ICD-10-CM | POA: Diagnosis present

## 2017-05-27 DIAGNOSIS — I890 Lymphedema, not elsewhere classified: Secondary | ICD-10-CM | POA: Diagnosis not present

## 2017-05-27 MED ORDER — FLUTICASONE PROPIONATE 50 MCG/ACT NA SUSP: NASAL | 5 refills | Status: DC

## 2017-05-27 MED ORDER — OXYCODONE-ACETAMINOPHEN 10-325 MG PO TABS: 1.0000 | ORAL_TABLET | Freq: Three times a day (TID) | ORAL | 0 refills | Status: DC | PRN

## 2017-05-27 MED ORDER — TIOTROPIUM BROMIDE MONOHYDRATE 18 MCG IN CAPS: 18.0000 ug | ORAL_CAPSULE | Freq: Every morning | RESPIRATORY_TRACT | 12 refills | Status: DC

## 2017-05-27 MED ORDER — HYDROXYZINE HCL 50 MG PO TABS: 50.0000 mg | ORAL_TABLET | Freq: Three times a day (TID) | ORAL | 0 refills | Status: DC | PRN

## 2017-05-27 NOTE — Patient Instructions (Signed)
It was nice seeing you again today.  You were seen in clinic for postmenopausal bleeding and history of COPD.  For your recent bleeding, I do not suspect this is abnormal, however I am sending you for a pelvic ultrasound to rule out other causes.   I will let you know once I have the results of this.  Additionally, for your recent increased shortness of breath, I have added a medication called Spiriva which should control your COPD symptoms better.  Please take this daily as instructed and use your albuterol inhaler only for emergency.  Please call clinic if you have any questions.  Be well, Lovenia Kim MD

## 2017-05-27 NOTE — Progress Notes (Signed)
Subjective:   Patient ID: [name]    DOB: Jul 12, 1965, 52 y.o. female   MRN: [id number]  CC: restarted menstrual cycle   HPI: [name] is a 52 y.o. female who presents to clinic today for the following issues.  ?postmenopausal bleeding Patient states she felt she was undergoing menopause however has recently started her period again on 2/26 which lasted for 5 days.  No associated cramping or headaches.  Patient reports she has been having hot flashes but has not had any bleeding in about 10 months prior to this episode.  She states that times she has some agitation and anxiety surrounding this and would like something to calm her nerves.   History of COPD Patient reports she has recently felt short of breath more often.  Reports a nighttime cough about 2-3 nights a week.  She denies leg swelling, chest pain or palpitations.  Denies dizziness or lightheadedness.  Patient is not on medication except for albuterol.  She states she uses her albuterol inhaler about 3 times daily.  History of heavy smoking and quit in 2014.   ROS: No fever, chills, nausea, vomiting.  No chest pain, shortness of breath or palpitations.  Social: Patient is a former smoker, quit 2014 Medications reviewed. Objective:   BP 122/80 (BP Location: Left Arm, Patient Position: Sitting, Cuff Size: Normal)   Pulse 76   Temp 98.7 F (37.1 C) (Oral)   Ht 5\' 8"  (1.727 m)   Wt 246 lb 9.6 oz (111.9 kg)   SpO2 94%   BMI 37.50 kg/m  Vitals and nursing note reviewed.  General: Pleasant 52 year old female, NAD CV: RRR no MRG Lungs: CTAB, normal effort Abdomen: soft, NT ND, positive bowel sounds Skin: warm, dry, no rash, brisk cap refill Extremities: warm and well perfused, normal tone Psych: Appears anxious  Assessment & Plan:   Postmenopausal bleeding Patient without menstrual cycle x 10 months, however with recent onset of menstrual bleeding again.  Discussed with patient that this is likely normal  however will treat as postmenopausal bleeding to rule out underlying conditions. -We will obtain transvaginal and complete pelvic ultrasound -Could consider endometrial biopsy for further evaluation -Rx: hydroxyzine for anxiety   Orders Placed This Encounter  Procedures  . US Transvaginal Non-OB    Standing Status:   Future    Standing Expiration Date:   07/28/2018    Order Specific Question:   Reason for Exam (SYMPTOM  OR DIAGNOSIS REQUIRED)    Answer:   post menopausal bleeding    Order Specific Question:   Preferred imaging location?    Answer:   [facility]  . US PELVIC COMPLETE WITH TRANSVAGINAL    Standing Status:   Future    Standing Expiration Date:   07/28/2018    Order Specific Question:   Reason for Exam (SYMPTOM  OR DIAGNOSIS REQUIRED)    Answer:   postmenopausal bleeding    Order Specific Question:   Preferred imaging location?    Answer:   [facility]   Meds ordered this encounter  Medications  . hydrOXYzine (ATARAX/VISTARIL) 50 MG tablet    Sig: Take 1 tablet (50 mg total) by mouth 3 (three) times daily as needed.    Dispense:  30 tablet    Refill:  0  . fluticasone (FLONASE) 50 MCG/ACT nasal spray    Sig: SPRAY TWICE IN EACH NOSTRIL DAILY    Dispense:  16 g    Refill:  5  . oxyCODONE-acetaminophen (  PERCOCET) 10-325 MG tablet    Sig: Take 1 tablet by mouth every 8 (eight) hours as needed for pain.    Dispense:  84 tablet    Refill:  0  . tiotropium (SPIRIVA HANDIHALER) 18 MCG inhalation capsule    Sig: Place 1 capsule (18 mcg total) into inhaler and inhale every morning.    Dispense:  30 capsule    Refill:  12    Lovenia Kim, MD [place], PGY-2 05/29/2017 2:18 PM

## 2017-05-29 DIAGNOSIS — N950 Postmenopausal bleeding: Secondary | ICD-10-CM | POA: Insufficient documentation

## 2017-05-29 NOTE — Assessment & Plan Note (Addendum)
Patient without menstrual cycle x 10 months, however with recent onset of menstrual bleeding again.  Discussed with patient that this is likely normal however will treat as postmenopausal bleeding to rule out underlying conditions. -We will obtain transvaginal and complete pelvic ultrasound -Could consider endometrial biopsy for further evaluation -Rx: hydroxyzine for anxiety

## 2017-06-01 ENCOUNTER — Ambulatory Visit (HOSPITAL_COMMUNITY): Payer: Medicaid Other

## 2017-06-06 ENCOUNTER — Other Ambulatory Visit (HOSPITAL_COMMUNITY): Payer: Medicaid Other

## 2017-06-13 ENCOUNTER — Ambulatory Visit (HOSPITAL_COMMUNITY): Admission: RE | Admit: 2017-06-13 | Payer: Medicaid Other | Source: Ambulatory Visit

## 2017-06-20 ENCOUNTER — Ambulatory Visit: Payer: Medicaid Other | Admitting: Family Medicine

## 2017-06-20 ENCOUNTER — Encounter: Payer: Self-pay | Admitting: Family Medicine

## 2017-06-20 ENCOUNTER — Other Ambulatory Visit: Payer: Self-pay

## 2017-06-20 ENCOUNTER — Ambulatory Visit (INDEPENDENT_AMBULATORY_CARE_PROVIDER_SITE_OTHER): Payer: Medicaid Other | Admitting: Family Medicine

## 2017-06-20 DIAGNOSIS — E038 Other specified hypothyroidism: Secondary | ICD-10-CM | POA: Diagnosis not present

## 2017-06-20 DIAGNOSIS — E890 Postprocedural hypothyroidism: Secondary | ICD-10-CM | POA: Diagnosis not present

## 2017-06-20 DIAGNOSIS — I890 Lymphedema, not elsewhere classified: Secondary | ICD-10-CM | POA: Diagnosis not present

## 2017-06-20 MED ORDER — HYDROXYZINE HCL 50 MG PO TABS: 50.0000 mg | ORAL_TABLET | Freq: Three times a day (TID) | ORAL | 0 refills | Status: DC | PRN

## 2017-06-20 MED ORDER — LEVOTHYROXINE SODIUM 175 MCG PO TABS: 175.0000 ug | ORAL_TABLET | Freq: Every day | ORAL | 3 refills | Status: DC

## 2017-06-20 MED ORDER — OMEPRAZOLE 40 MG PO CPDR: 40.0000 mg | DELAYED_RELEASE_CAPSULE | Freq: Two times a day (BID) | ORAL | 3 refills | Status: DC

## 2017-06-20 MED ORDER — DOXYCYCLINE HYCLATE 100 MG PO TABS: 100.0000 mg | ORAL_TABLET | Freq: Two times a day (BID) | ORAL | 0 refills | Status: DC

## 2017-06-20 MED ORDER — PRAVASTATIN SODIUM 40 MG PO TABS: 40.0000 mg | ORAL_TABLET | Freq: Every day | ORAL | 3 refills | Status: DC

## 2017-06-20 MED ORDER — OXYCODONE-ACETAMINOPHEN 10-325 MG PO TABS: 1.0000 | ORAL_TABLET | Freq: Three times a day (TID) | ORAL | 0 refills | Status: DC | PRN

## 2017-06-20 NOTE — Patient Instructions (Signed)
It was nice seeing you again today!  You were seen in clinic for medication refills.  I have sent these into your pharmacy and you can pick them up today. We discussed your chronic cough which is most likely due to reflux.  You are already taking Prilosec which is a good medication for this.  Avoidance of hot and spicy foods and alcohol can help with this condition as well.  Below I am including some information on reflux.  If you have any new or worsening symptoms, please call clinic to make an appointment.  Be well, Lovenia Kim MD    Gastroesophageal Reflux Disease, Adult Normally, food travels down the esophagus and stays in the stomach to be digested. If a person has gastroesophageal reflux disease (GERD), food and stomach acid move back up into the esophagus. When this happens, the esophagus becomes sore and swollen (inflamed). Over time, GERD can make small holes (ulcers) in the lining of the esophagus. Follow these instructions at home: Diet  Follow a diet as told by your doctor. You may need to avoid foods and drinks such as: ? Coffee and tea (with or without caffeine). ? Drinks that contain alcohol. ? Energy drinks and sports drinks. ? Carbonated drinks or sodas. ? Chocolate and cocoa. ? Peppermint and mint flavorings. ? Garlic and onions. ? Horseradish. ? Spicy and acidic foods, such as peppers, chili powder, curry powder, vinegar, hot sauces, and BBQ sauce. ? Citrus fruit juices and citrus fruits, such as oranges, lemons, and limes. ? Tomato-based foods, such as red sauce, chili, salsa, and pizza with red sauce. ? Fried and fatty foods, such as donuts, french fries, potato chips, and high-fat dressings. ? High-fat meats, such as hot dogs, rib eye steak, sausage, ham, and bacon. ? High-fat dairy items, such as whole milk, butter, and cream cheese.  Eat small meals often. Avoid eating large meals.  Avoid drinking large amounts of liquid with your meals.  Avoid eating meals  during the 2-3 hours before bedtime.  Avoid lying down right after you eat.  Do not exercise right after you eat. General instructions  Pay attention to any changes in your symptoms.  Take over-the-counter and prescription medicines only as told by your doctor. Do not take aspirin, ibuprofen, or other NSAIDs unless your doctor says it is okay.  Do not use any tobacco products, including cigarettes, chewing tobacco, and e-cigarettes. If you need help quitting, ask your doctor.  Wear loose clothes. Do not wear anything tight around your waist.  Raise (elevate) the head of your bed about 6 inches (15 cm).  Try to lower your stress. If you need help doing this, ask your doctor.  If you are overweight, lose an amount of weight that is healthy for you. Ask your doctor about a safe weight loss goal.  Keep all follow-up visits as told by your doctor. This is important. Contact a doctor if:  You have new symptoms.  You lose weight and you do not know why it is happening.  You have trouble swallowing, or it hurts to swallow.  You have wheezing or a cough that keeps happening.  Your symptoms do not get better with treatment.  You have a hoarse voice. Get help right away if:  You have pain in your arms, neck, jaw, teeth, or back.  You feel sweaty, dizzy, or light-headed.  You have chest pain or shortness of breath.  You throw up (vomit) and your throw up looks like blood  or coffee grounds.  You pass out (faint).  Your poop (stool) is bloody or black.  You cannot swallow, drink, or eat. This information is not intended to replace advice given to you by your health care provider. Make sure you discuss any questions you have with your health care provider. Document Released: 08/11/2007 Document Revised: 07/31/2015 Document Reviewed: 06/19/2014 Elsevier Interactive Patient Education  [employer].

## 2017-06-20 NOTE — Progress Notes (Signed)
   Subjective:   Patient ID: CHANTA BAUERS    DOB: Jan 15, 1966, 52 y.o. female   MRN: [id number]  CC: chronic cough, medication refill   HPI: AUDREYANA HUNTSBERRY is a 52 y.o. female who presents to clinic today for medication refill.    Pt feeling well with no concerns today.  She requests refill of her chronic medications - Pravastatin, Prilosec, Synthroid, Percocet and hydroxyzine.     Hypothyroidism Symptoms well controlled. Denies dry skin, weight gain, cold intolerance.  Last TSH 04/29/17 within normal limit.     Chronic Lymphedema  Stable with no new worsening.  Has been on Percocet for pain control. Have advised her to use compression stockings.  Denies CP, SOB, palpitations.     ROS: No fever, chills, nausea, vomiting.  No CP, SOB, palpitations.    Social: pt is a former smoker, quit 2014  Medications reviewed. Objective:   BP 138/82   Pulse 68   Temp 98.8 F (37.1 C) (Oral)   Ht 5\' 8"  (1.727 m)   Wt 247 lb 6.4 oz (112.2 kg)   SpO2 98%   BMI 37.62 kg/m  Vitals and nursing note reviewed.   General: 52 yo female, NAD  HEENT: EOMI, PERRL  Neck: supple, no LAD  CV: RRR no MRG   Lungs: CTAB, normal effort  Abdomen: soft, NTND, +bs  Skin: warm, dry, no rash Extremities: warm and well perfused, normal tone   Assessment & Plan:   Hypothyroidism Symptoms well controlled. Denies dry skin, weight gain, cold intolerance.  Last TSH 04/29/17 within normal limit.  -Refill synthroid   Lymphedema Refill percocet -recommend use of compression stockings   Meds ordered this encounter  Medications  . pravastatin (PRAVACHOL) 40 MG tablet    Sig: Take 1 tablet (40 mg total) by mouth daily.    Dispense:  90 tablet    Refill:  3  . omeprazole (PRILOSEC) 40 MG capsule    Sig: Take 1 capsule (40 mg total) by mouth 2 (two) times daily.    Dispense:  60 capsule    Refill:  3  . levothyroxine (SYNTHROID, LEVOTHROID) 175 MCG tablet    Sig: Take 1 tablet (175 mcg total) by mouth  daily.    Dispense:  90 tablet    Refill:  3  . oxyCODONE-acetaminophen (PERCOCET) 10-325 MG tablet    Sig: Take 1 tablet by mouth every 8 (eight) hours as needed for pain.    Dispense:  84 tablet    Refill:  0  . hydrOXYzine (ATARAX/VISTARIL) 50 MG tablet    Sig: Take 1 tablet (50 mg total) by mouth 3 (three) times daily as needed.    Dispense:  30 tablet    Refill:  0  . doxycycline (VIBRA-TABS) 100 MG tablet    Sig: Take 1 tablet (100 mg total) by mouth 2 (two) times daily.    Dispense:  20 tablet    Refill:  0   Follow up: 1 month   Lovenia Kim, MD [place], PGY-2 06/27/2017 12:07 PM

## 2017-06-22 ENCOUNTER — Ambulatory Visit: Payer: Medicaid Other

## 2017-06-27 NOTE — Assessment & Plan Note (Signed)
Symptoms well controlled. Denies dry skin, weight gain, cold intolerance.  Last TSH 04/29/17 within normal limit.  -Refill synthroid

## 2017-06-27 NOTE — Assessment & Plan Note (Signed)
Refill percocet -recommend use of compression stockings

## 2017-07-13 ENCOUNTER — Ambulatory Visit (INDEPENDENT_AMBULATORY_CARE_PROVIDER_SITE_OTHER): Payer: Medicaid Other | Admitting: Family Medicine

## 2017-07-13 ENCOUNTER — Encounter: Payer: Self-pay | Admitting: Family Medicine

## 2017-07-13 ENCOUNTER — Other Ambulatory Visit: Payer: Self-pay

## 2017-07-13 ENCOUNTER — Ambulatory Visit: Payer: Medicaid Other | Admitting: Family Medicine

## 2017-07-13 VITALS — BP 108/62 | HR 71 | Temp 98.2°F | Ht 68.0 in | Wt 250.2 lb

## 2017-07-13 DIAGNOSIS — L0291 Cutaneous abscess, unspecified: Secondary | ICD-10-CM

## 2017-07-13 MED ORDER — METRONIDAZOLE 500 MG PO TABS: 500.0000 mg | ORAL_TABLET | Freq: Two times a day (BID) | ORAL | 0 refills | Status: DC

## 2017-07-13 NOTE — Patient Instructions (Signed)
Skin Abscess A skin abscess is an infected area on or under your skin that contains pus and other material. An abscess can happen almost anywhere on your body. Some abscesses break open (rupture) on their own. Most continue to get worse unless they are treated. The infection can spread deeper into the body and into your blood, which can make you feel sick. Treatment usually involves draining the abscess. Follow these instructions at home: Abscess Care  If you have an abscess that has not drained, place a warm, clean, wet washcloth over the abscess several times a day. Do this as told by your doctor.  Follow instructions from your doctor about how to take care of your abscess. Make sure you: ? Cover the abscess with a bandage (dressing). ? Change your bandage or gauze as told by your doctor. ? Wash your hands with soap and water before you change the bandage or gauze. If you cannot use soap and water, use hand sanitizer.  Check your abscess every day for signs that the infection is getting worse. Check for: ? More redness, swelling, or pain. ? More fluid or blood. ? Warmth. ? More pus or a bad smell. Medicines   Take over-the-counter and prescription medicines only as told by your doctor.  If you were prescribed an antibiotic medicine, take it as told by your doctor. Do not stop taking the antibiotic even if you start to feel better. General instructions  To avoid spreading the infection: ? Do not share personal care items, towels, or hot tubs with others. ? Avoid making skin-to-skin contact with other people.  Keep all follow-up visits as told by your doctor. This is important. Contact a doctor if:  You have more redness, swelling, or pain around your abscess.  You have more fluid or blood coming from your abscess.  Your abscess feels warm when you touch it.  You have more pus or a bad smell coming from your abscess.  You have a fever.  Your muscles ache.  You have  chills.  You feel sick. Get help right away if:  You have very bad (severe) pain.  You see red streaks on your skin spreading away from the abscess. This information is not intended to replace advice given to you by your health care provider. Make sure you discuss any questions you have with your health care provider. Document Released: 08/11/2007 Document Revised: 10/19/2015 Document Reviewed: 01/01/2015 Elsevier Interactive Patient Education  2018 Elsevier Inc.  

## 2017-07-13 NOTE — Progress Notes (Signed)
Subjective:    Patient ID: [name] , female   DOB: 08/30/1965 , 52 y.o..   MRN: [id number]  HPI  [name] is here for   1. Abscess: Patient has a history of hidradenitis.  She notes that she is chronically on doxycycline for this.  She notes that she recently developed a abscess in her right groin.  The abscess started about 4 days ago and has just become progressively larger and more painful.  She has not had any fever, chills, nausea, vomiting.  She has not noticed any draining from the abscess site.  Usually she notes that she drinks abscesses herself but this 1 was too large and too painful.  Review of Systems: Per HPI.   Past Medical History: Patient Active Problem List   Diagnosis Date Noted  . Postmenopausal bleeding 05/29/2017  . Numbness and tingling in left hand 03/25/2017  . Right hip pain 03/25/2017  . Eye discharge 02/27/2017  . Post-viral cough syndrome 02/27/2017  . Perimenopausal symptom 11/26/2016  . Vaginal dryness 10/29/2016  . Restless legs 02/23/2016  . Acute back pain 07/02/2015  . Polyuria 02/10/2015  . Fatigue 02/10/2015  . 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 History of colonic polyps 06/06/2014  . Chronic RLQ pain 06/06/2014  . Nausea with vomiting 06/06/2014  . Rash and nonspecific skin eruption 12/31/2013  . 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 Ingrown right big toenail 07/02/2013  . Inguinal abscess 07/02/2013  . Lateral epicondylitis 08/09/2012  . Chronic abdominal pain 04/04/2012  . Numbness and tingling of both legs 01/26/2012  . Premenstrual syndrome 09/20/2011  . Urinary frequency 09/15/2011  . GERD (gastroesophageal reflux disease) 06/24/2011  . Dyspepsia 06/24/2011  . Abdominal pain 05/26/2011  . Preventative health care 01/22/2011  . Obesity 01/22/2011  . Pelvic pain in female 12/29/2010  . History of irregular menstrual bleeding 04/11/2010  . Anxiety and depression 10/24/2009  .  COPD (chronic obstructive pulmonary disease) ([place]) 05/09/2009  . Hyperlipidemia 01/05/2008  . Lymphedema 01/04/2007  . HIDRADENITIS SUPPURATIVA 08/17/2006  . Hypothyroidism 05/05/2006  . History of tobacco use 05/05/2006  . Episodic mood disorder ([place]) 05/05/2006  . Lumbar disc herniation 05/05/2006    Medications: reviewed   Social Hx:  reports that she quit smoking about 5 years ago. Her smoking use included cigarettes. She has a 12.50 pack-year smoking history. She has never used smokeless tobacco.   Objective:   BP 108/62   Pulse 71   Temp 98.2 F (36.8 C) (Oral)   Ht 5\' 8"  (1.727 m)   Wt 250 lb 3.2 oz (113.5 kg)   LMP 07/09/2017 (Exact Date)   SpO2 98%   BMI 38.04 kg/m  Physical Exam  Gen: NAD, alert, cooperative with exam, well-appearing Skin: Right groin ~3 inch area of erythema and induration with central fluctuance  Incision and Drainage Procedure Note:  The affected area was cleaned and draped in a sterile fashion. Anesthesia was achieved using 4 mL of 1% Lidocaine without epinephrine injected around the wound area using a 25-guage 1.5 inch needle. An 11-blade scalpel was used to incise the wound.  Copious amount of bloody purulent foul-smelling discharge was drained.  A culture was obtained. A sterile dressing was applied to the area. The patient tolerated the procedure well. No complications were encountered.    Assessment & Plan:   1. Abscess: Abscess of right groin, I&D today.  Copious amount of bloody purulent foul-smelling discharge was drained.  No signs of systemic infection. - metroNIDAZOLE (FLAGYL) 500 MG tablet; Take 1 tablet (500 mg total) by mouth 2 (two) times daily.  Dispense: 14 tablet; Refill: 0 to cover for anaerobic bacteria -Continue regular doxycycline as she has been taking - Body fluid culture -Return precautions discussed   Smitty Cords, MD [place], PGY-3

## 2017-07-18 LAB — AEROBIC CULTURE

## 2017-07-18 LAB — SPECIMEN STATUS REPORT

## 2017-07-19 ENCOUNTER — Telehealth: Payer: Self-pay | Admitting: Family Medicine

## 2017-07-19 NOTE — Telephone Encounter (Signed)
Called patient to see how she was doing since we drained her abscess.  No answer.  Left voicemail  Smitty Cords, MD [place], PGY-3

## 2017-07-22 ENCOUNTER — Other Ambulatory Visit: Payer: Self-pay

## 2017-07-22 ENCOUNTER — Encounter: Payer: Self-pay | Admitting: Family Medicine

## 2017-07-22 ENCOUNTER — Ambulatory Visit (INDEPENDENT_AMBULATORY_CARE_PROVIDER_SITE_OTHER): Payer: Medicaid Other | Admitting: Family Medicine

## 2017-07-22 DIAGNOSIS — M5126 Other intervertebral disc displacement, lumbar region: Secondary | ICD-10-CM

## 2017-07-22 DIAGNOSIS — I890 Lymphedema, not elsewhere classified: Secondary | ICD-10-CM

## 2017-07-22 DIAGNOSIS — Z87891 Personal history of nicotine dependence: Secondary | ICD-10-CM

## 2017-07-22 DIAGNOSIS — L0291 Cutaneous abscess, unspecified: Secondary | ICD-10-CM

## 2017-07-22 MED ORDER — VARENICLINE TARTRATE 0.5 MG PO TABS: 0.5000 mg | ORAL_TABLET | Freq: Two times a day (BID) | ORAL | 3 refills | Status: DC

## 2017-07-22 MED ORDER — PREGABALIN 150 MG PO CAPS: 150.0000 mg | ORAL_CAPSULE | Freq: Two times a day (BID) | ORAL | 4 refills | Status: DC

## 2017-07-22 MED ORDER — OXYCODONE-ACETAMINOPHEN 10-325 MG PO TABS: 1.0000 | ORAL_TABLET | Freq: Three times a day (TID) | ORAL | 0 refills | Status: DC | PRN

## 2017-07-22 MED ORDER — METRONIDAZOLE 500 MG PO TABS: 500.0000 mg | ORAL_TABLET | Freq: Two times a day (BID) | ORAL | 0 refills | Status: DC

## 2017-07-22 MED ORDER — CLOBETASOL PROPIONATE 0.05 % EX CREA: TOPICAL_CREAM | Freq: Two times a day (BID) | CUTANEOUS | 6 refills | Status: DC

## 2017-07-22 MED ORDER — QUETIAPINE FUMARATE 50 MG PO TABS: 50.0000 mg | ORAL_TABLET | Freq: Every day | ORAL | 3 refills | Status: DC

## 2017-07-22 NOTE — Patient Instructions (Addendum)
It was nice seeing you again today.  You were seen in clinic for patches for nicotine.  We discussed smoking cessation and decided to start Chantix for you today.  You can take this twice a day after eating with a full glass of water.  Below I have included some instructions for how to take this medication and since common side effects.  Another good resource is 1-800 QUIT NOW.  Please call clinic if you have any questions.  Additionally, I refilled your antibiotic and chronic pain medications.    Be well, Lovenia Kim, MD    Varenicline oral tablets  What is this medicine? VARENICLINE (var EN i kleen) is used to help people quit smoking. It can reduce the symptoms caused by stopping smoking. It is used with a patient support program recommended by your physician. This medicine may be used for other purposes; ask your health care provider or pharmacist if you have questions. COMMON BRAND NAME(S): Chantix What should I tell my health care provider before I take this medicine? They need to know if you have any of these conditions: -bipolar disorder, depression, schizophrenia or other mental illness -heart disease -if you often drink alcohol -kidney disease -peripheral vascular disease -seizures -stroke -suicidal thoughts, plans, or attempt; a previous suicide attempt by you or a family member -an unusual or allergic reaction to varenicline, other medicines, foods, dyes, or preservatives -pregnant or trying to get pregnant -breast-feeding How should I use this medicine? Take this medicine by mouth after eating. Take with a full glass of water. Follow the directions on the prescription label. Take your doses at regular intervals. Do not take your medicine more often than directed. There are 3 ways you can use this medicine to help you quit smoking; talk to your health care professional to decide which plan is right for you: 1) you can choose a quit date and start this medicine 1 week before  the quit date, or, 2) you can start taking this medicine before you choose a quit date, and then pick a quit date between day 8 and 35 days of treatment, or, 3) if you are not sure that you are able or willing to quit smoking right away, start taking this medicine and slowly decrease the amount you smoke as directed by your health care professional with the goal of being cigarette-free by week 12 of treatment. Stick to your plan; ask about support groups or other ways to help you remain cigarette-free. If you are motivated to quit smoking and did not succeed during a previous attempt with this medicine for reasons other than side effects, or if you returned to smoking after this treatment, speak with your health care professional about whether another course of this medicine may be right for you. A special MedGuide will be given to you by the pharmacist with each prescription and refill. Be sure to read this information carefully each time. Talk to your pediatrician regarding the use of this medicine in children. This medicine is not approved for use in children. Overdosage: If you think you have taken too much of this medicine contact a poison control center or emergency room at once. NOTE: This medicine is only for you. Do not share this medicine with others. What if I miss a dose? If you miss a dose, take it as soon as you can. If it is almost time for your next dose, take only that dose. Do not take double or extra doses. What may interact  with this medicine? -alcohol or any product that contains alcohol -insulin -other stop smoking aids -theophylline -warfarin This list may not describe all possible interactions. Give your health care provider a list of all the medicines, herbs, non-prescription drugs, or dietary supplements you use. Also tell them if you smoke, drink alcohol, or use illegal drugs. Some items may interact with your medicine. What should I watch for while using this  medicine? Visit your doctor or health care professional for regular check ups. Ask for ongoing advice and encouragement from your doctor or healthcare professional, friends, and family to help you quit. If you smoke while on this medication, quit again Your mouth may get dry. Chewing sugarless gum or sucking hard candy, and drinking plenty of water may help. Contact your doctor if the problem does not go away or is severe. You may get drowsy or dizzy. Do not drive, use machinery, or do anything that needs mental alertness until you know how this medicine affects you. Do not stand or sit up quickly, especially if you are an older patient. This reduces the risk of dizzy or fainting spells. Sleepwalking can happen during treatment with this medicine, and can sometimes lead to behavior that is harmful to you, other people, or property. Stop taking this medicine and tell your doctor if you start sleepwalking or have other unusual sleep-related activity. Decrease the amount of alcoholic beverages that you drink during treatment with this medicine until you know if this medicine affects your ability to tolerate alcohol. Some people have experienced increased drunkenness (intoxication), unusual or sometimes aggressive behavior, or no memory of things that have happened (amnesia) during treatment with this medicine. The use of this medicine may increase the chance of suicidal thoughts or actions. Pay special attention to how you are responding while on this medicine. Any worsening of mood, or thoughts of suicide or dying should be reported to your health care professional right away. What side effects may I notice from receiving this medicine? Side effects that you should report to your doctor or health care professional as soon as possible: -allergic reactions like skin rash, itching or hives, swelling of the face, lips, tongue, or throat -acting aggressive, being angry or violent, or acting on dangerous  impulses -breathing problems -changes in vision -chest pain or chest tightness -confusion, trouble speaking or understanding -new or worsening depression, anxiety, or panic attacks -extreme increase in activity and talking (mania) -fast, irregular heartbeat -feeling faint or lightheaded, falls -fever -pain in legs when walking -problems with balance, talking, walking -redness, blistering, peeling or loosening of the skin, including inside the mouth -ringing in ears -seeing or hearing things that aren't there (hallucinations) -seizures -sleepwalking -sudden numbness or weakness of the face, arm or leg -thoughts about suicide or dying, or attempts to commit suicide -trouble passing urine or change in the amount of urine -unusual bleeding or bruising -unusually weak or tired Side effects that usually do not require medical attention (report to your doctor or health care professional if they continue or are bothersome): -constipation -headache -nausea, vomiting -strange dreams -stomach gas -trouble sleeping This list may not describe all possible side effects. Call your doctor for medical advice about side effects. You may report side effects to FDA at 1-800-FDA-1088. Where should I keep my medicine? Keep out of the reach of children. Store at room temperature between 15 and 30 degrees C (59 and 86 degrees F). Throw away any unused medicine after the expiration date. NOTE: This sheet is  a summary. It may not cover all possible information. If you have questions about this medicine, talk to your doctor, pharmacist, or health care provider.  2018 Elsevier/Gold Standard (2014-11-07 16:14:23)

## 2017-07-22 NOTE — Progress Notes (Signed)
Subjective:   Patient ID: [name]    DOB: [birth year], 52 y.o. female   MRN: [id number]  CC: refill meds, nicotine patch    HPI: [name] is a 52 y.o. female who presents to clinic today for medication refills.    Smoking cessation Patient states November 2016 is the anniversary of her mother's death.  She currently smokes 1-2 cigarettes a day but would like to stop.  She feels she smokes every year leading up to the anniversary due to anxiety/stress.  She requests a nicotine patch or another method to help with this.   She only wants nicotine patches if they are covered by Medicaid.  No other concerns this visit. She has quit in the past using Chantix and was successful.    ROS: No fever, chills, nausea, vomiting.  No cough, SOB, CP, palpitations.   Social: pt is a current daily smoker  Medications reviewed. Objective:   BP 132/82   Pulse 71   Temp 98.3 F (36.8 C) (Oral)   Wt 245 lb 12.8 oz (111.5 kg)   LMP 07/09/2017 (Exact Date)   SpO2 99%   BMI 37.37 kg/m  Vitals and nursing note reviewed.  General: 52 yo female, NAD  HEENT: NCAT, EOMI, MMM  Neck: supple, no LAD  CV: RRR no MRG  Lungs: CTAB, normal effort  Skin: warm, dry, no rash  Extremities: warm and well perfused   Assessment & Plan:   History of tobacco use Patient has started smoking again due to remembering her mother's passing in 2016 which adds emotional stress.  She is interested in smoking cessation at today's visit.  Congratulated patient on her willingness to quit.  Reviewed reasons this would benefit her health and pt identifies several positives. She requests Nicotine 7 mg patch but only if covered by Medicaid.  [place] Medicaid preferred drug list reviewed, Chantix is preferred and she is open to trying this again.  -Rx: chantix -advised pt to take after eating with full 8 oz glass of water -1800 QUIT NOW provided for additional resources and support   Meds ordered this encounter  Medications   . varenicline (CHANTIX) 0.5 MG tablet    Sig: Take 1 tablet (0.5 mg total) by mouth 2 (two) times daily.    Dispense:  60 tablet    Refill:  3  . oxyCODONE-acetaminophen (PERCOCET) 10-325 MG tablet    Sig: Take 1 tablet by mouth every 8 (eight) hours as needed for pain.    Dispense:  84 tablet    Refill:  0  . pregabalin (LYRICA) 150 MG capsule    Sig: Take 1 capsule (150 mg total) by mouth 2 (two) times daily.    Dispense:  180 capsule    Refill:  4  . clobetasol cream (TEMOVATE) 0.05 %    Sig: Apply topically 2 (two) times daily.    Dispense:  60 g    Refill:  6  . QUEtiapine (SEROQUEL) 50 MG tablet    Sig: Take 1 tablet (50 mg total) by mouth at bedtime.    Dispense:  30 tablet    Refill:  3  . metroNIDAZOLE (FLAGYL) 500 MG tablet    Sig: Take 1 tablet (500 mg total) by mouth 2 (two) times daily.    Dispense:  14 tablet    Refill:  0   Follow up: 1 mo or sooner if needed   Lovenia Kim, MD [place], PGY-2 07/27/2017 4:48  PM

## 2017-07-26 ENCOUNTER — Telehealth: Payer: Self-pay

## 2017-07-26 NOTE — Telephone Encounter (Signed)
Completed PA info in [place] [employer] for oxycodone.  Status pending.  Will recheck status in 24 hours. Chistine Dematteo B Fay Bagg, RN  

## 2017-07-27 NOTE — Telephone Encounter (Signed)
Pt called asking if this prior authorization has been done yet. Please call pt and let her know if it has or not.

## 2017-07-27 NOTE — Telephone Encounter (Signed)
I did not see anything in my box regarding this yet,  I believe Ailene Ravel completed it and is  pending.

## 2017-07-27 NOTE — Assessment & Plan Note (Signed)
Patient has started smoking again due to remembering her mother's passing in 2016 which adds emotional stress.  She is interested in smoking cessation at today's visit.  Congratulated patient on her willingness to quit.  Reviewed reasons this would benefit her health and pt identifies several positives. She requests Nicotine 7 mg patch but only if covered by Medicaid.  [place] Medicaid preferred drug list reviewed, Chantix is preferred and she is open to trying this again.  -Rx: chantix -advised pt to take after eating with full 8 oz glass of water -1800 QUIT NOW provided for additional resources and support

## 2017-07-28 NOTE — Telephone Encounter (Signed)
Prior approval for oxycodone completed via [facility] [employer].  Med approved for 07/27/17 - 08/26/17  Prior approval # [id number] .  [facility] pharmacy informed.  Wallace Cullens, RN

## 2017-08-02 ENCOUNTER — Ambulatory Visit: Payer: Medicaid Other | Admitting: Internal Medicine

## 2017-08-02 ENCOUNTER — Encounter: Payer: Self-pay | Admitting: Internal Medicine

## 2017-08-02 VITALS — BP 114/80 | HR 76 | Ht 68.0 in | Wt 250.4 lb

## 2017-08-02 DIAGNOSIS — R140 Abdominal distension (gaseous): Secondary | ICD-10-CM | POA: Diagnosis not present

## 2017-08-02 DIAGNOSIS — Z860100 Personal history of colon polyps, unspecified: Secondary | ICD-10-CM

## 2017-08-02 DIAGNOSIS — K219 Gastro-esophageal reflux disease without esophagitis: Secondary | ICD-10-CM | POA: Diagnosis not present

## 2017-08-02 MED ORDER — PANTOPRAZOLE SODIUM 40 MG PO TBEC: 40.0000 mg | DELAYED_RELEASE_TABLET | Freq: Two times a day (BID) | ORAL | 10 refills | Status: DC

## 2017-08-02 NOTE — Patient Instructions (Signed)
We have sent the following medications to your pharmacy for you to pick up at your convenience: Pantoprazole 40 mg twice daily before meals (in place of omeprazole)  Discontinue omeprazole.  We have given you samples of Align. This puts good bacteria back into your colon. You should take 1 capsule by mouth once daily. If this works well for you, it can be purchased over the counter.  Follow up as needed (every 1-2 years).  If you are age 52 or older, your body mass index should be between 23-30. Your Body mass index is 38.07 kg/m. If this is out of the aforementioned range listed, please consider follow up with your Primary Care Provider.  If you are age 66 or younger, your body mass index should be between 19-25. Your Body mass index is 38.07 kg/m. If this is out of the aformentioned range listed, please consider follow up with your Primary Care Provider.

## 2017-08-02 NOTE — Progress Notes (Signed)
Subjective:    Patient ID: [name], female    DOB: 26-Sep-1965, 52 y.o.   MRN: [id number]  HPI Leyana Whidden is a 52 year old female with a history of GERD, chronic right sided abdominal pain, history of sessile serrated colon polyps who is here for follow-up.  She also has a history of hidradenitis, hyperlipidemia, hypothyroidism, uterine fibroids and tobacco use.  She reports most recently she has had scratchy throat with intermittent cough and hoarseness.  She admits to smoking earlier this month.  She was prescribed Chantix by primary care but lost the prescription.  She is not smoking often but it has been a recurrent issue for her.  Her heartburn for the most part is controlled, but she admits to feeling upper abdominal discomfort and dyspeptic symptoms.  She is using omeprazole twice a day and even at x3 times a day.  She does feel abdominal bloating and is using magnesium citrate about every 1 to 2 weeks to help with gas and bloating because this causes her to have diarrhea.  She denies constipation and states she has a bowel movement 1-2 times per day and is only using the magnesium citrate for bloating.  No blood in her stool or melena.  Periods have been irregular.  She is using oxycodone on a regular basis for her lymphedema pain.  Her previous right upper quadrant pain and rash have overall improved though the pain is still there on occasion.  The rash has been absent for about 5 months.  After her last visit we did a CT scan of her abdomen and pelvis which was largely unremarkable and she had a normal HIDA scan  Past Medical History:  Diagnosis Date  . Allergy   . Anxiety   . Arthritis    knees,hands, hip  . Cellulitis   . Colon polyps 2013   SESSIL SERRATED ADENOMA (ALL FRAGMENTS)  . COPD (chronic obstructive pulmonary disease) ([place])   . Depression   . Fibroadenosis breast   . GERD (gastroesophageal reflux disease)    does not take med  . Heart murmur    as child  .  Hyperlipidemia    no meds - diet controlled  . Hypothyroidism   . Lymph edema    both legs and feet  . Migraine   . Plantar fasciitis   . Syphilis Treated in early 2012 during hospitalization  . Uterine fibroid   . Varicose veins       Review of Systems As per HPI, otherwise negative  Current Medications, Allergies, Past Medical History, Past Surgical History, Family History and Social History were reviewed in [employer] record.     Objective:   Physical Exam BP 114/80   Pulse 76   Ht 5\' 8"  (1.727 m)   Wt 250 lb 6.4 oz (113.6 kg)   LMP 08/01/2017 (Approximate)   BMI 38.07 kg/m  Constitutional: Well-developed and well-nourished. No distress. HEENT: Normocephalic and atraumatic.  Conjunctivae are normal.  No scleral icterus. Neck: Neck supple. Trachea midline. Cardiovascular: Normal rate, regular rhythm and intact distal pulses. No M/R/G Pulmonary/chest: Effort normal and breath sounds normal. No wheezing, rales or rhonchi. Abdominal: Soft, nontender, nondistended. Bowel sounds active throughout. There are no masses palpable. Extremities: no clubbing, cyanosis, bilateral lymphedema Neurological: Alert and oriented to person place and time. Skin: Skin is warm and dry. Psychiatric: Normal mood and affect. Behavior is normal.  CBC    Component Value Date/Time   WBC 4.2 08/23/2016  1120   RBC 4.20 08/23/2016 1120   HGB 14.4 08/23/2016 1120   HCT 41.9 08/23/2016 1120   PLT 291.0 08/23/2016 1120   MCV 99.9 08/23/2016 1120   MCH 33.3 06/27/2015 1252   MCHC 34.3 08/23/2016 1120   RDW 13.2 08/23/2016 1120   LYMPHSABS 1.4 08/23/2016 1120   MONOABS 0.4 08/23/2016 1120   EOSABS 0.1 08/23/2016 1120   BASOSABS 0.1 08/23/2016 1120   CMP     Component Value Date/Time   NA 136 08/23/2016 1120   NA 140 07/30/2016 1447   K 4.1 08/23/2016 1120   CL 104 08/23/2016 1120   CO2 26 08/23/2016 1120   GLUCOSE 92 08/23/2016 1120   BUN 12 08/23/2016 1120    BUN 6 07/30/2016 1447   CREATININE 0.90 08/23/2016 1120   CREATININE 0.92 02/10/2015 1458   CALCIUM 9.3 08/23/2016 1120   PROT 6.9 08/23/2016 1120   ALBUMIN 4.1 08/23/2016 1120   AST 22 08/23/2016 1120   ALT 17 08/23/2016 1120   ALKPHOS 73 08/23/2016 1120   BILITOT 0.6 08/23/2016 1120   GFRNONAA 80 07/30/2016 1447   GFRNONAA 73 02/10/2015 1458   GFRAA 92 07/30/2016 1447   GFRAA 85 02/10/2015 1458   CT ABDOMEN AND PELVIS WITH CONTRAST   TECHNIQUE: Multidetector CT imaging of the abdomen and pelvis was performed using the standard protocol following bolus administration of intravenous contrast.   CONTRAST:  160mL ISOVUE-300 IOPAMIDOL (ISOVUE-300) INJECTION 61%   COMPARISON:  Right upper quadrant ultrasound 08/19/2016. Abdominopelvic CT 06/18/2014.   FINDINGS: Lower chest: Clear lung bases. No pleural or pericardial effusion. The heart size is stable.   Hepatobiliary: The liver is normal in density without focal abnormality. The gallbladder is contracted without apparent wall thickening, stones or surrounding inflammation. There is no biliary dilatation.   Pancreas: Unremarkable. No pancreatic ductal dilatation or surrounding inflammatory changes.   Spleen: Normal in size without focal abnormality.   Adrenals/Urinary Tract: Stable minimal right adrenal nodularity. The left adrenal gland appears normal. Extrarenal pelves are present bilaterally. No evidence of renal mass, urinary tract calculus or hydronephrosis. The bladder appears normal.   Stomach/Bowel: No evidence of bowel wall thickening, distention or surrounding inflammatory change. The appendix appears normal. There is mildly prominent stool in the rectum.   Vascular/Lymphatic: There are no enlarged abdominal or pelvic lymph nodes. Minimal aortoiliac atherosclerosis.   Reproductive: Mild uterine heterogeneity and lobularity suspicious for small fibroids. 2 cm collapsing left ovarian follicle on image 71. No  suspicious adnexal findings or pelvic inflammation.   Other: Small umbilical hernia containing only fat.  No ascites.   Musculoskeletal: No acute or significant osseous findings. Mild degenerative changes in the lower lumbar spine and sacroiliac joints.   IMPRESSION: 1. No acute findings or explanation for the patient's symptoms. The gallbladder is contracted, but appeared normal on recent ultrasound. 2. Suspected small uterine fibroids.     Electronically Signed   By: Richardean Sale [name]   On: 08/27/2016 08:35    EXAM: ULTRASOUND ABDOMEN LIMITED RIGHT UPPER QUADRANT   COMPARISON:  None.   FINDINGS: Gallbladder:   No gallstones or wall thickening visualized. No sonographic Murphy sign noted by sonographer.   Common bile duct:   Diameter: 4.1 mm   Liver:   No focal lesion identified. Within normal limits in parenchymal echogenicity.   IMPRESSION: Normal gallbladder.  Normal right upper quadrant ultrasound.     Electronically Signed   By: Abelardo Diesel [name]   On: 08/19/2016  13:03       Assessment & Plan:  52 year old female with a history of GERD, chronic right sided abdominal pain, history of sessile serrated colon polyps who is here for follow-up.  1. GERD/LPR --it is possible that some of her hoarseness, coughing and throat clearing is related to uncontrolled reflux.  We discussed how smoking is definitely contributing to her hoarseness and coughing.  I encouraged her to contact primary care to discuss another prescription for Chantix given that she lost her recent prescription.  I will change her omeprazole to pantoprazole 40 mg twice daily AC to see if this helps better with her LPR type symptoms.  No dysphagia to warrant repeat endoscopy.  2.  Abdominal bloating --she denies constipation and we discussed how magnesium citrate is really a laxative.  With her chronic opioid use she may have an element of gastroparesis but also altered bowel function.  I  recommended she try Align 1 capsule daily for gas and bloating.  3.  History of SSP --surveillance colonoscopy in 2021  Office follow-up as needed 25 minutes spent with the patient today. Greater than 50% was spent in counseling and coordination of care with the patient

## 2017-08-03 ENCOUNTER — Other Ambulatory Visit: Payer: Self-pay | Admitting: Family Medicine

## 2017-08-03 NOTE — Telephone Encounter (Signed)
Pt called requesting a refill on her nausea medication be sent to [facility] on [address]. Pt also asked if she could get another Rx for her Chantix. She said she lost the medication and she hasn't been able to take one pill. Please call pt when this has been done. Please advise

## 2017-08-04 ENCOUNTER — Other Ambulatory Visit: Payer: Self-pay | Admitting: Family Medicine

## 2017-08-04 MED ORDER — ONDANSETRON HCL 4 MG PO TABS: 4.0000 mg | ORAL_TABLET | Freq: Three times a day (TID) | ORAL | 0 refills | Status: DC | PRN

## 2017-08-04 MED ORDER — VARENICLINE TARTRATE 0.5 MG PO TABS: 0.5000 mg | ORAL_TABLET | Freq: Two times a day (BID) | ORAL | 3 refills | Status: DC

## 2017-08-04 NOTE — Telephone Encounter (Signed)
Pt informed. Cledith Kamiya, CMA  

## 2017-08-04 NOTE — Telephone Encounter (Signed)
Have resent both to preferred pharmacy. Please inform pt, thanks

## 2017-08-10 ENCOUNTER — Other Ambulatory Visit: Payer: Self-pay

## 2017-08-10 DIAGNOSIS — L0291 Cutaneous abscess, unspecified: Secondary | ICD-10-CM

## 2017-08-11 MED ORDER — METRONIDAZOLE 500 MG PO TABS: 500.0000 mg | ORAL_TABLET | Freq: Two times a day (BID) | ORAL | 0 refills | Status: DC

## 2017-08-22 ENCOUNTER — Ambulatory Visit (INDEPENDENT_AMBULATORY_CARE_PROVIDER_SITE_OTHER): Payer: Medicaid Other | Admitting: Family Medicine

## 2017-08-22 ENCOUNTER — Encounter: Payer: Self-pay | Admitting: Family Medicine

## 2017-08-22 VITALS — BP 115/80 | HR 78 | Temp 98.5°F | Wt 244.6 lb

## 2017-08-22 DIAGNOSIS — I890 Lymphedema, not elsewhere classified: Secondary | ICD-10-CM | POA: Diagnosis not present

## 2017-08-22 DIAGNOSIS — L732 Hidradenitis suppurativa: Secondary | ICD-10-CM | POA: Diagnosis not present

## 2017-08-22 MED ORDER — ALBUTEROL SULFATE HFA 108 (90 BASE) MCG/ACT IN AERS: INHALATION_SPRAY | RESPIRATORY_TRACT | 5 refills | Status: DC

## 2017-08-22 MED ORDER — MELOXICAM 7.5 MG PO TABS: 7.5000 mg | ORAL_TABLET | Freq: Every day | ORAL | 2 refills | Status: DC

## 2017-08-22 MED ORDER — OXYCODONE-ACETAMINOPHEN 10-325 MG PO TABS: 1.0000 | ORAL_TABLET | Freq: Three times a day (TID) | ORAL | 0 refills | Status: DC | PRN

## 2017-08-22 NOTE — Assessment & Plan Note (Addendum)
Chronic, stable. Pain controlled with percocet.  -recommend continue compression stockings

## 2017-08-22 NOTE — Patient Instructions (Addendum)
It was nice seeing you again today!  You were seen in clinic for follow up of your hidradenitis suppurativa which causes the boils.   I am referring you to a general surgeon per your request to be evaluated for possible sweat gland removal.  You can expect a call within a week or so regarding scheduling this appointment.   Additionally, I have refilled your chronic pain medication and sent this to your pharmacy.   Please call clinic if you have any questions.   Be well, Lovenia Kim MD

## 2017-08-22 NOTE — Assessment & Plan Note (Addendum)
Recurrent boils over groin area which is becoming increasingly frustrating for pt.  Has undergone several I&Ds and courses of antibiotics.  Symptoms seem to appear about every 2-3 weeks.  She expresses interest in sweat gland removal.  No fever or systemic concerns.  Exam without concern for abscess or cellulitis.  -will place referral to general surgery for further eval -return precautions discussed -continue to monitor

## 2017-08-22 NOTE — Progress Notes (Signed)
   Subjective:   Patient ID: [name]    DOB: Apr 24, 1965, 52 y.o. female   MRN: [id number]  CC: recurrent hidradenitis suppurativa   HPI: [name] is a 52 y.o. female who presents to clinic today for recurrent hidradenitis suppurativa.  Hidradenitis suppurativa Pt reports a recurrent boil in her right groin region which has previously been lanced several times.  She has been taking her chronic prophylactic antibiotic as well as several courses of antibiotics.  She recently completed a course of Flagyl which provided some improvement.  No fever, chills, nausea, vomiting.  She wears loose-fitting clothing with cotton and natural fabrics.  Has been keeping the area as dry as possible.  She feels the boils recur about every 2 weeks which is increasingly frustrating for her.  She is interested in sweat gland removal in the groin area and would like to be referred for that.    ROS: No fever, chills, nausea, vomiting.  No abdominal pain, diarrhea.    Social: pt is an occasional smoker, currently trying to quit Medications reviewed. Objective:   BP 115/80   Pulse 78   Temp 98.5 F (36.9 C) (Oral)   Wt 244 lb 9.6 oz (110.9 kg)   LMP 08/01/2017 (Approximate)   SpO2 100%   BMI 37.19 kg/m  Vitals and nursing note reviewed.  General: 52 yo AA female, NAD  CV: RRR no MRG  Lungs: CTAB, normal effort  Abdomen: soft, NTND, +bs  Skin: warm, dry, 2 cm boil noted over right groin area, no surrounding erythema, tender to palpation  Extremities: warm and well perfused, normal tone  Assessment & Plan:   HIDRADENITIS SUPPURATIVA Recurrent boils over groin area which is becoming increasingly frustrating for pt.  Has undergone several I&Ds and courses of antibiotics.  Symptoms seem to appear about every 2-3 weeks.  She expresses interest in sweat gland removal.  No fever or systemic concerns.  Exam without concern for abscess or cellulitis.  -will place referral to general surgery for further  eval -return precautions discussed -continue to monitor    Orders Placed This Encounter  Procedures  . Ambulatory referral to General Surgery    Referral Priority:   Routine    Referral Type:   Surgical    Referral Reason:   Specialty Services Required    Requested Specialty:   General Surgery    Number of Visits Requested:   1   Meds ordered this encounter  Medications  . oxyCODONE-acetaminophen (PERCOCET) 10-325 MG tablet    Sig: Take 1 tablet by mouth every 8 (eight) hours as needed for pain.    Dispense:  84 tablet    Refill:  0  . albuterol (PROAIR HFA) 108 (90 Base) MCG/ACT inhaler    Sig: inhale 2 puffs every 4 hours if needed    Dispense:  8.5 g    Refill:  5  . meloxicam (MOBIC) 7.5 MG tablet    Sig: Take 1 tablet (7.5 mg total) by mouth daily.    Dispense:  30 tablet    Refill:  2    Lovenia Kim, MD [place], PGY-2 08/22/2017 2:14 PM

## 2017-08-25 ENCOUNTER — Telehealth: Payer: Self-pay | Admitting: *Deleted

## 2017-08-25 NOTE — Telephone Encounter (Signed)
Received fax from [facility] requesting prior authorization of Oxycodone-Acetaminophen. Submitted via [employer] for 180 day authorization. Status pending. Will check status next business day.  Danley Danker, RN [facility] RN)

## 2017-08-25 NOTE — Telephone Encounter (Signed)
Patient calling to request 6 month prior authorization approval for her oxycodone.  Authorization last month for only for 30 days.  Takes this med long term and doesn't want to request PA every month.  Will route note to PCP and Clinic RN for assistance.  Burna Forts, BSN, RN-BC

## 2017-08-26 NOTE — Telephone Encounter (Signed)
Prior approval for Oxycodone-Acetaminophen completed via [place] [employer].  Med approved for 08/25/17 - 02/21/18  Prior approval # [id number]. [place] informed. Danley Danker, RN [facility] RN)

## 2017-09-05 DIAGNOSIS — E669 Obesity, unspecified: Secondary | ICD-10-CM | POA: Diagnosis not present

## 2017-09-06 DIAGNOSIS — E669 Obesity, unspecified: Secondary | ICD-10-CM | POA: Diagnosis not present

## 2017-09-07 ENCOUNTER — Ambulatory Visit: Payer: Self-pay | Admitting: Surgery

## 2017-09-07 DIAGNOSIS — E669 Obesity, unspecified: Secondary | ICD-10-CM | POA: Diagnosis not present

## 2017-09-07 DIAGNOSIS — L732 Hidradenitis suppurativa: Secondary | ICD-10-CM | POA: Diagnosis not present

## 2017-09-07 NOTE — H&P (Signed)
[name] Documented: 09/07/2017 2:38 PM Location: [facility] Patient #: [id number] DOB: [birth year] Divorced / Language: [name] / Race: Black or African American Female  History of Present Illness ([name]; 09/07/2017 3:20 PM) Patient words: This is a very pleasant 52 year old woman who is referred for possible hidradenitis of the right groin. There is a focal area in the crease of her right groin which flares up about every 2 or 3 weeks and has been lanced numerous times at her primary care doctor's office. She is currently on suppressive antibiotics and is doing well and keeping the area clean and dry, wears below clothing etc. She does smoke 1 or 2 cigarettes a month. She is interested in having this area removed. She denies any history of issues in either axilla although she did have one episode of a boil in the left groin.  The patient is a 51 year old female.   Past Surgical History [name]; 09/07/2017 2:49 PM) Thyroid Surgery  Diagnostic Studies History [name]; 09/07/2017 2:49 PM) Colonoscopy 1-5 years ago Mammogram >3 years ago Pap Smear 1-5 years ago  Allergies [name]; 09/07/2017 2:52 PM) Vicodin *ANALGESICS - OPIOID* Allergies Reconciled  Medication History [name]; 09/07/2017 2:52 PM) Chantix (0.5MG  Tablet, Oral) Active. Pantoprazole Sodium (40MG  Tablet DR, Oral) Active. QUEtiapine Fumarate (50MG  Tablet, Oral) Active. Levothyroxine Sodium (175MCG Tablet, Oral) Active. ProAir HFA (108 (90 Base)MCG/ACT Aerosol Soln, Inhalation) Active. Medications Reconciled  Social History [name]; 09/07/2017 2:49 PM) Caffeine use Coffee. No drug use Tobacco use Current some day smoker.  Family History [name]; 09/07/2017 2:49 PM) Diabetes Mellitus Father. Heart Disease Father, Mother. Heart disease in female family member before age 14 Hypertension Mother. Thyroid problems Father, Mother.  Pregnancy /  Birth History [name]; 09/07/2017 2:49 PM) Age at menarche 20 years. Gravida 2 Irregular periods Maternal age 55-20 Para 2  Other Problems [name]; 09/07/2017 2:49 PM) Anxiety Disorder Arthritis Back Pain Chronic Obstructive Lung Disease Gastroesophageal Reflux Disease Thyroid Disease     Review of Systems [name]; 09/07/2017 2:49 PM) HEENT Present- Earache and Sore Throat. Not Present- Hearing Loss, Hoarseness, Nose Bleed, Oral Ulcers, Ringing in the Ears, Seasonal Allergies, Sinus Pain, Visual Disturbances, Wears glasses/contact lenses and Yellow Eyes. Respiratory Present- Chronic Cough. Not Present- Bloody sputum, Difficulty Breathing, Snoring and Wheezing. Gastrointestinal Present- Abdominal Pain, Bloating, Change in Bowel Habits, Excessive gas and Gets full quickly at meals. Not Present- Bloody Stool, Chronic diarrhea, Constipation, Difficulty Swallowing, Hemorrhoids, Indigestion, Nausea, Rectal Pain and Vomiting. Musculoskeletal Present- Back Pain, Joint Pain, Muscle Pain, Muscle Weakness and Swelling of Extremities. Not Present- Joint Stiffness. Neurological Present- Headaches. Not Present- Decreased Memory, Fainting, Numbness, Seizures, Tingling, Tremor, Trouble walking and Weakness. Psychiatric Present- Anxiety and Change in Sleep Pattern. Not Present- Bipolar, Depression, Fearful and Frequent crying. Endocrine Present- Hair Changes. Not Present- Cold Intolerance, Excessive Hunger, Heat Intolerance, Hot flashes and New Diabetes. Hematology Present- Easy Bruising, Gland problems and Persistent Infections. Not Present- Blood Thinners, Excessive bleeding and HIV.  Vitals [name]; 09/07/2017 2:53 PM) 09/07/2017 2:52 PM Weight: 249.13 lb Height: 69in Body Surface Area: 2.27 m Body Mass Index: 36.79 kg/m  Temp.: 98.13F(Oral)  Pulse: 74 (Regular)  BP: 134/86 (Sitting, Left Arm, Standard)      Physical Exam ([name];  09/07/2017 3:21 PM)  The physical exam findings are as follows: Note:Gen: alert and well appearing Eye: extraocular motion intact, no scleral icterus ENT: moist mucus membranes, dentition intact  Neck: no mass or thyromegaly Chest: unlabored respirations, symmetrical air entry, clear bilaterally CV: regular rate and rhythm, no pedal edema Abdomen: soft, nontender, nondistended. No mass or organomegaly MSK: strength symmetrical throughout, no deformity Neuro: grossly intact, normal gait Psych: normal mood and affect, appropriate insight Skin: warm and dry. In the right groin crease there is a oblique oriented scar with underlying soft tissue induration/scar approximately 5 cm on its long axis by about 2 cm wide. She states that when he gets inflamed, this extends more laterally and is as big as an egg.    Assessment & Plan ([name]; 09/07/2017 3:22 PM)  HIDRADENITIS (L73.2) Story: This is either very localized hidradenitis or a chronic subcutaneous cyst which has failed to resolve despite multiple I&D's. I recommended proceeding with local excision. We discussed risks of bleeding, infection, pain, scarring, wound problems which are very likely given the location, chance of open wound after surgery that would require daily dressing changes, as well as risk of recurrence of the disease in other places on her body that is hidradenitis although at 50 she should be approaching a very low-risk age for new-onset hidradenitis. Questions were welcomed and answered. She is eager to proceed with surgery.

## 2017-09-07 NOTE — H&P (View-Only) (Signed)
[name] Documented: 09/07/2017 2:38 PM Location: [facility] Patient #: [id number] DOB: 17-Oct-1965 Divorced / Language: [name] / Race: Black or African American Female  History of Present Illness ([name]; 09/07/2017 3:20 PM) Patient words: This is a very pleasant 52 year old woman who is referred for possible hidradenitis of the right groin. There is a focal area in the crease of her right groin which flares up about every 2 or 3 weeks and has been lanced numerous times at her primary care doctor's office. She is currently on suppressive antibiotics and is doing well and keeping the area clean and dry, wears below clothing etc. She does smoke 1 or 2 cigarettes a month. She is interested in having this area removed. She denies any history of issues in either axilla although she did have one episode of a boil in the left groin.  The patient is a 52 year old female.   Past Surgical History [name]; 09/07/2017 2:49 PM) Thyroid Surgery  Diagnostic Studies History [name]; 09/07/2017 2:49 PM) Colonoscopy 1-5 years ago Mammogram >3 years ago Pap Smear 1-5 years ago  Allergies [name]; 09/07/2017 2:52 PM) Vicodin *ANALGESICS - OPIOID* Allergies Reconciled  Medication History [name]; 09/07/2017 2:52 PM) Chantix (0.5MG  Tablet, Oral) Active. Pantoprazole Sodium (40MG  Tablet DR, Oral) Active. QUEtiapine Fumarate (50MG  Tablet, Oral) Active. Levothyroxine Sodium (175MCG Tablet, Oral) Active. ProAir HFA (108 (90 Base)MCG/ACT Aerosol Soln, Inhalation) Active. Medications Reconciled  Social History [name]; 09/07/2017 2:49 PM) Caffeine use Coffee. No drug use Tobacco use Current some day smoker.  Family History [name]; 09/07/2017 2:49 PM) Diabetes Mellitus Father. Heart Disease Father, Mother. Heart disease in female family member before age 42 Hypertension Mother. Thyroid problems Father, Mother.  Pregnancy /  Birth History [name]; 09/07/2017 2:49 PM) Age at menarche 36 years. Gravida 2 Irregular periods Maternal age 24-20 Para 2  Other Problems [name]; 09/07/2017 2:49 PM) Anxiety Disorder Arthritis Back Pain Chronic Obstructive Lung Disease Gastroesophageal Reflux Disease Thyroid Disease     Review of Systems [name]; 09/07/2017 2:49 PM) HEENT Present- Earache and Sore Throat. Not Present- Hearing Loss, Hoarseness, Nose Bleed, Oral Ulcers, Ringing in the Ears, Seasonal Allergies, Sinus Pain, Visual Disturbances, Wears glasses/contact lenses and Yellow Eyes. Respiratory Present- Chronic Cough. Not Present- Bloody sputum, Difficulty Breathing, Snoring and Wheezing. Gastrointestinal Present- Abdominal Pain, Bloating, Change in Bowel Habits, Excessive gas and Gets full quickly at meals. Not Present- Bloody Stool, Chronic diarrhea, Constipation, Difficulty Swallowing, Hemorrhoids, Indigestion, Nausea, Rectal Pain and Vomiting. Musculoskeletal Present- Back Pain, Joint Pain, Muscle Pain, Muscle Weakness and Swelling of Extremities. Not Present- Joint Stiffness. Neurological Present- Headaches. Not Present- Decreased Memory, Fainting, Numbness, Seizures, Tingling, Tremor, Trouble walking and Weakness. Psychiatric Present- Anxiety and Change in Sleep Pattern. Not Present- Bipolar, Depression, Fearful and Frequent crying. Endocrine Present- Hair Changes. Not Present- Cold Intolerance, Excessive Hunger, Heat Intolerance, Hot flashes and New Diabetes. Hematology Present- Easy Bruising, Gland problems and Persistent Infections. Not Present- Blood Thinners, Excessive bleeding and HIV.  Vitals [name]; 09/07/2017 2:53 PM) 09/07/2017 2:52 PM Weight: 249.13 lb Height: 69in Body Surface Area: 2.27 m Body Mass Index: 36.79 kg/m  Temp.: 98.37F(Oral)  Pulse: 74 (Regular)  BP: 134/86 (Sitting, Left Arm, Standard)      Physical Exam (Kamber Vignola A. Kae Heller [name];  09/07/2017 3:21 PM)  The physical exam findings are as follows: Note:Gen: alert and well appearing Eye: extraocular motion intact, no scleral icterus ENT: moist mucus membranes, dentition intact  Neck: no mass or thyromegaly Chest: unlabored respirations, symmetrical air entry, clear bilaterally CV: regular rate and rhythm, no pedal edema Abdomen: soft, nontender, nondistended. No mass or organomegaly MSK: strength symmetrical throughout, no deformity Neuro: grossly intact, normal gait Psych: normal mood and affect, appropriate insight Skin: warm and dry. In the right groin crease there is a oblique oriented scar with underlying soft tissue induration/scar approximately 5 cm on its long axis by about 2 cm wide. She states that when he gets inflamed, this extends more laterally and is as big as an egg.    Assessment & Plan ([name]; 09/07/2017 3:22 PM)  HIDRADENITIS (L73.2) Story: This is either very localized hidradenitis or a chronic subcutaneous cyst which has failed to resolve despite multiple I&D's. I recommended proceeding with local excision. We discussed risks of bleeding, infection, pain, scarring, wound problems which are very likely given the location, chance of open wound after surgery that would require daily dressing changes, as well as risk of recurrence of the disease in other places on her body that is hidradenitis although at 89 she should be approaching a very low-risk age for new-onset hidradenitis. Questions were welcomed and answered. She is eager to proceed with surgery.

## 2017-09-08 DIAGNOSIS — E669 Obesity, unspecified: Secondary | ICD-10-CM | POA: Diagnosis not present

## 2017-09-09 DIAGNOSIS — E669 Obesity, unspecified: Secondary | ICD-10-CM | POA: Diagnosis not present

## 2017-09-10 DIAGNOSIS — E669 Obesity, unspecified: Secondary | ICD-10-CM | POA: Diagnosis not present

## 2017-09-11 DIAGNOSIS — E669 Obesity, unspecified: Secondary | ICD-10-CM | POA: Diagnosis not present

## 2017-09-12 ENCOUNTER — Encounter (HOSPITAL_COMMUNITY)
Admission: RE | Admit: 2017-09-12 | Discharge: 2017-09-12 | Disposition: A | Discharge status: Home / Self Care | Payer: Medicaid Other | Source: Ambulatory Visit | Attending: Surgery | Admitting: Surgery

## 2017-09-12 ENCOUNTER — Encounter (HOSPITAL_COMMUNITY): Payer: Self-pay

## 2017-09-12 ENCOUNTER — Other Ambulatory Visit: Payer: Self-pay

## 2017-09-12 DIAGNOSIS — E669 Obesity, unspecified: Secondary | ICD-10-CM | POA: Diagnosis not present

## 2017-09-12 DIAGNOSIS — Z01810 Encounter for preprocedural cardiovascular examination: Secondary | ICD-10-CM | POA: Diagnosis not present

## 2017-09-12 DIAGNOSIS — Z01812 Encounter for preprocedural laboratory examination: Secondary | ICD-10-CM | POA: Diagnosis not present

## 2017-09-12 DIAGNOSIS — L732 Hidradenitis suppurativa: Secondary | ICD-10-CM | POA: Insufficient documentation

## 2017-09-12 HISTORY — DX: Other complications of anesthesia, initial encounter: T88.59XA

## 2017-09-12 HISTORY — DX: Adverse effect of unspecified anesthetic, initial encounter: T41.45XA

## 2017-09-12 LAB — CBC WITH DIFFERENTIAL/PLATELET
Basophils Absolute: 0 10*3/uL (ref 0.0–0.1)
Basophils Relative: 1 %
EOS ABS: 0.1 10*3/uL (ref 0.0–0.7)
Eosinophils Relative: 3 %
HCT: 38 % (ref 36.0–46.0)
HEMOGLOBIN: 13.1 g/dL (ref 12.0–15.0)
LYMPHS ABS: 1.5 10*3/uL (ref 0.7–4.0)
Lymphocytes Relative: 47 %
MCH: 32.9 pg (ref 26.0–34.0)
MCHC: 34.5 g/dL (ref 30.0–36.0)
MCV: 95.5 fL (ref 78.0–100.0)
MONO ABS: 0.3 10*3/uL (ref 0.1–1.0)
MONOS PCT: 10 %
Neutro Abs: 1.2 10*3/uL — ABNORMAL LOW (ref 1.7–7.7)
Neutrophils Relative %: 39 %
Platelets: 272 10*3/uL (ref 150–400)
RBC: 3.98 MIL/uL (ref 3.87–5.11)
RDW: 12.6 % (ref 11.5–15.5)
WBC: 3 10*3/uL — ABNORMAL LOW (ref 4.0–10.5)

## 2017-09-12 LAB — BASIC METABOLIC PANEL
Anion gap: 5 (ref 5–15)
BUN: 15 mg/dL (ref 6–20)
CHLORIDE: 108 mmol/L (ref 98–111)
CO2: 27 mmol/L (ref 22–32)
CREATININE: 0.85 mg/dL (ref 0.44–1.00)
Calcium: 8.7 mg/dL — ABNORMAL LOW (ref 8.9–10.3)
GFR calc non Af Amer: >60 mL/min (ref >60)
GLUCOSE: 122 mg/dL — AB (ref 70–99)
Potassium: 4.2 mmol/L (ref 3.5–5.1)
Sodium: 140 mmol/L (ref 135–145)

## 2017-09-12 LAB — HCG, SERUM, QUALITATIVE: Preg, Serum: NEGATIVE

## 2017-09-12 MED ORDER — BUPIVACAINE LIPOSOME 1.3 % IJ SUSP
20.0000 mL | INTRAMUSCULAR | Status: DC
  Filled 2017-09-12: qty 20

## 2017-09-12 NOTE — Patient Instructions (Signed)
[name]  09/12/2017   Your procedure is scheduled on: 09-13-17  Report to [facility] Main  Entrance  Report to admitting at        0900  AM    Call this number if you have problems the morning of surgery [phone]   Remember: Do not eat food or drink liquids :After Midnight.     Take these medicines the morning of surgery with A SIP OF WATER: protonix, lyrica, levothyroxine, inhaler abd bring                                You may not have any metal on your body including hair pins and              piercings  Do not wear jewelry, make-up, lotions, powders or perfumes, deodorant             Do not wear nail polish.  Do not shave  48 hours prior to surgery.     Do not bring valuables to the hospital. [place].  Contacts, dentures or bridgework may not be worn into surgery.      Patients discharged the day of surgery will not be allowed to drive home.  Name and phone number of your driver:  Special Instructions: N/A              Please read over the following fact sheets you were given: _____________________________________________________________________             [facility] - Preparing for Surgery Before surgery, you can play an important role.  Because skin is not sterile, your skin needs to be as free of germs as possible.  You can reduce the number of germs on your skin by washing with CHG (chlorahexidine gluconate) soap before surgery.  CHG is an antiseptic cleaner which kills germs and bonds with the skin to continue killing germs even after washing. Please DO NOT use if you have an allergy to CHG or antibacterial soaps.  If your skin becomes reddened/irritated stop using the CHG and inform your nurse when you arrive at Short Stay. Do not shave (including legs and underarms) for at least 48 hours prior to the first CHG shower.  You may shave your face/neck. Please follow these instructions  carefully:  1.  Shower with CHG Soap the night before surgery and the  morning of Surgery.  2.  If you choose to wash your hair, wash your hair first as usual with your  normal  shampoo.  3.  After you shampoo, rinse your hair and body thoroughly to remove the  shampoo.                           4.  Use CHG as you would any other liquid soap.  You can apply chg directly  to the skin and wash                       Gently with a scrungie or clean washcloth.  5.  Apply the CHG Soap to your body ONLY FROM THE NECK DOWN.   Do not use on face/ open  Wound or open sores. Avoid contact with eyes, ears mouth and genitals (private parts).                       Wash face,  Genitals (private parts) with your normal soap.             6.  Wash thoroughly, paying special attention to the area where your surgery  will be performed.  7.  Thoroughly rinse your body with warm water from the neck down.  8.  DO NOT shower/wash with your normal soap after using and rinsing off  the CHG Soap.                9.  Pat yourself dry with a clean towel.            10.  Wear clean pajamas.            11.  Place clean sheets on your bed the night of your first shower and do not  sleep with pets. Day of Surgery : Do not apply any lotions/deodorants the morning of surgery.  Please wear clean clothes to the hospital/surgery center.  FAILURE TO FOLLOW THESE INSTRUCTIONS MAY RESULT IN THE CANCELLATION OF YOUR SURGERY PATIENT SIGNATURE_________________________________  NURSE SIGNATURE__________________________________  ________________________________________________________________________

## 2017-09-13 ENCOUNTER — Ambulatory Visit (HOSPITAL_COMMUNITY)
Admission: RE | Admit: 2017-09-13 | Discharge: 2017-09-13 | Disposition: A | Discharge status: Home / Self Care | Payer: Medicaid Other | Source: Ambulatory Visit | Attending: Surgery | Admitting: Surgery

## 2017-09-13 ENCOUNTER — Encounter (HOSPITAL_COMMUNITY): Payer: Self-pay | Admitting: *Deleted

## 2017-09-13 ENCOUNTER — Encounter (HOSPITAL_COMMUNITY)
Admission: RE | Disposition: A | Discharge status: Home / Self Care | Payer: Self-pay | Source: Ambulatory Visit | Attending: Surgery

## 2017-09-13 ENCOUNTER — Ambulatory Visit (HOSPITAL_COMMUNITY): Payer: Medicaid Other | Admitting: Anesthesiology

## 2017-09-13 DIAGNOSIS — E039 Hypothyroidism, unspecified: Secondary | ICD-10-CM | POA: Insufficient documentation

## 2017-09-13 DIAGNOSIS — Z79899 Other long term (current) drug therapy: Secondary | ICD-10-CM | POA: Insufficient documentation

## 2017-09-13 DIAGNOSIS — M1990 Unspecified osteoarthritis, unspecified site: Secondary | ICD-10-CM | POA: Insufficient documentation

## 2017-09-13 DIAGNOSIS — Z8249 Family history of ischemic heart disease and other diseases of the circulatory system: Secondary | ICD-10-CM | POA: Diagnosis not present

## 2017-09-13 DIAGNOSIS — L720 Epidermal cyst: Secondary | ICD-10-CM | POA: Insufficient documentation

## 2017-09-13 DIAGNOSIS — F329 Major depressive disorder, single episode, unspecified: Secondary | ICD-10-CM | POA: Diagnosis not present

## 2017-09-13 DIAGNOSIS — Z885 Allergy status to narcotic agent status: Secondary | ICD-10-CM | POA: Diagnosis not present

## 2017-09-13 DIAGNOSIS — F419 Anxiety disorder, unspecified: Secondary | ICD-10-CM | POA: Diagnosis not present

## 2017-09-13 DIAGNOSIS — J449 Chronic obstructive pulmonary disease, unspecified: Secondary | ICD-10-CM | POA: Diagnosis not present

## 2017-09-13 DIAGNOSIS — K219 Gastro-esophageal reflux disease without esophagitis: Secondary | ICD-10-CM | POA: Insufficient documentation

## 2017-09-13 DIAGNOSIS — L732 Hidradenitis suppurativa: Secondary | ICD-10-CM | POA: Insufficient documentation

## 2017-09-13 DIAGNOSIS — Z87891 Personal history of nicotine dependence: Secondary | ICD-10-CM | POA: Insufficient documentation

## 2017-09-13 DIAGNOSIS — E669 Obesity, unspecified: Secondary | ICD-10-CM | POA: Diagnosis not present

## 2017-09-13 DIAGNOSIS — E785 Hyperlipidemia, unspecified: Secondary | ICD-10-CM | POA: Diagnosis not present

## 2017-09-13 HISTORY — PX: HYDRADENITIS EXCISION: SHX5243

## 2017-09-13 SURGERY — EXCISION, HIDRADENITIS, INGUINAL REGION
Anesthesia: General | Site: Groin | Laterality: Right

## 2017-09-13 MED ORDER — ONDANSETRON HCL 4 MG/2ML IJ SOLN
INTRAMUSCULAR | Status: AC
  Filled 2017-09-13: qty 2

## 2017-09-13 MED ORDER — LACTATED RINGERS IV SOLN
INTRAVENOUS | Status: DC
  Administered 2017-09-13 (×2): via INTRAVENOUS

## 2017-09-13 MED ORDER — MIDAZOLAM HCL 2 MG/2ML IJ SOLN
INTRAMUSCULAR | Status: AC
  Administered 2017-09-13: 1 mg
  Filled 2017-09-13: qty 2

## 2017-09-13 MED ORDER — LIDOCAINE 2% (20 MG/ML) 5 ML SYRINGE
INTRAMUSCULAR | Status: AC
  Filled 2017-09-13: qty 5

## 2017-09-13 MED ORDER — LACTATED RINGERS IV SOLN
INTRAVENOUS | Status: DC
  Administered 2017-09-13: 12:00:00 via INTRAVENOUS

## 2017-09-13 MED ORDER — DOCUSATE SODIUM 100 MG PO CAPS: 100.0000 mg | ORAL_CAPSULE | Freq: Two times a day (BID) | ORAL | 0 refills | Status: AC

## 2017-09-13 MED ORDER — FENTANYL CITRATE (PF) 100 MCG/2ML IJ SOLN
INTRAMUSCULAR | Status: AC
  Filled 2017-09-13: qty 2

## 2017-09-13 MED ORDER — DEXAMETHASONE SODIUM PHOSPHATE 10 MG/ML IJ SOLN
INTRAMUSCULAR | Status: DC | PRN
  Administered 2017-09-13: 10 mg via INTRAVENOUS

## 2017-09-13 MED ORDER — ONDANSETRON HCL 4 MG/2ML IJ SOLN
4.0000 mg | Freq: Once | INTRAMUSCULAR | Status: AC | PRN
  Administered 2017-09-13: 4 mg via INTRAVENOUS

## 2017-09-13 MED ORDER — FENTANYL CITRATE (PF) 100 MCG/2ML IJ SOLN
25.0000 ug | INTRAMUSCULAR | Status: DC | PRN
  Administered 2017-09-13 (×2): 50 ug via INTRAVENOUS

## 2017-09-13 MED ORDER — PROPOFOL 10 MG/ML IV BOLUS
INTRAVENOUS | Status: DC | PRN
  Administered 2017-09-13: 200 mg via INTRAVENOUS

## 2017-09-13 MED ORDER — CHLORHEXIDINE GLUCONATE 4 % EX LIQD: 60.0000 mL | Freq: Once | CUTANEOUS | Status: DC

## 2017-09-13 MED ORDER — CEFAZOLIN SODIUM-DEXTROSE 2-4 GM/100ML-% IV SOLN
2.0000 g | INTRAVENOUS | Status: AC
  Administered 2017-09-13: 2 g via INTRAVENOUS
  Filled 2017-09-13: qty 100

## 2017-09-13 MED ORDER — FENTANYL CITRATE (PF) 250 MCG/5ML IJ SOLN
INTRAMUSCULAR | Status: DC | PRN
  Administered 2017-09-13: 100 ug via INTRAVENOUS

## 2017-09-13 MED ORDER — FENTANYL CITRATE (PF) 250 MCG/5ML IJ SOLN
INTRAMUSCULAR | Status: AC
  Filled 2017-09-13: qty 5

## 2017-09-13 MED ORDER — MIDAZOLAM HCL 2 MG/2ML IJ SOLN: 1.0000 mg | INTRAMUSCULAR | Status: DC

## 2017-09-13 MED ORDER — LIDOCAINE 2% (20 MG/ML) 5 ML SYRINGE
INTRAMUSCULAR | Status: DC | PRN
  Administered 2017-09-13: 60 mg via INTRAVENOUS

## 2017-09-13 MED ORDER — ACETAMINOPHEN 500 MG PO TABS
1000.0000 mg | ORAL_TABLET | ORAL | Status: AC
  Administered 2017-09-13: 1000 mg via ORAL
  Filled 2017-09-13: qty 2

## 2017-09-13 MED ORDER — FENTANYL CITRATE (PF) 100 MCG/2ML IJ SOLN: 25.0000 ug | INTRAMUSCULAR | Status: DC | PRN

## 2017-09-13 MED ORDER — BUPIVACAINE-EPINEPHRINE (PF) 0.25% -1:200000 IJ SOLN
INTRAMUSCULAR | Status: DC | PRN
  Administered 2017-09-13: 6 mL via PERINEURAL

## 2017-09-13 MED ORDER — PROPOFOL 10 MG/ML IV BOLUS
INTRAVENOUS | Status: AC
  Filled 2017-09-13: qty 20

## 2017-09-13 MED ORDER — 0.9 % SODIUM CHLORIDE (POUR BTL) OPTIME
TOPICAL | Status: DC | PRN
Start: 2017-09-13 — End: 2017-09-13
  Administered 2017-09-13: 1000 mL

## 2017-09-13 MED ORDER — GABAPENTIN 300 MG PO CAPS
300.0000 mg | ORAL_CAPSULE | ORAL | Status: AC
  Administered 2017-09-13: 300 mg via ORAL
  Filled 2017-09-13: qty 1

## 2017-09-13 MED ORDER — SODIUM CHLORIDE 0.9 % IV SOLN: 250.0000 mL | INTRAVENOUS | Status: DC | PRN

## 2017-09-13 MED ORDER — MIDAZOLAM HCL 2 MG/2ML IJ SOLN
INTRAMUSCULAR | Status: AC
  Filled 2017-09-13: qty 2

## 2017-09-13 MED ORDER — SODIUM CHLORIDE 0.9% FLUSH: 3.0000 mL | INTRAVENOUS | Status: DC | PRN

## 2017-09-13 MED ORDER — OXYCODONE HCL 5 MG PO TABS: 5.0000 mg | ORAL_TABLET | ORAL | Status: DC | PRN

## 2017-09-13 MED ORDER — CELECOXIB 200 MG PO CAPS
200.0000 mg | ORAL_CAPSULE | ORAL | Status: AC
  Administered 2017-09-13: 200 mg via ORAL
  Filled 2017-09-13: qty 1

## 2017-09-13 MED ORDER — DEXAMETHASONE SODIUM PHOSPHATE 10 MG/ML IJ SOLN
INTRAMUSCULAR | Status: AC
  Filled 2017-09-13: qty 1

## 2017-09-13 MED ORDER — MIDAZOLAM HCL 2 MG/2ML IJ SOLN
INTRAMUSCULAR | Status: DC | PRN
  Administered 2017-09-13: 2 mg via INTRAVENOUS

## 2017-09-13 MED ORDER — ACETAMINOPHEN 325 MG PO TABS: 650.0000 mg | ORAL_TABLET | ORAL | Status: DC | PRN

## 2017-09-13 MED ORDER — BUPIVACAINE-EPINEPHRINE (PF) 0.25% -1:200000 IJ SOLN
INTRAMUSCULAR | Status: AC
  Filled 2017-09-13: qty 30

## 2017-09-13 MED ORDER — ACETAMINOPHEN 650 MG RE SUPP
650.0000 mg | RECTAL | Status: DC | PRN
  Filled 2017-09-13: qty 1

## 2017-09-13 MED ORDER — ONDANSETRON HCL 4 MG/2ML IJ SOLN
INTRAMUSCULAR | Status: DC | PRN
  Administered 2017-09-13: 4 mg via INTRAVENOUS

## 2017-09-13 MED ORDER — OXYCODONE-ACETAMINOPHEN 5-325 MG PO TABS: 1.0000 | ORAL_TABLET | Freq: Four times a day (QID) | ORAL | 0 refills | Status: DC | PRN

## 2017-09-13 MED ORDER — SODIUM CHLORIDE 0.9% FLUSH: 3.0000 mL | Freq: Two times a day (BID) | INTRAVENOUS | Status: DC

## 2017-09-13 SURGICAL SUPPLY — 21 items
BNDG GAUZE ELAST 4 BULKY (GAUZE/BANDAGES/DRESSINGS) IMPLANT
DRAPE LAPAROSCOPIC ABDOMINAL (DRAPES) ×2 IMPLANT
DRAPE SHEET LG 3/4 BI-LAMINATE (DRAPES) IMPLANT
DRSG PAD ABDOMINAL 8X10 ST (GAUZE/BANDAGES/DRESSINGS) ×2 IMPLANT
ELECT REM PT RETURN 15FT ADLT (MISCELLANEOUS) ×2 IMPLANT
GAUZE PACKING 1 X5 YD ST (GAUZE/BANDAGES/DRESSINGS) IMPLANT
GAUZE PACKING 1/2X5YD (GAUZE/BANDAGES/DRESSINGS) IMPLANT
GAUZE PACKING 2X5 YD STRL (GAUZE/BANDAGES/DRESSINGS) IMPLANT
GAUZE SPONGE 4X4 12PLY STRL (GAUZE/BANDAGES/DRESSINGS) ×2 IMPLANT
GLOVE ECLIPSE 8.0 STRL XLNG CF (GLOVE) ×2 IMPLANT
GLOVE INDICATOR 8.0 STRL GRN (GLOVE) ×2 IMPLANT
GOWN STRL REUS W/TWL XL LVL3 (GOWN DISPOSABLE) ×4 IMPLANT
KIT BASIN OR (CUSTOM PROCEDURE TRAY) ×2 IMPLANT
PACK GENERAL/GYN (CUSTOM PROCEDURE TRAY) ×2 IMPLANT
SUT MON AB-0 CT1 36 (SUTURE) ×2 IMPLANT
SUT VIC AB 3-0 SH 27 (SUTURE) ×2
SUT VIC AB 3-0 SH 27XBRD (SUTURE) ×1 IMPLANT
SWAB COLLECTION DEVICE MRSA (MISCELLANEOUS) IMPLANT
SWAB CULTURE ESWAB REG 1ML (MISCELLANEOUS) IMPLANT
TOWEL OR 17X26 10 PK STRL BLUE (TOWEL DISPOSABLE) ×2 IMPLANT
TOWEL OR NON WOVEN STRL DISP B (DISPOSABLE) ×2 IMPLANT

## 2017-09-13 NOTE — Anesthesia Procedure Notes (Signed)
Procedure Name: LMA Insertion Date/Time: 09/13/2017 12:20 PM Performed by: Renato Shin, CRNA Pre-anesthesia Checklist: Emergency Drugs available, Suction available, Patient being monitored and Patient identified Patient Re-evaluated:Patient Re-evaluated prior to induction Oxygen Delivery Method: Circle system utilized Preoxygenation: Pre-oxygenation with 100% oxygen Induction Type: IV induction LMA: LMA inserted LMA Size: 4.0 Number of attempts: 1 Placement Confirmation: positive ETCO2,  CO2 detector and breath sounds checked- equal and bilateral Tube secured with: Tape Dental Injury: Teeth and Oropharynx as per pre-operative assessment

## 2017-09-13 NOTE — Interval H&P Note (Signed)
History and Physical Interval Note:  09/13/2017 9:57 AM  [name]  has presented today for surgery, with the diagnosis of Hidradenitis  The various methods of treatment have been discussed with the patient and family. After consideration of risks, benefits and other options for treatment, the patient has consented to  Procedure(s) with comments: EXCISION HIDRADENITIS GROIN (Right) - LMA as a surgical intervention .  The patient's history has been reviewed, patient examined, no change in status, stable for surgery.  I have reviewed the patient's chart and labs.  Questions were answered to the patient's satisfaction.     Chelsea Rich Brave

## 2017-09-13 NOTE — Transfer of Care (Signed)
Immediate Anesthesia Transfer of Care Note  Patient: [name]  Procedure(s) Performed: EXCISION HIDRADENITIS GROIN (Right Groin)  Patient Location: PACU  Anesthesia Type:General  Level of Consciousness: awake, alert , oriented and patient cooperative  Airway & Oxygen Therapy: Patient Spontanous Breathing and Patient connected to face mask oxygen  Post-op Assessment: Report given to RN and Post -op Vital signs reviewed and stable  Post vital signs: Reviewed and stable  Last Vitals:  Vitals Value Taken Time  BP    Temp    Pulse    Resp    SpO2      Last Pain:  Vitals:   09/13/17 0933  TempSrc: Oral         Complications: No apparent anesthesia complications

## 2017-09-13 NOTE — Op Note (Signed)
Operative Note  [name]  [id number]  [id number]  09/13/2017   Surgeon: Vikki Ports A ConnorMD  Assistant: none  Procedure performed: excision of right groin hidradenitis 4x1.5cm  Preop diagnosis: hidradenitis Post-op diagnosis/intraop findings: same  Specimens: right groin hidradenitis Retained items: none EBL: minimal cc Complications: none  Description of procedure: After obtaining informed consent the patient was taken to the operating room and placed supine on operating room table wheregeneral LMA anesthesia was initiated, preoperative antibiotics were administered, SCDs applied, and a formal timeout was performed. The right groin was prepped and draped in the usual sterile fashion. After infiltration with 0.25% marcaine with epinephrine, an elliptical incision was made sharply around the region of prior multiple recurrent abscesses which at this point was a small subcutaneous lesion. Cautery was used to excise the skin and subcutaneous tissue surrounding this lesion leaving a wound 4x1.5cm with healthy subcutaneous fat. This was closed with interrupted deep dermal 3-0 vicryl and running subcuticular monocryl. A dry dressing was applied.  The patient was then awakened, extubated and taken to PACU in stable condition.   All counts were correct at the completion of the case.

## 2017-09-13 NOTE — Discharge Instructions (Signed)
GENERAL SURGERY: POST OP INSTRUCTIONS  ######################################################################  EAT Gradually transition to a high fiber diet with a fiber supplement over the next few weeks after discharge.  Start with a pureed / full liquid diet (see below)  WALK Walk an hour a day.  Control your pain to do that.    CONTROL PAIN Control pain so that you can walk, sleep, tolerate sneezing/coughing, go up/down stairs.  HAVE A BOWEL MOVEMENT DAILY Keep your bowels regular to avoid problems.  OK to try a laxative to override constipation.  OK to use an antidairrheal to slow down diarrhea.  Call if not better after 2 tries  CALL IF YOU HAVE PROBLEMS/CONCERNS Call if you are still struggling despite following these instructions. Call if you have concerns not answered by these instructions  ######################################################################    1. DIET: Follow a light bland diet the first 24 hours after arrival home, such as soup, liquids, crackers, etc.  Be sure to include lots of fluids daily.  Avoid fast food or heavy meals as your are more likely to get nauseated.   2. Take your usually prescribed home medications unless otherwise directed. 3. PAIN CONTROL: a. Pain is best controlled by a usual combination of three different methods TOGETHER: i. Ice/Heat ii. Over the counter pain medication iii. Prescription pain medication b. Most patients will experience some swelling and bruising around the incisions.  Ice packs or heating pads (30-60 minutes up to 6 times a day) will help. Use ice for the first few days to help decrease swelling and bruising, then switch to heat to help relax tight/sore spots and speed recovery.  Some people prefer to use ice alone, heat alone, alternating between ice & heat.  Experiment to what works for you.  Swelling and bruising can take several weeks to resolve.   c. It is helpful to take an over-the-counter pain medication  regularly for the first few weeks.  Choose one of the following that works best for you: i. Naproxen (Aleve, etc)  Two 220mg  tabs twice a day ii. Ibuprofen (Advil, etc) Three 200mg  tabs four times a day (every meal & bedtime) iii. Acetaminophen (Tylenol, etc) 500-650mg  four times a day (every meal & bedtime) d. A  prescription for pain medication (such as oxycodone, hydrocodone, etc) should be given to you upon discharge.  Take your pain medication as prescribed.  i. If you are having problems/concerns with the prescription medicine (does not control pain, nausea, vomiting, rash, itching, etc), please call us [phone] to see if we need to switch you to a different pain medicine that will work better for you and/or control your side effect better. ii. If you need a refill on your pain medication, please contact your pharmacy.  They will contact our office to request authorization. Prescriptions will not be filled after 5 pm or on week-ends. 4. Avoid getting constipated.  Between the surgery and the pain medications, it is common to experience some constipation.  Increasing fluid intake and taking a fiber supplement (such as Metamucil, Citrucel, FiberCon, MiraLax, etc) 1-2 times a day regularly will usually help prevent this problem from occurring.  A mild laxative (prune juice, Milk of Magnesia, MiraLax, etc) should be taken according to package directions if there are no bowel movements after 48 hours.   5. Wash / shower every day starting the second day after surgery.  No rubbing, scrubbing, lotions or ointments to incision.  6. Remove your  bandages 2 days after surgery or if  they become dirty or wet.  You may leave the incision open to air.  You may have skin tapes (Steri Strips) covering the incision(s).  Leave them on until one week, then remove.  You may replace a dressing/Band-Aid to cover the incision for comfort if you wish.    7. ACTIVITIES as tolerated:   a. You may resume regular  (light) daily activities beginning the next day--such as daily self-care, walking, climbing stairs--gradually increasing activities as tolerated.  If you can walk 30 minutes without difficulty, it is safe to try more intense activity such as jogging, treadmill, bicycling, low-impact aerobics, swimming, etc. b. Save the most intensive and strenuous activity for last such as sit-ups, heavy lifting, contact sports, etc  Refrain from any heavy lifting or straining until you are off narcotics for pain control.   c. DO NOT PUSH THROUGH PAIN.  Let pain be your guide: If it hurts to do something, don't do it.  Pain is your body warning you to avoid that activity for another week until the pain goes down. d. You may drive when you are no longer taking prescription pain medication, you can comfortably wear a seatbelt, and you can safely maneuver your car and apply brakes. e. Dennis Bast may have sexual intercourse when it is comfortable.  8. FOLLOW UP in our office a. Please call [facility] at [phone] to set up an appointment to see your surgeon in the office for a follow-up appointment approximately 2-3 weeks after your surgery. b. Make sure that you call for this appointment the day you arrive home to insure a convenient appointment time. 9. IF YOU HAVE DISABILITY OR FAMILY LEAVE FORMS, BRING THEM TO THE OFFICE FOR PROCESSING.  DO NOT GIVE THEM TO YOUR DOCTOR.   WHEN TO CALL us [phone]: 1. Poor pain control 2. Reactions / problems with new medications (rash/itching, nausea, etc)  3. Fever over 101.5 F (38.5 C) 4. Worsening swelling or bruising 5. Continued bleeding from incision. 6. Increased pain, redness, or drainage from the incision 7. Difficulty breathing / swallowing   The clinic staff is available to answer your questions during regular business hours (8:30am-5pm).  Please dont hesitate to call and ask to speak to one of our nurses for clinical concerns.   If you have a medical emergency, go to  the nearest emergency room or call 911.  A surgeon from [facility] is always on call at the [facility], [address], [place], [place], [place]  [id number] ? MAIN: [phone] ? TOLL FREE: [phone] ?  FAX [phone] [email or web address]

## 2017-09-13 NOTE — Anesthesia Preprocedure Evaluation (Signed)
Anesthesia Evaluation  Patient identified by MRN, date of birth, ID band Patient awake    Reviewed: Allergy & Precautions, NPO status , Patient's Chart, lab work & pertinent test results  Airway Mallampati: II  TM Distance: >3 FB Neck ROM: Full    Dental  (+) Dental Advisory Given   Pulmonary COPD, former smoker,    breath sounds clear to auscultation       Cardiovascular negative cardio ROS   Rhythm:Regular Rate:Normal     Neuro/Psych  Headaches, Anxiety Depression    GI/Hepatic Neg liver ROS, GERD  ,  Endo/Other  Hypothyroidism   Renal/GU negative Renal ROS     Musculoskeletal  (+) Arthritis ,   Abdominal   Peds  Hematology negative hematology ROS (+)   Anesthesia Other Findings   Reproductive/Obstetrics                             Lab Results  Component Value Date   WBC 3.0 (L) 09/12/2017   HGB 13.1 09/12/2017   HCT 38.0 09/12/2017   MCV 95.5 09/12/2017   PLT 272 09/12/2017   Lab Results  Component Value Date   CREATININE 0.85 09/12/2017   BUN 15 09/12/2017   NA 140 09/12/2017   K 4.2 09/12/2017   CL 108 09/12/2017   CO2 27 09/12/2017    Anesthesia Physical Anesthesia Plan  ASA: III  Anesthesia Plan: General   Post-op Pain Management:    Induction: Intravenous  PONV Risk Score and Plan: 3 and Dexamethasone, Ondansetron, Scopolamine patch - Pre-op and Treatment may vary due to age or medical condition  Airway Management Planned: Oral ETT and LMA  Additional Equipment:   Intra-op Plan:   Post-operative Plan: Extubation in OR  Informed Consent: I have reviewed the patients History and Physical, chart, labs and discussed the procedure including the risks, benefits and alternatives for the proposed anesthesia with the patient or authorized representative who has indicated his/her understanding and acceptance.   Dental advisory given  Plan Discussed with:  CRNA  Anesthesia Plan Comments:         Anesthesia Quick Evaluation

## 2017-09-13 NOTE — Anesthesia Postprocedure Evaluation (Signed)
Anesthesia Post Note  Patient: MONTE BRONDER  Procedure(s) Performed: EXCISION HIDRADENITIS GROIN (Right Groin)     Patient location during evaluation: PACU Anesthesia Type: General Level of consciousness: awake and alert Pain management: pain level controlled Vital Signs Assessment: post-procedure vital signs reviewed and stable Respiratory status: spontaneous breathing, nonlabored ventilation, respiratory function stable and patient connected to nasal cannula oxygen Cardiovascular status: blood pressure returned to baseline and stable Postop Assessment: no apparent nausea or vomiting Anesthetic complications: no    Last Vitals:  Vitals:   09/13/17 1326 09/13/17 1347  BP: 112/78 (!) 133/91  Pulse: 70 (!) 56  Resp: 16 16  Temp: 36.8 C 36.6 C  SpO2: 100% 100%    Last Pain:  Vitals:   09/13/17 1326  TempSrc:   PainSc: 7                  Tiajuana Amass

## 2017-09-14 ENCOUNTER — Encounter (HOSPITAL_COMMUNITY): Payer: Self-pay | Admitting: Surgery

## 2017-09-14 DIAGNOSIS — E669 Obesity, unspecified: Secondary | ICD-10-CM | POA: Diagnosis not present

## 2017-09-15 ENCOUNTER — Telehealth: Payer: Self-pay | Admitting: Family Medicine

## 2017-09-15 DIAGNOSIS — E669 Obesity, unspecified: Secondary | ICD-10-CM | POA: Diagnosis not present

## 2017-09-15 NOTE — Telephone Encounter (Signed)
Called patient to see how she is doing after her surgery on 7/9.  She reports she has some mild discomfort in the area but is otherwise feeling well.  She has an appt set up with me on 7/15.  I will check her wound and follow up with her at that time.

## 2017-09-16 DIAGNOSIS — E669 Obesity, unspecified: Secondary | ICD-10-CM | POA: Diagnosis not present

## 2017-09-17 DIAGNOSIS — E669 Obesity, unspecified: Secondary | ICD-10-CM | POA: Diagnosis not present

## 2017-09-18 DIAGNOSIS — E669 Obesity, unspecified: Secondary | ICD-10-CM | POA: Diagnosis not present

## 2017-09-19 ENCOUNTER — Ambulatory Visit (INDEPENDENT_AMBULATORY_CARE_PROVIDER_SITE_OTHER): Payer: Medicaid Other | Admitting: Family Medicine

## 2017-09-19 ENCOUNTER — Encounter: Payer: Self-pay | Admitting: Family Medicine

## 2017-09-19 DIAGNOSIS — E669 Obesity, unspecified: Secondary | ICD-10-CM | POA: Diagnosis not present

## 2017-09-19 DIAGNOSIS — Z87891 Personal history of nicotine dependence: Secondary | ICD-10-CM

## 2017-09-19 DIAGNOSIS — I890 Lymphedema, not elsewhere classified: Secondary | ICD-10-CM | POA: Diagnosis not present

## 2017-09-19 MED ORDER — OXYCODONE-ACETAMINOPHEN 5-325 MG PO TABS: 1.0000 | ORAL_TABLET | Freq: Four times a day (QID) | ORAL | 0 refills | Status: DC | PRN

## 2017-09-19 MED ORDER — SPIRIVA HANDIHALER 18 MCG IN CAPS: 18.0000 ug | ORAL_CAPSULE | Freq: Every day | RESPIRATORY_TRACT | 11 refills | Status: DC | PRN

## 2017-09-19 MED ORDER — NICOTINE 7 MG/24HR TD PT24: 7.0000 mg | MEDICATED_PATCH | Freq: Every day | TRANSDERMAL | 1 refills | Status: DC

## 2017-09-19 MED ORDER — ALBUTEROL SULFATE HFA 108 (90 BASE) MCG/ACT IN AERS: 2.0000 | INHALATION_SPRAY | RESPIRATORY_TRACT | 5 refills | Status: DC | PRN

## 2017-09-19 NOTE — Patient Instructions (Addendum)
It was nice seeing you again.  You came to clinic for leg swelling and for assistance with quitting tobacco use.  We decided to try the nicotine patches for smoking cessation instead of Chantix and I have prescribed these for you.  I am including some information below that may help.    Additionally for your leg swelling, I have checked your surgery incision which looks clean and appears to be healing well.  It is possible that surgery can sometimes cause worsening lymphedema, however I would recommend getting a doppler ultrasound to make sure this is not due to a blood clot. It is certainly ok to monitor this as you requested, however I would strongly advise going to the ER if you do have worsening redness, swelling or pain.  Please call clinic if you have any questions.   Be well, Lovenia Kim MD    Nicotine skin patches What is this medicine? NICOTINE ([place] teen) helps people stop smoking. The patches replace the nicotine found in cigarettes and help to decrease withdrawal effects. They are most effective when used in combination with a stop-smoking program. This medicine may be used for other purposes; ask your health care provider or pharmacist if you have questions. COMMON BRAND NAME(S): Habitrol, Nicoderm CQ, Nicotrol What should I tell my health care provider before I take this medicine? They need to know if you have any of these conditions: -diabetes -heart disease, angina, irregular heartbeat or previous heart attack -high blood pressure -lung disease, including asthma -overactive thyroid -pheochromocytoma -seizures or a history of seizures -skin problems, like eczema -stomach problems or ulcers -an unusual or allergic reaction to nicotine, adhesives, other medicines, foods, dyes, or preservatives -pregnant or trying to get pregnant -breast-feeding How should I use this medicine? This medicine is for use on the skin. Follow the directions that come with the patches. Find an  area of skin on your upper arm, chest, or back that is clean, dry, greaseless, undamaged and hairless. Wash hands with plain soap and water. Do not use anything that contains aloe, lanolin or glycerin as these may prevent the patch from sticking. Dry thoroughly. Remove the patch from the sealed pouch. Do not try to cut or trim the patch. Using your palm, press the patch firmly in place for 10 seconds to make sure that there is good contact with your skin. After applying the patch, wash your hands. Change the patch every day, keeping to a regular schedule. When you apply a new patch, use a new area of skin. Wait at least 1 week before using the same area again. Talk to your pediatrician regarding the use of this medicine in children. Special care may be needed. Overdosage: If you think you have taken too much of this medicine contact a poison control center or emergency room at once. NOTE: This medicine is only for you. Do not share this medicine with others. What if I miss a dose? If you forget to replace a patch, use it as soon as you can. Only use one patch at a time and do not leave on the skin for longer than directed. If a patch falls off, you can replace it, but keep to your schedule and remove the patch at the right time. What may interact with this medicine? -medicines for asthma -medicines for blood pressure -medicines for mental depression This list may not describe all possible interactions. Give your health care provider a list of all the medicines, herbs, non-prescription drugs, or  dietary supplements you use. Also tell them if you smoke, drink alcohol, or use illegal drugs. Some items may interact with your medicine. What should I watch for while using this medicine? You should begin using the nicotine patch the day you stop smoking. It is okay if you do not succeed at your attempt to quit and have a cigarette. You can still continue your quit attempt and keep using the product as directed.  Just throw away your cigarettes and get back to your quit plan. You can keep the patch in place during swimming, bathing, and showering. If your patch falls off during these activities, replace it. When you first apply the patch, your skin may itch or burn. This should go away soon. When you remove a patch, the skin may look red, but this should only last for a few days. Call your doctor or health care professional if skin redness does not go away after 4 days, if your skin swells, or if you get a rash. If you are a diabetic and you quit smoking, the effects of insulin may be increased and you may need to reduce your insulin dose. Check with your doctor or health care professional about how you should adjust your insulin dose. If you are going to have a magnetic resonance imaging (MRI) procedure, tell your MRI technician if you have this patch on your body. It must be removed before a MRI. What side effects may I notice from receiving this medicine? Side effects that you should report to your doctor or health care professional as soon as possible: -allergic reactions like skin rash, itching or hives, swelling of the face, lips, or tongue -breathing problems -changes in hearing -changes in vision -chest pain -cold sweats -confusion -fast, irregular heartbeat -feeling faint or lightheaded, falls -headache -increased saliva -skin redness that lasts more than 4 days -stomach pain -signs and symptoms of nicotine overdose like nausea; vomiting; dizziness; weakness; and rapid heartbeat Side effects that usually do not require medical attention (report to your doctor or health care professional if they continue or are bothersome): -diarrhea -dry mouth -hiccups -irritability -nervousness or restlessness -trouble sleeping or vivid dreams This list may not describe all possible side effects. Call your doctor for medical advice about side effects. You may report side effects to FDA at  1-800-FDA-1088. Where should I keep my medicine? Keep out of the reach of children. Store at room temperature between 20 and 25 degrees C (68 and 77 degrees F). Protect from heat and light. Store in International aid/development worker until ready to use. Throw away unused medicine after the expiration date. When you remove a patch, fold with sticky sides together; put in an empty opened pouch and throw away. NOTE: This sheet is a summary. It may not cover all possible information. If you have questions about this medicine, talk to your doctor, pharmacist, or health care provider.  2018 Elsevier/Gold Standard (2014-01-21 15:46:21)

## 2017-09-19 NOTE — Progress Notes (Signed)
Subjective:   Patient ID: [name]    DOB: 1965-12-15, 52 y.o. female   MRN: [id number]  CC: leg swelling  HPI: [name] is a 52 y.o. female who presents to clinic today for leg swelling and medication refill.   Lymphedema Chronic problem.  She reports this flares up anytime she has a procedure done.  She is s/p surgery on 09/13/17 for hidradenitis suppurativa and is otherwise doing well.  She has been taking her Percocet every 3 hours.  Notes bilateral leg swelling, has not been using her compression stockings.  No fever, chills, nausea, vomiting.  Has not noticed calf tenderness or erythema.  No numbness or tingling in lower extremities.   Tobacco cessation Feels like shes "losing her grip" and starting to smoke more.  She currently smokes about 1 pack per week and does not feel Chantix is working for her.  She is interested in trying a patch instead.   ROS: No fever, chills, nausea, vomiting.  No CP, SOB.   Social: pt is a current smoker  Medications reviewed. Objective:   BP 118/80 (BP Location: Left Arm, Patient Position: Sitting, Cuff Size: Normal)   Pulse 60   Temp 98.6 F (37 C) (Oral)   Ht 5\' 8"  (1.727 m)   Wt 248 lb 12.8 oz (112.9 kg)   LMP 08/30/2017 (Approximate)   SpO2 100%   BMI 37.83 kg/m  Vitals and nursing note reviewed.  General: 52 yo female, NAD  CV: regular rate and rhythm without murmurs rubs or gallops Lungs: clear to auscultation bilaterally with normal work of breathing Abdomen: soft, NTND, +bs  Skin: warm, dry, no rashes or lesions  Extremities: warm, well perfused, 1+ pitting edema bilaterally to mid calves, no erythema, mild tenderness to palpation, negative Homan's sign   Assessment & Plan:   History of tobacco use Feels Chantix is not working for her, has been smoking daily this past week.  Does not seem related to anxiety.  Interested in nicotine patches instead.   -Rx: nicotine patch, information provided  -f/u if no improvement     Lymphedema Appears to have flared up.  Pt has been taking percocet every 3 hours rather than q6 prn. On exam, LLE does appear to be slightly more swollen than right, however hard to tell given size of her legs.  Negative Homan's sign and no erythema noted.  Given calf tenderness on exam, would recommend doppler to r/o DVT.  She declines going to the ED at this time.  Advised her if worsening she will need to be seen, pt expresses good understanding.  -advised to space out her pain medications as this can be managed without -strict return precautions reviewed    Meds ordered this encounter  Medications  . SPIRIVA HANDIHALER 18 MCG inhalation capsule    Sig: Place 1 capsule (18 mcg total) into inhaler and inhale daily as needed.    Dispense:  30 capsule    Refill:  11  . albuterol (PROAIR HFA) 108 (90 Base) MCG/ACT inhaler    Sig: Inhale 2 puffs into the lungs every 4 (four) hours as needed for shortness of breath.    Dispense:  18 g    Refill:  5  . DISCONTD: oxyCODONE-acetaminophen (PERCOCET/ROXICET) 5-325 MG tablet    Sig: Take 1 tablet by mouth every 6 (six) hours as needed for severe pain.    Dispense:  84 tablet    Refill:  0  . nicotine (NICODERM  CQ - DOSED IN MG/24 HR) 7 mg/24hr patch    Sig: Place 1 patch (7 mg total) onto the skin daily.    Dispense:  28 patch    Refill:  1    Lovenia Kim, MD [place], PGY-3

## 2017-09-20 ENCOUNTER — Telehealth: Payer: Self-pay | Admitting: Family Medicine

## 2017-09-20 DIAGNOSIS — E669 Obesity, unspecified: Secondary | ICD-10-CM | POA: Diagnosis not present

## 2017-09-20 NOTE — Telephone Encounter (Signed)
Pt had appt with Dr Reesa Chew yesterday. Pt wants to know if dr wants her to continue to take her pain med( percocet) every 3 hrs.  If so, will she change the quality to accommodate the change in the how many she takes per day?  Medicaid doesn't cover Chantix patches. Could she call something else in??

## 2017-09-21 ENCOUNTER — Other Ambulatory Visit: Payer: Self-pay | Admitting: Family Medicine

## 2017-09-21 DIAGNOSIS — E669 Obesity, unspecified: Secondary | ICD-10-CM | POA: Diagnosis not present

## 2017-09-21 MED ORDER — OXYCODONE-ACETAMINOPHEN 10-325 MG PO TABS: 1.0000 | ORAL_TABLET | Freq: Four times a day (QID) | ORAL | 0 refills | Status: DC | PRN

## 2017-09-21 NOTE — Telephone Encounter (Signed)
2nd request for quant change. Pt also wants to know why 5mg  tabs were called in and not her usual 10mg  tabs.

## 2017-09-21 NOTE — Telephone Encounter (Signed)
Discussed patches with Dr. Gwendlyn Deutscher.  Medicaid should be able to cover her nicotine patches however if not, would recommend getting these OTC as she does not wish to continue Chantix and would like to try the patches.  She may take the percocet every 3 hours for 2-3 days, however I would advise against this increased dose as she is several days out from her surgery and pain should be getting better.  I will adjust the percocet Rx to reflect 10-325 mg.  Please inform pt.

## 2017-09-21 NOTE — Telephone Encounter (Signed)
Called patient and clarified with her the dosage for percocet per Dr. Reesa Chew. Patient understood and said that pain is diminishing and there is still redness and she is walking better.  [name], [place]

## 2017-09-22 DIAGNOSIS — E669 Obesity, unspecified: Secondary | ICD-10-CM | POA: Diagnosis not present

## 2017-09-23 DIAGNOSIS — E669 Obesity, unspecified: Secondary | ICD-10-CM | POA: Diagnosis not present

## 2017-09-24 DIAGNOSIS — E669 Obesity, unspecified: Secondary | ICD-10-CM | POA: Diagnosis not present

## 2017-09-25 DIAGNOSIS — E669 Obesity, unspecified: Secondary | ICD-10-CM | POA: Diagnosis not present

## 2017-09-26 ENCOUNTER — Telehealth: Payer: Self-pay | Admitting: Family Medicine

## 2017-09-26 ENCOUNTER — Other Ambulatory Visit: Payer: Self-pay | Admitting: Family Medicine

## 2017-09-26 DIAGNOSIS — E669 Obesity, unspecified: Secondary | ICD-10-CM | POA: Diagnosis not present

## 2017-09-26 MED ORDER — SULFAMETHOXAZOLE-TRIMETHOPRIM 800-160 MG PO TABS: 1.0000 | ORAL_TABLET | Freq: Two times a day (BID) | ORAL | 0 refills | Status: DC

## 2017-09-26 NOTE — Assessment & Plan Note (Signed)
Appears to have flared up.  Pt has been taking percocet every 3 hours rather than q6 prn. On exam, LLE does appear to be slightly more swollen than right, however hard to tell given size of her legs.  Negative Homan's sign and no erythema noted.  Given calf tenderness on exam, would recommend doppler to r/o DVT.  She declines going to the ED at this time.  Advised her if worsening she will need to be seen, pt expresses good understanding.  -advised to space out her pain medications as this can be managed without -strict return precautions reviewed

## 2017-09-26 NOTE — Telephone Encounter (Signed)
Have called in some Bactrim for her, please inform pt.

## 2017-09-26 NOTE — Assessment & Plan Note (Signed)
Feels Chantix is not working for her, has been smoking daily this past week.  Does not seem related to anxiety.  Interested in nicotine patches instead.   -Rx: nicotine patch, information provided  -f/u if no improvement

## 2017-09-26 NOTE — Telephone Encounter (Signed)
Pt is requesting a refill on the antibiotic that she was given for her boil she had from her sweat gland. She said she has had the sweat gland removed but she said it's swelling up again like the boil is coming back. Please call pt when this has been done or to discuss this.

## 2017-09-27 DIAGNOSIS — E669 Obesity, unspecified: Secondary | ICD-10-CM | POA: Diagnosis not present

## 2017-09-28 DIAGNOSIS — E669 Obesity, unspecified: Secondary | ICD-10-CM | POA: Diagnosis not present

## 2017-09-29 DIAGNOSIS — E669 Obesity, unspecified: Secondary | ICD-10-CM | POA: Diagnosis not present

## 2017-09-30 DIAGNOSIS — E669 Obesity, unspecified: Secondary | ICD-10-CM | POA: Diagnosis not present

## 2017-10-01 DIAGNOSIS — E669 Obesity, unspecified: Secondary | ICD-10-CM | POA: Diagnosis not present

## 2017-10-02 DIAGNOSIS — E669 Obesity, unspecified: Secondary | ICD-10-CM | POA: Diagnosis not present

## 2017-10-03 DIAGNOSIS — E669 Obesity, unspecified: Secondary | ICD-10-CM | POA: Diagnosis not present

## 2017-10-04 DIAGNOSIS — E669 Obesity, unspecified: Secondary | ICD-10-CM | POA: Diagnosis not present

## 2017-10-05 DIAGNOSIS — E669 Obesity, unspecified: Secondary | ICD-10-CM | POA: Diagnosis not present

## 2017-10-13 DIAGNOSIS — E669 Obesity, unspecified: Secondary | ICD-10-CM | POA: Diagnosis not present

## 2017-10-14 DIAGNOSIS — E669 Obesity, unspecified: Secondary | ICD-10-CM | POA: Diagnosis not present

## 2017-10-15 DIAGNOSIS — E669 Obesity, unspecified: Secondary | ICD-10-CM | POA: Diagnosis not present

## 2017-10-16 DIAGNOSIS — E669 Obesity, unspecified: Secondary | ICD-10-CM | POA: Diagnosis not present

## 2017-10-17 DIAGNOSIS — E669 Obesity, unspecified: Secondary | ICD-10-CM | POA: Diagnosis not present

## 2017-10-18 ENCOUNTER — Encounter: Payer: Self-pay | Admitting: Family Medicine

## 2017-10-18 ENCOUNTER — Other Ambulatory Visit: Payer: Self-pay

## 2017-10-18 ENCOUNTER — Ambulatory Visit (INDEPENDENT_AMBULATORY_CARE_PROVIDER_SITE_OTHER): Payer: Medicaid Other | Admitting: Family Medicine

## 2017-10-18 DIAGNOSIS — I890 Lymphedema, not elsewhere classified: Secondary | ICD-10-CM | POA: Diagnosis not present

## 2017-10-18 DIAGNOSIS — L732 Hidradenitis suppurativa: Secondary | ICD-10-CM

## 2017-10-18 DIAGNOSIS — E669 Obesity, unspecified: Secondary | ICD-10-CM | POA: Diagnosis not present

## 2017-10-18 MED ORDER — ALPRAZOLAM 0.5 MG PO TABS: 0.5000 mg | ORAL_TABLET | Freq: Every evening | ORAL | 0 refills | Status: DC | PRN

## 2017-10-18 MED ORDER — OXYCODONE-ACETAMINOPHEN 10-325 MG PO TABS: 1.0000 | ORAL_TABLET | Freq: Four times a day (QID) | ORAL | 0 refills | Status: DC | PRN

## 2017-10-18 MED ORDER — QUETIAPINE FUMARATE 50 MG PO TABS: 50.0000 mg | ORAL_TABLET | Freq: Every day | ORAL | 3 refills | Status: DC

## 2017-10-18 MED ORDER — SULFAMETHOXAZOLE-TRIMETHOPRIM 800-160 MG PO TABS: 1.0000 | ORAL_TABLET | Freq: Two times a day (BID) | ORAL | 0 refills | Status: DC

## 2017-10-18 MED ORDER — FUROSEMIDE 40 MG PO TABS: 40.0000 mg | ORAL_TABLET | Freq: Every day | ORAL | 0 refills | Status: DC

## 2017-10-18 NOTE — Progress Notes (Signed)
Subjective:   Patient ID: [name]    DOB: January 01, 1966, 52 y.o. female   MRN: [id number]  CC: f/u hidradenitis   HPI: [name] is a 52 y.o. female who presents to clinic today for the following issue.  Recurrent hidradenitis Reports 3-4 days of increasing pain over a boil in her right groin.  S/p excision of right groin hidradenitis measuring 4 x 1.5cm on July 9th.  She has been prescribed a course of Bactrim recently for recurrence which she only took 3 days of and stopped because she felt better.  No reported fever, chills, nausea or vomiting.  She endorses tenderness over the boil and was initially about the size of a pea, now is about double the size.  She states she has been wearing loose fitting underwear and pants.    Chronic lymphedema Has had this since childhood. She has a hard time pulling pants over her legs and putting shoes on due to the swelling. Had been on lasix in the past for leg swelling.  She would like to try something for this that could help.  No calf tenderness.  No numbness or tingling.    ROS: See HPI for pertinent ROS.     Social: pt is a current occasional smoker.   Medications reviewed. Objective:   BP 124/80   Pulse 69   Temp 98.4 F (36.9 C) (Oral)   Ht 5\' 8"  (1.727 m)   Wt 254 lb 3.2 oz (115.3 kg)   SpO2 99%   BMI 38.65 kg/m  Vitals and nursing note reviewed.   General:  52 yo female, NAD   HEENT: NCAT, EOMI, PERRLA  Neck: supple, non-tender, normal ROM, no LAD   CV: RRR no MRG  Lungs: CTAB, normal effort  Abdomen: soft, +bs  Skin: warm, dry  Extremities: warm and well perfused, chronic lymphedema of bilateral lower extremities  Neuro: alert, oriented x3, no focal deficits   Assessment & Plan:   Lymphedema Chronic, bilateral and stable.  Would recommend lasix to help with her recent swelling.  Patient did not present to ED for w/u of DVT last visit.  Discussed signs and symptoms of this and she denies any calf tenderness,  erythema or unilateral swelling.   -will trial lasix 40 mg daily x 1 week, may increase to BID if good response. -recommend compression stockings  - Strict return precaution discussed.   HIDRADENITIS SUPPURATIVA S/p I&D on 09/13/17. The boil has recurred and is tender to palpation, and is about the size of a pea.  Will treat with course of bactrim as doxycycline has previously not provided much improvement in symptoms. No fever or systemic symptoms present, no red flags on exam.  -discussed wearing loose fitting clothing -return precautions discussed   Meds ordered this encounter  Medications  . oxyCODONE-acetaminophen (PERCOCET) 10-325 MG tablet    Sig: Take 1 tablet by mouth every 6 (six) hours as needed for pain.    Dispense:  84 tablet    Refill:  0  . QUEtiapine (SEROQUEL) 50 MG tablet    Sig: Take 1 tablet (50 mg total) by mouth at bedtime.    Dispense:  30 tablet    Refill:  3  . furosemide (LASIX) 40 MG tablet    Sig: Take 1 tablet (40 mg total) by mouth daily.    Dispense:  30 tablet    Refill:  0  . sulfamethoxazole-trimethoprim (BACTRIM DS,SEPTRA DS) 800-160 MG tablet  Sig: Take 1 tablet by mouth 2 (two) times daily.    Dispense:  14 tablet    Refill:  0  . ALPRAZolam (XANAX) 0.5 MG tablet    Sig: Take 1 tablet (0.5 mg total) by mouth at bedtime as needed for anxiety.    Dispense:  4 tablet    Refill:  0   Lovenia Kim, MD [place], PGY-3 10/24/2017 9:31 AM

## 2017-10-18 NOTE — Patient Instructions (Addendum)
I am starting you on Lasix which is a fluid pill.  I would like you to take 40 mg daily which will help with your leg swelling.  If you are not peeing a lot on this dose, I would like you to increase to 40 mg twice daily.  You can take 1 pill in the morning and 1 at night.  For your recurrent boils, I have prescribed you another course of antibiotics.  As we discussed, I would also recommend wearing loosefitting pants and using a chlorhexidine wash to clean the skin.  Please call clinic if any questions.  Lovenia Kim MD

## 2017-10-19 DIAGNOSIS — E669 Obesity, unspecified: Secondary | ICD-10-CM | POA: Diagnosis not present

## 2017-10-20 DIAGNOSIS — E669 Obesity, unspecified: Secondary | ICD-10-CM | POA: Diagnosis not present

## 2017-10-21 DIAGNOSIS — E669 Obesity, unspecified: Secondary | ICD-10-CM | POA: Diagnosis not present

## 2017-10-22 DIAGNOSIS — E669 Obesity, unspecified: Secondary | ICD-10-CM | POA: Diagnosis not present

## 2017-10-23 DIAGNOSIS — E669 Obesity, unspecified: Secondary | ICD-10-CM | POA: Diagnosis not present

## 2017-10-24 DIAGNOSIS — E669 Obesity, unspecified: Secondary | ICD-10-CM | POA: Diagnosis not present

## 2017-10-24 NOTE — Assessment & Plan Note (Signed)
S/p I&D on 09/13/17. The boil has recurred and is tender to palpation, and is about the size of a pea.  Will treat with course of bactrim as doxycycline has previously not provided much improvement in symptoms. No fever or systemic symptoms present, no red flags on exam.  -discussed wearing loose fitting clothing -return precautions discussed

## 2017-10-24 NOTE — Assessment & Plan Note (Addendum)
Chronic, bilateral and stable.  Would recommend lasix to help with her recent swelling.  Patient did not present to ED for w/u of DVT last visit.  Discussed signs and symptoms of this and she denies any calf tenderness, erythema or unilateral swelling.   -will trial lasix 40 mg daily x 1 week, may increase to BID if good response. -recommend compression stockings  - Strict return precaution discussed.

## 2017-10-25 DIAGNOSIS — E669 Obesity, unspecified: Secondary | ICD-10-CM | POA: Diagnosis not present

## 2017-10-26 DIAGNOSIS — E669 Obesity, unspecified: Secondary | ICD-10-CM | POA: Diagnosis not present

## 2017-10-27 DIAGNOSIS — E669 Obesity, unspecified: Secondary | ICD-10-CM | POA: Diagnosis not present

## 2017-10-28 DIAGNOSIS — E669 Obesity, unspecified: Secondary | ICD-10-CM | POA: Diagnosis not present

## 2017-10-29 DIAGNOSIS — E669 Obesity, unspecified: Secondary | ICD-10-CM | POA: Diagnosis not present

## 2017-10-30 DIAGNOSIS — E669 Obesity, unspecified: Secondary | ICD-10-CM | POA: Diagnosis not present

## 2017-10-31 DIAGNOSIS — E669 Obesity, unspecified: Secondary | ICD-10-CM | POA: Diagnosis not present

## 2017-11-01 DIAGNOSIS — E669 Obesity, unspecified: Secondary | ICD-10-CM | POA: Diagnosis not present

## 2017-11-02 DIAGNOSIS — E669 Obesity, unspecified: Secondary | ICD-10-CM | POA: Diagnosis not present

## 2017-11-03 DIAGNOSIS — E669 Obesity, unspecified: Secondary | ICD-10-CM | POA: Diagnosis not present

## 2017-11-04 DIAGNOSIS — E669 Obesity, unspecified: Secondary | ICD-10-CM | POA: Diagnosis not present

## 2017-11-05 DIAGNOSIS — E669 Obesity, unspecified: Secondary | ICD-10-CM | POA: Diagnosis not present

## 2017-11-06 DIAGNOSIS — E669 Obesity, unspecified: Secondary | ICD-10-CM | POA: Diagnosis not present

## 2017-11-07 DIAGNOSIS — E669 Obesity, unspecified: Secondary | ICD-10-CM | POA: Diagnosis not present

## 2017-11-08 DIAGNOSIS — E669 Obesity, unspecified: Secondary | ICD-10-CM | POA: Diagnosis not present

## 2017-11-09 DIAGNOSIS — E669 Obesity, unspecified: Secondary | ICD-10-CM | POA: Diagnosis not present

## 2017-11-10 DIAGNOSIS — E669 Obesity, unspecified: Secondary | ICD-10-CM | POA: Diagnosis not present

## 2017-11-11 DIAGNOSIS — E669 Obesity, unspecified: Secondary | ICD-10-CM | POA: Diagnosis not present

## 2017-11-12 DIAGNOSIS — E669 Obesity, unspecified: Secondary | ICD-10-CM | POA: Diagnosis not present

## 2017-11-13 DIAGNOSIS — E669 Obesity, unspecified: Secondary | ICD-10-CM | POA: Diagnosis not present

## 2017-11-14 DIAGNOSIS — E669 Obesity, unspecified: Secondary | ICD-10-CM | POA: Diagnosis not present

## 2017-11-15 DIAGNOSIS — E669 Obesity, unspecified: Secondary | ICD-10-CM | POA: Diagnosis not present

## 2017-11-16 DIAGNOSIS — E669 Obesity, unspecified: Secondary | ICD-10-CM | POA: Diagnosis not present

## 2017-11-17 ENCOUNTER — Other Ambulatory Visit: Payer: Self-pay

## 2017-11-17 ENCOUNTER — Encounter: Payer: Self-pay | Admitting: Family Medicine

## 2017-11-17 ENCOUNTER — Ambulatory Visit (INDEPENDENT_AMBULATORY_CARE_PROVIDER_SITE_OTHER): Payer: Medicaid Other | Admitting: Family Medicine

## 2017-11-17 VITALS — BP 122/82 | HR 61 | Temp 98.4°F | Wt 262.8 lb

## 2017-11-17 DIAGNOSIS — E039 Hypothyroidism, unspecified: Secondary | ICD-10-CM | POA: Diagnosis not present

## 2017-11-17 DIAGNOSIS — F419 Anxiety disorder, unspecified: Secondary | ICD-10-CM

## 2017-11-17 DIAGNOSIS — E038 Other specified hypothyroidism: Secondary | ICD-10-CM

## 2017-11-17 DIAGNOSIS — E669 Obesity, unspecified: Secondary | ICD-10-CM | POA: Diagnosis not present

## 2017-11-17 DIAGNOSIS — E890 Postprocedural hypothyroidism: Secondary | ICD-10-CM

## 2017-11-17 DIAGNOSIS — F329 Major depressive disorder, single episode, unspecified: Secondary | ICD-10-CM | POA: Diagnosis not present

## 2017-11-17 DIAGNOSIS — E785 Hyperlipidemia, unspecified: Secondary | ICD-10-CM

## 2017-11-17 DIAGNOSIS — I890 Lymphedema, not elsewhere classified: Secondary | ICD-10-CM

## 2017-11-17 MED ORDER — PRAVASTATIN SODIUM 20 MG PO TABS: 20.0000 mg | ORAL_TABLET | Freq: Every day | ORAL | 3 refills | Status: DC

## 2017-11-17 MED ORDER — LYRICA 150 MG PO CAPS: 150.0000 mg | ORAL_CAPSULE | Freq: Two times a day (BID) | ORAL | 3 refills | Status: DC

## 2017-11-17 MED ORDER — QUETIAPINE FUMARATE 50 MG PO TABS: 50.0000 mg | ORAL_TABLET | Freq: Every day | ORAL | 3 refills | Status: DC

## 2017-11-17 MED ORDER — ALPRAZOLAM 0.5 MG PO TABS: 0.5000 mg | ORAL_TABLET | Freq: Every evening | ORAL | 0 refills | Status: DC | PRN

## 2017-11-17 MED ORDER — ONDANSETRON HCL 4 MG PO TABS: 4.0000 mg | ORAL_TABLET | Freq: Three times a day (TID) | ORAL | 0 refills | Status: DC | PRN

## 2017-11-17 MED ORDER — OXYCODONE-ACETAMINOPHEN 10-325 MG PO TABS: 1.0000 | ORAL_TABLET | Freq: Four times a day (QID) | ORAL | 0 refills | Status: DC | PRN

## 2017-11-17 MED ORDER — LEVOTHYROXINE SODIUM 175 MCG PO TABS: 175.0000 ug | ORAL_TABLET | Freq: Every day | ORAL | 3 refills | Status: DC

## 2017-11-17 MED ORDER — MELOXICAM 7.5 MG PO TABS: 7.5000 mg | ORAL_TABLET | Freq: Every day | ORAL | 2 refills | Status: DC | PRN

## 2017-11-17 NOTE — Progress Notes (Signed)
Subjective:   Patient ID: [name]    DOB: 11/26/1965, 52 y.o. female   MRN: [id number]  CC: labs, f/u hypothyroidism, f/u leg swelling  HPI: [name] is a 52 y.o. female who presents to clinic today for the following issues.  Hyperlipidemia Takes pravastatin 20 mg daily with good compliance.  She is due for blood work as last check was in 2018 with LDL 143.   Hypothyroidism Feels well however sometimes feels more tired, takes Synthroid 175 mcg daily with good compliance. Denies recent weight changes, hair or skin dryness,  Last TSH 04/2017 was normal. She would like to recheck this today to make sure she is on the correct dose.    Leg swelling  Has h/o chronic lymphedema, was prescribed 40 mg Lasix daily which has been helping take some of the fluid off.  Reports frequent urination on this dose.  Leg swelling has overall improved and she now has less pain.  No CP, SOB, palpitations.   Anxiety Becomes stressed annually around her mother's anniversary in November.  She is taking xanax for situational anxiety which has worked well for her.  She requests a refill of this today.   ROS: See HPI for pertinent ROS.  Social: pt is a current daily smoker, trying to quit.  Medications reviewed. Objective:   BP 122/82   Pulse 61   Temp 98.4 F (36.9 C) (Oral)   Wt 262 lb 12.8 oz (119.2 kg)   SpO2 97%   BMI 39.96 kg/m  Vitals and nursing note reviewed.  General: 52 yo female, NAD  HEENT: NCAT, EOMI, PERRL, MMM, oropharynx clear Neck: supple, non-tender, normal ROM, no LAD  CV: RRR no MRG  Lungs: CTAB, normal effort  Abdomen: soft, NTND, +bs  Skin: warm, dry, no rash Extremities: warm and well perfused, 1-2+ pitting chronic edema bilaterally, normal tone Neuro: alert, oriented x3, no focal deficits   Assessment & Plan:   Hypothyroidism Reports feeling tired more lately, last TSH normal.  No cold intolerance, weight changes. Good compliance on 175 mcg Synthroid daily.    -recheck TSH   Anxiety and depression Situational surround anniversary of her mother's death.  Prn Xanax refilled, #4 tabs, no refill  Hyperlipidemia Good compliance with Pravastatin daily. She would like to recheck her cholesterol today. Recheck lipid panel today.   Lymphedema Good response to Lasix 40 mg daily with improvement in leg pain and swelling.  May continue lasix for now.   Orders Placed This Encounter  Procedures  . TSH  . Lipid panel   Meds ordered this encounter  Medications  . pravastatin (PRAVACHOL) 20 MG tablet    Sig: Take 1 tablet (20 mg total) by mouth daily.    Dispense:  90 tablet    Refill:  3  . levothyroxine (SYNTHROID, LEVOTHROID) 175 MCG tablet    Sig: Take 1 tablet (175 mcg total) by mouth daily.    Dispense:  90 tablet    Refill:  3  . meloxicam (MOBIC) 7.5 MG tablet    Sig: Take 1 tablet (7.5 mg total) by mouth daily as needed for pain.    Dispense:  90 tablet    Refill:  2  . oxyCODONE-acetaminophen (PERCOCET) 10-325 MG tablet    Sig: Take 1 tablet by mouth every 6 (six) hours as needed for pain.    Dispense:  84 tablet    Refill:  0  . ALPRAZolam (XANAX) 0.5 MG tablet  Sig: Take 1 tablet (0.5 mg total) by mouth at bedtime as needed for anxiety.    Dispense:  4 tablet    Refill:  0  . ondansetron (ZOFRAN) 4 MG tablet    Sig: Take 1 tablet (4 mg total) by mouth every 8 (eight) hours as needed for nausea or vomiting.    Dispense:  30 tablet    Refill:  0  . LYRICA 150 MG capsule    Sig: Take 1 capsule (150 mg total) by mouth 2 (two) times daily.    Dispense:  90 capsule    Refill:  3  . QUEtiapine (SEROQUEL) 50 MG tablet    Sig: Take 1 tablet (50 mg total) by mouth at bedtime.    Dispense:  30 tablet    Refill:  3    Lovenia Kim, MD [place], PGY-3 11/23/2017 7:44 PM

## 2017-11-17 NOTE — Patient Instructions (Signed)
It was nice seeing you again today!  You were seen in clinic for follow-up of your chronic leg pain, hypothyroidism, cholesterol and anxiety.  I have refilled your chronic medications and these are sent to your pharmacy.  We are rechecking your TSH and cholesterol today to make sure that these are within normal range.  I will call you once I have the results of these blood tests.  Please call clinic if you have any questions.  Lovenia Kim MD

## 2017-11-18 ENCOUNTER — Telehealth: Payer: Self-pay | Admitting: Family Medicine

## 2017-11-18 DIAGNOSIS — E669 Obesity, unspecified: Secondary | ICD-10-CM | POA: Diagnosis not present

## 2017-11-18 LAB — LIPID PANEL
CHOLESTEROL TOTAL: 274 mg/dL — AB (ref 100–199)
Chol/HDL Ratio: 4.3 ratio (ref 0.0–4.4)
HDL: 64 mg/dL (ref >39)
LDL Calculated: 171 mg/dL — ABNORMAL HIGH (ref 0–99)
TRIGLYCERIDES: 196 mg/dL — AB (ref 0–149)
VLDL Cholesterol Cal: 39 mg/dL (ref 5–40)

## 2017-11-18 LAB — TSH: TSH: 5.61 u[IU]/mL — AB (ref 0.450–4.500)

## 2017-11-18 NOTE — Telephone Encounter (Signed)
Pt was just seen by Dr. Reesa Chew yesterday 11/17/17 and would like to know if she would be able to drop off a handicap form to be filled out with out making another appointment. Pt said that she would call Monday and see if this is okay and she will bring the form.

## 2017-11-19 DIAGNOSIS — E669 Obesity, unspecified: Secondary | ICD-10-CM | POA: Diagnosis not present

## 2017-11-20 DIAGNOSIS — E669 Obesity, unspecified: Secondary | ICD-10-CM | POA: Diagnosis not present

## 2017-11-21 DIAGNOSIS — E669 Obesity, unspecified: Secondary | ICD-10-CM | POA: Diagnosis not present

## 2017-11-22 DIAGNOSIS — E669 Obesity, unspecified: Secondary | ICD-10-CM | POA: Diagnosis not present

## 2017-11-22 NOTE — Telephone Encounter (Signed)
You can have her drop off the form.  Please inform pt, thanks

## 2017-11-23 DIAGNOSIS — E669 Obesity, unspecified: Secondary | ICD-10-CM | POA: Diagnosis not present

## 2017-11-23 NOTE — Assessment & Plan Note (Addendum)
Good response to Lasix 40 mg daily with improvement in leg pain and swelling.  May continue lasix for now.

## 2017-11-23 NOTE — Assessment & Plan Note (Addendum)
Good compliance with Pravastatin daily. She would like to recheck her cholesterol today. Recheck lipid panel today.

## 2017-11-23 NOTE — Assessment & Plan Note (Addendum)
Situational surround anniversary of her mother's death.  Prn Xanax refilled, #4 tabs, no refill

## 2017-11-23 NOTE — Telephone Encounter (Signed)
Pt didn't have a form, will give pcp handicap placard form to fill out. Delsa Walder Kennon Holter, CMA

## 2017-11-23 NOTE — Assessment & Plan Note (Addendum)
Reports feeling tired more lately, last TSH normal.  No cold intolerance, weight changes. Good compliance on 175 mcg Synthroid daily.  -recheck TSH

## 2017-11-24 DIAGNOSIS — E669 Obesity, unspecified: Secondary | ICD-10-CM | POA: Diagnosis not present

## 2017-11-25 DIAGNOSIS — E669 Obesity, unspecified: Secondary | ICD-10-CM | POA: Diagnosis not present

## 2017-11-26 DIAGNOSIS — E669 Obesity, unspecified: Secondary | ICD-10-CM | POA: Diagnosis not present

## 2017-11-27 DIAGNOSIS — E669 Obesity, unspecified: Secondary | ICD-10-CM | POA: Diagnosis not present

## 2017-11-28 DIAGNOSIS — E669 Obesity, unspecified: Secondary | ICD-10-CM | POA: Diagnosis not present

## 2017-11-29 DIAGNOSIS — E669 Obesity, unspecified: Secondary | ICD-10-CM | POA: Diagnosis not present

## 2017-11-30 DIAGNOSIS — E669 Obesity, unspecified: Secondary | ICD-10-CM | POA: Diagnosis not present

## 2017-12-01 DIAGNOSIS — E669 Obesity, unspecified: Secondary | ICD-10-CM | POA: Diagnosis not present

## 2017-12-02 DIAGNOSIS — E669 Obesity, unspecified: Secondary | ICD-10-CM | POA: Diagnosis not present

## 2017-12-03 DIAGNOSIS — E669 Obesity, unspecified: Secondary | ICD-10-CM | POA: Diagnosis not present

## 2017-12-04 DIAGNOSIS — E669 Obesity, unspecified: Secondary | ICD-10-CM | POA: Diagnosis not present

## 2017-12-05 DIAGNOSIS — E669 Obesity, unspecified: Secondary | ICD-10-CM | POA: Diagnosis not present

## 2017-12-06 DIAGNOSIS — E669 Obesity, unspecified: Secondary | ICD-10-CM | POA: Diagnosis not present

## 2017-12-07 DIAGNOSIS — E669 Obesity, unspecified: Secondary | ICD-10-CM | POA: Diagnosis not present

## 2017-12-08 DIAGNOSIS — E669 Obesity, unspecified: Secondary | ICD-10-CM | POA: Diagnosis not present

## 2017-12-09 DIAGNOSIS — E669 Obesity, unspecified: Secondary | ICD-10-CM | POA: Diagnosis not present

## 2017-12-10 DIAGNOSIS — E669 Obesity, unspecified: Secondary | ICD-10-CM | POA: Diagnosis not present

## 2017-12-11 DIAGNOSIS — E669 Obesity, unspecified: Secondary | ICD-10-CM | POA: Diagnosis not present

## 2017-12-12 DIAGNOSIS — E669 Obesity, unspecified: Secondary | ICD-10-CM | POA: Diagnosis not present

## 2017-12-13 DIAGNOSIS — E669 Obesity, unspecified: Secondary | ICD-10-CM | POA: Diagnosis not present

## 2017-12-14 DIAGNOSIS — E669 Obesity, unspecified: Secondary | ICD-10-CM | POA: Diagnosis not present

## 2017-12-15 DIAGNOSIS — E669 Obesity, unspecified: Secondary | ICD-10-CM | POA: Diagnosis not present

## 2017-12-16 DIAGNOSIS — E669 Obesity, unspecified: Secondary | ICD-10-CM | POA: Diagnosis not present

## 2017-12-17 DIAGNOSIS — E669 Obesity, unspecified: Secondary | ICD-10-CM | POA: Diagnosis not present

## 2017-12-18 DIAGNOSIS — E669 Obesity, unspecified: Secondary | ICD-10-CM | POA: Diagnosis not present

## 2017-12-19 ENCOUNTER — Ambulatory Visit (INDEPENDENT_AMBULATORY_CARE_PROVIDER_SITE_OTHER): Payer: Medicaid Other | Admitting: Family Medicine

## 2017-12-19 ENCOUNTER — Encounter: Payer: Self-pay | Admitting: Family Medicine

## 2017-12-19 ENCOUNTER — Other Ambulatory Visit: Payer: Self-pay

## 2017-12-19 DIAGNOSIS — E890 Postprocedural hypothyroidism: Secondary | ICD-10-CM | POA: Diagnosis not present

## 2017-12-19 DIAGNOSIS — E038 Other specified hypothyroidism: Secondary | ICD-10-CM | POA: Diagnosis not present

## 2017-12-19 DIAGNOSIS — K219 Gastro-esophageal reflux disease without esophagitis: Secondary | ICD-10-CM

## 2017-12-19 DIAGNOSIS — E669 Obesity, unspecified: Secondary | ICD-10-CM | POA: Diagnosis not present

## 2017-12-19 MED ORDER — MELOXICAM 7.5 MG PO TABS: 7.5000 mg | ORAL_TABLET | Freq: Every day | ORAL | 2 refills | Status: DC | PRN

## 2017-12-19 MED ORDER — PRAVASTATIN SODIUM 20 MG PO TABS: 20.0000 mg | ORAL_TABLET | Freq: Every day | ORAL | 3 refills | Status: DC

## 2017-12-19 MED ORDER — OXYCODONE-ACETAMINOPHEN 10-325 MG PO TABS: 1.0000 | ORAL_TABLET | Freq: Four times a day (QID) | ORAL | 0 refills | Status: DC | PRN

## 2017-12-19 MED ORDER — PANTOPRAZOLE SODIUM 40 MG PO TBEC: 40.0000 mg | DELAYED_RELEASE_TABLET | Freq: Two times a day (BID) | ORAL | 10 refills | Status: DC

## 2017-12-19 MED ORDER — QUETIAPINE FUMARATE 50 MG PO TABS: 50.0000 mg | ORAL_TABLET | Freq: Every day | ORAL | 3 refills | Status: DC

## 2017-12-19 MED ORDER — LEVOTHYROXINE SODIUM 200 MCG PO TABS: 200.0000 ug | ORAL_TABLET | Freq: Every day | ORAL | 2 refills | Status: DC

## 2017-12-19 MED ORDER — ONDANSETRON HCL 4 MG PO TABS: 4.0000 mg | ORAL_TABLET | Freq: Three times a day (TID) | ORAL | 0 refills | Status: DC | PRN

## 2017-12-19 NOTE — Progress Notes (Signed)
   Subjective:   Patient ID: [name]    DOB: [birth year], 52 y.o. female   MRN: [id number]  CC: f/u hypothyroidism, reflux  HPI: [name] is a 52 y.o. female who presents to clinic today for the following issues.  Hypothyroidism Patient reports feeling tired and feels as though she has been gaining weight.  Taking synthroid 175 mcg . TSH checked in sept, she is following up for that today.  No dry skin or hair loss.  No new leg swelling.  Reflux Causing cough and hoarseness. Saw GI in May, did a balloon which helped, said esophagus was spasming.  Currently taking pantoprazole 40 mg BID.  Making an attempt at smoking cessation and is taking Chantix.    ROS: No fever, chills, nausea, vomiting.  No chest pain, shortness of breath.  Social: Patient is a current occasional smoker. Medications reviewed. Objective:   BP 112/72 (BP Location: Right Arm, Patient Position: Sitting, Cuff Size: Large)   Pulse 65   Temp 98.8 F (37.1 C) (Oral)   Ht 5\' 8"  (1.727 m)   Wt 263 lb (119.3 kg)   SpO2 97%   BMI 39.99 kg/m  Vitals and nursing note reviewed.  General: Pleasant 52 year old female, NAD HEENT: NCAT, EOMI, PERRL, MMM, oropharynx clear Neck: supple, non-tender, normal ROM, no LAD  CV: RRR no MRG  Lungs: CTAB, normal effort  Abdomen: soft, NTND, +bs  Skin: warm, dry, no rash Extremities: warm and well perfused, normal tone Neuro: alert, oriented x3, no focal deficits   Assessment & Plan:   GERD (gastroesophageal reflux disease) Chronic cough and hoarseness most likely related to chronic reflux.  Recommend continue pantoprazole and Chantix for smoking cessation.  Hypothyroidism TSH 5.6 in 11/2017.  In her current symptoms, trial increase Synthroid to 200 mcg daily. Recheck TSH in December   Meds ordered this encounter  Medications  . levothyroxine (SYNTHROID, LEVOTHROID) 200 MCG tablet    Sig: Take 1 tablet (200 mcg total) by mouth daily.    Dispense:  90 tablet   Refill:  2  . pantoprazole (PROTONIX) 40 MG tablet    Sig: Take 1 tablet (40 mg total) by mouth 2 (two) times daily before a meal.    Dispense:  60 tablet    Refill:  10    D/c rx for omeprazole  . oxyCODONE-acetaminophen (PERCOCET) 10-325 MG tablet    Sig: Take 1 tablet by mouth every 6 (six) hours as needed for pain.    Dispense:  84 tablet    Refill:  0  . QUEtiapine (SEROQUEL) 50 MG tablet    Sig: Take 1 tablet (50 mg total) by mouth at bedtime.    Dispense:  30 tablet    Refill:  3  . ondansetron (ZOFRAN) 4 MG tablet    Sig: Take 1 tablet (4 mg total) by mouth every 8 (eight) hours as needed for nausea or vomiting.    Dispense:  30 tablet    Refill:  0  . pravastatin (PRAVACHOL) 20 MG tablet    Sig: Take 1 tablet (20 mg total) by mouth daily.    Dispense:  90 tablet    Refill:  3  . meloxicam (MOBIC) 7.5 MG tablet    Sig: Take 1 tablet (7.5 mg total) by mouth daily as needed for pain.    Dispense:  90 tablet    Refill:  2   Follow-up: 1 month  Lovenia Kim, MD [place] [facility] PGY-3

## 2017-12-19 NOTE — Patient Instructions (Signed)
It was nice seeing you again today.  You were seen in clinic for follow-up of your thyroid and cough/hoarseness.  As we discussed, your TSH (or thyroid level) last month was a little elevated and I am increasing your Synthroid to 200 mcg.  We will repeat a TSH with blood work in 2 to 3 months.    Your cough and hoarseness is most likely related to your chronic reflux.  I would recommend continuing pantoprazole 40 mg twice daily as well as continuing Chantix for smoking cessation.  I have refilled these medications and sent these to your pharmacy.  Please call clinic if you have any questions.  Be well, Lovenia Kim MD

## 2017-12-20 DIAGNOSIS — E669 Obesity, unspecified: Secondary | ICD-10-CM | POA: Diagnosis not present

## 2017-12-21 DIAGNOSIS — E669 Obesity, unspecified: Secondary | ICD-10-CM | POA: Diagnosis not present

## 2017-12-22 DIAGNOSIS — E669 Obesity, unspecified: Secondary | ICD-10-CM | POA: Diagnosis not present

## 2017-12-23 DIAGNOSIS — E669 Obesity, unspecified: Secondary | ICD-10-CM | POA: Diagnosis not present

## 2017-12-24 DIAGNOSIS — E669 Obesity, unspecified: Secondary | ICD-10-CM | POA: Diagnosis not present

## 2017-12-25 DIAGNOSIS — E669 Obesity, unspecified: Secondary | ICD-10-CM | POA: Diagnosis not present

## 2017-12-26 DIAGNOSIS — E669 Obesity, unspecified: Secondary | ICD-10-CM | POA: Diagnosis not present

## 2017-12-26 NOTE — Assessment & Plan Note (Signed)
TSH 5.6 in 11/2017.  In her current symptoms, trial increase Synthroid to 200 mcg daily. Recheck TSH in December

## 2017-12-26 NOTE — Assessment & Plan Note (Signed)
Chronic cough and hoarseness most likely related to chronic reflux.  Recommend continue pantoprazole and Chantix for smoking cessation.

## 2017-12-27 DIAGNOSIS — E669 Obesity, unspecified: Secondary | ICD-10-CM | POA: Diagnosis not present

## 2017-12-28 DIAGNOSIS — E669 Obesity, unspecified: Secondary | ICD-10-CM | POA: Diagnosis not present

## 2017-12-29 DIAGNOSIS — E669 Obesity, unspecified: Secondary | ICD-10-CM | POA: Diagnosis not present

## 2017-12-30 DIAGNOSIS — E669 Obesity, unspecified: Secondary | ICD-10-CM | POA: Diagnosis not present

## 2017-12-31 DIAGNOSIS — E669 Obesity, unspecified: Secondary | ICD-10-CM | POA: Diagnosis not present

## 2018-01-01 DIAGNOSIS — E669 Obesity, unspecified: Secondary | ICD-10-CM | POA: Diagnosis not present

## 2018-01-02 ENCOUNTER — Telehealth: Payer: Self-pay | Admitting: Family Medicine

## 2018-01-02 DIAGNOSIS — E669 Obesity, unspecified: Secondary | ICD-10-CM | POA: Diagnosis not present

## 2018-01-02 NOTE — Telephone Encounter (Signed)
Pt would like Amin to give her a call concerning a knot she found on the backside of her leg. Please call pt back to discuss this.

## 2018-01-03 DIAGNOSIS — E669 Obesity, unspecified: Secondary | ICD-10-CM | POA: Diagnosis not present

## 2018-01-04 DIAGNOSIS — E669 Obesity, unspecified: Secondary | ICD-10-CM | POA: Diagnosis not present

## 2018-01-04 NOTE — Telephone Encounter (Signed)
Pt is calling to speak to Dr. Reesa Chew about fitting her in sooner than 01/30/18. She also said the she has a knot behind her knee. Pllease call to discuss.jw

## 2018-01-05 DIAGNOSIS — E669 Obesity, unspecified: Secondary | ICD-10-CM | POA: Diagnosis not present

## 2018-01-06 DIAGNOSIS — E669 Obesity, unspecified: Secondary | ICD-10-CM | POA: Diagnosis not present

## 2018-01-07 DIAGNOSIS — E669 Obesity, unspecified: Secondary | ICD-10-CM | POA: Diagnosis not present

## 2018-01-08 DIAGNOSIS — E669 Obesity, unspecified: Secondary | ICD-10-CM | POA: Diagnosis not present

## 2018-01-09 DIAGNOSIS — E669 Obesity, unspecified: Secondary | ICD-10-CM | POA: Diagnosis not present

## 2018-01-09 NOTE — Telephone Encounter (Signed)
Pt is calling again. She would like for Dr. Reesa Chew to call her as soon as possible concerning the knot she found. She also wants to let her know that she has already had one knot removed from the left knee. Please call pt back at [phone].

## 2018-01-09 NOTE — Telephone Encounter (Signed)
I have been on nights this week, called patient back to discuss her knot this evening.  She states years ago she had a "knot" behind her left knee which was removed.   She now has one behind her right knee that she has noticed over the last several weeks.  Quarter sized, nontender.  Does not sound like a Baker's cyst from her description of it and when it was removed on her left knee during an office there was no fluid drained and it required stitches.  I have asked her to schedule an appointment to be evaluated however she would rather wait for her appt on 11/25.  Red flags reviewed. Advised her if it gets worse in the meantime, she should be seen sooner at an urgent care or with [place].

## 2018-01-10 ENCOUNTER — Telehealth: Payer: Self-pay | Admitting: Family Medicine

## 2018-01-10 DIAGNOSIS — E669 Obesity, unspecified: Secondary | ICD-10-CM | POA: Diagnosis not present

## 2018-01-10 NOTE — Telephone Encounter (Signed)
handicapp  form dropped off for at front desk for completion.  Verified that patient section of form has been completed.  Last DOS/WCC with PCP was Amin.  Placed form in team folder to be completed by clinical staff.  Areatha Keas

## 2018-01-10 NOTE — Telephone Encounter (Signed)
Reviewed handicap placard request form and placed in PCP's box for completion.  [name], [place]

## 2018-01-11 DIAGNOSIS — E669 Obesity, unspecified: Secondary | ICD-10-CM | POA: Diagnosis not present

## 2018-01-12 ENCOUNTER — Other Ambulatory Visit: Payer: Self-pay | Admitting: Family Medicine

## 2018-01-12 DIAGNOSIS — E669 Obesity, unspecified: Secondary | ICD-10-CM | POA: Diagnosis not present

## 2018-01-12 MED ORDER — OXYCODONE-ACETAMINOPHEN 10-325 MG PO TABS: 1.0000 | ORAL_TABLET | Freq: Four times a day (QID) | ORAL | 0 refills | Status: DC | PRN

## 2018-01-12 NOTE — Telephone Encounter (Signed)
I have left handicap form and her prescription refill at front desk, please inform patient she may pick these up whenever convenient.

## 2018-01-13 DIAGNOSIS — E669 Obesity, unspecified: Secondary | ICD-10-CM | POA: Diagnosis not present

## 2018-01-13 NOTE — Telephone Encounter (Signed)
Informed patient.  [name], [place]

## 2018-01-14 DIAGNOSIS — E669 Obesity, unspecified: Secondary | ICD-10-CM | POA: Diagnosis not present

## 2018-01-15 DIAGNOSIS — E669 Obesity, unspecified: Secondary | ICD-10-CM | POA: Diagnosis not present

## 2018-01-16 DIAGNOSIS — E669 Obesity, unspecified: Secondary | ICD-10-CM | POA: Diagnosis not present

## 2018-01-17 DIAGNOSIS — E669 Obesity, unspecified: Secondary | ICD-10-CM | POA: Diagnosis not present

## 2018-01-18 DIAGNOSIS — E669 Obesity, unspecified: Secondary | ICD-10-CM | POA: Diagnosis not present

## 2018-01-19 DIAGNOSIS — E669 Obesity, unspecified: Secondary | ICD-10-CM | POA: Diagnosis not present

## 2018-01-20 ENCOUNTER — Telehealth: Payer: Self-pay

## 2018-01-20 DIAGNOSIS — E669 Obesity, unspecified: Secondary | ICD-10-CM | POA: Diagnosis not present

## 2018-01-20 NOTE — Telephone Encounter (Signed)
Phone call from patient that Oxycodone needs PA. Clinical info submitted via [place] [employer]. Status pending. Will check status next business day.  Danley Danker, RN [facility] RN)

## 2018-01-21 DIAGNOSIS — E669 Obesity, unspecified: Secondary | ICD-10-CM | POA: Diagnosis not present

## 2018-01-22 DIAGNOSIS — E669 Obesity, unspecified: Secondary | ICD-10-CM | POA: Diagnosis not present

## 2018-01-23 DIAGNOSIS — E669 Obesity, unspecified: Secondary | ICD-10-CM | POA: Diagnosis not present

## 2018-01-23 NOTE — Telephone Encounter (Signed)
Prior approval for oxycodone completed via [place] [employer].  Med approved for 01/20/2018 - 07/19/2018.  Prior approval # [id number].  [facility] pharmacy informed.  Fleeger, Salome Spotted, CMA

## 2018-01-24 DIAGNOSIS — E669 Obesity, unspecified: Secondary | ICD-10-CM | POA: Diagnosis not present

## 2018-01-25 DIAGNOSIS — E669 Obesity, unspecified: Secondary | ICD-10-CM | POA: Diagnosis not present

## 2018-01-26 DIAGNOSIS — E669 Obesity, unspecified: Secondary | ICD-10-CM | POA: Diagnosis not present

## 2018-01-27 DIAGNOSIS — E669 Obesity, unspecified: Secondary | ICD-10-CM | POA: Diagnosis not present

## 2018-01-28 DIAGNOSIS — E669 Obesity, unspecified: Secondary | ICD-10-CM | POA: Diagnosis not present

## 2018-01-29 DIAGNOSIS — E669 Obesity, unspecified: Secondary | ICD-10-CM | POA: Diagnosis not present

## 2018-01-30 ENCOUNTER — Ambulatory Visit: Payer: Medicaid Other | Admitting: Family Medicine

## 2018-01-30 DIAGNOSIS — E669 Obesity, unspecified: Secondary | ICD-10-CM | POA: Diagnosis not present

## 2018-01-31 DIAGNOSIS — E669 Obesity, unspecified: Secondary | ICD-10-CM | POA: Diagnosis not present

## 2018-02-01 DIAGNOSIS — E669 Obesity, unspecified: Secondary | ICD-10-CM | POA: Diagnosis not present

## 2018-02-03 DIAGNOSIS — E669 Obesity, unspecified: Secondary | ICD-10-CM | POA: Diagnosis not present

## 2018-02-04 DIAGNOSIS — E669 Obesity, unspecified: Secondary | ICD-10-CM | POA: Diagnosis not present

## 2018-02-05 DIAGNOSIS — E669 Obesity, unspecified: Secondary | ICD-10-CM | POA: Diagnosis not present

## 2018-02-06 DIAGNOSIS — E669 Obesity, unspecified: Secondary | ICD-10-CM | POA: Diagnosis not present

## 2018-02-07 DIAGNOSIS — E669 Obesity, unspecified: Secondary | ICD-10-CM | POA: Diagnosis not present

## 2018-02-08 DIAGNOSIS — E669 Obesity, unspecified: Secondary | ICD-10-CM | POA: Diagnosis not present

## 2018-02-09 DIAGNOSIS — E669 Obesity, unspecified: Secondary | ICD-10-CM | POA: Diagnosis not present

## 2018-02-10 DIAGNOSIS — E669 Obesity, unspecified: Secondary | ICD-10-CM | POA: Diagnosis not present

## 2018-02-11 DIAGNOSIS — E669 Obesity, unspecified: Secondary | ICD-10-CM | POA: Diagnosis not present

## 2018-02-12 DIAGNOSIS — E669 Obesity, unspecified: Secondary | ICD-10-CM | POA: Diagnosis not present

## 2018-02-13 DIAGNOSIS — E669 Obesity, unspecified: Secondary | ICD-10-CM | POA: Diagnosis not present

## 2018-02-14 DIAGNOSIS — E669 Obesity, unspecified: Secondary | ICD-10-CM | POA: Diagnosis not present

## 2018-02-15 DIAGNOSIS — E669 Obesity, unspecified: Secondary | ICD-10-CM | POA: Diagnosis not present

## 2018-02-16 ENCOUNTER — Encounter: Payer: Self-pay | Admitting: Family Medicine

## 2018-02-16 ENCOUNTER — Ambulatory Visit (INDEPENDENT_AMBULATORY_CARE_PROVIDER_SITE_OTHER): Payer: Medicaid Other | Admitting: Family Medicine

## 2018-02-16 VITALS — BP 122/65 | HR 66 | Temp 98.7°F | Wt 258.0 lb

## 2018-02-16 DIAGNOSIS — E038 Other specified hypothyroidism: Secondary | ICD-10-CM

## 2018-02-16 DIAGNOSIS — R41840 Attention and concentration deficit: Secondary | ICD-10-CM | POA: Diagnosis not present

## 2018-02-16 DIAGNOSIS — E890 Postprocedural hypothyroidism: Secondary | ICD-10-CM | POA: Diagnosis not present

## 2018-02-16 DIAGNOSIS — R358 Other polyuria: Secondary | ICD-10-CM | POA: Diagnosis not present

## 2018-02-16 DIAGNOSIS — E669 Obesity, unspecified: Secondary | ICD-10-CM | POA: Diagnosis not present

## 2018-02-16 DIAGNOSIS — R3589 Other polyuria: Secondary | ICD-10-CM

## 2018-02-16 LAB — POCT URINALYSIS DIP (MANUAL ENTRY)
Bilirubin, UA: NEGATIVE
Glucose, UA: NEGATIVE mg/dL
Ketones, POC UA: NEGATIVE mg/dL
LEUKOCYTES UA: NEGATIVE
NITRITE UA: NEGATIVE
Protein Ur, POC: NEGATIVE mg/dL
RBC UA: NEGATIVE
Spec Grav, UA: 1.02 (ref 1.010–1.025)
Urobilinogen, UA: 0.2 E.U./dL
pH, UA: 6.5 (ref 5.0–8.0)

## 2018-02-16 MED ORDER — ALPRAZOLAM 0.5 MG PO TABS: 0.5000 mg | ORAL_TABLET | Freq: Every evening | ORAL | 0 refills | Status: DC | PRN

## 2018-02-16 MED ORDER — AMPHETAMINE-DEXTROAMPHETAMINE 5 MG PO TABS: 5.0000 mg | ORAL_TABLET | Freq: Every day | ORAL | 0 refills | Status: DC

## 2018-02-16 MED ORDER — OXYCODONE-ACETAMINOPHEN 10-325 MG PO TABS: 1.0000 | ORAL_TABLET | Freq: Four times a day (QID) | ORAL | 0 refills | Status: DC | PRN

## 2018-02-16 MED ORDER — LEVOTHYROXINE SODIUM 200 MCG PO TABS
200.0000 ug | ORAL_TABLET | Freq: Every day | ORAL | 2 refills | Status: DC
Start: 2018-02-16 — End: 2018-07-20

## 2018-02-16 MED ORDER — QUETIAPINE FUMARATE 50 MG PO TABS: 50.0000 mg | ORAL_TABLET | Freq: Every day | ORAL | 3 refills | Status: DC

## 2018-02-16 MED ORDER — SULFAMETHOXAZOLE-TRIMETHOPRIM 800-160 MG PO TABS: 1.0000 | ORAL_TABLET | Freq: Two times a day (BID) | ORAL | 0 refills | Status: DC

## 2018-02-16 NOTE — Progress Notes (Signed)
Subjective:   Patient ID: [name]    DOB: September 27, 1965, 52 y.o. female   MRN: [id number]  CC: possible UTI    HPI: [name] is a 52 y.o. female who presents to clinic today for the following issue.  Concern for UTI Patient states she has been having some fullness in her lower belly and feels she may have a UTI.  No vaginal discharge.  No dysuria, frequency or urgency however she would like to be sure and check a urine sample today.  She has not noted any blood in her urine.    Difficulty concentrating Patient reports she has had some difficulty concentrating over the last 2 to 3 years however has not mentioned it to a provider previously.  She states she would like to try Adderall for her attention span as this has helped a friend of hers.  She has never taken any stimulants in the past for this issue.  Has tried meditation and relaxing breathing techniques with little improvement in symptoms.    ROS: See HPI for pertinent ROS.    Social: Patient is a former smoker, quit 2014.  Medications reviewed.  Objective:   BP 122/65   Pulse 66   Temp 98.7 F (37.1 C) (Oral)   Wt 258 lb (117 kg)   SpO2 98%   BMI 39.23 kg/m  Vitals and nursing note reviewed.  General: 52 year old female, NAD  Neck: supple CV: RRR no MRG  Lungs: CTAB, normal effort  Abdomen: soft, NTND, no suprapubic tenderness, +bs  Skin: warm, dry, no rash  Extremities: warm and well perfused, normal tone  Psych: Normal mood and affect   Assessment & Plan:   Concern for UTI No urinary symptoms however patient reports lower abdominal fullness and would like to be checked for urinary tract infection today. UA negative and reassuring, discussed results with her.  No red flags on exam.  Difficulty concentrating Patient reports difficulty concentrating over the last few years, no prior history of ADHD.  She would like to trial a stimulant.  Has tried meditation and relaxation modalities in the past with  little improvement in symptoms.  She does have underlying bipolar and anxiety which may be contributing.  Discussed side effects of stimulant medications with her and will trial low-dose Adderall, 5 mg tablet. -Rx: Adderall 5 mg, 30 tabs -Return precautions discussed -Follow-up 1 month  Orders Placed This Encounter  Procedures  . POCT urinalysis dipstick   Meds ordered this encounter  Medications  . amphetamine-dextroamphetamine (ADDERALL) 5 MG tablet    Sig: Take 1 tablet (5 mg total) by mouth daily.    Dispense:  30 tablet    Refill:  0  . levothyroxine (SYNTHROID, LEVOTHROID) 200 MCG tablet    Sig: Take 1 tablet (200 mcg total) by mouth daily.    Dispense:  90 tablet    Refill:  2  . oxyCODONE-acetaminophen (PERCOCET) 10-325 MG tablet    Sig: Take 1 tablet by mouth every 6 (six) hours as needed for pain.    Dispense:  84 tablet    Refill:  0  . QUEtiapine (SEROQUEL) 50 MG tablet    Sig: Take 1 tablet (50 mg total) by mouth at bedtime.    Dispense:  30 tablet    Refill:  3  . sulfamethoxazole-trimethoprim (BACTRIM DS,SEPTRA DS) 800-160 MG tablet    Sig: Take 1 tablet by mouth 2 (two) times daily.    Dispense:  14 tablet  Refill:  0  . ALPRAZolam (XANAX) 0.5 MG tablet    Sig: Take 1 tablet (0.5 mg total) by mouth at bedtime as needed for anxiety.    Dispense:  4 tablet    Refill:  0   Lovenia Kim, MD [place]

## 2018-02-16 NOTE — Patient Instructions (Signed)
It was good seeing you again today.  You were seen in clinic for possible UTI however urine did not show any signs of infection.  If you have new or worsening symptoms, please let us know.  Additionally, you were started on Adderall to improve your decreased focus.  I have sent in a prescription for 5 mg daily that you may take in the morning.  As we discussed, if you experience any symptoms of racing heart, high blood pressure or palpitations, these may be reasons to stop the medication.  I will follow-up with you in 1 month to see how you are doing.  Lovenia Kim MD

## 2018-02-17 DIAGNOSIS — E669 Obesity, unspecified: Secondary | ICD-10-CM | POA: Diagnosis not present

## 2018-02-18 DIAGNOSIS — E669 Obesity, unspecified: Secondary | ICD-10-CM | POA: Diagnosis not present

## 2018-02-19 DIAGNOSIS — E669 Obesity, unspecified: Secondary | ICD-10-CM | POA: Diagnosis not present

## 2018-02-20 DIAGNOSIS — E669 Obesity, unspecified: Secondary | ICD-10-CM | POA: Diagnosis not present

## 2018-02-21 ENCOUNTER — Other Ambulatory Visit: Payer: Self-pay | Admitting: Family Medicine

## 2018-02-21 DIAGNOSIS — E669 Obesity, unspecified: Secondary | ICD-10-CM | POA: Diagnosis not present

## 2018-02-21 DIAGNOSIS — R41840 Attention and concentration deficit: Secondary | ICD-10-CM | POA: Insufficient documentation

## 2018-02-21 NOTE — Assessment & Plan Note (Signed)
Patient reports difficulty concentrating over the last few years, no prior history of ADHD.  She would like to trial a stimulant.  Has tried meditation and relaxation modalities in the past with little improvement in symptoms.  She does have underlying bipolar and anxiety which may be contributing.  Discussed side effects of stimulant medications with her and will trial low-dose Adderall, 5 mg tablet. -Rx: Adderall 5 mg, 30 tabs -Return precautions discussed -Follow-up 1 month

## 2018-02-22 DIAGNOSIS — E669 Obesity, unspecified: Secondary | ICD-10-CM | POA: Diagnosis not present

## 2018-02-23 DIAGNOSIS — E669 Obesity, unspecified: Secondary | ICD-10-CM | POA: Diagnosis not present

## 2018-02-24 ENCOUNTER — Ambulatory Visit: Payer: Medicaid Other | Admitting: Physician Assistant

## 2018-02-24 DIAGNOSIS — E669 Obesity, unspecified: Secondary | ICD-10-CM | POA: Diagnosis not present

## 2018-02-25 DIAGNOSIS — E669 Obesity, unspecified: Secondary | ICD-10-CM | POA: Diagnosis not present

## 2018-02-26 DIAGNOSIS — E669 Obesity, unspecified: Secondary | ICD-10-CM | POA: Diagnosis not present

## 2018-02-27 DIAGNOSIS — E669 Obesity, unspecified: Secondary | ICD-10-CM | POA: Diagnosis not present

## 2018-02-28 DIAGNOSIS — E669 Obesity, unspecified: Secondary | ICD-10-CM | POA: Diagnosis not present

## 2018-03-02 DIAGNOSIS — E669 Obesity, unspecified: Secondary | ICD-10-CM | POA: Diagnosis not present

## 2018-03-03 DIAGNOSIS — E669 Obesity, unspecified: Secondary | ICD-10-CM | POA: Diagnosis not present

## 2018-03-04 DIAGNOSIS — E669 Obesity, unspecified: Secondary | ICD-10-CM | POA: Diagnosis not present

## 2018-03-05 DIAGNOSIS — E669 Obesity, unspecified: Secondary | ICD-10-CM | POA: Diagnosis not present

## 2018-03-06 DIAGNOSIS — E669 Obesity, unspecified: Secondary | ICD-10-CM | POA: Diagnosis not present

## 2018-03-07 DIAGNOSIS — E669 Obesity, unspecified: Secondary | ICD-10-CM | POA: Diagnosis not present

## 2018-03-08 DIAGNOSIS — E669 Obesity, unspecified: Secondary | ICD-10-CM | POA: Diagnosis not present

## 2018-03-09 DIAGNOSIS — E669 Obesity, unspecified: Secondary | ICD-10-CM | POA: Diagnosis not present

## 2018-03-11 DIAGNOSIS — E669 Obesity, unspecified: Secondary | ICD-10-CM | POA: Diagnosis not present

## 2018-03-12 DIAGNOSIS — E669 Obesity, unspecified: Secondary | ICD-10-CM | POA: Diagnosis not present

## 2018-03-13 DIAGNOSIS — E669 Obesity, unspecified: Secondary | ICD-10-CM | POA: Diagnosis not present

## 2018-03-14 DIAGNOSIS — E669 Obesity, unspecified: Secondary | ICD-10-CM | POA: Diagnosis not present

## 2018-03-15 ENCOUNTER — Encounter: Payer: Self-pay | Admitting: Physician Assistant

## 2018-03-15 ENCOUNTER — Ambulatory Visit (INDEPENDENT_AMBULATORY_CARE_PROVIDER_SITE_OTHER): Payer: Medicaid Other | Admitting: Physician Assistant

## 2018-03-15 VITALS — BP 110/82 | HR 93 | Ht 68.0 in | Wt 239.0 lb

## 2018-03-15 DIAGNOSIS — E669 Obesity, unspecified: Secondary | ICD-10-CM | POA: Diagnosis not present

## 2018-03-15 DIAGNOSIS — K5900 Constipation, unspecified: Secondary | ICD-10-CM

## 2018-03-15 DIAGNOSIS — R1314 Dysphagia, pharyngoesophageal phase: Secondary | ICD-10-CM | POA: Diagnosis not present

## 2018-03-15 DIAGNOSIS — Z86010 Personal history of colonic polyps: Secondary | ICD-10-CM | POA: Diagnosis not present

## 2018-03-15 DIAGNOSIS — K219 Gastro-esophageal reflux disease without esophagitis: Secondary | ICD-10-CM

## 2018-03-15 MED ORDER — FAMOTIDINE 40 MG PO TABS: ORAL_TABLET | ORAL | 5 refills | Status: DC

## 2018-03-15 NOTE — Patient Instructions (Signed)
We have sent the following medications to your pharmacy for you to pick up at your convenience:  Pepcid  You have been scheduled for an endoscopy. Please follow written instructions given to you at your visit today. If you use inhalers (even only as needed), please bring them with you on the day of your procedure. Your physician has requested that you go to www.startemmi.com and enter the access code given to you at your visit today. This web site gives a general overview about your procedure. However, you should still follow specific instructions given to you by our office regarding your preparation for the procedure.

## 2018-03-15 NOTE — Progress Notes (Addendum)
Chief Complaint: Dysphagia  HPI:    [name] is a 53 year old female, known to Dr. Hilarie Fredrickson, with a history of GERD, chronic right-sided abdominal pain and sessile serrated colon polyps, who presents to clinic today with a complaint of dysphagia.      08/09/16 EGD with Dr. Hilarie Fredrickson with normal esophagus, stomach and duodenum, empirically dilated to 17 mm.    08/02/2017 office visit with Dr. West Carbo discuss scratchy throat and intermittent cough and hoarseness as well as some heartburn.  It was discussed that possibly some of her hoarseness coughing and throat clearing is related to uncontrolled reflux.  Smoking was likely contributing as well.  She was changed from Omeprazole to Pantoprazole 40 mg twice daily AC to see if this helped.  Abdominal bloating was discussed recommend she try Align 1 capsule daily for gas and bloating.  Surveillance colonoscopy recommended in 2021 for history of sessile serrated polyps.    Today, the patient explains that right before Christmas started with a feeling like she could not swallow her food and in fact reports losing about 20 pounds between then and now.  Initially felt like there was something stuck in her throat and food would not pass through, she increased her Pantoprazole to 40 mg 2 tabs in the a.m. and 2 tabs in the p.m., "even though I thought this was too much".  This helped slightly and patient continues now with a "pressure in my throat" she tells me it feels like her esophagus is swollen and she is still having some trouble swallowing foods.  She has continued herself on 40 mg now 1 tab in the morning and 1 tab in the evening.    Also describes constipation.  She was using milk of magnesia for this in the past but this stopped working for her.  Most recently over the past 2 days she has been using prune juice on a daily basis which has actually helped her achieve regular bowel movements.    Denies fever, chills, nausea, vomiting or symptoms that awaken her  from sleep.  Past Medical History:  Diagnosis Date  . Allergy   . Anxiety   . Arthritis    knees,hands, hip  pt. Denies  . Cellulitis   . Colon polyps 2013   SESSIL SERRATED ADENOMA (ALL FRAGMENTS)  . Complication of anesthesia    Lowered BP had to keep pt. overnight  . COPD (chronic obstructive pulmonary disease) ([place])   . Depression   . Fibroadenosis breast   . GERD (gastroesophageal reflux disease)    does not take med  . Heart murmur    as child  pt. denies  . Hyperlipidemia    no meds - diet controlled  . Hypothyroidism   . Lymph edema    both legs and feet  . Migraine   . Plantar fasciitis   . Syphilis Treated in early 2012 during hospitalization  . Uterine fibroid   . Varicose veins     Past Surgical History:  Procedure Laterality Date  . COLONOSCOPY  2013  . cyst removed     behind left knee and left cheek  . ENDOMETRIAL ABLATION    . ESOPHAGOGASTRODUODENOSCOPY  2013   normal  . EYE SURGERY     bilarteral  . HYDRADENITIS EXCISION Right 09/13/2017   Procedure: EXCISION HIDRADENITIS GROIN;  Surgeon: Clovis Riley, MD;  Location: [facility];  Service: General;  Laterality: Right;  LMA  . INCISION AND DRAINAGE ABSCESS / HEMATOMA OF  BURSA / KNEE / THIGH     right upper thigh - 2008  . NORPLANT REMOVAL     right upper arm - 12/2001  . svd     x 2  . THYROID SURGERY  2004   removed  . TUBAL LIGATION     2003  . WISDOM TOOTH EXTRACTION      Current Outpatient Medications  Medication Sig Dispense Refill  . albuterol (PROAIR HFA) 108 (90 Base) MCG/ACT inhaler Inhale 2 puffs into the lungs every 4 (four) hours as needed for shortness of breath. 18 g 5  . ALPRAZolam (XANAX) 0.5 MG tablet Take 1 tablet (0.5 mg total) by mouth at bedtime as needed for anxiety. 4 tablet 0  . amphetamine-dextroamphetamine (ADDERALL) 5 MG tablet Take 1 tablet (5 mg total) by mouth daily. 30 tablet 0  . clobetasol cream (TEMOVATE) 0.05 % Apply topically 2 (two) times daily. 60 g 6    . furosemide (LASIX) 40 MG tablet Take 1 tablet (40 mg total) by mouth daily. 30 tablet 0  . levothyroxine (SYNTHROID, LEVOTHROID) 200 MCG tablet Take 1 tablet (200 mcg total) by mouth daily. 90 tablet 2  . LYRICA 150 MG capsule Take 1 capsule (150 mg total) by mouth 2 (two) times daily. 90 capsule 3  . meloxicam (MOBIC) 7.5 MG tablet Take 1 tablet (7.5 mg total) by mouth daily as needed for pain. 90 tablet 2  . nicotine (NICODERM CQ - DOSED IN MG/24 HR) 7 mg/24hr patch Place 1 patch (7 mg total) onto the skin daily. 28 patch 1  . ondansetron (ZOFRAN) 4 MG tablet TAKE 1 TABLET(4 MG) BY MOUTH EVERY 8 HOURS AS NEEDED FOR NAUSEA OR VOMITING 30 tablet 0  . oxyCODONE-acetaminophen (PERCOCET) 10-325 MG tablet Take 1 tablet by mouth every 6 (six) hours as needed for pain. 84 tablet 0  . pantoprazole (PROTONIX) 40 MG tablet Take 1 tablet (40 mg total) by mouth 2 (two) times daily before a meal. 60 tablet 10  . pravastatin (PRAVACHOL) 20 MG tablet Take 1 tablet (20 mg total) by mouth daily. 90 tablet 3  . QUEtiapine (SEROQUEL) 50 MG tablet Take 1 tablet (50 mg total) by mouth at bedtime. 30 tablet 3  . SPIRIVA HANDIHALER 18 MCG inhalation capsule Place 1 capsule (18 mcg total) into inhaler and inhale daily as needed. 30 capsule 11  . sulfamethoxazole-trimethoprim (BACTRIM DS,SEPTRA DS) 800-160 MG tablet Take 1 tablet by mouth 2 (two) times daily. 14 tablet 0   No current facility-administered medications for this visit.     Allergies as of 03/15/2018 - Review Complete 02/21/2018  Allergen Reaction Noted  . Vicodin [hydrocodone-acetaminophen] Itching 02/02/2011    Family History  Problem Relation Age of Onset  . Diabetes Father   . Heart disease Father   . Hyperlipidemia Father   . Hypertension Father   . Diabetes Mother   . Hypertension Mother   . Hyperlipidemia Mother   . Diabetes Paternal Grandmother   . Colon cancer Neg Hx   . Esophageal cancer Neg Hx   . Rectal cancer Neg Hx   .  Stomach cancer Neg Hx     Social History   Socioeconomic History  . Marital status: Divorced    Spouse name: Not on file  . Number of children: 2  . Years of education: Not on file  . Highest education level: Not on file  Occupational History  . Occupation: disabled  Social Needs  . Financial resource strain:  Not on file  . Food insecurity:    Worry: Not on file    Inability: Not on file  . Transportation needs:    Medical: Not on file    Non-medical: Not on file  Tobacco Use  . Smoking status: Former Smoker    Packs/day: 0.50    Years: 25.00    Pack years: 12.50    Types: Cigarettes    Last attempt to quit: 06/30/2012    Years since quitting: 5.7  . Smokeless tobacco: Never Used  Substance and Sexual Activity  . Alcohol use: Not Currently    Comment: rare  . Drug use: Not Currently    Types: Marijuana    Comment: past hx 35yrs ago  . Sexual activity: Yes    Birth control/protection: Surgical  Lifestyle  . Physical activity:    Days per week: Not on file    Minutes per session: Not on file  . Stress: Not on file  Relationships  . Social connections:    Talks on phone: Not on file    Gets together: Not on file    Attends religious service: Not on file    Active member of club or organization: Not on file    Attends meetings of clubs or organizations: Not on file    Relationship status: Not on file  . Intimate partner violence:    Fear of current or ex partner: Not on file    Emotionally abused: Not on file    Physically abused: Not on file    Forced sexual activity: Not on file  Other Topics Concern  . Not on file  Social History Narrative   Lives with son in [place].     Review of Systems:    Constitutional: No weight loss, fever or chills Cardiovascular: No chest pain Respiratory: No SOB  Gastrointestinal: See HPI and otherwise negative   Physical Exam:  Vital signs: BP 110/82   Pulse 93   Ht 5\' 8"  (1.727 m)   Wt 239 lb (108.4 kg)   BMI  36.34 kg/m   Constitutional:  AA female appears to be in NAD, Well developed, Well nourished, alert and cooperative Respiratory: Respirations even and unlabored. Lungs clear to auscultation bilaterally.   No wheezes, crackles, or rhonchi.  Cardiovascular: Normal S1, S2. No MRG. Regular rate and rhythm. No peripheral edema, cyanosis or pallor.  Gastrointestinal:  Soft, nondistended, mild epigastric ttp. No rebound or guarding. Normal bowel sounds. No appreciable masses or hepatomegaly. Psychiatric: Demonstrates good judgement and reason without abnormal affect or behaviors.  No recent labs or imaging.  Assessment: 1. Dysphagia: For the past month, patient reports a 20 pound weight loss related to this, history of esophageal empiric dilation in 2018 which helped per the patient; consider esophageal stricture versus ring versus other 2.  History of sessile serrated polyps 3.  GERD: Uncontrolled on pantoprazole 40 mg twice daily 4. Constipation: doing better with Prune juice  Plan: 1.  Scheduled patient for an EGD dilation in the [place] with Dr. Hilarie Fredrickson.  Did discuss risks, benefits, limitations and alternatives and the patient agrees to proceed. 2.  Continue Pantoprazole 40 mg twice daily 3.  Added Pepcid 40 mg every morning and nightly #60 with 3 refills 4.  Repeat colonoscopy recommended in 2021 for surveillance of SSP 5.  Continue prune juice as this is working for constipation.  Also discussed MiraLAX up to 4 times a day 6.  Patient to follow in clinic per recommendations from  Dr. Hilarie Fredrickson after time of procedure.  Ellouise Newer, PA-C [facility] 03/15/2018, 9:45 AM  Cc: Lovenia Kim, MD   Addendum: Reviewed and agree with assessment and management plan. Pyrtle, Lajuan Lines, MD

## 2018-03-16 DIAGNOSIS — E669 Obesity, unspecified: Secondary | ICD-10-CM | POA: Diagnosis not present

## 2018-03-17 DIAGNOSIS — E669 Obesity, unspecified: Secondary | ICD-10-CM | POA: Diagnosis not present

## 2018-03-18 DIAGNOSIS — E669 Obesity, unspecified: Secondary | ICD-10-CM | POA: Diagnosis not present

## 2018-03-19 DIAGNOSIS — E669 Obesity, unspecified: Secondary | ICD-10-CM | POA: Diagnosis not present

## 2018-03-20 ENCOUNTER — Ambulatory Visit (AMBULATORY_SURGERY_CENTER): Payer: Medicaid Other | Admitting: Internal Medicine

## 2018-03-20 ENCOUNTER — Encounter: Payer: Self-pay | Admitting: Internal Medicine

## 2018-03-20 VITALS — BP 112/64 | HR 63 | Temp 99.5°F | Resp 17 | Ht 68.0 in | Wt 239.0 lb

## 2018-03-20 DIAGNOSIS — K222 Esophageal obstruction: Secondary | ICD-10-CM

## 2018-03-20 DIAGNOSIS — E669 Obesity, unspecified: Secondary | ICD-10-CM | POA: Diagnosis not present

## 2018-03-20 DIAGNOSIS — K2970 Gastritis, unspecified, without bleeding: Secondary | ICD-10-CM

## 2018-03-20 DIAGNOSIS — R1310 Dysphagia, unspecified: Secondary | ICD-10-CM | POA: Diagnosis not present

## 2018-03-20 DIAGNOSIS — R1314 Dysphagia, pharyngoesophageal phase: Secondary | ICD-10-CM

## 2018-03-20 DIAGNOSIS — K2950 Unspecified chronic gastritis without bleeding: Secondary | ICD-10-CM | POA: Diagnosis not present

## 2018-03-20 MED ORDER — SODIUM CHLORIDE 0.9 % IV SOLN: 500.0000 mL | Freq: Once | INTRAVENOUS | Status: DC

## 2018-03-20 NOTE — Progress Notes (Signed)
A/ox3, pleased with MAC, report to RN 

## 2018-03-20 NOTE — Progress Notes (Addendum)
Per Dr. Hilarie Fredrickson pt did not have to wait 1 hour to drink clear liquid.  Clear liquids for 2 hours from 3:30 pm - 5:30 pm then soft diet the rest of today.  Resume your prior diet tomorrow Am.  No problems noted per pt. Maw  Pt asked if she could have a cough drop to suck on.  Per Dr. Hilarie Fredrickson pt said that is fine. maw

## 2018-03-20 NOTE — Op Note (Signed)
[place] Patient Name: [name] Procedure Date: 03/20/2018 2:52 PM MRN: [id number] Endoscopist: Jerene Bears , MD Age: 53 Referring MD:  Date of Birth: 1965/09/07 Gender: Female Account #: [id number] Procedure:                Upper GI endoscopy Indications:              Dysphagia, Gastro-esophageal reflux disease, smooth                            narrowing seen by esophagram and prior dilation to                            17 mm with improvement in dysphagia symptom Medicines:                Monitored Anesthesia Care Procedure:                Pre-Anesthesia Assessment:                           - Prior to the procedure, a History and Physical                            was performed, and patient medications and                            allergies were reviewed. The patient's tolerance of                            previous anesthesia was also reviewed. The risks                            and benefits of the procedure and the sedation                            options and risks were discussed with the patient.                            All questions were answered, and informed consent                            was obtained. Prior Anticoagulants: The patient has                            taken no previous anticoagulant or antiplatelet                            agents. ASA Grade Assessment: II - A patient with                            mild systemic disease. After reviewing the risks                            and benefits, the patient was deemed in  satisfactory condition to undergo the procedure.                           After obtaining informed consent, the endoscope was                            passed under direct vision. Throughout the                            procedure, the patient's blood pressure, pulse, and                            oxygen saturations were monitored continuously. The                            Endoscope  was introduced through the mouth, and                            advanced to the second part of duodenum. The upper                            GI endoscopy was accomplished without difficulty.                            The patient tolerated the procedure well. Scope In: Scope Out: Findings:                 Normal mucosa was found in the entire esophagus.                           A widely patent and non-obstructing Schatzki ring                            was found at the gastroesophageal junction. A TTS                            dilator was passed through the scope. Dilation with                            a 16-17-18 mm balloon dilator was performed to 18                            mm.                           Mild inflammation characterized by erythema was                            found in the gastric antrum. Biopsies were taken                            with a cold forceps for histology and Helicobacter  pylori testing.                           The examined duodenum was normal. Complications:            No immediate complications. Estimated Blood Loss:     Estimated blood loss was minimal. Impression:               - Normal mucosa was found in the entire esophagus.                           - Widely patent and non-obstructing Schatzki ring.                            Dilated to 18 mm.                           - Mild gastritis. Biopsied.                           - Normal examined duodenum. Recommendation:           - Patient has a contact number available for                            emergencies. The signs and symptoms of potential                            delayed complications were discussed with the                            patient. Return to normal activities tomorrow.                            Written discharge instructions were provided to the                            patient.                           - Resume previous diet.                            - Continue present medications.                           - Await pathology results. Jerene Bears, MD 03/20/2018 3:33:39 PM This report has been signed electronically.

## 2018-03-20 NOTE — Progress Notes (Signed)
Called to room to assist during endoscopic procedure.  Patient ID and intended procedure confirmed with present staff. Received instructions for my participation in the procedure from the performing physician.  

## 2018-03-20 NOTE — Patient Instructions (Signed)
YOU HAD AN ENDOSCOPIC PROCEDURE TODAY AT [facility]:   Refer to the procedure report that was given to you for any specific questions about what was found during the examination.  If the procedure report does not answer your questions, please call your gastroenterologist to clarify.  If you requested that your care partner not be given the details of your procedure findings, then the procedure report has been included in a sealed envelope for you to review at your convenience later.  YOU SHOULD EXPECT: Some feelings of bloating in the abdomen. Passage of more gas than usual.  Walking can help get rid of the air that was put into your GI tract during the procedure and reduce the bloating. If you had a lower endoscopy (such as a colonoscopy or flexible sigmoidoscopy) you may notice spotting of blood in your stool or on the toilet paper. If you underwent a bowel prep for your procedure, you may not have a normal bowel movement for a few days.  Please Note:  You might notice some irritation and congestion in your nose or some drainage.  This is from the oxygen used during your procedure.  There is no need for concern and it should clear up in a day or so.  SYMPTOMS TO REPORT IMMEDIATELY:    Following upper endoscopy (EGD)  Vomiting of blood or coffee ground material  New chest pain or pain under the shoulder blades  Painful or persistently difficult swallowing  New shortness of breath  Fever of 100F or higher  Black, tarry-looking stools  For urgent or emergent issues, a gastroenterologist can be reached at any hour by calling [phone].   DIET: Please follow the esophageal  dilation diet the rest of today.  Handout was given to your care partner.  Drink plenty of fluids but you should avoid alcoholic beverages for 24 hours.  ACTIVITY:  You should plan to take it easy for the rest of today and you should NOT DRIVE or use heavy machinery until tomorrow (because of the  sedation medicines used during the test).    FOLLOW UP: Our staff will call the number listed on your records the next business day following your procedure to check on you and address any questions or concerns that you may have regarding the information given to you following your procedure. If we do not reach you, we will leave a message.  However, if you are feeling well and you are not experiencing any problems, there is no need to return our call.  We will assume that you have returned to your regular daily activities without incident.  If any biopsies were taken you will be contacted by phone or by letter within the next 1-3 weeks.  Please call us at [phone] if you have not heard about the biopsies in 3 weeks.    SIGNATURES/CONFIDENTIALITY: You and/or your care partner have signed paperwork which will be entered into your electronic medical record.  These signatures attest to the fact that that the information above on your After Visit Summary has been reviewed and is understood.  Full responsibility of the confidentiality of this discharge information lies with you and/or your care-partner.    Handouts were given to your care partner on Gastritis andEsophageal Dilatation diet to follow the rest of today.   You may resume your current medications today. Await biopsy results. Please call if any questions or concerns.

## 2018-03-21 ENCOUNTER — Telehealth: Payer: Self-pay

## 2018-03-21 DIAGNOSIS — E669 Obesity, unspecified: Secondary | ICD-10-CM | POA: Diagnosis not present

## 2018-03-21 NOTE — Telephone Encounter (Signed)
  Follow up Call-  Call back number 03/20/2018 08/09/2016  Post procedure Call Back phone  # [phone]  Permission to leave phone message Yes Yes  Some recent data might be hidden     Patient questions:  Do you have a fever, pain , or abdominal swelling? No. Pain Score  0 *  Have you tolerated food without any problems? Yes.    Have you been able to return to your normal activities? Yes.    Do you have any questions about your discharge instructions: Diet   No. Medications  No. Follow up visit  No.  Do you have questions or concerns about your Care? Yes.  patient says under her ear is sore, informed her to see dr if soreness continues. She said she has routine appointment with her primary doctor this week  Actions: * If pain score is 4 or above: No action needed, pain <4.

## 2018-03-22 DIAGNOSIS — E669 Obesity, unspecified: Secondary | ICD-10-CM | POA: Diagnosis not present

## 2018-03-23 DIAGNOSIS — E669 Obesity, unspecified: Secondary | ICD-10-CM | POA: Diagnosis not present

## 2018-03-24 ENCOUNTER — Encounter: Payer: Self-pay | Admitting: Family Medicine

## 2018-03-24 ENCOUNTER — Ambulatory Visit (INDEPENDENT_AMBULATORY_CARE_PROVIDER_SITE_OTHER): Payer: Medicaid Other | Admitting: Family Medicine

## 2018-03-24 ENCOUNTER — Encounter: Payer: Self-pay | Admitting: Internal Medicine

## 2018-03-24 ENCOUNTER — Other Ambulatory Visit: Payer: Self-pay

## 2018-03-24 VITALS — BP 112/72 | HR 71 | Temp 97.9°F | Ht 68.0 in | Wt 239.2 lb

## 2018-03-24 DIAGNOSIS — F419 Anxiety disorder, unspecified: Secondary | ICD-10-CM

## 2018-03-24 DIAGNOSIS — F329 Major depressive disorder, single episode, unspecified: Secondary | ICD-10-CM | POA: Diagnosis not present

## 2018-03-24 DIAGNOSIS — I890 Lymphedema, not elsewhere classified: Secondary | ICD-10-CM

## 2018-03-24 DIAGNOSIS — G894 Chronic pain syndrome: Secondary | ICD-10-CM | POA: Diagnosis not present

## 2018-03-24 DIAGNOSIS — E669 Obesity, unspecified: Secondary | ICD-10-CM | POA: Diagnosis not present

## 2018-03-24 MED ORDER — LYRICA 150 MG PO CAPS: 150.0000 mg | ORAL_CAPSULE | Freq: Two times a day (BID) | ORAL | 3 refills | Status: DC

## 2018-03-24 MED ORDER — PRAVASTATIN SODIUM 20 MG PO TABS: 20.0000 mg | ORAL_TABLET | Freq: Every day | ORAL | 3 refills | Status: DC

## 2018-03-24 MED ORDER — OXYCODONE-ACETAMINOPHEN 10-325 MG PO TABS: 1.0000 | ORAL_TABLET | Freq: Four times a day (QID) | ORAL | 0 refills | Status: DC | PRN

## 2018-03-24 MED ORDER — ALPRAZOLAM 0.5 MG PO TABS: 0.5000 mg | ORAL_TABLET | Freq: Every evening | ORAL | 0 refills | Status: DC | PRN

## 2018-03-24 MED ORDER — HYDROXYZINE HCL 10 MG PO TABS: 10.0000 mg | ORAL_TABLET | Freq: Three times a day (TID) | ORAL | 0 refills | Status: DC | PRN

## 2018-03-24 MED ORDER — ALBUTEROL SULFATE HFA 108 (90 BASE) MCG/ACT IN AERS: 2.0000 | INHALATION_SPRAY | RESPIRATORY_TRACT | 5 refills | Status: DC | PRN

## 2018-03-24 NOTE — Patient Instructions (Signed)
It was nice to see you again today.  You were seen in clinic for follow-up of your anxiety.  As we discussed, I have refilled your Xanax however I am starting a new medication called hydroxyzine which also may help manage  your symptoms.  You may take this up to 3 times daily as needed.  Please call clinic if you have any questions.  Lovenia Kim MD

## 2018-03-24 NOTE — Progress Notes (Signed)
Subjective:   Patient ID: [name]    DOB: 1966/02/20, 53 y.o. female   MRN: [id number]  CC: f/u chronic medication refill, anxiety  HPI: [name] is a 53 y.o. female who presents to clinic today for the following issue.  Anxiety Feeling panicked about her throat closing up. She states she recently saw the gastroenterologist and had an endoscopy done.  She is concerned about the results of this.  Denies symptoms of depression, no SI/HI.  No feeling of hopelessness or sadness.  She is requesting an increase of her Xanax to help relax her.  Is prescribed a small amount every month for prn use due to anxiety around her mother's death.  No other concerns at this time.   ROS: No fever, chills, nausea, vomiting.  No CP, SOB.    Social: pt is in process of quitting, former smoker  Medications reviewed. Objective:   BP 112/72   Pulse 71   Temp 97.9 F (36.6 C) (Oral)   Ht 5\' 8"  (1.727 m)   Wt 239 lb 4 oz (108.5 kg)   LMP 03/20/2018   SpO2 96%   BMI 36.38 kg/m  Vitals and nursing note reviewed.  General: 53 yo female, NAD  HEENT: NCAT, EOMI, PERRL, MMM Neck: supple, non-tender, no thyromegaly  CV: RRR no MRG  Lungs: CTAB, normal effort  Abdomen: soft, +bs  Skin: warm, dry, no rash  Extremities: warm and well perfused, normal tone  Assessment & Plan:   Anxiety and depression Describes anxiety surrounding her mother's death anniversary in addition to feeling like her throat is closing up.  She has had a recent endoscopy on January 13th.  No findings on physical exam to suggest obstruction or difficulty swallowing.  Curious if this is due largely in part to anxiety.  Reassurance provided and results reviewed with her.   -Rx: hydroxyzine  -Discussed adverse effects  Return if symptoms worsen or do not improve    Meds ordered this encounter  Medications  . DISCONTD: ALPRAZolam (XANAX) 0.5 MG tablet    Sig: Take 1 tablet (0.5 mg total) by mouth at bedtime as needed  for anxiety.    Dispense:  4 tablet    Refill:  0  . hydrOXYzine (ATARAX/VISTARIL) 10 MG tablet    Sig: Take 1 tablet (10 mg total) by mouth 3 (three) times daily as needed.    Dispense:  90 tablet    Refill:  0  . oxyCODONE-acetaminophen (PERCOCET) 10-325 MG tablet    Sig: Take 1 tablet by mouth every 6 (six) hours as needed for pain.    Dispense:  84 tablet    Refill:  0  . LYRICA 150 MG capsule    Sig: Take 1 capsule (150 mg total) by mouth 2 (two) times daily.    Dispense:  90 capsule    Refill:  3  . albuterol (PROAIR HFA) 108 (90 Base) MCG/ACT inhaler    Sig: Inhale 2 puffs into the lungs every 4 (four) hours as needed for shortness of breath.    Dispense:  18 g    Refill:  5  . pravastatin (PRAVACHOL) 20 MG tablet    Sig: Take 1 tablet (20 mg total) by mouth daily.    Dispense:  90 tablet    Refill:  3  . ALPRAZolam (XANAX) 0.5 MG tablet    Sig: Take 1 tablet (0.5 mg total) by mouth at bedtime as needed for anxiety.  Dispense:  4 tablet    Refill:  0   Lovenia Kim, MD [facility] PGY-3

## 2018-03-25 DIAGNOSIS — E669 Obesity, unspecified: Secondary | ICD-10-CM | POA: Diagnosis not present

## 2018-03-26 DIAGNOSIS — E669 Obesity, unspecified: Secondary | ICD-10-CM | POA: Diagnosis not present

## 2018-03-27 DIAGNOSIS — E669 Obesity, unspecified: Secondary | ICD-10-CM | POA: Diagnosis not present

## 2018-03-28 DIAGNOSIS — E669 Obesity, unspecified: Secondary | ICD-10-CM | POA: Diagnosis not present

## 2018-03-28 NOTE — Assessment & Plan Note (Addendum)
Describes anxiety surrounding her mother's death anniversary in addition to feeling like her throat is closing up.  She has had a recent endoscopy on January 13th.  No findings on physical exam to suggest obstruction or difficulty swallowing.  Curious if this is due largely in part to anxiety.  Reassurance provided and results reviewed with her.   -Rx: hydroxyzine  -Discussed adverse effects  Return if symptoms worsen or do not improve

## 2018-03-29 DIAGNOSIS — E669 Obesity, unspecified: Secondary | ICD-10-CM | POA: Diagnosis not present

## 2018-03-30 DIAGNOSIS — E669 Obesity, unspecified: Secondary | ICD-10-CM | POA: Diagnosis not present

## 2018-03-31 DIAGNOSIS — E669 Obesity, unspecified: Secondary | ICD-10-CM | POA: Diagnosis not present

## 2018-04-01 DIAGNOSIS — E669 Obesity, unspecified: Secondary | ICD-10-CM | POA: Diagnosis not present

## 2018-04-02 DIAGNOSIS — E669 Obesity, unspecified: Secondary | ICD-10-CM | POA: Diagnosis not present

## 2018-04-03 DIAGNOSIS — E669 Obesity, unspecified: Secondary | ICD-10-CM | POA: Diagnosis not present

## 2018-04-04 DIAGNOSIS — E669 Obesity, unspecified: Secondary | ICD-10-CM | POA: Diagnosis not present

## 2018-04-05 DIAGNOSIS — E669 Obesity, unspecified: Secondary | ICD-10-CM | POA: Diagnosis not present

## 2018-04-06 DIAGNOSIS — E669 Obesity, unspecified: Secondary | ICD-10-CM | POA: Diagnosis not present

## 2018-04-07 DIAGNOSIS — E669 Obesity, unspecified: Secondary | ICD-10-CM | POA: Diagnosis not present

## 2018-04-08 DIAGNOSIS — E669 Obesity, unspecified: Secondary | ICD-10-CM | POA: Diagnosis not present

## 2018-04-09 DIAGNOSIS — E669 Obesity, unspecified: Secondary | ICD-10-CM | POA: Diagnosis not present

## 2018-04-10 DIAGNOSIS — E669 Obesity, unspecified: Secondary | ICD-10-CM | POA: Diagnosis not present

## 2018-04-11 DIAGNOSIS — E669 Obesity, unspecified: Secondary | ICD-10-CM | POA: Diagnosis not present

## 2018-04-12 DIAGNOSIS — E669 Obesity, unspecified: Secondary | ICD-10-CM | POA: Diagnosis not present

## 2018-04-13 DIAGNOSIS — E669 Obesity, unspecified: Secondary | ICD-10-CM | POA: Diagnosis not present

## 2018-04-14 DIAGNOSIS — E669 Obesity, unspecified: Secondary | ICD-10-CM | POA: Diagnosis not present

## 2018-04-15 DIAGNOSIS — E669 Obesity, unspecified: Secondary | ICD-10-CM | POA: Diagnosis not present

## 2018-04-16 DIAGNOSIS — E669 Obesity, unspecified: Secondary | ICD-10-CM | POA: Diagnosis not present

## 2018-04-17 DIAGNOSIS — E669 Obesity, unspecified: Secondary | ICD-10-CM | POA: Diagnosis not present

## 2018-04-18 DIAGNOSIS — E669 Obesity, unspecified: Secondary | ICD-10-CM | POA: Diagnosis not present

## 2018-04-19 DIAGNOSIS — E669 Obesity, unspecified: Secondary | ICD-10-CM | POA: Diagnosis not present

## 2018-04-20 DIAGNOSIS — E669 Obesity, unspecified: Secondary | ICD-10-CM | POA: Diagnosis not present

## 2018-04-21 ENCOUNTER — Ambulatory Visit (INDEPENDENT_AMBULATORY_CARE_PROVIDER_SITE_OTHER): Payer: Medicaid Other | Admitting: Family Medicine

## 2018-04-21 VITALS — BP 110/70 | HR 73 | Temp 98.6°F | Wt 237.8 lb

## 2018-04-21 DIAGNOSIS — F329 Major depressive disorder, single episode, unspecified: Secondary | ICD-10-CM

## 2018-04-21 DIAGNOSIS — E669 Obesity, unspecified: Secondary | ICD-10-CM | POA: Diagnosis not present

## 2018-04-21 DIAGNOSIS — F32A Depression, unspecified: Secondary | ICD-10-CM

## 2018-04-21 DIAGNOSIS — F419 Anxiety disorder, unspecified: Secondary | ICD-10-CM | POA: Diagnosis not present

## 2018-04-21 DIAGNOSIS — E785 Hyperlipidemia, unspecified: Secondary | ICD-10-CM

## 2018-04-21 MED ORDER — OXYCODONE-ACETAMINOPHEN 10-325 MG PO TABS: 1.0000 | ORAL_TABLET | Freq: Four times a day (QID) | ORAL | 0 refills | Status: DC | PRN

## 2018-04-21 MED ORDER — ALPRAZOLAM 0.5 MG PO TABS: 0.5000 mg | ORAL_TABLET | Freq: Every evening | ORAL | 0 refills | Status: DC | PRN

## 2018-04-21 NOTE — Progress Notes (Signed)
   Subjective:   Patient ID: PEYTEN PUNCHES    DOB: Apr 23, 1965, 53 y.o. female   MRN: [id number]  CC: medication refill   HPI: KRYSSA RISENHOOVER is a 53 y.o. female who presents to clinic today for the following issues.  Anxiety No new or worsening symptoms, stable on current regimen of prn Xanax for situational anxiety.   She denies SI/HI.  No mood changes, irritability or appetite changes.   Hyperlipidemia Taking pravastatin 20 mg daily.   Requests cholesterol checked today as she has been trying to diet and exercise to improve that and would like to see if it has changed her numbers.    ROS: No fever, chills, nausea, vomiting, shortness of breath.    Social: pt is a former smoker, quit 2014  Medications reviewed. Objective:   BP 110/70   Pulse 73   Temp 98.6 F (37 C) (Oral)   Wt 237 lb 12.8 oz (107.9 kg)   SpO2 100%   BMI 36.16 kg/m  Vitals and nursing note reviewed.  General: 53 yo female, NAD  HEENT: NCAT, EOMI, PERRL, MMM  Neck: supple, non-tender  CV: RRR no MRG  Lungs: CTAB, normal effort  Abdomen: soft, NTND, +bs  Skin: warm, dry, no rash  Extremities: warm and well perfused  Neuro: alert, oriented x3, no focal deficits   Assessment & Plan:   Anxiety and depression Stable on current prn xanax. Has not taken the hydroxyzine previously prescribed.  Discussed tapering off Xanax and trying alternate medications vs relaxation methods however patient is resistant at this time.  Reassurance provided and will attempt to wean.  -Rx: Xanax, 4 tabs   Hyperlipidemia Check lipid panel today per pt request.   Reports good compliance on Pravastatin  Counseled on diet and exercise  Orders Placed This Encounter  Procedures  . Lipid Panel   Meds ordered this encounter  Medications  . oxyCODONE-acetaminophen (PERCOCET) 10-325 MG tablet    Sig: Take 1 tablet by mouth every 6 (six) hours as needed for pain.    Dispense:  84 tablet    Refill:  0  . ALPRAZolam (XANAX)  0.5 MG tablet    Sig: Take 1 tablet (0.5 mg total) by mouth at bedtime as needed for anxiety.    Dispense:  4 tablet    Refill:  0   Lovenia Kim, MD [place] PGY-3

## 2018-04-21 NOTE — Patient Instructions (Signed)
It was good seeing you again today.  We checked your cholesterol and I will call you once the results of this return.  I have refilled your chronic pain medications.  Please call clinic if you have any questions.

## 2018-04-22 DIAGNOSIS — E669 Obesity, unspecified: Secondary | ICD-10-CM | POA: Diagnosis not present

## 2018-04-22 LAB — LIPID PANEL
Chol/HDL Ratio: 2.9 ratio (ref 0.0–4.4)
Cholesterol, Total: 183 mg/dL (ref 100–199)
HDL: 63 mg/dL (ref >39)
LDL Calculated: 101 mg/dL — ABNORMAL HIGH (ref 0–99)
Triglycerides: 96 mg/dL (ref 0–149)
VLDL Cholesterol Cal: 19 mg/dL (ref 5–40)

## 2018-04-23 DIAGNOSIS — E669 Obesity, unspecified: Secondary | ICD-10-CM | POA: Diagnosis not present

## 2018-04-24 DIAGNOSIS — E669 Obesity, unspecified: Secondary | ICD-10-CM | POA: Diagnosis not present

## 2018-04-24 NOTE — Assessment & Plan Note (Addendum)
Stable on current prn xanax. Has not taken the hydroxyzine previously prescribed.  Discussed tapering off Xanax and trying alternate medications vs relaxation methods however patient is resistant at this time.  Reassurance provided and will attempt to wean.  -Rx: Xanax, 4 tabs

## 2018-04-24 NOTE — Assessment & Plan Note (Signed)
Check lipid panel today per pt request.   Reports good compliance on Pravastatin  Counseled on diet and exercise

## 2018-04-25 DIAGNOSIS — E669 Obesity, unspecified: Secondary | ICD-10-CM | POA: Diagnosis not present

## 2018-04-26 DIAGNOSIS — E669 Obesity, unspecified: Secondary | ICD-10-CM | POA: Diagnosis not present

## 2018-04-27 DIAGNOSIS — E669 Obesity, unspecified: Secondary | ICD-10-CM | POA: Diagnosis not present

## 2018-04-28 DIAGNOSIS — E669 Obesity, unspecified: Secondary | ICD-10-CM | POA: Diagnosis not present

## 2018-04-29 DIAGNOSIS — E669 Obesity, unspecified: Secondary | ICD-10-CM | POA: Diagnosis not present

## 2018-04-30 DIAGNOSIS — E669 Obesity, unspecified: Secondary | ICD-10-CM | POA: Diagnosis not present

## 2018-05-01 DIAGNOSIS — E669 Obesity, unspecified: Secondary | ICD-10-CM | POA: Diagnosis not present

## 2018-05-02 DIAGNOSIS — E669 Obesity, unspecified: Secondary | ICD-10-CM | POA: Diagnosis not present

## 2018-05-03 DIAGNOSIS — E669 Obesity, unspecified: Secondary | ICD-10-CM | POA: Diagnosis not present

## 2018-05-04 DIAGNOSIS — E669 Obesity, unspecified: Secondary | ICD-10-CM | POA: Diagnosis not present

## 2018-05-05 DIAGNOSIS — E669 Obesity, unspecified: Secondary | ICD-10-CM | POA: Diagnosis not present

## 2018-05-06 DIAGNOSIS — E669 Obesity, unspecified: Secondary | ICD-10-CM | POA: Diagnosis not present

## 2018-05-07 DIAGNOSIS — E669 Obesity, unspecified: Secondary | ICD-10-CM | POA: Diagnosis not present

## 2018-05-08 DIAGNOSIS — E669 Obesity, unspecified: Secondary | ICD-10-CM | POA: Diagnosis not present

## 2018-05-09 DIAGNOSIS — E669 Obesity, unspecified: Secondary | ICD-10-CM | POA: Diagnosis not present

## 2018-05-10 DIAGNOSIS — E669 Obesity, unspecified: Secondary | ICD-10-CM | POA: Diagnosis not present

## 2018-05-11 DIAGNOSIS — E669 Obesity, unspecified: Secondary | ICD-10-CM | POA: Diagnosis not present

## 2018-05-12 DIAGNOSIS — E669 Obesity, unspecified: Secondary | ICD-10-CM | POA: Diagnosis not present

## 2018-05-13 DIAGNOSIS — E669 Obesity, unspecified: Secondary | ICD-10-CM | POA: Diagnosis not present

## 2018-05-14 DIAGNOSIS — E669 Obesity, unspecified: Secondary | ICD-10-CM | POA: Diagnosis not present

## 2018-05-15 ENCOUNTER — Ambulatory Visit: Payer: Medicaid Other

## 2018-05-15 ENCOUNTER — Ambulatory Visit
Admission: RE | Admit: 2018-05-15 | Discharge: 2018-05-15 | Disposition: A | Discharge status: Home / Self Care | Payer: Medicaid Other | Source: Ambulatory Visit | Attending: Family Medicine | Admitting: Family Medicine

## 2018-05-15 DIAGNOSIS — E669 Obesity, unspecified: Secondary | ICD-10-CM | POA: Diagnosis not present

## 2018-05-15 DIAGNOSIS — Z1231 Encounter for screening mammogram for malignant neoplasm of breast: Secondary | ICD-10-CM

## 2018-05-16 DIAGNOSIS — E669 Obesity, unspecified: Secondary | ICD-10-CM | POA: Diagnosis not present

## 2018-05-17 DIAGNOSIS — E669 Obesity, unspecified: Secondary | ICD-10-CM | POA: Diagnosis not present

## 2018-05-18 DIAGNOSIS — E669 Obesity, unspecified: Secondary | ICD-10-CM | POA: Diagnosis not present

## 2018-05-19 DIAGNOSIS — E669 Obesity, unspecified: Secondary | ICD-10-CM | POA: Diagnosis not present

## 2018-05-20 DIAGNOSIS — E669 Obesity, unspecified: Secondary | ICD-10-CM | POA: Diagnosis not present

## 2018-05-21 DIAGNOSIS — E669 Obesity, unspecified: Secondary | ICD-10-CM | POA: Diagnosis not present

## 2018-05-22 DIAGNOSIS — E669 Obesity, unspecified: Secondary | ICD-10-CM | POA: Diagnosis not present

## 2018-05-23 DIAGNOSIS — E669 Obesity, unspecified: Secondary | ICD-10-CM | POA: Diagnosis not present

## 2018-05-24 ENCOUNTER — Ambulatory Visit: Payer: Medicaid Other | Admitting: Family Medicine

## 2018-05-24 ENCOUNTER — Other Ambulatory Visit: Payer: Self-pay | Admitting: Family Medicine

## 2018-05-24 DIAGNOSIS — E669 Obesity, unspecified: Secondary | ICD-10-CM | POA: Diagnosis not present

## 2018-05-24 MED ORDER — LYRICA 150 MG PO CAPS: 150.0000 mg | ORAL_CAPSULE | Freq: Two times a day (BID) | ORAL | 3 refills | Status: DC

## 2018-05-24 MED ORDER — ONDANSETRON HCL 4 MG PO TABS: 4.0000 mg | ORAL_TABLET | Freq: Three times a day (TID) | ORAL | 0 refills | Status: DC | PRN

## 2018-05-24 MED ORDER — FUROSEMIDE 40 MG PO TABS: 40.0000 mg | ORAL_TABLET | Freq: Every day | ORAL | 0 refills | Status: DC

## 2018-05-24 MED ORDER — QUETIAPINE FUMARATE 50 MG PO TABS: 50.0000 mg | ORAL_TABLET | Freq: Every day | ORAL | 3 refills | Status: DC

## 2018-05-24 MED ORDER — SPIRIVA HANDIHALER 18 MCG IN CAPS: 18.0000 ug | ORAL_CAPSULE | Freq: Every day | RESPIRATORY_TRACT | 11 refills | Status: DC | PRN

## 2018-05-24 MED ORDER — OXYCODONE-ACETAMINOPHEN 10-325 MG PO TABS: 1.0000 | ORAL_TABLET | Freq: Four times a day (QID) | ORAL | 0 refills | Status: DC | PRN

## 2018-05-25 DIAGNOSIS — E669 Obesity, unspecified: Secondary | ICD-10-CM | POA: Diagnosis not present

## 2018-05-26 DIAGNOSIS — E669 Obesity, unspecified: Secondary | ICD-10-CM | POA: Diagnosis not present

## 2018-05-27 DIAGNOSIS — E669 Obesity, unspecified: Secondary | ICD-10-CM | POA: Diagnosis not present

## 2018-05-28 DIAGNOSIS — E669 Obesity, unspecified: Secondary | ICD-10-CM | POA: Diagnosis not present

## 2018-05-29 DIAGNOSIS — E669 Obesity, unspecified: Secondary | ICD-10-CM | POA: Diagnosis not present

## 2018-05-30 DIAGNOSIS — E669 Obesity, unspecified: Secondary | ICD-10-CM | POA: Diagnosis not present

## 2018-05-31 DIAGNOSIS — E669 Obesity, unspecified: Secondary | ICD-10-CM | POA: Diagnosis not present

## 2018-06-01 DIAGNOSIS — E669 Obesity, unspecified: Secondary | ICD-10-CM | POA: Diagnosis not present

## 2018-06-02 DIAGNOSIS — E669 Obesity, unspecified: Secondary | ICD-10-CM | POA: Diagnosis not present

## 2018-06-03 DIAGNOSIS — E669 Obesity, unspecified: Secondary | ICD-10-CM | POA: Diagnosis not present

## 2018-06-04 DIAGNOSIS — E669 Obesity, unspecified: Secondary | ICD-10-CM | POA: Diagnosis not present

## 2018-06-05 DIAGNOSIS — E669 Obesity, unspecified: Secondary | ICD-10-CM | POA: Diagnosis not present

## 2018-06-06 DIAGNOSIS — E669 Obesity, unspecified: Secondary | ICD-10-CM | POA: Diagnosis not present

## 2018-06-07 DIAGNOSIS — E669 Obesity, unspecified: Secondary | ICD-10-CM | POA: Diagnosis not present

## 2018-06-08 DIAGNOSIS — E669 Obesity, unspecified: Secondary | ICD-10-CM | POA: Diagnosis not present

## 2018-06-09 DIAGNOSIS — E669 Obesity, unspecified: Secondary | ICD-10-CM | POA: Diagnosis not present

## 2018-06-10 DIAGNOSIS — E669 Obesity, unspecified: Secondary | ICD-10-CM | POA: Diagnosis not present

## 2018-06-11 DIAGNOSIS — E669 Obesity, unspecified: Secondary | ICD-10-CM | POA: Diagnosis not present

## 2018-06-12 DIAGNOSIS — E669 Obesity, unspecified: Secondary | ICD-10-CM | POA: Diagnosis not present

## 2018-06-13 DIAGNOSIS — E669 Obesity, unspecified: Secondary | ICD-10-CM | POA: Diagnosis not present

## 2018-06-14 DIAGNOSIS — E669 Obesity, unspecified: Secondary | ICD-10-CM | POA: Diagnosis not present

## 2018-06-15 DIAGNOSIS — E669 Obesity, unspecified: Secondary | ICD-10-CM | POA: Diagnosis not present

## 2018-06-16 DIAGNOSIS — E669 Obesity, unspecified: Secondary | ICD-10-CM | POA: Diagnosis not present

## 2018-06-17 DIAGNOSIS — E669 Obesity, unspecified: Secondary | ICD-10-CM | POA: Diagnosis not present

## 2018-06-18 DIAGNOSIS — E669 Obesity, unspecified: Secondary | ICD-10-CM | POA: Diagnosis not present

## 2018-06-19 ENCOUNTER — Other Ambulatory Visit: Payer: Self-pay | Admitting: Family Medicine

## 2018-06-19 DIAGNOSIS — E669 Obesity, unspecified: Secondary | ICD-10-CM | POA: Diagnosis not present

## 2018-06-19 NOTE — Telephone Encounter (Signed)
PT called for a refill on these meds, Oxycodone, ALAPRAZolam (ZANAX), clobetasol cream, pravastatin, seroquel needs refilling. Please give pt a call back for clarity.

## 2018-06-20 ENCOUNTER — Telehealth (INDEPENDENT_AMBULATORY_CARE_PROVIDER_SITE_OTHER): Payer: Medicaid Other | Admitting: Family Medicine

## 2018-06-20 ENCOUNTER — Telehealth: Payer: Self-pay | Admitting: *Deleted

## 2018-06-20 ENCOUNTER — Other Ambulatory Visit: Payer: Self-pay

## 2018-06-20 ENCOUNTER — Ambulatory Visit: Payer: Medicaid Other | Admitting: Family Medicine

## 2018-06-20 DIAGNOSIS — F329 Major depressive disorder, single episode, unspecified: Secondary | ICD-10-CM

## 2018-06-20 DIAGNOSIS — F419 Anxiety disorder, unspecified: Secondary | ICD-10-CM | POA: Diagnosis not present

## 2018-06-20 DIAGNOSIS — L301 Dyshidrosis [pompholyx]: Secondary | ICD-10-CM

## 2018-06-20 DIAGNOSIS — M5126 Other intervertebral disc displacement, lumbar region: Secondary | ICD-10-CM

## 2018-06-20 DIAGNOSIS — E669 Obesity, unspecified: Secondary | ICD-10-CM | POA: Diagnosis not present

## 2018-06-20 DIAGNOSIS — E785 Hyperlipidemia, unspecified: Secondary | ICD-10-CM

## 2018-06-20 MED ORDER — ALPRAZOLAM 0.5 MG PO TABS: 0.5000 mg | ORAL_TABLET | Freq: Every evening | ORAL | 0 refills | Status: DC | PRN

## 2018-06-20 MED ORDER — CLOBETASOL PROPIONATE 0.05 % EX CREA: TOPICAL_CREAM | Freq: Two times a day (BID) | CUTANEOUS | 6 refills | Status: DC

## 2018-06-20 MED ORDER — OXYCODONE-ACETAMINOPHEN 10-325 MG PO TABS: 1.0000 | ORAL_TABLET | Freq: Four times a day (QID) | ORAL | 0 refills | Status: DC | PRN

## 2018-06-20 MED ORDER — QUETIAPINE FUMARATE 50 MG PO TABS: 50.0000 mg | ORAL_TABLET | Freq: Every day | ORAL | 3 refills | Status: DC

## 2018-06-20 MED ORDER — PRAVASTATIN SODIUM 20 MG PO TABS: 20.0000 mg | ORAL_TABLET | Freq: Every day | ORAL | 3 refills | Status: DC

## 2018-06-20 NOTE — Assessment & Plan Note (Signed)
Refill of clobetasol cream.

## 2018-06-20 NOTE — Telephone Encounter (Signed)
Upon chart review, patient had telemedicine visit and medications have been refilled by Dr. Sandi Carne.  Lauren Bao Coreas, DO PGY-2, [facility]

## 2018-06-20 NOTE — Assessment & Plan Note (Signed)
Well-controlled on current regimen.  Takes Xanax as needed, prescribed 4 tablets, and Seroquel daily.  GAD 7 score 0.  Per last note, Dr. Reesa Chew noted that she would like to begin weaning patient on Xanax.  For now, we will continue with current regimen, but consider decreasing to 3 tabs in 1 month.  [place] PMP reviewed without red flags. -Xanax 0.5 mg daily as needed, 4 tabs -Seroquel 50 mg daily -Follow-up in 1 month

## 2018-06-20 NOTE — Progress Notes (Signed)
[facility] Telemedicine Visit  Patient consented to have virtual visit. Method of visit: Telephone  Encounter participants: Patient: [name] - located at home Provider: Cleophas Dunker - located at [facility] Others (if applicable): None  Chief Complaint: f/u Anxiety and Medication Refills  HPI:  Anxiety and depression Patient takes Xanax as needed and has for quite some time.  She also takes Seroquel daily.  She states it is been 3 years since her mother passed, and every month around the 16th on the day of her mother's passing she has increased anxiety.  GAD 7 score 0.  She states that outside of these occurrences she is doing well.  Denies SI/HI.  States that her mood, appetite have been stable.  She is requesting a refill on both Xanax and Seroquel.  Per chart review it appears that patient has been resistant to taking hydroxyzine and Dr. Reesa Chew has been working on weaning patient from Xanax.  Chronic back pain and lymphedema Patient states that she takes oxycodone for her chronic back pain and chronic bilateral lower extremity lymphedema.  She states that she is well controlled on this medication.  Per chart review, patient receives 84 tabs of Percocet 10-325 mg.  Patient is requesting refill of this medication.  Eczema Patient is requesting refill of clobetasol cream.  Patient states that her eczema is currently well controlled.  Hyperlipidemia Patient requesting pravastatin refill.  Most recent lipid panel 04/21/2018.  ROS: per HPI  Pertinent PMHx: COPD, hypothyroidism, dyshidrotic eczema, lumbar disc herniation, hyperlipidemia, anxiety and depression  Exam:  Respiratory: Patient is speaking complete sentences and does not appear to be in respiratory distress over the phone  Assessment/Plan:  Anxiety and depression Well-controlled on current regimen.  Takes Xanax as needed, prescribed 4 tablets, and Seroquel daily.  GAD 7 score 0.  Per last note,  Dr. Reesa Chew noted that she would like to begin weaning patient on Xanax.  For now, we will continue with current regimen, but consider decreasing to 3 tabs in 1 month.  [place] PMP reviewed without red flags. -Xanax 0.5 mg daily as needed, 4 tabs -Seroquel 50 mg daily -Follow-up in 1 month  Lumbar disc herniation Chronic back pain for which patient takes Percocet.  Receives 84 tabs per month.  [place] PMP reviewed without red flags. -Percocet x84 tabs for this month -Follow-up 1 month  Hyperlipidemia Pravastatin refilled.  Last lipid panel 04/24/2018.  Dyshidrotic eczema Refill of clobetasol cream.   Patient scheduled for 1 month follow-up on 5/14 at 9:10 AM.  Time spent during visit with patient: 15 minutes

## 2018-06-20 NOTE — Telephone Encounter (Signed)
Completed PA info in [place] for oxycodone.  Status pending.  Will recheck status in 24 hours. Christen Bame, CMA

## 2018-06-20 NOTE — Assessment & Plan Note (Signed)
Pravastatin refilled.  Last lipid panel 04/24/2018.

## 2018-06-20 NOTE — Telephone Encounter (Signed)
Med approved 06/20/2018 - 12/17/2018.  [facility] informed. Christen Bame, CMA

## 2018-06-20 NOTE — Assessment & Plan Note (Signed)
Chronic back pain for which patient takes Percocet.  Receives 84 tabs per month.  [place] PMP reviewed without red flags. -Percocet x84 tabs for this month -Follow-up 1 month

## 2018-06-21 DIAGNOSIS — E669 Obesity, unspecified: Secondary | ICD-10-CM | POA: Diagnosis not present

## 2018-06-22 DIAGNOSIS — E669 Obesity, unspecified: Secondary | ICD-10-CM | POA: Diagnosis not present

## 2018-06-23 DIAGNOSIS — E669 Obesity, unspecified: Secondary | ICD-10-CM | POA: Diagnosis not present

## 2018-06-24 DIAGNOSIS — E669 Obesity, unspecified: Secondary | ICD-10-CM | POA: Diagnosis not present

## 2018-06-25 DIAGNOSIS — E669 Obesity, unspecified: Secondary | ICD-10-CM | POA: Diagnosis not present

## 2018-06-26 DIAGNOSIS — E669 Obesity, unspecified: Secondary | ICD-10-CM | POA: Diagnosis not present

## 2018-06-27 DIAGNOSIS — E669 Obesity, unspecified: Secondary | ICD-10-CM | POA: Diagnosis not present

## 2018-06-28 DIAGNOSIS — E669 Obesity, unspecified: Secondary | ICD-10-CM | POA: Diagnosis not present

## 2018-06-29 DIAGNOSIS — E669 Obesity, unspecified: Secondary | ICD-10-CM | POA: Diagnosis not present

## 2018-06-30 DIAGNOSIS — E669 Obesity, unspecified: Secondary | ICD-10-CM | POA: Diagnosis not present

## 2018-07-01 DIAGNOSIS — E669 Obesity, unspecified: Secondary | ICD-10-CM | POA: Diagnosis not present

## 2018-07-02 DIAGNOSIS — E669 Obesity, unspecified: Secondary | ICD-10-CM | POA: Diagnosis not present

## 2018-07-03 DIAGNOSIS — E669 Obesity, unspecified: Secondary | ICD-10-CM | POA: Diagnosis not present

## 2018-07-04 DIAGNOSIS — E669 Obesity, unspecified: Secondary | ICD-10-CM | POA: Diagnosis not present

## 2018-07-05 DIAGNOSIS — E669 Obesity, unspecified: Secondary | ICD-10-CM | POA: Diagnosis not present

## 2018-07-06 DIAGNOSIS — E669 Obesity, unspecified: Secondary | ICD-10-CM | POA: Diagnosis not present

## 2018-07-07 DIAGNOSIS — E669 Obesity, unspecified: Secondary | ICD-10-CM | POA: Diagnosis not present

## 2018-07-08 DIAGNOSIS — E669 Obesity, unspecified: Secondary | ICD-10-CM | POA: Diagnosis not present

## 2018-07-09 DIAGNOSIS — E669 Obesity, unspecified: Secondary | ICD-10-CM | POA: Diagnosis not present

## 2018-07-10 DIAGNOSIS — E669 Obesity, unspecified: Secondary | ICD-10-CM | POA: Diagnosis not present

## 2018-07-11 DIAGNOSIS — E669 Obesity, unspecified: Secondary | ICD-10-CM | POA: Diagnosis not present

## 2018-07-12 DIAGNOSIS — E669 Obesity, unspecified: Secondary | ICD-10-CM | POA: Diagnosis not present

## 2018-07-13 DIAGNOSIS — E669 Obesity, unspecified: Secondary | ICD-10-CM | POA: Diagnosis not present

## 2018-07-14 DIAGNOSIS — E669 Obesity, unspecified: Secondary | ICD-10-CM | POA: Diagnosis not present

## 2018-07-15 DIAGNOSIS — E669 Obesity, unspecified: Secondary | ICD-10-CM | POA: Diagnosis not present

## 2018-07-16 DIAGNOSIS — E669 Obesity, unspecified: Secondary | ICD-10-CM | POA: Diagnosis not present

## 2018-07-17 DIAGNOSIS — E669 Obesity, unspecified: Secondary | ICD-10-CM | POA: Diagnosis not present

## 2018-07-18 DIAGNOSIS — E669 Obesity, unspecified: Secondary | ICD-10-CM | POA: Diagnosis not present

## 2018-07-19 DIAGNOSIS — E669 Obesity, unspecified: Secondary | ICD-10-CM | POA: Diagnosis not present

## 2018-07-20 ENCOUNTER — Other Ambulatory Visit: Payer: Self-pay

## 2018-07-20 ENCOUNTER — Telehealth: Payer: Medicaid Other

## 2018-07-20 ENCOUNTER — Telehealth (INDEPENDENT_AMBULATORY_CARE_PROVIDER_SITE_OTHER): Payer: Medicaid Other | Admitting: Family Medicine

## 2018-07-20 DIAGNOSIS — M5126 Other intervertebral disc displacement, lumbar region: Secondary | ICD-10-CM

## 2018-07-20 DIAGNOSIS — Z760 Encounter for issue of repeat prescription: Secondary | ICD-10-CM | POA: Diagnosis not present

## 2018-07-20 DIAGNOSIS — L732 Hidradenitis suppurativa: Secondary | ICD-10-CM

## 2018-07-20 DIAGNOSIS — E890 Postprocedural hypothyroidism: Secondary | ICD-10-CM

## 2018-07-20 DIAGNOSIS — E669 Obesity, unspecified: Secondary | ICD-10-CM | POA: Diagnosis not present

## 2018-07-20 DIAGNOSIS — E038 Other specified hypothyroidism: Secondary | ICD-10-CM

## 2018-07-20 DIAGNOSIS — F32A Depression, unspecified: Secondary | ICD-10-CM

## 2018-07-20 DIAGNOSIS — F329 Major depressive disorder, single episode, unspecified: Secondary | ICD-10-CM

## 2018-07-20 MED ORDER — QUETIAPINE FUMARATE 50 MG PO TABS: 50.0000 mg | ORAL_TABLET | Freq: Every day | ORAL | 0 refills | Status: DC

## 2018-07-20 MED ORDER — ALPRAZOLAM 0.5 MG PO TABS: 0.5000 mg | ORAL_TABLET | Freq: Every evening | ORAL | 0 refills | Status: DC | PRN

## 2018-07-20 MED ORDER — LEVOTHYROXINE SODIUM 200 MCG PO TABS: 200.0000 ug | ORAL_TABLET | Freq: Every day | ORAL | 0 refills | Status: DC

## 2018-07-20 MED ORDER — OXYCODONE-ACETAMINOPHEN 10-325 MG PO TABS: 1.0000 | ORAL_TABLET | Freq: Four times a day (QID) | ORAL | 0 refills | Status: DC | PRN

## 2018-07-20 NOTE — Assessment & Plan Note (Signed)
Chronic.  Concerning for suboptimal dosing based on elevated TSH in September 2019.  Does not appear to be related to nonadherence. - Given refill for Synthroid 200 mcg daily for 1 month and instructed to follow-up for follow-up appointment on 5/18 for TSH, will adjust accordingly

## 2018-07-20 NOTE — Assessment & Plan Note (Signed)
History of surgical intervention with lancing.  Unable to examine over the phone.  Will require physical exam to ascertain if there is an abscess present. - Patient scheduled to follow-up on 5/18 at which point we will consider antibiotics and/or lancing if indicated

## 2018-07-20 NOTE — Progress Notes (Signed)
[place] Telemedicine Visit  Patient consented to have virtual visit. Method of visit: Telephone (patient's request)  Encounter participants: Patient: [name] - located at home Provider: Hilda Bing - located at office  Chief Complaint: "need medications refilled"  HPI:  [name] is an established patient of Dr. Reesa Chew who likes to be seen every month.  She is requesting refills on several medications.  Please include controlled substances which she has been requiring chronically including Percocet for low back pain secondary to lymphedema and Xanax for anxiety, particularly around this time of the year which is the anniversary of her mother's passing.  It appears that Dr. Reesa Chew is working on weaning his Heidel off of the Xanax.  She is also taking Seroquel for sleep which she feels is helping.  She also is requesting a refill of her Synthroid 200 mcg daily which she endorses good adherence with, however her last TSH back in September 2019 revealed suboptimal dosing.  Patient also reports a recurrence of her right groin boil which was lanced in the clinic a few months ago.  She also was seen by general surgery under Dr. Windle Guard back in July 2019.  She feels that there is no redness or swelling at the site and that boil has improved.  She is requesting antibiotic over the phone.  ROS: per HPI  Pertinent PMHx: Reviewed  Exam:  Respiratory: Talking in complete sentences  Assessment/Plan:  Lumbar disc herniation Chronic requiring opioid use.  Stable on a 4 tablets of Percocet monthly.  PDMP reviewed, no red flags. - Given 1 month electronic refill for #84 tabs Percocet with 0 refills  Hypothyroidism Chronic.  Concerning for suboptimal dosing based on elevated TSH in September 2019.  Does not appear to be related to nonadherence. - Given refill for Synthroid 200 mcg daily for 1 month and instructed to follow-up for follow-up appointment on 5/18 for  TSH, will adjust accordingly  Anxiety and depression Gad 7 score 4, not difficult at all.  Seems to be triggered during anniversary of mother's death.  Currently working on benzo wean.  Tolerating Seroquel.  Concomitant use of opioids. - Given electronic refill for Xanax, then 4 tabs with 0 refills - Scheduled follow-up appointment with PCP on 5/21  HIDRADENITIS SUPPURATIVA History of surgical intervention with lancing.  Unable to examine over the phone.  Will require physical exam to ascertain if there is an abscess present. - Patient scheduled to follow-up on 5/18 at which point we will consider antibiotics and/or lancing if indicated    Time spent during visit with patient: 25 minutes

## 2018-07-20 NOTE — Assessment & Plan Note (Addendum)
Gad 7 score 4, not difficult at all.  Seems to be triggered during anniversary of mother's death.  Currently working on benzo wean.  Tolerating Seroquel.  Concomitant use of opioids. - Given electronic refill for Xanax, then 4 tabs with 0 refills - Scheduled follow-up appointment with PCP on 5/21

## 2018-07-20 NOTE — Assessment & Plan Note (Signed)
Chronic requiring opioid use.  Stable on a 4 tablets of Percocet monthly.  PDMP reviewed, no red flags. - Given 1 month electronic refill for #84 tabs Percocet with 0 refills

## 2018-07-21 DIAGNOSIS — E669 Obesity, unspecified: Secondary | ICD-10-CM | POA: Diagnosis not present

## 2018-07-22 DIAGNOSIS — E669 Obesity, unspecified: Secondary | ICD-10-CM | POA: Diagnosis not present

## 2018-07-23 DIAGNOSIS — E669 Obesity, unspecified: Secondary | ICD-10-CM | POA: Diagnosis not present

## 2018-07-24 ENCOUNTER — Ambulatory Visit: Payer: Medicaid Other | Admitting: Family Medicine

## 2018-07-24 ENCOUNTER — Other Ambulatory Visit: Payer: Medicaid Other

## 2018-07-24 DIAGNOSIS — E669 Obesity, unspecified: Secondary | ICD-10-CM | POA: Diagnosis not present

## 2018-07-25 DIAGNOSIS — E669 Obesity, unspecified: Secondary | ICD-10-CM | POA: Diagnosis not present

## 2018-07-26 DIAGNOSIS — E669 Obesity, unspecified: Secondary | ICD-10-CM | POA: Diagnosis not present

## 2018-07-27 ENCOUNTER — Ambulatory Visit: Payer: Medicaid Other | Admitting: Family Medicine

## 2018-07-27 DIAGNOSIS — E669 Obesity, unspecified: Secondary | ICD-10-CM | POA: Diagnosis not present

## 2018-07-28 DIAGNOSIS — E669 Obesity, unspecified: Secondary | ICD-10-CM | POA: Diagnosis not present

## 2018-07-29 DIAGNOSIS — E669 Obesity, unspecified: Secondary | ICD-10-CM | POA: Diagnosis not present

## 2018-07-30 DIAGNOSIS — E669 Obesity, unspecified: Secondary | ICD-10-CM | POA: Diagnosis not present

## 2018-07-31 DIAGNOSIS — E669 Obesity, unspecified: Secondary | ICD-10-CM | POA: Diagnosis not present

## 2018-08-01 ENCOUNTER — Ambulatory Visit: Payer: Medicaid Other | Admitting: Family Medicine

## 2018-08-01 DIAGNOSIS — E669 Obesity, unspecified: Secondary | ICD-10-CM | POA: Diagnosis not present

## 2018-08-02 DIAGNOSIS — E669 Obesity, unspecified: Secondary | ICD-10-CM | POA: Diagnosis not present

## 2018-08-03 DIAGNOSIS — E669 Obesity, unspecified: Secondary | ICD-10-CM | POA: Diagnosis not present

## 2018-08-04 DIAGNOSIS — E669 Obesity, unspecified: Secondary | ICD-10-CM | POA: Diagnosis not present

## 2018-08-05 DIAGNOSIS — E669 Obesity, unspecified: Secondary | ICD-10-CM | POA: Diagnosis not present

## 2018-08-06 DIAGNOSIS — E669 Obesity, unspecified: Secondary | ICD-10-CM | POA: Diagnosis not present

## 2018-08-07 DIAGNOSIS — E669 Obesity, unspecified: Secondary | ICD-10-CM | POA: Diagnosis not present

## 2018-08-08 DIAGNOSIS — E669 Obesity, unspecified: Secondary | ICD-10-CM | POA: Diagnosis not present

## 2018-08-09 DIAGNOSIS — E669 Obesity, unspecified: Secondary | ICD-10-CM | POA: Diagnosis not present

## 2018-08-10 DIAGNOSIS — E669 Obesity, unspecified: Secondary | ICD-10-CM | POA: Diagnosis not present

## 2018-08-11 ENCOUNTER — Other Ambulatory Visit: Payer: Self-pay

## 2018-08-11 ENCOUNTER — Encounter: Payer: Self-pay | Admitting: Family Medicine

## 2018-08-11 ENCOUNTER — Ambulatory Visit (INDEPENDENT_AMBULATORY_CARE_PROVIDER_SITE_OTHER): Payer: Medicaid Other | Admitting: Family Medicine

## 2018-08-11 VITALS — BP 120/74 | HR 97 | Ht 68.0 in | Wt 274.5 lb

## 2018-08-11 DIAGNOSIS — E039 Hypothyroidism, unspecified: Secondary | ICD-10-CM | POA: Diagnosis not present

## 2018-08-11 DIAGNOSIS — E669 Obesity, unspecified: Secondary | ICD-10-CM | POA: Diagnosis not present

## 2018-08-11 DIAGNOSIS — M5126 Other intervertebral disc displacement, lumbar region: Secondary | ICD-10-CM

## 2018-08-11 DIAGNOSIS — Z6841 Body Mass Index (BMI) 40.0 and over, adult: Secondary | ICD-10-CM

## 2018-08-11 LAB — POCT GLYCOSYLATED HEMOGLOBIN (HGB A1C): Hemoglobin A1C: 5.5 % (ref 4.0–5.6)

## 2018-08-11 MED ORDER — OXYCODONE-ACETAMINOPHEN 10-325 MG PO TABS: 1.0000 | ORAL_TABLET | Freq: Four times a day (QID) | ORAL | 0 refills | Status: DC | PRN

## 2018-08-11 NOTE — Progress Notes (Addendum)
   Subjective:   Patient ID: [name]    DOB: 04/04/1965, 53 y.o. female   MRN: [id number]  CC: f/u hypothyroidism, refill chronic pain meds  HPI: [name] is a 53 y.o. female who presents to clinic today for the following issue.  Hypothyroidism Reports recent weight gain and she feels as though her thyroid is off and her medication may need adjustment.  Takes Synthroid at nighttime as this is when she takes all of her medications.  Reports good compliance and has not been out of her medications.  She denies cold intolerance, hair and skin changes She has no appetite increase, she chronically has lymphedema so difficult to say if her leg swelling is worse.   No fever, chills, nausea, vomiting, cough, shortness of breath.   Lumbar disc herniation Long-standing history of back pain with sciatica requiring use of opioids, she denies acute worsening and requests refill of her chronic pain medications.     ROS: See HPI for pertinent ROS.  [place]: Pertinent past medical, surgical, family, and social history were reviewed and updated as appropriate. Smoking status reviewed.  Medications reviewed. Objective:   BP 120/74   Pulse 97   Ht 5\' 8"  (1.727 m)   Wt 274 lb 8 oz (124.5 kg)   LMP 08/04/2018   SpO2 97%   BMI 41.74 kg/m  Vitals and nursing note reviewed.  General: 53 yo female, NAD  HEENT: NCAT, EOMI, PERRL, MMM, oropharynx clear Neck: supple, nontender, no thyromegaly  CV: RRR no MRG  Lungs: CTAB, normal effort  Abdomen: soft, +bs  Extremities: warm and well perfused, chronic lymphedema of bilateral lower extremities  Neuro: alert, oriented x3, no focal deficits   Assessment & Plan:   Hypothyroidism Taking 200 mcg Synthroid daily at bedtime. Last TSH abnormal 5.6 in 11/2017.   -Check TSH today -Reviewed proper way to take synthroid is daily at same time on an empty stomach before breakfast -Instructed her to wait 30 min to 1 hour before eating or drinking anything  besides water -if necessary, will adjust dose based on labs from today   Lumbar disc herniation Stable without acute worsening.  [place] PMP reviewed and no red flags.  We have previously discussed risks and benefits of chronic opioid use for this condition and she is agreeable to further tapering her Percocet over the next year.   -Rx: percocet, 84 tabs 0 refills  Orders Placed This Encounter  Procedures  . TSH  . POCT glycosylated hemoglobin (Hb A1C)    Associate with Z13.1   Meds ordered this encounter  Medications  . oxyCODONE-acetaminophen (PERCOCET) 10-325 MG tablet    Sig: Take 1 tablet by mouth every 6 (six) hours as needed for pain.    Dispense:  84 tablet    Refill:  0   Lovenia Kim, MD [facility] PGY-3 08/19/2018 12:24 PM

## 2018-08-12 DIAGNOSIS — E669 Obesity, unspecified: Secondary | ICD-10-CM | POA: Diagnosis not present

## 2018-08-12 LAB — TSH: TSH: 0.984 u[IU]/mL (ref 0.450–4.500)

## 2018-08-13 DIAGNOSIS — E669 Obesity, unspecified: Secondary | ICD-10-CM | POA: Diagnosis not present

## 2018-08-13 NOTE — Assessment & Plan Note (Signed)
Taking 200 mcg Synthroid daily at bedtime. Last TSH abnormal 5.6 in 11/2017.   -Check TSH today -Reviewed proper way to take synthroid is daily at same time on an empty stomach before breakfast -Instructed her to wait 30 min to 1 hour before eating or drinking anything besides water -if necessary, will adjust dose based on labs from today

## 2018-08-14 ENCOUNTER — Other Ambulatory Visit: Payer: Self-pay | Admitting: Family Medicine

## 2018-08-14 DIAGNOSIS — E669 Obesity, unspecified: Secondary | ICD-10-CM | POA: Diagnosis not present

## 2018-08-15 ENCOUNTER — Telehealth: Payer: Self-pay | Admitting: Family Medicine

## 2018-08-15 DIAGNOSIS — E669 Obesity, unspecified: Secondary | ICD-10-CM | POA: Diagnosis not present

## 2018-08-15 NOTE — Telephone Encounter (Signed)
Called patient to inform her of normal result, she was appreciate of the call.

## 2018-08-15 NOTE — Telephone Encounter (Signed)
Pt is calling to check on the status of her thyroid levels that were taken on 08/11/18. Please call with results.

## 2018-08-16 DIAGNOSIS — E669 Obesity, unspecified: Secondary | ICD-10-CM | POA: Diagnosis not present

## 2018-08-17 DIAGNOSIS — E669 Obesity, unspecified: Secondary | ICD-10-CM | POA: Diagnosis not present

## 2018-08-18 ENCOUNTER — Other Ambulatory Visit: Payer: Self-pay | Admitting: Family Medicine

## 2018-08-18 DIAGNOSIS — F329 Major depressive disorder, single episode, unspecified: Secondary | ICD-10-CM

## 2018-08-18 DIAGNOSIS — E669 Obesity, unspecified: Secondary | ICD-10-CM | POA: Diagnosis not present

## 2018-08-18 DIAGNOSIS — F419 Anxiety disorder, unspecified: Secondary | ICD-10-CM

## 2018-08-19 DIAGNOSIS — E669 Obesity, unspecified: Secondary | ICD-10-CM | POA: Diagnosis not present

## 2018-08-19 NOTE — Assessment & Plan Note (Signed)
Stable without acute worsening.  [name] PMP reviewed and no red flags.  We have previously discussed risks and benefits of chronic opioid use for this condition and she is agreeable to further tapering her Percocet over the next year.   -Rx: percocet, 84 tabs 0 refills

## 2018-08-20 DIAGNOSIS — E669 Obesity, unspecified: Secondary | ICD-10-CM | POA: Diagnosis not present

## 2018-08-21 DIAGNOSIS — E669 Obesity, unspecified: Secondary | ICD-10-CM | POA: Diagnosis not present

## 2018-08-22 DIAGNOSIS — E669 Obesity, unspecified: Secondary | ICD-10-CM | POA: Diagnosis not present

## 2018-08-23 DIAGNOSIS — E669 Obesity, unspecified: Secondary | ICD-10-CM | POA: Diagnosis not present

## 2018-08-24 DIAGNOSIS — E669 Obesity, unspecified: Secondary | ICD-10-CM | POA: Diagnosis not present

## 2018-08-25 ENCOUNTER — Telehealth (INDEPENDENT_AMBULATORY_CARE_PROVIDER_SITE_OTHER): Payer: Medicaid Other | Admitting: Family Medicine

## 2018-08-25 ENCOUNTER — Other Ambulatory Visit: Payer: Self-pay

## 2018-08-25 DIAGNOSIS — F329 Major depressive disorder, single episode, unspecified: Secondary | ICD-10-CM

## 2018-08-25 DIAGNOSIS — M5126 Other intervertebral disc displacement, lumbar region: Secondary | ICD-10-CM | POA: Diagnosis not present

## 2018-08-25 DIAGNOSIS — F419 Anxiety disorder, unspecified: Secondary | ICD-10-CM

## 2018-08-25 DIAGNOSIS — E669 Obesity, unspecified: Secondary | ICD-10-CM | POA: Diagnosis not present

## 2018-08-25 MED ORDER — ALPRAZOLAM 0.5 MG PO TABS: 0.5000 mg | ORAL_TABLET | Freq: Every evening | ORAL | 0 refills | Status: DC | PRN

## 2018-08-25 MED ORDER — OXYCODONE-ACETAMINOPHEN 10-325 MG PO TABS: 1.0000 | ORAL_TABLET | Freq: Four times a day (QID) | ORAL | 0 refills | Status: DC | PRN

## 2018-08-25 NOTE — Progress Notes (Signed)
[facility] Telemedicine Visit  Patient consented to have virtual visit. Method of visit: Telephone  Encounter participants: Patient: [name] - located at home  Provider: Lovenia Kim - located at office Others (if applicable): N/A  Chief Complaint: back pain worse  HPI: Longstanding h/o low back pain, she reports increased pain from her usual.   Has h/o scoliosis and bulging disc with pain radiating into her legs. She denies any recent injury, fall or heavy lifting that may have exacerbated her symptoms.  She denies numbness or tingling in her legs.   No loss of urinary or bowel continence.  She reports she has been seen by orthopedics in the past for her scoliosis but has not followed up in some time.  She requests referral to an orthopedic specialist.  No fever, chills, cough, sob.   ROS: per HPI  Pertinent PMHx: lumbar disc herniation   Exam:  Respiratory: no acute distress, comfortable work of breathing   Assessment/Plan:  Lumbar disc herniation History of low back pain 2/2 degenerative disc disease and disc herniation on prior imaging requiring chronic opioid use.  Reports recent worsening, no red flags per history to suspect cord compression or neurologic emergency.    Imaging reviewed- 2016 MRI unchanged lumbar degenerative disc and facet disease with disc protrusion at L5-S1.   Refill of chronic pain medications provided.  Referral placed to orthopedics per patient request.     Time spent during visit with patient: 15 minutes

## 2018-08-26 DIAGNOSIS — E669 Obesity, unspecified: Secondary | ICD-10-CM | POA: Diagnosis not present

## 2018-08-26 NOTE — Assessment & Plan Note (Signed)
History of low back pain 2/2 degenerative disc disease and disc herniation on prior imaging requiring chronic opioid use.  Reports recent worsening, no red flags per history to suspect cord compression or neurologic emergency.    Imaging reviewed- 2016 MRI unchanged lumbar degenerative disc and facet disease with disc protrusion at L5-S1.   Refill of chronic pain medications provided.  Referral placed to orthopedics per patient request.

## 2018-08-27 DIAGNOSIS — E669 Obesity, unspecified: Secondary | ICD-10-CM | POA: Diagnosis not present

## 2018-08-28 DIAGNOSIS — E669 Obesity, unspecified: Secondary | ICD-10-CM | POA: Diagnosis not present

## 2018-08-29 DIAGNOSIS — E669 Obesity, unspecified: Secondary | ICD-10-CM | POA: Diagnosis not present

## 2018-08-30 DIAGNOSIS — E669 Obesity, unspecified: Secondary | ICD-10-CM | POA: Diagnosis not present

## 2018-08-31 DIAGNOSIS — E669 Obesity, unspecified: Secondary | ICD-10-CM | POA: Diagnosis not present

## 2018-09-01 DIAGNOSIS — E669 Obesity, unspecified: Secondary | ICD-10-CM | POA: Diagnosis not present

## 2018-09-02 DIAGNOSIS — E669 Obesity, unspecified: Secondary | ICD-10-CM | POA: Diagnosis not present

## 2018-09-03 DIAGNOSIS — E669 Obesity, unspecified: Secondary | ICD-10-CM | POA: Diagnosis not present

## 2018-09-04 DIAGNOSIS — E669 Obesity, unspecified: Secondary | ICD-10-CM | POA: Diagnosis not present

## 2018-09-05 ENCOUNTER — Ambulatory Visit: Payer: Medicaid Other | Admitting: Orthopaedic Surgery

## 2018-09-05 DIAGNOSIS — E669 Obesity, unspecified: Secondary | ICD-10-CM | POA: Diagnosis not present

## 2018-09-06 DIAGNOSIS — E669 Obesity, unspecified: Secondary | ICD-10-CM | POA: Diagnosis not present

## 2018-09-07 DIAGNOSIS — E669 Obesity, unspecified: Secondary | ICD-10-CM | POA: Diagnosis not present

## 2018-09-08 DIAGNOSIS — E669 Obesity, unspecified: Secondary | ICD-10-CM | POA: Diagnosis not present

## 2018-09-09 DIAGNOSIS — E669 Obesity, unspecified: Secondary | ICD-10-CM | POA: Diagnosis not present

## 2018-09-10 DIAGNOSIS — E669 Obesity, unspecified: Secondary | ICD-10-CM | POA: Diagnosis not present

## 2018-09-11 DIAGNOSIS — E669 Obesity, unspecified: Secondary | ICD-10-CM | POA: Diagnosis not present

## 2018-09-12 DIAGNOSIS — E669 Obesity, unspecified: Secondary | ICD-10-CM | POA: Diagnosis not present

## 2018-09-13 DIAGNOSIS — E669 Obesity, unspecified: Secondary | ICD-10-CM | POA: Diagnosis not present

## 2018-09-14 DIAGNOSIS — E669 Obesity, unspecified: Secondary | ICD-10-CM | POA: Diagnosis not present

## 2018-09-15 DIAGNOSIS — E669 Obesity, unspecified: Secondary | ICD-10-CM | POA: Diagnosis not present

## 2018-09-16 DIAGNOSIS — E669 Obesity, unspecified: Secondary | ICD-10-CM | POA: Diagnosis not present

## 2018-09-17 DIAGNOSIS — E669 Obesity, unspecified: Secondary | ICD-10-CM | POA: Diagnosis not present

## 2018-09-18 DIAGNOSIS — E669 Obesity, unspecified: Secondary | ICD-10-CM | POA: Diagnosis not present

## 2018-09-19 ENCOUNTER — Encounter: Payer: Self-pay | Admitting: Student in an Organized Health Care Education/Training Program

## 2018-09-19 ENCOUNTER — Other Ambulatory Visit: Payer: Self-pay

## 2018-09-19 ENCOUNTER — Ambulatory Visit (INDEPENDENT_AMBULATORY_CARE_PROVIDER_SITE_OTHER): Payer: Medicaid Other | Admitting: Student in an Organized Health Care Education/Training Program

## 2018-09-19 VITALS — BP 122/76 | HR 66 | Ht 68.0 in | Wt 276.0 lb

## 2018-09-19 DIAGNOSIS — F32A Depression, unspecified: Secondary | ICD-10-CM

## 2018-09-19 DIAGNOSIS — I890 Lymphedema, not elsewhere classified: Secondary | ICD-10-CM

## 2018-09-19 DIAGNOSIS — H1013 Acute atopic conjunctivitis, bilateral: Secondary | ICD-10-CM | POA: Diagnosis not present

## 2018-09-19 DIAGNOSIS — Z0000 Encounter for general adult medical examination without abnormal findings: Secondary | ICD-10-CM | POA: Diagnosis not present

## 2018-09-19 DIAGNOSIS — R1013 Epigastric pain: Secondary | ICD-10-CM | POA: Diagnosis not present

## 2018-09-19 DIAGNOSIS — F419 Anxiety disorder, unspecified: Secondary | ICD-10-CM | POA: Diagnosis not present

## 2018-09-19 DIAGNOSIS — F329 Major depressive disorder, single episode, unspecified: Secondary | ICD-10-CM

## 2018-09-19 DIAGNOSIS — E669 Obesity, unspecified: Secondary | ICD-10-CM | POA: Diagnosis not present

## 2018-09-19 MED ORDER — OLOPATADINE HCL 0.1 % OP SOLN: 1.0000 [drp] | Freq: Two times a day (BID) | OPHTHALMIC | 0 refills | Status: DC

## 2018-09-19 NOTE — Patient Instructions (Signed)
It was a pleasure to see you today!  To summarize our discussion for this visit:  I will refill your prescriptions that are due  I am also prescribing compression stockings for your leg swelling.  Please speak with your GI doctor about your protonix medication and if they recommend stopping it.  I will also prescribe a medication for your allergies.  Some additional health maintenance measures we should update are: Health Maintenance Due  Topic Date Due  . PAP SMEAR-Modifier  05/18/2016  .    Please return to our clinic to see me for your pap smear.  Call the clinic at [phone] if your symptoms worsen or you have any concerns.   Thank you for allowing me to take part in your care,  Dr. Doristine Mango

## 2018-09-19 NOTE — Progress Notes (Signed)
Subjective:    Patient ID: [name], female    DOB: 1965/10/17, 53 y.o.   MRN: [id number]   CC: meet new provider and review meds  HPI:   Patient is feeling well today without specific complaints - we reviewed her medications and she is taking all that are listed as well as benadryl daily for allergies. -allergies: patient has bilateral eye pruritus daily for which she takes benadryl. She denies drowsiness with the medication. Denies blurred vision. Denies discharge from eyes. Has h/o corneal abrasion and bug bite to eye but no trauma for several years. - patient has chronic bilateral lymphedema of lower extremities. It is unchanged from previous visits. She would like to know if she can get surgery for the swelling. Also wants compression stockings.  - she would like a refill of her benzo which she takes for 2 days per month on the anniversary days of her mothers death in 02-01-2023. She is agreeable to one more month of this and then decreasing the dose next month. She is tearful for the day before and after her mother's death 23-May-2022 of the month since 02-01-23) but feels more pleasant the rest of the month without excessive stress and anxiety.  - patient has been taking protonix long term even with a negative h.pylori on recent EGD- she denies symptoms of heartburn. She thinks the medication helps her swallow. I discussed with her the risks of long-term use and she would like to discuss the discontinuation further with her GI doctor prior to discontinuing.  - patient is due for pap smear so we discussed a close follow up visit to complete this procedure. - colonoscopy in 2016 with one hyperplastic polyp and no malignancy detected. Repeat next year.  Smoking status reviewed   ROS: pertinent noted in the HPI   Past Medical History:  Diagnosis Date  . Allergy   . Anxiety   . Arthritis    knees,hands, hip  pt. Denies  . Cellulitis   . Colon polyps 2013   SESSIL SERRATED ADENOMA (ALL  FRAGMENTS)  . Complication of anesthesia    Lowered BP had to keep pt. overnight  . COPD (chronic obstructive pulmonary disease) ([place])   . Depression   . Fibroadenosis breast   . GERD (gastroesophageal reflux disease)    does not take med  . Heart murmur    as child  pt. denies  . Hyperlipidemia    no meds - diet controlled  . Hypothyroidism   . Lymph edema    both legs and feet  . Migraine   . Plantar fasciitis   . Syphilis Treated in early 2012 during hospitalization  . Uterine fibroid   . Varicose veins     Past Surgical History:  Procedure Laterality Date  . COLONOSCOPY  2013  . cyst removed     behind left knee and left cheek  . ENDOMETRIAL ABLATION    . ESOPHAGOGASTRODUODENOSCOPY  2013   normal  . EYE SURGERY     bilarteral  . HYDRADENITIS EXCISION Right 09/13/2017   Procedure: EXCISION HIDRADENITIS GROIN;  Surgeon: Clovis Riley, MD;  Location: [facility];  Service: General;  Laterality: Right;  LMA  . INCISION AND DRAINAGE ABSCESS / HEMATOMA OF BURSA / KNEE / THIGH     right upper thigh - 2008  . NORPLANT REMOVAL     right upper arm - 12/2001  . svd     x 2  . THYROID SURGERY  2004   removed  . TUBAL LIGATION     2003  . WISDOM TOOTH EXTRACTION      Past medical history, surgical, family, and social history reviewed and updated in the EMR as appropriate.  Objective:  BP 122/76   Pulse 66   Ht 5\' 8"  (1.727 m)   Wt 276 lb (125.2 kg)   LMP 09/10/2018   SpO2 99%   BMI 41.97 kg/m   Vitals and nursing note reviewed  General: NAD, pleasant, able to participate in exam Eyes: non-erythematous, no discharge. EOM intact. No obvious injuries.  Extremities: bilateral non-pitting edema diffusely to lower extremities  Skin: warm and dry, no rashes noted Neuro: alert, no obvious focal deficits Psych: Normal affect and mood  Assessment & Plan:    Anxiety and depression Refilled xanax for 1 more month for acute grief over mother's death (month) anniversary.  Will assess next month and likely decrease dose. Patient is amenable to this plan.  Preventative health care Due for pap smear. UTD on colonoscopy until next year. Will return for visit for pap.  Lymphedema Prescribed compression stockings. Recommended against cosmetic surgery to "remove chunks of tissue" Continue lasix  Allergic conjunctivitis Patient has been taking benadryl chronically.  Prescribed Olopatadine eye drops to trial Return in 4 weeks if not improved Would also recommend using lubricating eye drops in addition   Dyspepsia Discussed risks of long-term PPI use. Was prescribed by her GI doctor so she would like to discuss with him prior to discontinuing.  Will reassess at next appointment.     Doristine Mango, [facility] PGY-2

## 2018-09-20 ENCOUNTER — Ambulatory Visit (INDEPENDENT_AMBULATORY_CARE_PROVIDER_SITE_OTHER): Payer: Medicaid Other | Admitting: Orthopaedic Surgery

## 2018-09-20 ENCOUNTER — Ambulatory Visit (INDEPENDENT_AMBULATORY_CARE_PROVIDER_SITE_OTHER): Payer: Medicaid Other

## 2018-09-20 ENCOUNTER — Encounter: Payer: Self-pay | Admitting: Orthopaedic Surgery

## 2018-09-20 VITALS — BP 129/90 | HR 60 | Ht 68.0 in | Wt 274.2 lb

## 2018-09-20 DIAGNOSIS — M545 Low back pain: Secondary | ICD-10-CM | POA: Diagnosis not present

## 2018-09-20 DIAGNOSIS — M4807 Spinal stenosis, lumbosacral region: Secondary | ICD-10-CM | POA: Diagnosis not present

## 2018-09-20 DIAGNOSIS — G8929 Other chronic pain: Secondary | ICD-10-CM

## 2018-09-20 DIAGNOSIS — H1010 Acute atopic conjunctivitis, unspecified eye: Secondary | ICD-10-CM | POA: Insufficient documentation

## 2018-09-20 DIAGNOSIS — E669 Obesity, unspecified: Secondary | ICD-10-CM | POA: Diagnosis not present

## 2018-09-20 MED ORDER — ALPRAZOLAM 0.5 MG PO TABS: 0.5000 mg | ORAL_TABLET | Freq: Every evening | ORAL | 0 refills | Status: DC | PRN

## 2018-09-20 NOTE — Assessment & Plan Note (Signed)
Refilled xanax for 1 more month for acute grief over mother's death (month) anniversary. Will assess next month and likely decrease dose. Patient is amenable to this plan.

## 2018-09-20 NOTE — Assessment & Plan Note (Signed)
Discussed risks of long-term PPI use. Was prescribed by her GI doctor so she would like to discuss with him prior to discontinuing.  Will reassess at next appointment.

## 2018-09-20 NOTE — Assessment & Plan Note (Signed)
Patient has been taking benadryl chronically.  Prescribed Olopatadine eye drops to trial Return in 4 weeks if not improved Would also recommend using lubricating eye drops in addition

## 2018-09-20 NOTE — Progress Notes (Signed)
Office Visit Note   Patient: [name]           Date of Birth: Feb 23, 1966           MRN: [id number] Visit Date: 09/20/2018              Requested by: Carmie End, MD [address] [place],  [place] [address] PCP: Richarda Osmond, DO   Assessment & Plan: Visit Diagnoses:  1. Chronic bilateral low back pain, unspecified whether sciatica present   2. Spinal stenosis of lumbosacral region   3. Morbid obesity  BMI greater than 40  Plan: Previous MRI 2016 showed some disc degeneration with foraminal stenosis on the right.  Right side disc protrusion at L5-S1 with some displacement of the descending S1 nerve root.  She has been on chronic narcotics.  Would recommend proceeding with reimaging of the lumbar spine with MRI scan.  Office follow-up after scan for review.  Follow-Up Instructions: No follow-ups on file.   Orders:  Orders Placed This Encounter  Procedures  . XR Lumbar Spine 2-3 Views  . MR Lumbar Spine w/o contrast   No orders of the defined types were placed in this encounter.     Procedures: No procedures performed   Clinical Data: No additional findings.   Subjective: Chief Complaint  Patient presents with  . Lower Back - Pain    HPI 53 year old female seen with chronic back pain for years.  She takes oxycodone 10/3 2584 tablets every 21 days.  She also takes Lyrica alprazolam.  [place] website shows narcotic 481, sedative 410 stimulant 40 for total score of 330.  She has lymphedema bilateral lower extremities which she states is painful.  She not able to wear support stockings she states it is a get tight.  Positive for obesity restless leg syndrome.  Review of Systems positive for lumbar spondylosis, hyperlipidemia, anxiety and depression, hypothyroidism, tobacco use, lymphedema, COPD, chronic narcotic usage.Hx of syphilis treated.   Otherwise negative as it pertains HPI.  Objective: Vital Signs: BP 129/90 (BP Location: Left Arm,  Patient Position: Sitting, Cuff Size: Normal)   Pulse 60   Ht 5\' 8"  (1.727 m)   Wt 274 lb 3.2 oz (124.4 kg)   LMP 09/10/2018   BMI 41.69 kg/m   Physical Exam Constitutional:      Appearance: She is well-developed.  HENT:     Head: Normocephalic.     Right Ear: External ear normal.     Left Ear: External ear normal.  Eyes:     Pupils: Pupils are equal, round, and reactive to light.  Neck:     Thyroid: No thyromegaly.     Trachea: No tracheal deviation.  Cardiovascular:     Rate and Rhythm: Normal rate.  Pulmonary:     Effort: Pulmonary effort is normal.  Abdominal:     Palpations: Abdomen is soft.     Comments: obesity  Skin:    General: Skin is warm and dry.  Neurological:     Mental Status: She is alert and oriented to person, place, and time.  Psychiatric:        Behavior: Behavior normal.     Ortho Exam negative logroll to the hips she has bilateral lymphedema.  Pulses are not palpable due to lymphedema.  Anterior tib gastrocsoleus are active.  Slow short stride gait.  Negative straight leg raising 90 degrees. Specialty Comments:  No specialty comments available.  Imaging: CLINICAL DATA:  Back and hip pain. Lower extremity radiculopathy. Degenerative disc disease. Herniated disc on prior imaging. Great toe numbness on the RIGHT. Chronic lumbago/low back pain.  EXAM: MRI LUMBAR SPINE WITHOUT CONTRAST  TECHNIQUE: Multiplanar, multisequence MR imaging of the lumbar spine was performed. No intravenous contrast was administered.  COMPARISON:  CT 06/18/2014.  06/16/2011 MRI.  FINDINGS: Segmentation: Numbering used on prior exam preserved. The numbering convention used for this exam termed L5-S1 as the last intervertebral disc space.  Alignment: Unchanged mild dextroconvex curve with the apex at L5. No spondylolisthesis.  Vertebrae: Bone marrow signal shows suppression of normal fatty marrow. This is a nonspecific finding most commonly associated with  obesity, anemia, cigarette smoking or chronic disease.  Conus medullaris: Borderline low at the inferior aspect of the L2 vertebra.  Paraspinal tissues: LEFT extrarenal pelvis incidentally noted, also seen on prior CT. Otherwise normal.  Disc levels:  Disc Signal: L4-L5 and L5-S1 disc degeneration.  L5-S1 vacuum disc.  T11-T12: Sagittal imaging. Shallow circumferential disc bulge without stenosis.  T12-L1 through L2-L3 discs are normal.  L3-L4:  Disc desiccation.  No stenosis.  L4-L5: Severe RIGHT facet arthrosis. Central canal and subarticular zones appear patent. Minimal disc bulging eccentric to the RIGHT. Mild RIGHT foraminal stenosis associated with facet arthrosis and bulging disc potentially affecting the RIGHT L4 nerve. Subarticular zone appears patent.  L5-S1: RIGHT subarticular zone and RIGHT central disc protrusion that appears similar to the prior exam. This contacts the RIGHT descending S1 nerve as it approaches the lateral recess. The central canal appears adequately patent and the LEFT subarticular zone is patent. Neural foramina are both patent.  IMPRESSION: Unchanged lumbar degenerative disc and facet disease. RIGHT foraminal stenosis at L4-L5 remains present potentially affecting the RIGHT L4 nerve. Right-sided disc protrusion at L5-S1 continues to posteriorly displace the descending RIGHT S1 nerve.   Electronically Signed   By: Dereck Ligas [name]   On: 07/26/2014 16:40        PMFS History: Patient Active Problem List   Diagnosis Date Noted  . Allergic conjunctivitis   . Postmenopausal bleeding 05/29/2017  . Perimenopausal symptom 11/26/2016  . Vaginal dryness 10/29/2016  . Restless legs 02/23/2016  . Polyuria 02/10/2015  . 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 History of colonic polyps 06/06/2014  . Chronic RLQ pain 06/06/2014  . 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 GERD  (gastroesophageal reflux disease) 06/24/2011  . Dyspepsia 06/24/2011  . Obesity 01/22/2011  . History of irregular menstrual bleeding 04/11/2010  . Anxiety and depression 10/24/2009  . COPD (chronic obstructive pulmonary disease) ([place]) 05/09/2009  . Hyperlipidemia 01/05/2008  . Lymphedema 01/04/2007  . HIDRADENITIS SUPPURATIVA 08/17/2006  . Hypothyroidism 05/05/2006  . History of tobacco use 05/05/2006  . Episodic mood disorder ([place]) 05/05/2006  . Lumbar disc herniation 05/05/2006   Past Medical History:  Diagnosis Date  . Allergy   . Anxiety   . Arthritis    knees,hands, hip  pt. Denies  . Cellulitis   . Colon polyps 2013   SESSIL SERRATED ADENOMA (ALL FRAGMENTS)  . Complication of anesthesia    Lowered BP had to keep pt. overnight  . COPD (chronic obstructive pulmonary disease) ([place])   . Depression   . Fibroadenosis breast   . GERD (gastroesophageal reflux disease)    does not take med  . Heart murmur    as child  pt. denies  . Hyperlipidemia    no meds - diet controlled  . Hypothyroidism   .  Lymph edema    both legs and feet  . Migraine   . Plantar fasciitis   . Syphilis Treated in early 2012 during hospitalization  . Uterine fibroid   . Varicose veins     Family History  Problem Relation Age of Onset  . Diabetes Father   . Heart disease Father   . Hyperlipidemia Father   . Hypertension Father   . Diabetes Mother   . Hypertension Mother   . Hyperlipidemia Mother   . Diabetes Paternal Grandmother   . Colon cancer Neg Hx   . Esophageal cancer Neg Hx   . Rectal cancer Neg Hx   . Stomach cancer Neg Hx     Past Surgical History:  Procedure Laterality Date  . COLONOSCOPY  2013  . cyst removed     behind left knee and left cheek  . ENDOMETRIAL ABLATION    . ESOPHAGOGASTRODUODENOSCOPY  2013   normal  . EYE SURGERY     bilarteral  . HYDRADENITIS EXCISION Right 09/13/2017   Procedure: EXCISION HIDRADENITIS GROIN;  Surgeon: Clovis Riley, MD;  Location:  [facility];  Service: General;  Laterality: Right;  LMA  . INCISION AND DRAINAGE ABSCESS / HEMATOMA OF BURSA / KNEE / THIGH     right upper thigh - 2008  . NORPLANT REMOVAL     right upper arm - 12/2001  . svd     x 2  . THYROID SURGERY  2004   removed  . TUBAL LIGATION     2003  . WISDOM TOOTH EXTRACTION     Social History   Occupational History  . Occupation: disabled  Tobacco Use  . Smoking status: Former Smoker    Packs/day: 0.50    Years: 25.00    Pack years: 12.50    Types: Cigarettes    Quit date: 06/30/2012    Years since quitting: 6.2  . Smokeless tobacco: Never Used  Substance and Sexual Activity  . Alcohol use: Not Currently    Comment: rare  . Drug use: Not Currently    Types: Marijuana    Comment: past hx 30yrs ago  . Sexual activity: Yes    Birth control/protection: Surgical

## 2018-09-20 NOTE — Assessment & Plan Note (Signed)
Due for pap smear. UTD on colonoscopy until next year. Will return for visit for pap.

## 2018-09-20 NOTE — Assessment & Plan Note (Addendum)
Prescribed compression stockings. Recommended against cosmetic surgery to "remove chunks of tissue" Continue lasix

## 2018-09-21 DIAGNOSIS — E669 Obesity, unspecified: Secondary | ICD-10-CM | POA: Diagnosis not present

## 2018-09-22 ENCOUNTER — Telehealth: Payer: Self-pay | Admitting: Student in an Organized Health Care Education/Training Program

## 2018-09-22 DIAGNOSIS — F32A Depression, unspecified: Secondary | ICD-10-CM

## 2018-09-22 DIAGNOSIS — L301 Dyshidrosis [pompholyx]: Secondary | ICD-10-CM

## 2018-09-22 DIAGNOSIS — E669 Obesity, unspecified: Secondary | ICD-10-CM | POA: Diagnosis not present

## 2018-09-22 DIAGNOSIS — F329 Major depressive disorder, single episode, unspecified: Secondary | ICD-10-CM

## 2018-09-22 MED ORDER — CLOBETASOL PROPIONATE 0.05 % EX CREA: TOPICAL_CREAM | Freq: Two times a day (BID) | CUTANEOUS | 3 refills | Status: DC

## 2018-09-22 MED ORDER — QUETIAPINE FUMARATE 50 MG PO TABS: 50.0000 mg | ORAL_TABLET | Freq: Every day | ORAL | 0 refills | Status: DC

## 2018-09-22 NOTE — Telephone Encounter (Signed)
Pt would like to have her Seroquel and Temovate refilled.

## 2018-09-23 DIAGNOSIS — E669 Obesity, unspecified: Secondary | ICD-10-CM | POA: Diagnosis not present

## 2018-09-24 DIAGNOSIS — E669 Obesity, unspecified: Secondary | ICD-10-CM | POA: Diagnosis not present

## 2018-09-25 DIAGNOSIS — E669 Obesity, unspecified: Secondary | ICD-10-CM | POA: Diagnosis not present

## 2018-09-26 ENCOUNTER — Other Ambulatory Visit: Payer: Self-pay | Admitting: Student in an Organized Health Care Education/Training Program

## 2018-09-26 DIAGNOSIS — E669 Obesity, unspecified: Secondary | ICD-10-CM | POA: Diagnosis not present

## 2018-09-27 DIAGNOSIS — E669 Obesity, unspecified: Secondary | ICD-10-CM | POA: Diagnosis not present

## 2018-09-28 DIAGNOSIS — E669 Obesity, unspecified: Secondary | ICD-10-CM | POA: Diagnosis not present

## 2018-09-29 ENCOUNTER — Telehealth: Payer: Self-pay | Admitting: *Deleted

## 2018-09-29 DIAGNOSIS — E669 Obesity, unspecified: Secondary | ICD-10-CM | POA: Diagnosis not present

## 2018-09-29 NOTE — Telephone Encounter (Signed)
Prior approval for seroquel completed via [employer] (ASAP MED).  Med approved for 09/29/2018 - 09/29/2019.  Prior approval # [id number].  [facility] pharmacy informed.  Christen Bame, CMA

## 2018-09-29 NOTE — Telephone Encounter (Signed)
Pt is calling and said that [facility] said they never received her refill of Seroquel. It shows that it was delivered on 09/22/18. Pt would like to know if this can be resent.

## 2018-09-30 DIAGNOSIS — E669 Obesity, unspecified: Secondary | ICD-10-CM | POA: Diagnosis not present

## 2018-09-30 MED ORDER — CLOBETASOL PROPIONATE 0.05 % EX CREA: TOPICAL_CREAM | Freq: Two times a day (BID) | CUTANEOUS | 3 refills | Status: DC

## 2018-09-30 MED ORDER — QUETIAPINE FUMARATE 50 MG PO TABS: 50.0000 mg | ORAL_TABLET | Freq: Every day | ORAL | 0 refills | Status: DC

## 2018-09-30 NOTE — Addendum Note (Signed)
Addended by: Richarda Osmond on: 09/30/2018 05:32 PM   Modules accepted: Orders

## 2018-10-01 DIAGNOSIS — E669 Obesity, unspecified: Secondary | ICD-10-CM | POA: Diagnosis not present

## 2018-10-02 DIAGNOSIS — E669 Obesity, unspecified: Secondary | ICD-10-CM | POA: Diagnosis not present

## 2018-10-03 ENCOUNTER — Other Ambulatory Visit: Payer: Self-pay

## 2018-10-03 ENCOUNTER — Ambulatory Visit (INDEPENDENT_AMBULATORY_CARE_PROVIDER_SITE_OTHER): Payer: Medicaid Other | Admitting: Student in an Organized Health Care Education/Training Program

## 2018-10-03 ENCOUNTER — Other Ambulatory Visit (HOSPITAL_COMMUNITY)
Admission: RE | Admit: 2018-10-03 | Discharge: 2018-10-03 | Disposition: A | Discharge status: Home / Self Care | Payer: Medicaid Other | Source: Ambulatory Visit | Attending: Family Medicine | Admitting: Family Medicine

## 2018-10-03 VITALS — BP 122/80 | HR 84 | Wt 267.2 lb

## 2018-10-03 DIAGNOSIS — Z01419 Encounter for gynecological examination (general) (routine) without abnormal findings: Secondary | ICD-10-CM

## 2018-10-03 DIAGNOSIS — E669 Obesity, unspecified: Secondary | ICD-10-CM | POA: Diagnosis not present

## 2018-10-03 DIAGNOSIS — Z113 Encounter for screening for infections with a predominantly sexual mode of transmission: Secondary | ICD-10-CM | POA: Insufficient documentation

## 2018-10-03 DIAGNOSIS — R300 Dysuria: Secondary | ICD-10-CM | POA: Diagnosis not present

## 2018-10-03 DIAGNOSIS — Z0000 Encounter for general adult medical examination without abnormal findings: Secondary | ICD-10-CM

## 2018-10-03 LAB — POCT URINALYSIS DIP (MANUAL ENTRY)
Blood, UA: NEGATIVE
Glucose, UA: NEGATIVE mg/dL
Ketones, POC UA: NEGATIVE mg/dL
Leukocytes, UA: NEGATIVE
Nitrite, UA: NEGATIVE
Protein Ur, POC: NEGATIVE mg/dL
Spec Grav, UA: >=1.03 — AB (ref 1.010–1.025)
Urobilinogen, UA: 1 E.U./dL
pH, UA: 5.5 (ref 5.0–8.0)

## 2018-10-03 LAB — POCT WET PREP (WET MOUNT)
Clue Cells Wet Prep Whiff POC: NEGATIVE
Trichomonas Wet Prep HPF POC: ABSENT

## 2018-10-03 MED ORDER — PREMARIN 0.625 MG/GM VA CREA: 1.0000 | TOPICAL_CREAM | Freq: Every day | VAGINAL | 0 refills | Status: DC

## 2018-10-03 NOTE — Patient Instructions (Addendum)
It was a pleasure to see you today!  To summarize our discussion for this visit:  We performed a pap smear and collected test for STIs today.   I will contact you for the results of those tests likely later this week.  Thank you for taking the steps to screen for cervical cancer! Your exam looked normal to me but I will check a urinalysis for infection and prescribe a cream to help with the dryness.  Some additional health maintenance measures we should update are: Health Maintenance Due  Topic Date Due  . PAP SMEAR-Modifier  05/18/2016  .    Please return to our clinic to see me as needed.  Call the clinic at [phone] if your symptoms worsen or you have any concerns.   Thank you for allowing me to take part in your care,  Dr. Doristine Mango

## 2018-10-03 NOTE — Progress Notes (Signed)
   Subjective:    Patient ID: [name], female    DOB: 11-Jul-1965, 53 y.o.   MRN: [id number]   CC: Pelvic exam  HPI:  Patient is due for Pap smear today.  Last exam was in 2015 without lesions or HPV.  Patient agreeable to Pap smear collection today and also requests STI screening.  Denies any promiscuous activity recently.  Endorses dysuria without hematuria.  Denies vaginal discharge at this time. Denies pruritus.  Endorses vaginal dryness which was not improved with previously prescribed suppositories.  Endorses frequent yeast infections.  Patient states that she no longer has a clitoris because of swelling with previous yeast infection.    Smoking status reviewed   ROS: pertinent noted in the HPI   Past medical history, surgical, family, and social history reviewed and updated in the EMR as appropriate.  Objective:  BP 122/80   Pulse 84   Wt 267 lb 3.2 oz (121.2 kg)   LMP 09/10/2018   SpO2 98%   BMI 40.63 kg/m   Vitals and nursing note reviewed  General: NAD, pleasant, able to participate in exam Pelvic: patient had normal external genitalia. I did not appreciate significant vaginal atrophy even though it is noted in her problem list. There was pubic hair and rugae present to labia. Patient did not endorse any tenderness to the exam. Some mucous discharge was present. Cervical os had mild bleed s/p sample collection Extremities: chronic pitting edema, no cyanosis. Skin: warm and dry, no rashes noted Neuro: alert, no obvious focal deficits Psych: Normal affect and mood   Assessment & Plan:    Pap smear, low-risk Last exam 2015 neg for lesions or HPV This exam neg for lesions as well Called patient to inform. STI screening negative. Urinalysis positive only for high specific gravity which is likely secondary to patient having recent bladder emptying and concentrated sample. Recommended patient drink more water. Prescribed a topical vaginal estrogen cream for 21  days due to patient stated dyspareunia.  Patient endorsed no negative side effects from the cream and good tolerability.      Doristine Mango, [facility] PGY-2

## 2018-10-04 DIAGNOSIS — E669 Obesity, unspecified: Secondary | ICD-10-CM | POA: Diagnosis not present

## 2018-10-04 LAB — CERVICOVAGINAL ANCILLARY ONLY
Chlamydia: NEGATIVE
Neisseria Gonorrhea: NEGATIVE

## 2018-10-05 DIAGNOSIS — E669 Obesity, unspecified: Secondary | ICD-10-CM | POA: Diagnosis not present

## 2018-10-05 LAB — CYTOLOGY - PAP
Diagnosis: NEGATIVE
HPV: NOT DETECTED

## 2018-10-06 DIAGNOSIS — Z01419 Encounter for gynecological examination (general) (routine) without abnormal findings: Secondary | ICD-10-CM | POA: Insufficient documentation

## 2018-10-06 DIAGNOSIS — E669 Obesity, unspecified: Secondary | ICD-10-CM | POA: Diagnosis not present

## 2018-10-06 NOTE — Assessment & Plan Note (Signed)
Last exam 2015 neg for lesions or HPV This exam neg for lesions as well Called patient to inform. STI screening negative. Urinalysis positive only for high specific gravity which is likely secondary to patient having recent bladder emptying and concentrated sample. Recommended patient drink more water. Prescribed a topical vaginal estrogen cream for 21 days due to patient stated dyspareunia.  Patient endorsed no negative side effects from the cream and good tolerability.

## 2018-10-07 DIAGNOSIS — E669 Obesity, unspecified: Secondary | ICD-10-CM | POA: Diagnosis not present

## 2018-10-08 DIAGNOSIS — E669 Obesity, unspecified: Secondary | ICD-10-CM | POA: Diagnosis not present

## 2018-10-09 DIAGNOSIS — E669 Obesity, unspecified: Secondary | ICD-10-CM | POA: Diagnosis not present

## 2018-10-10 DIAGNOSIS — E669 Obesity, unspecified: Secondary | ICD-10-CM | POA: Diagnosis not present

## 2018-10-11 ENCOUNTER — Other Ambulatory Visit: Payer: Self-pay | Admitting: *Deleted

## 2018-10-11 DIAGNOSIS — E669 Obesity, unspecified: Secondary | ICD-10-CM | POA: Diagnosis not present

## 2018-10-11 DIAGNOSIS — M5126 Other intervertebral disc displacement, lumbar region: Secondary | ICD-10-CM

## 2018-10-11 MED ORDER — OXYCODONE-ACETAMINOPHEN 10-325 MG PO TABS: 1.0000 | ORAL_TABLET | Freq: Three times a day (TID) | ORAL | 0 refills | Status: AC | PRN

## 2018-10-12 DIAGNOSIS — E669 Obesity, unspecified: Secondary | ICD-10-CM | POA: Diagnosis not present

## 2018-10-13 DIAGNOSIS — E669 Obesity, unspecified: Secondary | ICD-10-CM | POA: Diagnosis not present

## 2018-10-14 DIAGNOSIS — E669 Obesity, unspecified: Secondary | ICD-10-CM | POA: Diagnosis not present

## 2018-10-15 DIAGNOSIS — E669 Obesity, unspecified: Secondary | ICD-10-CM | POA: Diagnosis not present

## 2018-10-16 DIAGNOSIS — E669 Obesity, unspecified: Secondary | ICD-10-CM | POA: Diagnosis not present

## 2018-10-17 DIAGNOSIS — E669 Obesity, unspecified: Secondary | ICD-10-CM | POA: Diagnosis not present

## 2018-10-18 DIAGNOSIS — E669 Obesity, unspecified: Secondary | ICD-10-CM | POA: Diagnosis not present

## 2018-10-19 DIAGNOSIS — E669 Obesity, unspecified: Secondary | ICD-10-CM | POA: Diagnosis not present

## 2018-10-20 DIAGNOSIS — E669 Obesity, unspecified: Secondary | ICD-10-CM | POA: Diagnosis not present

## 2018-10-21 DIAGNOSIS — E669 Obesity, unspecified: Secondary | ICD-10-CM | POA: Diagnosis not present

## 2018-10-22 DIAGNOSIS — E669 Obesity, unspecified: Secondary | ICD-10-CM | POA: Diagnosis not present

## 2018-10-23 DIAGNOSIS — E669 Obesity, unspecified: Secondary | ICD-10-CM | POA: Diagnosis not present

## 2018-10-24 ENCOUNTER — Other Ambulatory Visit: Payer: Medicaid Other

## 2018-10-24 DIAGNOSIS — E669 Obesity, unspecified: Secondary | ICD-10-CM | POA: Diagnosis not present

## 2018-10-25 DIAGNOSIS — E669 Obesity, unspecified: Secondary | ICD-10-CM | POA: Diagnosis not present

## 2018-10-26 DIAGNOSIS — E669 Obesity, unspecified: Secondary | ICD-10-CM | POA: Diagnosis not present

## 2018-10-27 ENCOUNTER — Ambulatory Visit: Payer: Medicaid Other | Admitting: Orthopaedic Surgery

## 2018-10-27 DIAGNOSIS — E669 Obesity, unspecified: Secondary | ICD-10-CM | POA: Diagnosis not present

## 2018-10-28 DIAGNOSIS — E669 Obesity, unspecified: Secondary | ICD-10-CM | POA: Diagnosis not present

## 2018-10-29 DIAGNOSIS — E669 Obesity, unspecified: Secondary | ICD-10-CM | POA: Diagnosis not present

## 2018-10-30 DIAGNOSIS — E669 Obesity, unspecified: Secondary | ICD-10-CM | POA: Diagnosis not present

## 2018-10-31 DIAGNOSIS — E669 Obesity, unspecified: Secondary | ICD-10-CM | POA: Diagnosis not present

## 2018-11-01 ENCOUNTER — Other Ambulatory Visit: Payer: Self-pay | Admitting: Student in an Organized Health Care Education/Training Program

## 2018-11-01 DIAGNOSIS — E669 Obesity, unspecified: Secondary | ICD-10-CM | POA: Diagnosis not present

## 2018-11-02 ENCOUNTER — Other Ambulatory Visit: Payer: Self-pay | Admitting: *Deleted

## 2018-11-02 ENCOUNTER — Ambulatory Visit (INDEPENDENT_AMBULATORY_CARE_PROVIDER_SITE_OTHER): Payer: Medicaid Other | Admitting: Family Medicine

## 2018-11-02 ENCOUNTER — Other Ambulatory Visit: Payer: Self-pay

## 2018-11-02 DIAGNOSIS — L608 Other nail disorders: Secondary | ICD-10-CM | POA: Diagnosis not present

## 2018-11-02 DIAGNOSIS — E669 Obesity, unspecified: Secondary | ICD-10-CM | POA: Diagnosis not present

## 2018-11-02 MED ORDER — BACITRACIN 500 UNIT/GM EX OINT: TOPICAL_OINTMENT | CUTANEOUS | 0 refills | Status: DC

## 2018-11-02 NOTE — Patient Instructions (Addendum)
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  Likely it responds well to treatment.  I would like you to apply his topical antibiotic ointment to the affected fingernails 4 times a day for 2 months at least.  I would also like you to come back at that time to see Dr. Ouida Sills so that she can monitor the progress.  If it does not appear that is improving, we can prescribe an alternative treatment.  Have a great day,  Clemetine Marker, MD

## 2018-11-02 NOTE — Progress Notes (Signed)
   [facility] Phone: [phone]     [name] - 53 y.o. female MRN [id number]  Date of birth: [birth year]  Subjective:   cc: green and white spots on nails.   HPI:  The patient has noticed green discoloration of her left 3rd and 4th fingernails in the past month.  She has an appointment with her PCP last month where she expressed concern about white spots on her fingernails and vertical ridges.  She has not used fake nails since coronavirus started, which would be approximately 6 months.  She has no other complaints.  Fingernails do not hurt, no change in sensation.  ROS: See HPI for pertinent positives and negatives  Past Medical History  Family history reviewed for today's visit. No changes.  Health Maintenance:  -  Health Maintenance Due  Topic Date Due  . INFLUENZA VACCINE  10/07/2018    -  reports that she quit smoking about 6 years ago. Her smoking use included cigarettes. She has a 12.50 pack-year smoking history. She has never used smokeless tobacco.  Objective:   BP 125/80   Pulse 82   Wt 260 lb 9.6 oz (118.2 kg)   SpO2 98%   BMI 39.62 kg/m  Gen: NAD, alert and oriented, cooperative with exam Extremities: left 3rd and 4th nails have light green discoloration.  Does not come off with alcohol.   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  Also on the differential is onychomycosis.   - 2 months of topical bacitracin on affected nails 2-4 times a day.   - re-evaluate in 2 months.    Meds ordered this encounter  Medications  . bacitracin 500 UNIT/GM ointment    Sig: Apply to affect areas 4 times a day for two months    Dispense:  453.9 g    Refill:  0     Clemetine Marker, MD PGY-2 [facility]

## 2018-11-03 ENCOUNTER — Encounter: Payer: Self-pay | Admitting: Family Medicine

## 2018-11-03 DIAGNOSIS — E669 Obesity, unspecified: Secondary | ICD-10-CM | POA: Diagnosis not present

## 2018-11-03 DIAGNOSIS — L608 Other nail disorders: Secondary | ICD-10-CM | POA: Insufficient documentation

## 2018-11-03 NOTE — Assessment & Plan Note (Signed)
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  Also on the differential is onychomycosis.   - 2 months of topical bacitracin on affected nails 2-4 times a day.   - re-evaluate in 2 months.

## 2018-11-04 DIAGNOSIS — E669 Obesity, unspecified: Secondary | ICD-10-CM | POA: Diagnosis not present

## 2018-11-04 MED ORDER — ALBUTEROL SULFATE HFA 108 (90 BASE) MCG/ACT IN AERS: 2.0000 | INHALATION_SPRAY | RESPIRATORY_TRACT | 1 refills | Status: DC | PRN

## 2018-11-05 DIAGNOSIS — E669 Obesity, unspecified: Secondary | ICD-10-CM | POA: Diagnosis not present

## 2018-11-06 DIAGNOSIS — E669 Obesity, unspecified: Secondary | ICD-10-CM | POA: Diagnosis not present

## 2018-11-07 ENCOUNTER — Telehealth: Payer: Self-pay | Admitting: Student in an Organized Health Care Education/Training Program

## 2018-11-07 DIAGNOSIS — E669 Obesity, unspecified: Secondary | ICD-10-CM | POA: Diagnosis not present

## 2018-11-07 NOTE — Telephone Encounter (Signed)
The patient's pharmacy still has not received a prescription she was supposed to get last week for her fingernail issue after her appt.  Her pharmacy is [facility] on [address].  Patient states that if there is a problem with medicaid not covering it, please call in something that is covered by medicaid, thanks.  Best number to call with questions is # [phone].

## 2018-11-08 ENCOUNTER — Other Ambulatory Visit: Payer: Self-pay | Admitting: Family Medicine

## 2018-11-08 DIAGNOSIS — E669 Obesity, unspecified: Secondary | ICD-10-CM | POA: Diagnosis not present

## 2018-11-08 MED ORDER — BACITRACIN-POLYMYXIN B 500-10000 UNIT/GM EX OINT: TOPICAL_OINTMENT | CUTANEOUS | 5 refills | Status: DC

## 2018-11-08 NOTE — Telephone Encounter (Signed)
Re-sent a new Rx for polysporin to the pharmacy listed.

## 2018-11-09 DIAGNOSIS — E669 Obesity, unspecified: Secondary | ICD-10-CM | POA: Diagnosis not present

## 2018-11-10 ENCOUNTER — Other Ambulatory Visit: Payer: Self-pay

## 2018-11-10 ENCOUNTER — Ambulatory Visit (INDEPENDENT_AMBULATORY_CARE_PROVIDER_SITE_OTHER): Payer: Medicaid Other | Admitting: Student in an Organized Health Care Education/Training Program

## 2018-11-10 VITALS — BP 130/80 | HR 78 | Wt 261.0 lb

## 2018-11-10 DIAGNOSIS — M47816 Spondylosis without myelopathy or radiculopathy, lumbar region: Secondary | ICD-10-CM

## 2018-11-10 DIAGNOSIS — Z5181 Encounter for therapeutic drug level monitoring: Secondary | ICD-10-CM

## 2018-11-10 DIAGNOSIS — I890 Lymphedema, not elsewhere classified: Secondary | ICD-10-CM | POA: Diagnosis not present

## 2018-11-10 DIAGNOSIS — E890 Postprocedural hypothyroidism: Secondary | ICD-10-CM | POA: Diagnosis not present

## 2018-11-10 DIAGNOSIS — Z716 Tobacco abuse counseling: Secondary | ICD-10-CM | POA: Diagnosis not present

## 2018-11-10 DIAGNOSIS — N898 Other specified noninflammatory disorders of vagina: Secondary | ICD-10-CM

## 2018-11-10 DIAGNOSIS — E669 Obesity, unspecified: Secondary | ICD-10-CM | POA: Diagnosis not present

## 2018-11-10 DIAGNOSIS — E038 Other specified hypothyroidism: Secondary | ICD-10-CM | POA: Diagnosis not present

## 2018-11-10 MED ORDER — PANTOPRAZOLE SODIUM 40 MG PO TBEC: 40.0000 mg | DELAYED_RELEASE_TABLET | Freq: Two times a day (BID) | ORAL | 2 refills | Status: DC

## 2018-11-10 MED ORDER — LEVOTHYROXINE SODIUM 200 MCG PO TABS: 200.0000 ug | ORAL_TABLET | Freq: Every day | ORAL | 2 refills | Status: DC

## 2018-11-10 NOTE — Patient Instructions (Signed)
It was a pleasure to see you today! You looked so beautiful! To summarize our discussion for this visit:  I confirmed your nail prescription was sent in to your pharmacy and it is an over the counter medication so your insurance will not cover it but the pharmacist can help you find the correct medication in the store.   I have completed your form for handicap placcard  I am providing you with information to help with quitting smoking and I am so proud of you for taking this step!  Please return to our clinic to see me as needed.  Call the clinic at [phone] if your symptoms worsen or you have any concerns.   Thank you for allowing me to take part in your care,  Dr. Doristine Mango

## 2018-11-10 NOTE — Progress Notes (Signed)
   Subjective:    Patient ID: [name], female    DOB: [birth year], 53 y.o.   MRN: [id number]   CC: Follow-up of nail prescription  HPI:  Patient wants to follow-up on her prescription for her nails.  She states that when she went to the pharmacy 2 times they did not have her prescription from her visit earlier in the week.  I called the pharmacy with patient in the office to confirm as our system is showing that the prescriptions have been sent.  Pharmacist let me know that the prescription was in the system but is an over-the-counter medication.  Asked the pharmacist if somebody could assist the patient at the pharmacy in selecting the correct over-the-counter medication. Handicap placard-patient requesting renewal of handicap placard which was rejected at last attempt by prior PCP. Smoking cessation-patient states that she smokes less than a pack of cigarettes per day and is looking to cut back.  She has difficulties with affording the nicotine supplementation and would like some assistance with this. Prune juice consumption and dehydration-patient states that she feels dehydrated.  She would like to know if this is due to her prune juice consumption.  Patient drinks approximately 3 large bottles of prune juice per day and has several watery bowel movements per day.  Discussed with patient decreasing prune juice consumption and monitoring changes in stool habits.  Smoking status reviewed   ROS: pertinent noted in the HPI  Past medical history, surgical, family, and social history reviewed and updated in the EMR as appropriate.  Objective:  BP 130/80   Pulse 78   Wt 261 lb (118.4 kg)   SpO2 98%   BMI 39.68 kg/m   Vitals and nursing note reviewed  General: NAD, pleasant, able to participate in exam CV: Capillary refill quick, skin turgor intact and without prolonged tenting, negative for conjunctival pallor, moist mucous membranes present, positive bilateral lower extremity  unchanged from previous exam Extremities: Bilateral lower extremity edema present or cyanosis. Skin: warm and dry, no rashes noted Neuro: alert, no obvious focal deficits Psych: Normal affect and mood   Assessment & Plan:    Medication monitoring encounter -Called pharmacy with patient to confirm that prescriptions from provider earlier this week were sent in and asked the pharmacist to assist the patient in finding the correct over-the-counter medication when she goes to the pharmacy. - Refilled patient's medications. -   Hypothyroidism Stable.  Refilled medication.   Vaginal dryness Improved.   Recommended lubricants as needed  Spondylosis of lumbar region without myelopathy or radiculopathy -Completed handicap placard paperwork and return to patient -Referred to physical therapy  Encounter for tobacco use cessation counseling -Congratulated patient on taking steps for smoking cessation -Provided with information and resources for 1 800 quit now -Follow-up with patient at next appointment for continued counseling   Doristine Mango, [facility] PGY-2

## 2018-11-10 NOTE — Telephone Encounter (Signed)
Patient calls nurse line stating she forgot to mention to PCP she needs a refill on oxycodone. Please advise.

## 2018-11-11 ENCOUNTER — Encounter: Payer: Self-pay | Admitting: Student in an Organized Health Care Education/Training Program

## 2018-11-11 DIAGNOSIS — Z716 Tobacco abuse counseling: Secondary | ICD-10-CM | POA: Insufficient documentation

## 2018-11-11 DIAGNOSIS — Z5181 Encounter for therapeutic drug level monitoring: Secondary | ICD-10-CM | POA: Insufficient documentation

## 2018-11-11 DIAGNOSIS — E669 Obesity, unspecified: Secondary | ICD-10-CM | POA: Diagnosis not present

## 2018-11-11 MED ORDER — LYRICA 150 MG PO CAPS: 150.0000 mg | ORAL_CAPSULE | Freq: Two times a day (BID) | ORAL | 1 refills | Status: DC

## 2018-11-11 NOTE — Assessment & Plan Note (Addendum)
Improved.   Recommended lubricants as needed

## 2018-11-11 NOTE — Assessment & Plan Note (Signed)
-  Congratulated patient on taking steps for smoking cessation -Provided with information and resources for 1 800 quit now -Follow-up with patient at next appointment for continued counseling

## 2018-11-11 NOTE — Assessment & Plan Note (Signed)
-  Called pharmacy with patient to confirm that prescriptions from provider earlier this week were sent in and asked the pharmacist to assist the patient in finding the correct over-the-counter medication when she goes to the pharmacy. - Refilled patient's medications. -

## 2018-11-11 NOTE — Assessment & Plan Note (Signed)
Stable. Refilled medication.  

## 2018-11-11 NOTE — Assessment & Plan Note (Signed)
-  Completed handicap placard paperwork and return to patient -Referred to physical therapy

## 2018-11-12 DIAGNOSIS — E669 Obesity, unspecified: Secondary | ICD-10-CM | POA: Diagnosis not present

## 2018-11-13 DIAGNOSIS — E669 Obesity, unspecified: Secondary | ICD-10-CM | POA: Diagnosis not present

## 2018-11-14 ENCOUNTER — Telehealth: Payer: Self-pay

## 2018-11-14 DIAGNOSIS — E669 Obesity, unspecified: Secondary | ICD-10-CM | POA: Diagnosis not present

## 2018-11-14 MED ORDER — OXYCODONE-ACETAMINOPHEN 10-325 MG PO TABS: 1.0000 | ORAL_TABLET | Freq: Three times a day (TID) | ORAL | 0 refills | Status: DC | PRN

## 2018-11-14 NOTE — Telephone Encounter (Signed)
Rose from oncology calls nurse line stating the recent referral for patient for Lymphedema is not appropriate for their office. The patient does not have a diagnoses of cancer.   Will forward to our referral coordinator.

## 2018-11-15 DIAGNOSIS — E669 Obesity, unspecified: Secondary | ICD-10-CM | POA: Diagnosis not present

## 2018-11-16 DIAGNOSIS — E669 Obesity, unspecified: Secondary | ICD-10-CM | POA: Diagnosis not present

## 2018-11-17 DIAGNOSIS — E669 Obesity, unspecified: Secondary | ICD-10-CM | POA: Diagnosis not present

## 2018-11-18 DIAGNOSIS — E669 Obesity, unspecified: Secondary | ICD-10-CM | POA: Diagnosis not present

## 2018-11-19 DIAGNOSIS — E669 Obesity, unspecified: Secondary | ICD-10-CM | POA: Diagnosis not present

## 2018-11-20 DIAGNOSIS — E669 Obesity, unspecified: Secondary | ICD-10-CM | POA: Diagnosis not present

## 2018-11-21 DIAGNOSIS — E669 Obesity, unspecified: Secondary | ICD-10-CM | POA: Diagnosis not present

## 2018-11-22 DIAGNOSIS — E669 Obesity, unspecified: Secondary | ICD-10-CM | POA: Diagnosis not present

## 2018-11-23 ENCOUNTER — Ambulatory Visit
Admission: RE | Admit: 2018-11-23 | Discharge: 2018-11-23 | Disposition: A | Discharge status: Home / Self Care | Payer: Medicaid Other | Source: Ambulatory Visit | Attending: Orthopaedic Surgery | Admitting: Orthopaedic Surgery

## 2018-11-23 ENCOUNTER — Other Ambulatory Visit: Payer: Self-pay

## 2018-11-23 DIAGNOSIS — E669 Obesity, unspecified: Secondary | ICD-10-CM | POA: Diagnosis not present

## 2018-11-23 DIAGNOSIS — M48061 Spinal stenosis, lumbar region without neurogenic claudication: Secondary | ICD-10-CM | POA: Diagnosis not present

## 2018-11-23 DIAGNOSIS — M4807 Spinal stenosis, lumbosacral region: Secondary | ICD-10-CM

## 2018-11-24 DIAGNOSIS — E669 Obesity, unspecified: Secondary | ICD-10-CM | POA: Diagnosis not present

## 2018-11-25 DIAGNOSIS — E669 Obesity, unspecified: Secondary | ICD-10-CM | POA: Diagnosis not present

## 2018-11-26 DIAGNOSIS — E669 Obesity, unspecified: Secondary | ICD-10-CM | POA: Diagnosis not present

## 2018-11-27 DIAGNOSIS — E669 Obesity, unspecified: Secondary | ICD-10-CM | POA: Diagnosis not present

## 2018-11-28 ENCOUNTER — Encounter: Payer: Self-pay | Admitting: Orthopaedic Surgery

## 2018-11-28 ENCOUNTER — Ambulatory Visit (INDEPENDENT_AMBULATORY_CARE_PROVIDER_SITE_OTHER): Payer: Medicaid Other | Admitting: Orthopaedic Surgery

## 2018-11-28 ENCOUNTER — Ambulatory Visit (INDEPENDENT_AMBULATORY_CARE_PROVIDER_SITE_OTHER): Payer: Medicaid Other | Admitting: Family Medicine

## 2018-11-28 ENCOUNTER — Encounter: Payer: Self-pay | Admitting: Family Medicine

## 2018-11-28 ENCOUNTER — Other Ambulatory Visit: Payer: Self-pay

## 2018-11-28 VITALS — BP 127/79 | HR 79 | Ht 68.0 in | Wt 261.0 lb

## 2018-11-28 DIAGNOSIS — S060X0A Concussion without loss of consciousness, initial encounter: Secondary | ICD-10-CM | POA: Diagnosis not present

## 2018-11-28 DIAGNOSIS — M5126 Other intervertebral disc displacement, lumbar region: Secondary | ICD-10-CM | POA: Diagnosis not present

## 2018-11-28 DIAGNOSIS — E669 Obesity, unspecified: Secondary | ICD-10-CM | POA: Diagnosis not present

## 2018-11-28 DIAGNOSIS — R600 Localized edema: Secondary | ICD-10-CM | POA: Diagnosis not present

## 2018-11-28 DIAGNOSIS — M47816 Spondylosis without myelopathy or radiculopathy, lumbar region: Secondary | ICD-10-CM | POA: Diagnosis not present

## 2018-11-28 MED ORDER — ONDANSETRON HCL 4 MG PO TABS: 4.0000 mg | ORAL_TABLET | Freq: Three times a day (TID) | ORAL | 0 refills | Status: DC | PRN

## 2018-11-28 NOTE — Progress Notes (Signed)
Office Visit Note   Patient: [name]           Date of Birth: 02-16-1966           MRN: [id number] Visit Date: 11/28/2018              Requested by: Anderson, Chelsey L, DO [place] [address] [place],  [place] [address] PCP: Richarda Osmond, DO   Assessment & Plan: Visit Diagnoses:  1. Spondylosis of lumbar region without myelopathy or radiculopathy   2. Lumbar disc herniation   3. Bilateral leg edema     Plan: Prescription for knee-high teds given.  She has lymphedema likely will need custom order.  Patient is requesting epidural injection for L5-S1 disc protrusion which is slightly progressed since previous MRI imaging in 2005 and 2016.  We will set up for single injection recheck her in 2 months.  Follow-Up Instructions: Return in about 2 months (around 01/28/2019).   Orders:  Orders Placed This Encounter  Procedures  . Ambulatory referral to Physical Medicine Rehab   No orders of the defined types were placed in this encounter.     Procedures: No procedures performed   Clinical Data: No additional findings.   Subjective: Chief Complaint  Patient presents with  . Lower Back - Follow-up    MRI Lumbar Review     HPI 53 year old female returns with ongoing problems with low back pain.  MRI scan is been obtained and is available for review.  Patient states that she previously gotten relief with the epidural and is requesting a repeat epidural.  She continues to have problems with lymphedema.  Does not have any compression stockings.  Lumbar MRI scan is reviewed which shows lumbar spondylosis most notable at L5-S1 with right paracentral disc protrusion contacting the right S1 nerve root.  She has severe right moderate left neuroforaminal narrowing and mild central narrowing.  This is slightly progressed from previous MRI done in 2016.  Review of Systems 14 point review of systems updated unchanged from 09/20/2018 office visit other than as mentioned HPI.    Objective: Vital Signs: BP 127/79   Pulse 79   Ht 5\' 8"  (1.727 m)   Wt 261 lb (118.4 kg)   LMP 11/11/2018 (Exact Date)   BMI 39.68 kg/m   Physical Exam Constitutional:      Appearance: She is well-developed.  HENT:     Head: Normocephalic.     Right Ear: External ear normal.     Left Ear: External ear normal.  Eyes:     Pupils: Pupils are equal, round, and reactive to light.  Neck:     Thyroid: No thyromegaly.     Trachea: No tracheal deviation.  Cardiovascular:     Rate and Rhythm: Normal rate.  Pulmonary:     Effort: Pulmonary effort is normal.  Abdominal:     Palpations: Abdomen is soft.  Skin:    General: Skin is warm and dry.  Neurological:     Mental Status: She is alert and oriented to person, place, and time.  Psychiatric:        Behavior: Behavior normal.     Ortho Exam patient has intact knee and ankle jerk.  She has significant bilateral lower extremity pitting edema.  Specialty Comments:  No specialty comments available.  Imaging: Study Result  CLINICAL DATA:  Chronic lower back pain with bilateral leg pain  EXAM: MRI LUMBAR SPINE WITHOUT CONTRAST  TECHNIQUE: Multiplanar, multisequence MR imaging of  the lumbar spine was performed. No intravenous contrast was administered.  COMPARISON:  Jul 26, 2014  FINDINGS: Segmentation: There are 5 non-rib bearing lumbar type vertebral bodies with the last intervertebral disc space labeled as L5-S1.  Alignment:  Normal  Vertebrae: The vertebral body heights are well maintained. No fracture, marrow edema,or pathologic marrow infiltration.  Conus medullaris and cauda equina: Conus extends to the L1-2 level. Conus and cauda equina appear normal.  Paraspinal and other soft tissues: The paraspinal soft tissues and visualized retroperitoneal structures are unremarkable. The sacroiliac joints are intact.  Disc levels:  T12-L1:  No significant canal or neural foraminal narrowing.  L1-L2:    No significant canal or neural foraminal narrowing.  L2-L3: There is a minimal broad-based disc bulge with ligamentum flavum hypertrophy which causes mild bilateral neural foraminal narrowing.  L3-L4: There is a broad-based disc bulge with ligamentum flavum hypertrophy and facet arthrosis which causes moderate bilateral neural foraminal narrowing. There is mild effacement anterior thecal sac.  L4-L5: There is a broad-based disc bulge with ligamentum flavum hypertrophy and facet arthrosis. There is moderate bilateral neural foraminal narrowing. Narrowing. The central thecal sac appears to be mildly effaced measuring 8 mm in AP diameter.  L5-S1: There is a broad-based disc bulge with a right paracentral disc protrusion which contacts the descending right S1 nerve root. There is facet arthrosis and ligamentum flavum hypertrophy. Severe right and moderate to severe left neural foraminal narrowing is seen. The central thecal sac measures 7 mm in AP diameter.  IMPRESSION: Lumbar spine spondylosis most notable at L5-S1 with a right paracentral disc protrusion which contacts the descending right S1 nerve root. There is also severe right and moderate to severe left neural foraminal narrowing with mild central canal narrowing. This is slightly progressed since the prior exam.   Electronically Signed   By: Prudencio Pair [name]   On: 11/23/2018 13:55      PMFS History: Patient Active Problem List   Diagnosis Date Noted  . Bilateral leg edema 12/01/2018  . Concussion 11/29/2018  . Medication monitoring encounter 11/11/2018  . Encounter for tobacco use cessation counseling 11/11/2018  . Nail discoloration 11/03/2018  . Postmenopausal bleeding 05/29/2017  . Perimenopausal symptom 11/26/2016  . Vaginal dryness 10/29/2016  . Restless legs 02/23/2016  . Polyuria 02/10/2015  . 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 History of colonic polyps  06/06/2014  . 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 GERD (gastroesophageal reflux disease) 06/24/2011  . Dyspepsia 06/24/2011  . Obesity 01/22/2011  . History of irregular menstrual bleeding 04/11/2010  . Anxiety and depression 10/24/2009  . COPD (chronic obstructive pulmonary disease) ([facility]) 05/09/2009  . Hyperlipidemia 01/05/2008  . Lymphedema 01/04/2007  . HIDRADENITIS SUPPURATIVA 08/17/2006  . Hypothyroidism 05/05/2006  . History of tobacco use 05/05/2006  . Episodic mood disorder ([place]) 05/05/2006  . Lumbar disc herniation 05/05/2006   Past Medical History:  Diagnosis Date  . Allergy   . Anxiety   . Arthritis    knees,hands, hip  pt. Denies  . Cellulitis   . Colon polyps 2013   SESSIL SERRATED ADENOMA (ALL FRAGMENTS)  . Complication of anesthesia    Lowered BP had to keep pt. overnight  . COPD (chronic obstructive pulmonary disease) ([place])   . Depression   . Fibroadenosis breast   . GERD (gastroesophageal reflux disease)    does not take med  . Heart murmur    as child  pt. denies  . Hyperlipidemia  no meds - diet controlled  . Hypothyroidism   . Lymph edema    both legs and feet  . Migraine   . Plantar fasciitis   . Syphilis Treated in early 2012 during hospitalization  . Uterine fibroid   . Varicose veins     Family History  Problem Relation Age of Onset  . Diabetes Father   . Heart disease Father   . Hyperlipidemia Father   . Hypertension Father   . Diabetes Mother   . Hypertension Mother   . Hyperlipidemia Mother   . Diabetes Paternal Grandmother   . Colon cancer Neg Hx   . Esophageal cancer Neg Hx   . Rectal cancer Neg Hx   . Stomach cancer Neg Hx     Past Surgical History:  Procedure Laterality Date  . COLONOSCOPY  2013  . cyst removed     behind left knee and left cheek  . ENDOMETRIAL ABLATION    . ESOPHAGOGASTRODUODENOSCOPY  2013   normal  . EYE SURGERY     bilarteral  . HYDRADENITIS EXCISION Right 09/13/2017   Procedure: EXCISION  HIDRADENITIS GROIN;  Surgeon: Clovis Riley, MD;  Location: [facility];  Service: General;  Laterality: Right;  LMA  . INCISION AND DRAINAGE ABSCESS / HEMATOMA OF BURSA / KNEE / THIGH     right upper thigh - 2008  . NORPLANT REMOVAL     right upper arm - 12/2001  . svd     x 2  . THYROID SURGERY  2004   removed  . TUBAL LIGATION     2003  . WISDOM TOOTH EXTRACTION     Social History   Occupational History  . Occupation: disabled  Tobacco Use  . Smoking status: Former Smoker    Packs/day: 0.50    Years: 25.00    Pack years: 12.50    Types: Cigarettes    Quit date: 06/30/2012    Years since quitting: 6.4  . Smokeless tobacco: Never Used  Substance and Sexual Activity  . Alcohol use: Not Currently    Comment: rare  . Drug use: Not Currently    Types: Marijuana    Comment: past hx 36yrs ago  . Sexual activity: Yes    Birth control/protection: Surgical
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
## 2018-11-29 DIAGNOSIS — S060XAA Concussion with loss of consciousness status unknown, initial encounter: Secondary | ICD-10-CM | POA: Insufficient documentation

## 2018-11-29 DIAGNOSIS — S060X9A Concussion with loss of consciousness of unspecified duration, initial encounter: Secondary | ICD-10-CM | POA: Insufficient documentation

## 2018-11-29 DIAGNOSIS — E669 Obesity, unspecified: Secondary | ICD-10-CM | POA: Diagnosis not present
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
## 2018-11-30 DIAGNOSIS — E669 Obesity, unspecified: Secondary | ICD-10-CM | POA: Diagnosis not present

## 2018-12-01 DIAGNOSIS — E669 Obesity, unspecified: Secondary | ICD-10-CM | POA: Diagnosis not present

## 2018-12-01 DIAGNOSIS — R600 Localized edema: Secondary | ICD-10-CM | POA: Insufficient documentation

## 2018-12-02 DIAGNOSIS — E669 Obesity, unspecified: Secondary | ICD-10-CM | POA: Diagnosis not present

## 2018-12-03 DIAGNOSIS — E669 Obesity, unspecified: Secondary | ICD-10-CM | POA: Diagnosis not present

## 2018-12-04 DIAGNOSIS — E669 Obesity, unspecified: Secondary | ICD-10-CM | POA: Diagnosis not present

## 2018-12-05 ENCOUNTER — Ambulatory Visit: Payer: Medicaid Other | Admitting: Student in an Organized Health Care Education/Training Program

## 2018-12-05 DIAGNOSIS — E669 Obesity, unspecified: Secondary | ICD-10-CM | POA: Diagnosis not present

## 2018-12-06 DIAGNOSIS — E669 Obesity, unspecified: Secondary | ICD-10-CM | POA: Diagnosis not present

## 2018-12-07 DIAGNOSIS — E669 Obesity, unspecified: Secondary | ICD-10-CM | POA: Diagnosis not present

## 2018-12-08 DIAGNOSIS — E669 Obesity, unspecified: Secondary | ICD-10-CM | POA: Diagnosis not present

## 2018-12-09 DIAGNOSIS — E669 Obesity, unspecified: Secondary | ICD-10-CM | POA: Diagnosis not present

## 2018-12-10 DIAGNOSIS — E669 Obesity, unspecified: Secondary | ICD-10-CM | POA: Diagnosis not present

## 2018-12-11 ENCOUNTER — Ambulatory Visit (INDEPENDENT_AMBULATORY_CARE_PROVIDER_SITE_OTHER): Payer: Medicaid Other | Admitting: Student in an Organized Health Care Education/Training Program

## 2018-12-11 ENCOUNTER — Telehealth: Payer: Self-pay | Admitting: Student in an Organized Health Care Education/Training Program

## 2018-12-11 ENCOUNTER — Encounter: Payer: Self-pay | Admitting: Student in an Organized Health Care Education/Training Program

## 2018-12-11 ENCOUNTER — Other Ambulatory Visit: Payer: Self-pay

## 2018-12-11 VITALS — BP 118/80 | HR 60 | Ht 68.0 in | Wt 258.4 lb

## 2018-12-11 DIAGNOSIS — S060X0D Concussion without loss of consciousness, subsequent encounter: Secondary | ICD-10-CM | POA: Diagnosis not present

## 2018-12-11 DIAGNOSIS — L301 Dyshidrosis [pompholyx]: Secondary | ICD-10-CM

## 2018-12-11 DIAGNOSIS — E669 Obesity, unspecified: Secondary | ICD-10-CM | POA: Diagnosis not present

## 2018-12-11 DIAGNOSIS — I890 Lymphedema, not elsewhere classified: Secondary | ICD-10-CM | POA: Diagnosis not present

## 2018-12-11 DIAGNOSIS — M5126 Other intervertebral disc displacement, lumbar region: Secondary | ICD-10-CM | POA: Diagnosis not present

## 2018-12-11 MED ORDER — TRAMADOL HCL 50 MG PO TABS: 50.0000 mg | ORAL_TABLET | Freq: Two times a day (BID) | ORAL | 0 refills | Status: DC | PRN

## 2018-12-11 MED ORDER — TRAMADOL HCL 50 MG PO TABS: 50.0000 mg | ORAL_TABLET | Freq: Two times a day (BID) | ORAL | 1 refills | Status: DC | PRN

## 2018-12-11 MED ORDER — ONDANSETRON HCL 4 MG PO TABS: 4.0000 mg | ORAL_TABLET | Freq: Three times a day (TID) | ORAL | 0 refills | Status: DC | PRN

## 2018-12-11 MED ORDER — CLOBETASOL PROPIONATE 0.05 % EX CREA: TOPICAL_CREAM | Freq: Two times a day (BID) | CUTANEOUS | 3 refills | Status: DC

## 2018-12-11 NOTE — Telephone Encounter (Signed)
Patient calling again to check the status of Oxycodone.

## 2018-12-11 NOTE — Assessment & Plan Note (Addendum)
Improved Patient has visual abnormalities which could be contributed to the concussion but also has loss of peripheral vision bilaterally which I recommend getting worked up by eye doctor. -Recommend continuing rest of mental activities -Provided patient with list of local eye doctors and requested she be evaluated as soon as possible
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
## 2018-12-11 NOTE — Telephone Encounter (Signed)
Patient is still at the drugstore and is asking about her medications.  They did not fill oxycodone, but are saying Tramadol was sent in and is not covered by her insurance.  Please call her asap as she is waiting, at [phone].

## 2018-12-11 NOTE — Patient Instructions (Signed)
It was a pleasure to see you today!  To summarize our discussion for this visit:  Today we discussed your concussion and it seems that you are improving.  However, I am concerned about your vision at this time I would like you to see an eye doctor.  I would also like you to follow-up with physical therapy and will be checking on that referral again.  I will refill your medications today and give you a prescription for compression stockings.  Please return to our clinic to see me in about 6 months or sooner if you need anything.  Call the clinic at [phone] if your symptoms worsen or you have any concerns.   Thank you for allowing me to take part in your care,  Dr. Doristine Mango

## 2018-12-12 ENCOUNTER — Other Ambulatory Visit: Payer: Self-pay | Admitting: Student in an Organized Health Care Education/Training Program

## 2018-12-12 ENCOUNTER — Telehealth: Payer: Self-pay | Admitting: *Deleted

## 2018-12-12 DIAGNOSIS — E669 Obesity, unspecified: Secondary | ICD-10-CM | POA: Diagnosis not present

## 2018-12-12 MED ORDER — OXYCODONE-ACETAMINOPHEN 10-325 MG PO TABS: 1.0000 | ORAL_TABLET | Freq: Two times a day (BID) | ORAL | 0 refills | Status: AC | PRN

## 2018-12-12 NOTE — Assessment & Plan Note (Addendum)
Chronic, unchanged. replaced patient's last prescription for compression stockings today

## 2018-12-12 NOTE — Telephone Encounter (Signed)
Completed PA info in [facility] for Percocet.  Status pending.  Will recheck status in 24 hours. Christen Bame, CMA

## 2018-12-12 NOTE — Progress Notes (Signed)
Called patient to discuss the pain medications (percocet). We had discussed weaning the medication or making a switch but patient's insurance will not cover tramadol. Patient was not compliant with recommendation for PT at last visit. Patient states that she is now able to attend physical therapy at the end of this month.  I have agreed to refill her medication again this month with the goal to taper and discontinue use. Patient is agreeble to this plan.

## 2018-12-12 NOTE — Assessment & Plan Note (Signed)
-  Encourage patient to follow-up with physical therapy -Attempted to prescribe tramadol which was not covered by patient's insurance -Discussed with patient tapering off oxycodone to which she was agreeable to.

## 2018-12-13 DIAGNOSIS — E669 Obesity, unspecified: Secondary | ICD-10-CM | POA: Diagnosis not present

## 2018-12-13 NOTE — Telephone Encounter (Signed)
Prior approval for oxycodone completed via [employer].  Med approved for 12/12/2018 - 06/10/2019. Pharmacy informed.  Christen Bame, CMA

## 2018-12-14 DIAGNOSIS — E669 Obesity, unspecified: Secondary | ICD-10-CM | POA: Diagnosis not present

## 2018-12-15 DIAGNOSIS — E669 Obesity, unspecified: Secondary | ICD-10-CM | POA: Diagnosis not present

## 2018-12-16 DIAGNOSIS — E669 Obesity, unspecified: Secondary | ICD-10-CM | POA: Diagnosis not present

## 2018-12-17 DIAGNOSIS — E669 Obesity, unspecified: Secondary | ICD-10-CM | POA: Diagnosis not present

## 2018-12-18 DIAGNOSIS — E669 Obesity, unspecified: Secondary | ICD-10-CM | POA: Diagnosis not present

## 2018-12-19 DIAGNOSIS — E669 Obesity, unspecified: Secondary | ICD-10-CM | POA: Diagnosis not present

## 2018-12-20 ENCOUNTER — Ambulatory Visit (INDEPENDENT_AMBULATORY_CARE_PROVIDER_SITE_OTHER): Payer: Medicaid Other | Admitting: Physical Medicine and Rehabilitation

## 2018-12-20 ENCOUNTER — Other Ambulatory Visit: Payer: Self-pay

## 2018-12-20 ENCOUNTER — Encounter: Payer: Self-pay | Admitting: Physical Medicine and Rehabilitation

## 2018-12-20 ENCOUNTER — Ambulatory Visit: Payer: Self-pay

## 2018-12-20 VITALS — BP 142/98 | HR 61

## 2018-12-20 DIAGNOSIS — M5116 Intervertebral disc disorders with radiculopathy, lumbar region: Secondary | ICD-10-CM

## 2018-12-20 DIAGNOSIS — M5416 Radiculopathy, lumbar region: Secondary | ICD-10-CM | POA: Diagnosis not present

## 2018-12-20 DIAGNOSIS — E669 Obesity, unspecified: Secondary | ICD-10-CM | POA: Diagnosis not present

## 2018-12-20 MED ORDER — METHYLPREDNISOLONE ACETATE 80 MG/ML IJ SUSP
80.0000 mg | Freq: Once | INTRAMUSCULAR | Status: AC
  Administered 2018-12-20: 80 mg

## 2018-12-20 NOTE — Progress Notes (Signed)
 .  Numeric Pain Rating Scale and Functional Assessment Average Pain 8   In the last MONTH (on 0-10 scale) has pain interfered with the following?  1. General activity like being  able to carry out your everyday physical activities such as walking, climbing stairs, carrying groceries, or moving a chair?  Rating(7)   +Driver, -BT, -Dye Allergies.  

## 2018-12-21 DIAGNOSIS — E669 Obesity, unspecified: Secondary | ICD-10-CM | POA: Diagnosis not present

## 2018-12-22 ENCOUNTER — Ambulatory Visit: Payer: Medicaid Other | Admitting: Student in an Organized Health Care Education/Training Program

## 2018-12-22 DIAGNOSIS — E669 Obesity, unspecified: Secondary | ICD-10-CM | POA: Diagnosis not present

## 2018-12-23 DIAGNOSIS — E669 Obesity, unspecified: Secondary | ICD-10-CM | POA: Diagnosis not present

## 2018-12-24 DIAGNOSIS — E669 Obesity, unspecified: Secondary | ICD-10-CM | POA: Diagnosis not present

## 2018-12-25 DIAGNOSIS — E669 Obesity, unspecified: Secondary | ICD-10-CM | POA: Diagnosis not present

## 2018-12-26 DIAGNOSIS — E669 Obesity, unspecified: Secondary | ICD-10-CM | POA: Diagnosis not present

## 2018-12-27 DIAGNOSIS — E669 Obesity, unspecified: Secondary | ICD-10-CM | POA: Diagnosis not present

## 2018-12-28 DIAGNOSIS — E669 Obesity, unspecified: Secondary | ICD-10-CM | POA: Diagnosis not present

## 2018-12-29 DIAGNOSIS — E669 Obesity, unspecified: Secondary | ICD-10-CM | POA: Diagnosis not present

## 2018-12-30 DIAGNOSIS — E669 Obesity, unspecified: Secondary | ICD-10-CM | POA: Diagnosis not present

## 2018-12-31 DIAGNOSIS — E669 Obesity, unspecified: Secondary | ICD-10-CM | POA: Diagnosis not present

## 2019-01-01 DIAGNOSIS — E669 Obesity, unspecified: Secondary | ICD-10-CM | POA: Diagnosis not present

## 2019-01-02 DIAGNOSIS — E669 Obesity, unspecified: Secondary | ICD-10-CM | POA: Diagnosis not present

## 2019-01-03 DIAGNOSIS — E669 Obesity, unspecified: Secondary | ICD-10-CM | POA: Diagnosis not present

## 2019-01-04 DIAGNOSIS — E669 Obesity, unspecified: Secondary | ICD-10-CM | POA: Diagnosis not present

## 2019-01-05 DIAGNOSIS — E669 Obesity, unspecified: Secondary | ICD-10-CM | POA: Diagnosis not present

## 2019-01-06 DIAGNOSIS — E669 Obesity, unspecified: Secondary | ICD-10-CM | POA: Diagnosis not present

## 2019-01-07 DIAGNOSIS — E669 Obesity, unspecified: Secondary | ICD-10-CM | POA: Diagnosis not present

## 2019-01-08 ENCOUNTER — Other Ambulatory Visit: Payer: Self-pay

## 2019-01-08 ENCOUNTER — Ambulatory Visit (INDEPENDENT_AMBULATORY_CARE_PROVIDER_SITE_OTHER): Payer: Medicaid Other | Admitting: Student in an Organized Health Care Education/Training Program

## 2019-01-08 VITALS — BP 118/80 | HR 83 | Wt 253.8 lb

## 2019-01-08 DIAGNOSIS — K529 Noninfective gastroenteritis and colitis, unspecified: Secondary | ICD-10-CM | POA: Diagnosis not present

## 2019-01-08 DIAGNOSIS — Z23 Encounter for immunization: Secondary | ICD-10-CM | POA: Diagnosis not present

## 2019-01-08 DIAGNOSIS — M47816 Spondylosis without myelopathy or radiculopathy, lumbar region: Secondary | ICD-10-CM | POA: Diagnosis not present

## 2019-01-08 DIAGNOSIS — R221 Localized swelling, mass and lump, neck: Secondary | ICD-10-CM | POA: Insufficient documentation

## 2019-01-08 DIAGNOSIS — R220 Localized swelling, mass and lump, head: Secondary | ICD-10-CM

## 2019-01-08 DIAGNOSIS — I889 Nonspecific lymphadenitis, unspecified: Secondary | ICD-10-CM | POA: Diagnosis not present

## 2019-01-08 DIAGNOSIS — E669 Obesity, unspecified: Secondary | ICD-10-CM | POA: Diagnosis not present

## 2019-01-08 MED ORDER — PANTOPRAZOLE SODIUM 40 MG PO TBEC: 40.0000 mg | DELAYED_RELEASE_TABLET | Freq: Two times a day (BID) | ORAL | 2 refills | Status: DC

## 2019-01-08 NOTE — Assessment & Plan Note (Addendum)
Attempt to locate female Ortho Psychologist, sport and exercise for patient.  Also follow-up on physical therapy as patient and I had discussed the plan to try physical therapy and decreasing dependent on narcotics.

## 2019-01-08 NOTE — Assessment & Plan Note (Signed)
Location and exam suggestive of cyst but cannot rule out chronic lymphadenitis.  Given possible B symptoms will send patient for neck CT with contrast.  We will discuss results at her next appointment.

## 2019-01-08 NOTE — Patient Instructions (Addendum)
It was a pleasure to see you today!  To summarize our discussion for this visit:  We are going to check your electrolytes and blood sugar today. I would recommend cutting back on the prune juice some  I will look into a new referral for orthopedics. I'm sorry to hear you've had a bad experience.   I will order a neck CT to evaluate the bump in your neck  I will refill your omeprazole.   I will put in a referral for gynecology.  Please return to our clinic to see me in about a month.  Call the clinic at [phone] if your symptoms worsen or you have any concerns.   Thank you for allowing me to take part in your care,  Dr. Doristine Mango

## 2019-01-08 NOTE — Assessment & Plan Note (Signed)
Ordering BMP and magnesium today to evaluate for need of supplements.  Recommended again that patient decrease her prune juice consumption but she is resistant to this change.

## 2019-01-08 NOTE — Progress Notes (Signed)
Subjective:    Patient ID: [name], female    DOB: [birth year], 53 y.o.   MRN: [id number]  CC: Checkup  HPI:  Diarrhea-patient continues to have approximately 3 watery bowel movements per day.  Denies abdominal cramping.  Has not decreased her prune juice intake as she feels like this is the cause for her continued weight loss.  Chronic pain-patient has received epidural injections and is not satisfied with her physician.  She would like a referral for a new orthopedic surgeon who is a female if possible.  She has not initiated physical therapy at this time and is not have any contact information to set up an appointment.  Neck bump-patient states that she has had a bump below her chin for approximately 8 months which seems to be getting bigger in that time..  It is very tender to palpation and feels similar to previous cyst.  She does endorse night sweats and wakes up drenched which is chronic for her.  Denies any difficulty with swallowing or breathing.  Gyn-would like referral to gynecology for vaginal dryness.  Smoking status reviewed   ROS: pertinent noted in the HPI   I have personally reviewed pertinent past medical history, surgical, family, and social history as appropriate.  Objective:  BP 118/80   Pulse 83   Wt 253 lb 12.8 oz (115.1 kg)   LMP 12/10/2018 (Exact Date)   SpO2 97%   BMI 38.59 kg/m   Vitals and nursing note reviewed  General: NAD, pleasant, able to participate in exam Neck: Approximately 1 cm diameter cervical mass which is hard and tender to palpation under left mandible.  Does move with swallowing.  Not visible to inspection Extremities: Symmetrical lower extremity edema present and unchanged Skin: warm and dry, no rashes noted Neuro: alert, no obvious focal deficits Psych: Normal affect and mood  Assessment & Plan:   Chronic diarrhea Ordering BMP and magnesium today to evaluate for need of supplements.  Recommended again that patient  decrease her prune juice consumption but she is resistant to this change.  Spondylosis of lumbar region without myelopathy or radiculopathy Attempt to locate female Ortho surgeon for patient.  Also follow-up on physical therapy as patient and I had discussed the plan to try physical therapy and decreasing dependent on narcotics.  Mass of left submandibular region Location and exam suggestive of cyst but cannot rule out chronic lymphadenitis.  Given possible B symptoms will send patient for neck CT with contrast.  We will discuss results at her next appointment.  Orders Placed This Encounter  Procedures  . CT Soft Tissue Neck W Contrast    Wt. 253 / no diabetic / no diabetic monitor / no hbp / no renal ca or sx / no dialysis / no kidney problems both kidneys /no transplant /no needs / not allergic to iv contrast  No Covid Test/ No Public Trans travel/ No Fever, Cough, Shortness of breath/ No Close Contact w/pos Covid pt/ No fever /PT AWARE OF MASK/ COME ALONE  Ins. mcd FH w [employer]     Standing Status:   Future    Standing Expiration Date:   04/09/2020    Order Specific Question:   ** REASON FOR EXAM (FREE TEXT)    Answer:   8 month tender and growing neck mass on left submandibular area    Order Specific Question:   If indicated for the ordered procedure, I authorize the administration of contrast media per Radiology protocol  Answer:   Yes    Order Specific Question:   Is patient pregnant?    Answer:   No    Order Specific Question:   Preferred imaging location?    Answer:   [facility]    Order Specific Question:   Radiology Contrast Protocol - do NOT remove file path    Answer:   \\[email or web address].pdf  . Flu Vaccine QUAD 36+ mos IM  . Comprehensive metabolic panel  . Magnesium    Doristine Mango, [facility] PGY-2

## 2019-01-09 DIAGNOSIS — E669 Obesity, unspecified: Secondary | ICD-10-CM | POA: Diagnosis not present

## 2019-01-09 LAB — COMPREHENSIVE METABOLIC PANEL
ALT: 27 IU/L (ref 0–32)
AST: 16 IU/L (ref 0–40)
Albumin/Globulin Ratio: 1.4 (ref 1.2–2.2)
Albumin: 3.8 g/dL (ref 3.8–4.9)
Alkaline Phosphatase: 99 IU/L (ref 39–117)
BUN/Creatinine Ratio: 13 (ref 9–23)
BUN: 10 mg/dL (ref 6–24)
Bilirubin Total: 0.2 mg/dL (ref 0.0–1.2)
CO2: 22 mmol/L (ref 20–29)
Calcium: 9.3 mg/dL (ref 8.7–10.2)
Chloride: 108 mmol/L — ABNORMAL HIGH (ref 96–106)
Creatinine, Ser: 0.79 mg/dL (ref 0.57–1.00)
GFR calc Af Amer: 99 mL/min/{1.73_m2} (ref >59)
GFR calc non Af Amer: 86 mL/min/{1.73_m2} (ref >59)
Globulin, Total: 2.7 g/dL (ref 1.5–4.5)
Glucose: 91 mg/dL (ref 65–99)
Potassium: 4.5 mmol/L (ref 3.5–5.2)
Sodium: 142 mmol/L (ref 134–144)
Total Protein: 6.5 g/dL (ref 6.0–8.5)

## 2019-01-09 LAB — MAGNESIUM: Magnesium: 2 mg/dL (ref 1.6–2.3)

## 2019-01-10 DIAGNOSIS — E669 Obesity, unspecified: Secondary | ICD-10-CM | POA: Diagnosis not present

## 2019-01-11 DIAGNOSIS — E669 Obesity, unspecified: Secondary | ICD-10-CM | POA: Diagnosis not present

## 2019-01-12 ENCOUNTER — Other Ambulatory Visit: Payer: Medicaid Other

## 2019-01-12 ENCOUNTER — Telehealth: Payer: Self-pay | Admitting: Student in an Organized Health Care Education/Training Program

## 2019-01-12 DIAGNOSIS — E669 Obesity, unspecified: Secondary | ICD-10-CM | POA: Diagnosis not present

## 2019-01-12 MED ORDER — OXYCODONE HCL 5 MG PO TABS: 5.0000 mg | ORAL_TABLET | ORAL | 0 refills | Status: DC | PRN

## 2019-01-12 NOTE — Telephone Encounter (Signed)
Patient calling for 2 things:  1. She would like a refill on her oxycodone sent to [facility] on [address].   2. She also wants to be contacted about her lab results from her appointment on 01/08/2019.

## 2019-01-12 NOTE — Telephone Encounter (Signed)
Called patient with neg results and informed her that her RX would be filled per Dr. Ouida Sills.  [name], [place]

## 2019-01-12 NOTE — Telephone Encounter (Signed)
Please let the patient know that her lab results were normal. I had sent her a lab result letter which may have not gotten to her yet.  I will refill her medication now.

## 2019-01-13 DIAGNOSIS — E669 Obesity, unspecified: Secondary | ICD-10-CM | POA: Diagnosis not present

## 2019-01-14 DIAGNOSIS — E669 Obesity, unspecified: Secondary | ICD-10-CM | POA: Diagnosis not present

## 2019-01-15 ENCOUNTER — Other Ambulatory Visit: Payer: Self-pay | Admitting: Student in an Organized Health Care Education/Training Program

## 2019-01-15 DIAGNOSIS — E669 Obesity, unspecified: Secondary | ICD-10-CM | POA: Diagnosis not present

## 2019-01-15 MED ORDER — OXYCODONE HCL 5 MG PO TABS: 5.0000 mg | ORAL_TABLET | ORAL | 0 refills | Status: DC | PRN

## 2019-01-15 NOTE — Telephone Encounter (Signed)
Patient's pharmacy still has not received this refill for her oxycodone. Looks like the refill says 'print' so the pharmacy never received it. Can we get this fixed please and let pt know once its been done.

## 2019-01-16 ENCOUNTER — Telehealth: Payer: Self-pay

## 2019-01-16 DIAGNOSIS — E669 Obesity, unspecified: Secondary | ICD-10-CM | POA: Diagnosis not present

## 2019-01-16 NOTE — Telephone Encounter (Signed)
Patient calls nurse line stating the recent oxycodone prescription can not be filled by the pharmacy. The prescription says, 'fill in January-February" Patient also stated she gets oxycodone-acetaminophen 10-325mg  #60. Please advise.

## 2019-01-17 ENCOUNTER — Other Ambulatory Visit: Payer: Self-pay | Admitting: Student in an Organized Health Care Education/Training Program

## 2019-01-17 ENCOUNTER — Other Ambulatory Visit: Payer: Medicaid Other

## 2019-01-17 DIAGNOSIS — E669 Obesity, unspecified: Secondary | ICD-10-CM | POA: Diagnosis not present

## 2019-01-17 MED ORDER — OXYCODONE HCL 5 MG PO TABS: 5.0000 mg | ORAL_TABLET | Freq: Two times a day (BID) | ORAL | 0 refills | Status: DC | PRN

## 2019-01-17 NOTE — Telephone Encounter (Signed)
Patient called office due to getting the wrong dose of her medication oxycodone she needs 10 mg instead of 5 mg. Please give pt a call back.

## 2019-01-18 ENCOUNTER — Telehealth: Payer: Self-pay | Admitting: Student in an Organized Health Care Education/Training Program

## 2019-01-18 DIAGNOSIS — E669 Obesity, unspecified: Secondary | ICD-10-CM | POA: Diagnosis not present

## 2019-01-18 NOTE — Telephone Encounter (Signed)
Pt is calling and would like to know if Dr. Ouida Sills can send in a new referral for physical therapy at an office in [place]. She said she was told she did not need a referral but when she called the number she was given they said it was only for Cancer patients.

## 2019-01-18 NOTE — Telephone Encounter (Signed)
Hi [name], can you please address the issue of physical therapy referral for [name]? I have been trying to get her seen for quite some time now and I think there is some confusion on the patient's part on where to go or who to contact. Thank you

## 2019-01-18 NOTE — Telephone Encounter (Signed)
Pt calls back.  She states that Dr. Ouida Sills called her yesterday and she is appreciative of that call but she was under the impression that the Medication was going to be 10 mg, not 5mg .  PCP paged. Christen Bame, CMA

## 2019-01-19 ENCOUNTER — Telehealth: Payer: Self-pay | Admitting: *Deleted

## 2019-01-19 DIAGNOSIS — E669 Obesity, unspecified: Secondary | ICD-10-CM | POA: Diagnosis not present

## 2019-01-19 NOTE — Telephone Encounter (Signed)
I called the patient again. She is fine with the prescription dose but what she is saying is... Medicaid will not pay for the prescription because it "looks suspicious" that I changed the prescription and she wants me to send it again at the higher dose so her insurance will cover it. I have never heard of this. Can someone provide some clarification please? I do not want to send a THIRD prescription as this seems to be more suspicious to me.

## 2019-01-19 NOTE — Telephone Encounter (Signed)
Ok I have called the Pharmacy and figured out the issue.   Pt was previously on oxycodone-acetaminophen 10-325, the new script is oxycodone 5mg .  This will require a new PA thru medicaid (never informed of this by pharmacy).  Since pt has now told provider that she is ok with the 5mg  dose, I will attempt the PA.  Pt informed that I am submitting the PA and will call her back around 2:30 or so to followup. Christen Bame, CMA

## 2019-01-19 NOTE — Telephone Encounter (Signed)
PA aprroved -01/19/2019 - 07/18/2019.  Pharmacy called and will fill within the hour.  Pt informed and very appreciative.   Christen Bame, CMA

## 2019-01-19 NOTE — Telephone Encounter (Signed)
I will call her again

## 2019-01-19 NOTE — Telephone Encounter (Signed)
PA approved.  See phone note.  Christen Bame, CMA

## 2019-01-19 NOTE — Telephone Encounter (Signed)
Completed PA info in [place] for Oxycodone 5mg .  Status pending.  Will recheck status in 24 hours. Christen Bame, CMA

## 2019-01-20 DIAGNOSIS — E669 Obesity, unspecified: Secondary | ICD-10-CM | POA: Diagnosis not present

## 2019-01-21 DIAGNOSIS — E669 Obesity, unspecified: Secondary | ICD-10-CM | POA: Diagnosis not present

## 2019-01-22 DIAGNOSIS — E669 Obesity, unspecified: Secondary | ICD-10-CM | POA: Diagnosis not present

## 2019-01-23 DIAGNOSIS — E669 Obesity, unspecified: Secondary | ICD-10-CM | POA: Diagnosis not present

## 2019-01-23 NOTE — Progress Notes (Signed)
[name] - 53 y.o. female MRN [id number]  Date of birth: 26-Aug-1965  Office Visit Note: Visit Date: 12/20/2018 PCP: Richarda Osmond, DO Referred by: Richarda Osmond, DO  Subjective: Chief Complaint  Patient presents with  . Lower Back - Pain  . Right Leg - Pain  . Left Leg - Pain   HPI:  [name] is a 53 y.o. female who comes in today At the request of Dr. Rodell Perna for epidural injection for low back pain and bilateral leg pain.  MRI has been completed and reviewed below and reviewed with the patient.  Course complicated by obesity and chronic pain syndrome using some chronic pain medication.  ROS Otherwise per HPI.  Assessment & Plan: Visit Diagnoses:  1. Lumbar radiculopathy   2. Radiculopathy due to lumbar intervertebral disc disorder     Plan: No additional findings.   Meds & Orders:  Meds ordered this encounter  Medications  . methylPREDNISolone acetate (DEPO-MEDROL) injection 80 mg    Orders Placed This Encounter  Procedures  . XR C-ARM NO REPORT  . Epidural Steroid injection    Follow-up: Return if symptoms worsen or fail to improve, for Rodell Perna, [name].   Procedures: No procedures performed  Lumbar Epidural Steroid Injection - Interlaminar Approach with Fluoroscopic Guidance  Patient: [name]      Date of Birth: 02-14-1966 MRN: [id number] PCP: Richarda Osmond, DO      Visit Date: 12/20/2018   Universal Protocol:     Consent Given By: the patient  Position: PRONE  Additional Comments: Vital signs were monitored before and after the procedure. Patient was prepped and draped in the usual sterile fashion. The correct patient, procedure, and site was verified.   Injection Procedure Details:  Procedure Site One Meds Administered:  Meds ordered this encounter  Medications  . methylPREDNISolone acetate (DEPO-MEDROL) injection 80 mg     Laterality: Right  Location/Site:  L5-S1  Needle size: 20 G  Needle type:  Tuohy  Needle Placement: Paramedian epidural  Findings:   -Comments: Excellent flow of contrast into the epidural space.  Procedure Details: Using a paramedian approach from the side mentioned above, the region overlying the inferior lamina was localized under fluoroscopic visualization and the soft tissues overlying this structure were infiltrated with 4 ml. of 1% Lidocaine without Epinephrine. The Tuohy needle was inserted into the epidural space using a paramedian approach.   The epidural space was localized using loss of resistance along with lateral and bi-planar fluoroscopic views.  After negative aspirate for air, blood, and CSF, a 2 ml. volume of Isovue-250 was injected into the epidural space and the flow of contrast was observed. Radiographs were obtained for documentation purposes.    The injectate was administered into the level noted above.   Additional Comments:  The patient tolerated the procedure well Dressing: 2 x 2 sterile gauze and Band-Aid    Post-procedure details: Patient was observed during the procedure. Post-procedure instructions were reviewed.  Patient left the clinic in stable condition.   Clinical History: MRI LUMBAR SPINE WITHOUT CONTRAST  TECHNIQUE: Multiplanar, multisequence MR imaging of the lumbar spine was performed. No intravenous contrast was administered.  COMPARISON:  Jul 26, 2014  FINDINGS: Segmentation: There are 5 non-rib bearing lumbar type vertebral bodies with the last intervertebral disc space labeled as L5-S1.  Alignment:  Normal  Vertebrae: The vertebral body heights are well maintained. No fracture, marrow edema,or pathologic marrow infiltration.  Conus medullaris  and cauda equina: Conus extends to the L1-2 level. Conus and cauda equina appear normal.  Paraspinal and other soft tissues: The paraspinal soft tissues and visualized retroperitoneal structures are unremarkable. The sacroiliac joints are intact.   Disc levels:  T12-L1:  No significant canal or neural foraminal narrowing.  L1-L2:   No significant canal or neural foraminal narrowing.  L2-L3: There is a minimal broad-based disc bulge with ligamentum flavum hypertrophy which causes mild bilateral neural foraminal narrowing.  L3-L4: There is a broad-based disc bulge with ligamentum flavum hypertrophy and facet arthrosis which causes moderate bilateral neural foraminal narrowing. There is mild effacement anterior thecal sac.  L4-L5: There is a broad-based disc bulge with ligamentum flavum hypertrophy and facet arthrosis. There is moderate bilateral neural foraminal narrowing. Narrowing. The central thecal sac appears to be mildly effaced measuring 8 mm in AP diameter.  L5-S1: There is a broad-based disc bulge with a right paracentral disc protrusion which contacts the descending right S1 nerve root. There is facet arthrosis and ligamentum flavum hypertrophy. Severe right and moderate to severe left neural foraminal narrowing is seen. The central thecal sac measures 7 mm in AP diameter.  IMPRESSION: Lumbar spine spondylosis most notable at L5-S1 with a right paracentral disc protrusion which contacts the descending right S1 nerve root. There is also severe right and moderate to severe left neural foraminal narrowing with mild central canal narrowing. This is slightly progressed since the prior exam.   Electronically Signed   By: Prudencio Pair [name]   On: 11/23/2018 13:55     Objective:  VS:  HT:    WT:   BMI:     BP:(!) 142/98  HR:61bpm  TEMP: ( )  RESP:  Physical Exam  Ortho Exam Imaging: No results found.

## 2019-01-23 NOTE — Procedures (Signed)
Lumbar Epidural Steroid Injection - Interlaminar Approach with Fluoroscopic Guidance  Patient: [name]      Date of Birth: 18-Nov-1965 MRN: [id number] PCP: Richarda Osmond, DO      Visit Date: 12/20/2018   Universal Protocol:     Consent Given By: the patient  Position: PRONE  Additional Comments: Vital signs were monitored before and after the procedure. Patient was prepped and draped in the usual sterile fashion. The correct patient, procedure, and site was verified.   Injection Procedure Details:  Procedure Site One Meds Administered:  Meds ordered this encounter  Medications  . methylPREDNISolone acetate (DEPO-MEDROL) injection 80 mg     Laterality: Right  Location/Site:  L5-S1  Needle size: 20 G  Needle type: Tuohy  Needle Placement: Paramedian epidural  Findings:   -Comments: Excellent flow of contrast into the epidural space.  Procedure Details: Using a paramedian approach from the side mentioned above, the region overlying the inferior lamina was localized under fluoroscopic visualization and the soft tissues overlying this structure were infiltrated with 4 ml. of 1% Lidocaine without Epinephrine. The Tuohy needle was inserted into the epidural space using a paramedian approach.   The epidural space was localized using loss of resistance along with lateral and bi-planar fluoroscopic views.  After negative aspirate for air, blood, and CSF, a 2 ml. volume of Isovue-250 was injected into the epidural space and the flow of contrast was observed. Radiographs were obtained for documentation purposes.    The injectate was administered into the level noted above.   Additional Comments:  The patient tolerated the procedure well Dressing: 2 x 2 sterile gauze and Band-Aid    Post-procedure details: Patient was observed during the procedure. Post-procedure instructions were reviewed.  Patient left the clinic in stable condition.

## 2019-01-24 DIAGNOSIS — E669 Obesity, unspecified: Secondary | ICD-10-CM | POA: Diagnosis not present

## 2019-01-25 ENCOUNTER — Other Ambulatory Visit: Payer: Self-pay

## 2019-01-25 ENCOUNTER — Ambulatory Visit
Admission: RE | Admit: 2019-01-25 | Discharge: 2019-01-25 | Disposition: A | Discharge status: Home / Self Care | Payer: Medicaid Other | Source: Ambulatory Visit | Attending: Family Medicine | Admitting: Family Medicine

## 2019-01-25 DIAGNOSIS — I889 Nonspecific lymphadenitis, unspecified: Secondary | ICD-10-CM | POA: Diagnosis not present

## 2019-01-25 DIAGNOSIS — E669 Obesity, unspecified: Secondary | ICD-10-CM | POA: Diagnosis not present

## 2019-01-25 MED ORDER — IOPAMIDOL (ISOVUE-300) INJECTION 61%
75.0000 mL | Freq: Once | INTRAVENOUS | Status: AC | PRN
  Administered 2019-01-25: 75 mL via INTRAVENOUS

## 2019-01-26 DIAGNOSIS — E669 Obesity, unspecified: Secondary | ICD-10-CM | POA: Diagnosis not present

## 2019-01-27 DIAGNOSIS — E669 Obesity, unspecified: Secondary | ICD-10-CM | POA: Diagnosis not present

## 2019-01-28 DIAGNOSIS — E669 Obesity, unspecified: Secondary | ICD-10-CM | POA: Diagnosis not present

## 2019-01-29 DIAGNOSIS — E669 Obesity, unspecified: Secondary | ICD-10-CM | POA: Diagnosis not present

## 2019-01-30 ENCOUNTER — Ambulatory Visit: Payer: Medicaid Other | Admitting: Orthopaedic Surgery

## 2019-01-30 DIAGNOSIS — E669 Obesity, unspecified: Secondary | ICD-10-CM | POA: Diagnosis not present

## 2019-01-31 DIAGNOSIS — E669 Obesity, unspecified: Secondary | ICD-10-CM | POA: Diagnosis not present

## 2019-02-02 DIAGNOSIS — E669 Obesity, unspecified: Secondary | ICD-10-CM | POA: Diagnosis not present

## 2019-02-03 DIAGNOSIS — E669 Obesity, unspecified: Secondary | ICD-10-CM | POA: Diagnosis not present

## 2019-02-04 DIAGNOSIS — E669 Obesity, unspecified: Secondary | ICD-10-CM | POA: Diagnosis not present

## 2019-02-05 DIAGNOSIS — E669 Obesity, unspecified: Secondary | ICD-10-CM | POA: Diagnosis not present

## 2019-02-06 DIAGNOSIS — E669 Obesity, unspecified: Secondary | ICD-10-CM | POA: Diagnosis not present

## 2019-02-07 ENCOUNTER — Ambulatory Visit: Payer: Medicaid Other | Admitting: Student in an Organized Health Care Education/Training Program

## 2019-02-07 DIAGNOSIS — E669 Obesity, unspecified: Secondary | ICD-10-CM | POA: Diagnosis not present

## 2019-02-08 DIAGNOSIS — E669 Obesity, unspecified: Secondary | ICD-10-CM | POA: Diagnosis not present

## 2019-02-09 DIAGNOSIS — E669 Obesity, unspecified: Secondary | ICD-10-CM | POA: Diagnosis not present

## 2019-02-10 DIAGNOSIS — E669 Obesity, unspecified: Secondary | ICD-10-CM | POA: Diagnosis not present

## 2019-02-11 DIAGNOSIS — E669 Obesity, unspecified: Secondary | ICD-10-CM | POA: Diagnosis not present

## 2019-02-12 DIAGNOSIS — E669 Obesity, unspecified: Secondary | ICD-10-CM | POA: Diagnosis not present

## 2019-02-13 DIAGNOSIS — E669 Obesity, unspecified: Secondary | ICD-10-CM | POA: Diagnosis not present

## 2019-02-14 DIAGNOSIS — E669 Obesity, unspecified: Secondary | ICD-10-CM | POA: Diagnosis not present

## 2019-02-15 DIAGNOSIS — E669 Obesity, unspecified: Secondary | ICD-10-CM | POA: Diagnosis not present

## 2019-02-16 ENCOUNTER — Encounter: Payer: Self-pay | Admitting: Student in an Organized Health Care Education/Training Program

## 2019-02-16 ENCOUNTER — Other Ambulatory Visit: Payer: Self-pay

## 2019-02-16 ENCOUNTER — Ambulatory Visit (INDEPENDENT_AMBULATORY_CARE_PROVIDER_SITE_OTHER): Payer: Medicaid Other | Admitting: Student in an Organized Health Care Education/Training Program

## 2019-02-16 DIAGNOSIS — E669 Obesity, unspecified: Secondary | ICD-10-CM | POA: Diagnosis not present

## 2019-02-16 DIAGNOSIS — M47816 Spondylosis without myelopathy or radiculopathy, lumbar region: Secondary | ICD-10-CM

## 2019-02-16 DIAGNOSIS — N898 Other specified noninflammatory disorders of vagina: Secondary | ICD-10-CM

## 2019-02-16 DIAGNOSIS — R220 Localized swelling, mass and lump, head: Secondary | ICD-10-CM | POA: Diagnosis not present

## 2019-02-16 MED ORDER — LYRICA 150 MG PO CAPS: 150.0000 mg | ORAL_CAPSULE | Freq: Two times a day (BID) | ORAL | 1 refills | Status: DC

## 2019-02-16 MED ORDER — OXYCODONE HCL 5 MG PO TABS: 5.0000 mg | ORAL_TABLET | Freq: Two times a day (BID) | ORAL | 0 refills | Status: DC | PRN

## 2019-02-16 NOTE — Assessment & Plan Note (Signed)
Continue pain medication regimen and continue to attempt to connect patient with physical therapy.  She still desires referral to female ortho surgeon. Will continue to attempt to reach referral coordinator on this subject.

## 2019-02-16 NOTE — Assessment & Plan Note (Signed)
Recommended to follow up with gynecologist

## 2019-02-16 NOTE — Patient Instructions (Signed)
It was a pleasure to see you today!  To summarize our discussion for this visit:  I am going to follow up on your referrals for your ortho surgeon and physical therapy.  PLEASE LET ME KNOW IF THESE DO NOT GO THROUGH.  Please reach out to your gynecologist to discuss vaginal dryness.  I will refill your pain meds today.   Your CT showed no concerning masses  Please return to our clinic to see me in about 2 months.  Call the clinic at [phone] if your symptoms worsen or you have any concerns.   Thank you for allowing me to take part in your care,  Dr. Doristine Mango

## 2019-02-16 NOTE — Assessment & Plan Note (Addendum)
Head/neck CT negative for mass (motion degraded) History and exam unchanged. Not worrisome to patient so will continue to watch

## 2019-02-16 NOTE — Progress Notes (Signed)
   Subjective:    Patient ID: [name], female    DOB: 14-Dec-1965, 53 y.o.   MRN: [id number]  CC: f/u  HPI:  Chronic pain-patient continues to have chronic low back pain and lower extremity swelling. She has been tolerating the new, lower dose of oxycodone and thanked me for lowering it. Has not had follow up with ortho surgery or physical therapy.  Vaginal dryness- patient has not followed up with gynecologist yet. Does not report new symptoms.  Neck lump- head/neck CT negative. Lump still present but has not grown and is not concerning patient. No impingement of breathing/eating/swallowing. Similar to previous cysts  Smoking- smokes approximately 2 cigarettes per day only 3-4 days per month around the 16th of every month as the anniversary of her mothers death 4 years ago causes her anxiety. She does not desire to stop as she doesn't want to take other medications for her anxiety.   Smoking status reviewed   ROS: pertinent noted in the HPI   I have personally reviewed pertinent past medical history, surgical, family, and social history as appropriate. Objective:  BP 122/65   Pulse 67   Wt 261 lb (118.4 kg)   SpO2 99%   BMI 39.68 kg/m   Vitals and nursing note reviewed  General: NAD, pleasant, able to participate in exam HEENT: normocephalic, atraumatic. To very light palpation of hair/scalp- patient endorses tingling sensation to scalp which does not radiate, hurt, or cause visual disturbances.  L submandibular mass unchanged from last exam and moves with swallowing- non-tender.  Extremities: Chronic lower extremity edema, no cyanosis or lesions. Skin: warm and dry, no rashes noted Neuro: alert, no obvious focal deficits Psych: Normal affect and mood  Assessment & Plan:   Mass of left submandibular region Head/neck CT negative for mass (motion degraded) History and exam unchanged. Not worrisome to patient so will continue to watch  Vaginal dryness Recommended to  follow up with gynecologist  Spondylosis of lumbar region without myelopathy or radiculopathy Continue pain medication regimen and continue to attempt to connect patient with physical therapy.  She still desires referral to female ortho surgeon. Will continue to attempt to reach referral coordinator on this subject.   Meds ordered this encounter  Medications  . LYRICA 150 MG capsule    Sig: Take 1 capsule (150 mg total) by mouth 2 (two) times daily.    Dispense:  180 capsule    Refill:  1  . oxyCODONE (OXY IR/ROXICODONE) 5 MG immediate release tablet    Sig: Take 1 tablet (5 mg total) by mouth every 12 (twelve) hours as needed.    Dispense:  60 tablet    Refill:  0    I independently examined pertinent imaging in relation to problem.  Doristine Mango, [facility] PGY-2

## 2019-02-17 DIAGNOSIS — E669 Obesity, unspecified: Secondary | ICD-10-CM | POA: Diagnosis not present

## 2019-02-18 DIAGNOSIS — E669 Obesity, unspecified: Secondary | ICD-10-CM | POA: Diagnosis not present

## 2019-02-19 DIAGNOSIS — E669 Obesity, unspecified: Secondary | ICD-10-CM | POA: Diagnosis not present

## 2019-02-20 DIAGNOSIS — E669 Obesity, unspecified: Secondary | ICD-10-CM | POA: Diagnosis not present

## 2019-02-21 DIAGNOSIS — E669 Obesity, unspecified: Secondary | ICD-10-CM | POA: Diagnosis not present

## 2019-02-22 ENCOUNTER — Other Ambulatory Visit: Payer: Self-pay | Admitting: Student in an Organized Health Care Education/Training Program

## 2019-02-22 DIAGNOSIS — F329 Major depressive disorder, single episode, unspecified: Secondary | ICD-10-CM

## 2019-02-22 DIAGNOSIS — E669 Obesity, unspecified: Secondary | ICD-10-CM | POA: Diagnosis not present

## 2019-02-22 DIAGNOSIS — F32A Depression, unspecified: Secondary | ICD-10-CM

## 2019-02-23 ENCOUNTER — Telehealth: Payer: Self-pay | Admitting: *Deleted

## 2019-02-23 DIAGNOSIS — E669 Obesity, unspecified: Secondary | ICD-10-CM | POA: Diagnosis not present

## 2019-02-23 NOTE — Telephone Encounter (Signed)
Lyrica was sent in as name brand.    Name brand lyrica will need a PA, will forward to PCP to see if lyrica can be changed to generic.  Christen Bame, CMA

## 2019-02-24 DIAGNOSIS — E669 Obesity, unspecified: Secondary | ICD-10-CM | POA: Diagnosis not present

## 2019-02-25 DIAGNOSIS — E669 Obesity, unspecified: Secondary | ICD-10-CM | POA: Diagnosis not present

## 2019-02-26 DIAGNOSIS — E669 Obesity, unspecified: Secondary | ICD-10-CM | POA: Diagnosis not present

## 2019-02-26 NOTE — Telephone Encounter (Signed)
LM on Pharmacy VM that it was ok to change meds to generic. Christen Bame, CMA

## 2019-02-26 NOTE — Telephone Encounter (Signed)
Hello, a generic should be just fine in this case.

## 2019-02-27 DIAGNOSIS — E669 Obesity, unspecified: Secondary | ICD-10-CM | POA: Diagnosis not present

## 2019-02-28 DIAGNOSIS — E669 Obesity, unspecified: Secondary | ICD-10-CM | POA: Diagnosis not present

## 2019-03-01 DIAGNOSIS — E669 Obesity, unspecified: Secondary | ICD-10-CM | POA: Diagnosis not present

## 2019-03-03 DIAGNOSIS — E669 Obesity, unspecified: Secondary | ICD-10-CM | POA: Diagnosis not present

## 2019-03-04 DIAGNOSIS — E669 Obesity, unspecified: Secondary | ICD-10-CM | POA: Diagnosis not present

## 2019-03-05 DIAGNOSIS — E669 Obesity, unspecified: Secondary | ICD-10-CM | POA: Diagnosis not present

## 2019-03-06 DIAGNOSIS — E669 Obesity, unspecified: Secondary | ICD-10-CM | POA: Diagnosis not present

## 2019-03-07 DIAGNOSIS — E669 Obesity, unspecified: Secondary | ICD-10-CM | POA: Diagnosis not present

## 2019-03-08 DIAGNOSIS — E669 Obesity, unspecified: Secondary | ICD-10-CM | POA: Diagnosis not present

## 2019-03-09 DIAGNOSIS — E669 Obesity, unspecified: Secondary | ICD-10-CM | POA: Diagnosis not present

## 2019-03-10 DIAGNOSIS — E669 Obesity, unspecified: Secondary | ICD-10-CM | POA: Diagnosis not present

## 2019-03-11 DIAGNOSIS — E669 Obesity, unspecified: Secondary | ICD-10-CM | POA: Diagnosis not present

## 2019-03-12 DIAGNOSIS — E669 Obesity, unspecified: Secondary | ICD-10-CM | POA: Diagnosis not present

## 2019-03-13 DIAGNOSIS — E669 Obesity, unspecified: Secondary | ICD-10-CM | POA: Diagnosis not present

## 2019-03-14 ENCOUNTER — Telehealth: Payer: Self-pay | Admitting: Student in an Organized Health Care Education/Training Program

## 2019-03-14 ENCOUNTER — Other Ambulatory Visit: Payer: Self-pay | Admitting: Student in an Organized Health Care Education/Training Program

## 2019-03-14 DIAGNOSIS — E669 Obesity, unspecified: Secondary | ICD-10-CM | POA: Diagnosis not present

## 2019-03-14 DIAGNOSIS — I890 Lymphedema, not elsewhere classified: Secondary | ICD-10-CM

## 2019-03-14 NOTE — Telephone Encounter (Signed)
Patient calling concerning her physical therapy referral for her legs. Patient states she spoke to [facility]patient [facility] and they told her they could not take her since she 'didn't have cancer.' Patient states she spoke to Dr. Ouida Sills in December and was told somebody would be calling her to set her up with another office for physical therapy. Patient would like to know what is going on with this referral. Please advise.

## 2019-03-14 NOTE — Telephone Encounter (Signed)
I have sent in referral for patient for physical therapy but she states that she has never heard anything. I just put in another physical therapy referral. Please, can you follow up with this referral and let me know when the connection is set so I know she is being taken care of? Thank you

## 2019-03-15 DIAGNOSIS — E669 Obesity, unspecified: Secondary | ICD-10-CM | POA: Diagnosis not present

## 2019-03-16 DIAGNOSIS — E669 Obesity, unspecified: Secondary | ICD-10-CM | POA: Diagnosis not present

## 2019-03-17 DIAGNOSIS — E669 Obesity, unspecified: Secondary | ICD-10-CM | POA: Diagnosis not present

## 2019-03-18 DIAGNOSIS — E669 Obesity, unspecified: Secondary | ICD-10-CM | POA: Diagnosis not present

## 2019-03-19 DIAGNOSIS — E669 Obesity, unspecified: Secondary | ICD-10-CM | POA: Diagnosis not present

## 2019-03-20 DIAGNOSIS — E669 Obesity, unspecified: Secondary | ICD-10-CM | POA: Diagnosis not present

## 2019-03-21 ENCOUNTER — Other Ambulatory Visit: Payer: Self-pay

## 2019-03-21 ENCOUNTER — Ambulatory Visit (INDEPENDENT_AMBULATORY_CARE_PROVIDER_SITE_OTHER): Payer: Medicaid Other | Admitting: Student in an Organized Health Care Education/Training Program

## 2019-03-21 ENCOUNTER — Encounter: Payer: Self-pay | Admitting: Student in an Organized Health Care Education/Training Program

## 2019-03-21 DIAGNOSIS — M47816 Spondylosis without myelopathy or radiculopathy, lumbar region: Secondary | ICD-10-CM

## 2019-03-21 DIAGNOSIS — Z780 Asymptomatic menopausal state: Secondary | ICD-10-CM | POA: Diagnosis not present

## 2019-03-21 DIAGNOSIS — E038 Other specified hypothyroidism: Secondary | ICD-10-CM | POA: Diagnosis not present

## 2019-03-21 DIAGNOSIS — N898 Other specified noninflammatory disorders of vagina: Secondary | ICD-10-CM

## 2019-03-21 DIAGNOSIS — L301 Dyshidrosis [pompholyx]: Secondary | ICD-10-CM | POA: Diagnosis not present

## 2019-03-21 DIAGNOSIS — E890 Postprocedural hypothyroidism: Secondary | ICD-10-CM

## 2019-03-21 DIAGNOSIS — E669 Obesity, unspecified: Secondary | ICD-10-CM | POA: Diagnosis not present

## 2019-03-21 MED ORDER — PREMARIN 0.625 MG/GM VA CREA: TOPICAL_CREAM | VAGINAL | 0 refills | Status: DC

## 2019-03-21 MED ORDER — ALBUTEROL SULFATE HFA 108 (90 BASE) MCG/ACT IN AERS: INHALATION_SPRAY | RESPIRATORY_TRACT | 1 refills | Status: DC

## 2019-03-21 MED ORDER — LYRICA 150 MG PO CAPS: 150.0000 mg | ORAL_CAPSULE | Freq: Two times a day (BID) | ORAL | 1 refills | Status: DC

## 2019-03-21 MED ORDER — BACITRACIN-POLYMYXIN B 500-10000 UNIT/GM EX OINT: TOPICAL_OINTMENT | CUTANEOUS | 5 refills | Status: DC

## 2019-03-21 MED ORDER — OXYCODONE HCL 5 MG PO TABS: 5.0000 mg | ORAL_TABLET | Freq: Two times a day (BID) | ORAL | 0 refills | Status: DC | PRN

## 2019-03-21 MED ORDER — LEVOTHYROXINE SODIUM 200 MCG PO TABS: 200.0000 ug | ORAL_TABLET | Freq: Every day | ORAL | 2 refills | Status: DC

## 2019-03-21 MED ORDER — SPIRIVA HANDIHALER 18 MCG IN CAPS: 18.0000 ug | ORAL_CAPSULE | Freq: Every day | RESPIRATORY_TRACT | 11 refills | Status: DC | PRN

## 2019-03-21 MED ORDER — CLOBETASOL PROPIONATE 0.05 % EX CREA: TOPICAL_CREAM | Freq: Two times a day (BID) | CUTANEOUS | 3 refills | Status: DC

## 2019-03-21 NOTE — Assessment & Plan Note (Signed)
Refilled levothyroxine Recheck thyroid hormones 6/21

## 2019-03-21 NOTE — Progress Notes (Addendum)
Subjective:    Patient ID: [name], female    DOB: 1966-02-11, 54 y.o.   MRN: [id number]  CC: medication refill  HPI:  Patient states that she is doing well overall and has no new complaints today.  Hypothyroidism-patient needs refill of her medication.  Denies symptoms of hyper or hypo-.  Lower extremity swelling with back pain-patient has been in contact with our office to follow-up with physical therapy however has not been successful in finding a location that she can go to.  She believes that she spoke with Amy about this problem and will follow up with her.  Menopause-patient continues to have vaginal dryness, irritability and menstrual spotting irregularly.  Unchanged symptoms and well controlled with vaginal suppository.  Needs refill of Lyrica and breathing treatments. PHQ-9 score of 12, but patient clarifies scores are in relation to her lymphadema and not mood. Score of 0 for question 9.   We made a plan to discuss only menopause at her next visit.   Smoking status reviewed   ROS: pertinent noted in the HPI   I have personally reviewed pertinent past medical history, surgical, family, and social history as appropriate. Objective:  BP 110/78   Pulse 77   Wt 257 lb 3.2 oz (116.7 kg)   LMP 03/19/2019   SpO2 99%   BMI 39.11 kg/m   Vitals and nursing note reviewed  General: NAD, pleasant, able to participate in exam Cardiac: RRR, S1 S2 present. normal heart sounds, no murmurs. Respiratory: CTAB, normal effort, No wheezes, rales or rhonchi Extremities: Nonpitting bilateral lower extremity edema, no cyanosis. Skin: warm and dry, no rashes noted Neuro: alert, no obvious focal deficits Psych: Normal affect and mood  Assessment & Plan:   Hypothyroidism Refilled levothyroxine Recheck thyroid hormones 6/21  Spondylosis of lumbar region without myelopathy or radiculopathy Continue to follow-up with physical therapy referral Checked PDMP and refilled pain  medication.  We will continue to keep chronic pain meds at this level until assessed and treated with physical therapy.  Vaginal dryness Treatment has been working well.  Refill prescription.   Menopause Provided patient with information packet on menopause We will schedule an appointment in a month to discuss menopause as well as optional hormone replacement therapy..   Meds ordered this encounter  Medications  . albuterol (VENTOLIN HFA) 108 (90 Base) MCG/ACT inhaler    Sig: INHALE 2 PUFFS INTO THE LUNGS EVERY 4 HOURS AS NEEDED FOR SHORTNESS OF BREATH    Dispense:  8.5 g    Refill:  1  . SPIRIVA HANDIHALER 18 MCG inhalation capsule    Sig: Place 1 capsule (18 mcg total) into inhaler and inhale daily as needed.    Dispense:  30 capsule    Refill:  11  . conjugated estrogens (PREMARIN) vaginal cream    Sig: INSERT 1 APPLICATORFUL VAGINALLY DAILY FOR 21 DAYS    Dispense:  30 g    Refill:  0  . levothyroxine (SYNTHROID) 200 MCG tablet    Sig: Take 1 tablet (200 mcg total) by mouth daily.    Dispense:  90 tablet    Refill:  2  . clobetasol cream (TEMOVATE) 0.05 %    Sig: Apply topically 2 (two) times daily.    Dispense:  120 g    Refill:  3  . bacitracin-polymyxin b (POLYSPORIN) ointment    Sig: Apply to affected area 2 to 4 times a day for 2 months.    Dispense:  30  g    Refill:  5  . LYRICA 150 MG capsule    Sig: Take 1 capsule (150 mg total) by mouth 2 (two) times daily.    Dispense:  180 capsule    Refill:  1  . oxyCODONE (OXY IR/ROXICODONE) 5 MG immediate release tablet    Sig: Take 1 tablet (5 mg total) by mouth every 12 (twelve) hours as needed.    Dispense:  60 tablet    Refill:  0    Doristine Mango, [facility] PGY-2

## 2019-03-21 NOTE — Patient Instructions (Addendum)
It was a pleasure to see you today!  To summarize our discussion for this visit:  I will continue to follow up on your physical therapy referral  I will refill your medications  We will recheck your blood sugars and thyroid hormones in June  Please return to our clinic to see me in about 6 months.  Call the clinic at [phone] if your symptoms worsen or you have any concerns.   Thank you for allowing me to take part in your care,  Dr. Doristine Mango   Steps to Quit Smoking Smoking tobacco is the leading cause of preventable death. It can affect almost every organ in the body. Smoking puts you and people around you at risk for many serious, long-lasting (chronic) diseases. Quitting smoking can be hard, but it is one of the best things that you can do for your health. It is never too late to quit. How do I get ready to quit? When you decide to quit smoking, make a plan to help you succeed. Before you quit:  Pick a date to quit. Set a date within the next 2 weeks to give you time to prepare.  Write down the reasons why you are quitting. Keep this list in places where you will see it often.  Tell your family, friends, and co-workers that you are quitting. Their support is important.  Talk with your doctor about the choices that may help you quit.  Find out if your health insurance will pay for these treatments.  Know the people, places, things, and activities that make you want to smoke (triggers). Avoid them. What first steps can I take to quit smoking?  Throw away all cigarettes at home, at work, and in your car.  Throw away the things that you use when you smoke, such as ashtrays and lighters.  Clean your car. Make sure to empty the ashtray.  Clean your home, including curtains and carpets. What can I do to help me quit smoking? Talk with your doctor about taking medicines and seeing a counselor at the same time. You are more likely to succeed when you do both.  If  you are pregnant or breastfeeding, talk with your doctor about counseling or other ways to quit smoking. Do not take medicine to help you quit smoking unless your doctor tells you to do so. To quit smoking: Quit right away  Quit smoking totally, instead of slowly cutting back on how much you smoke over a period of time.  Go to counseling. You are more likely to quit if you go to counseling sessions regularly. Take medicine You may take medicines to help you quit. Some medicines need a prescription, and some you can buy over-the-counter. Some medicines may contain a drug called nicotine to replace the nicotine in cigarettes. Medicines may:  Help you to stop having the desire to smoke (cravings).  Help to stop the problems that come when you stop smoking (withdrawal symptoms). Your doctor may ask you to use:  Nicotine patches, gum, or lozenges.  Nicotine inhalers or sprays.  Non-nicotine medicine that is taken by mouth. Find resources Find resources and other ways to help you quit smoking and remain smoke-free after you quit. These resources are most helpful when you use them often. They include:  Online chats with a Social worker.  Phone quitlines.  Printed Furniture conservator/restorer.  Support groups or group counseling.  Text messaging programs.  Mobile phone apps. Use apps on your mobile phone or tablet that  can help you stick to your quit plan. There are many free apps for mobile phones and tablets as well as websites. Examples include Quit Guide from the [employer] and smokefree.gov  What things can I do to make it easier to quit?   Talk to your family and friends. Ask them to support and encourage you.  Call a phone quitline (1-800-QUIT-NOW), reach out to support groups, or work with a Social worker.  Ask people who smoke to not smoke around you.  Avoid places that make you want to smoke, such as: ? Bars. ? Parties. ? Smoke-break areas at work.  Spend time with people who do not  smoke.  Lower the stress in your life. Stress can make you want to smoke. Try these things to help your stress: ? Getting regular exercise. ? Doing deep-breathing exercises. ? Doing yoga. ? Meditating. ? Doing a body scan. To do this, close your eyes, focus on one area of your body at a time from head to toe. Notice which parts of your body are tense. Try to relax the muscles in those areas. How will I feel when I quit smoking? Day 1 to 3 weeks Within the first 24 hours, you may start to have some problems that come from quitting tobacco. These problems are very bad 2-3 days after you quit, but they do not often last for more than 2-3 weeks. You may get these symptoms:  Mood swings.  Feeling restless, nervous, angry, or annoyed.  Trouble concentrating.  Dizziness.  Strong desire for high-sugar foods and nicotine.  Weight gain.  Trouble pooping (constipation).  Feeling like you may vomit (nausea).  Coughing or a sore throat.  Changes in how the medicines that you take for other issues work in your body.  Depression.  Trouble sleeping (insomnia). Week 3 and afterward After the first 2-3 weeks of quitting, you may start to notice more positive results, such as:  Better sense of smell and taste.  Less coughing and sore throat.  Slower heart rate.  Lower blood pressure.  Clearer skin.  Better breathing.  Fewer sick days. Quitting smoking can be hard. Do not give up if you fail the first time. Some people need to try a few times before they succeed. Do your best to stick to your quit plan, and talk with your doctor if you have any questions or concerns. Summary  Smoking tobacco is the leading cause of preventable death. Quitting smoking can be hard, but it is one of the best things that you can do for your health.  When you decide to quit smoking, make a plan to help you succeed.  Quit smoking right away, not slowly over a period of time.  When you start  quitting, seek help from your doctor, family, or friends. This information is not intended to replace advice given to you by your health care provider. Make sure you discuss any questions you have with your health care provider. Document Revised: 11/17/2018 Document Reviewed: 05/13/2018 Elsevier Patient Education  2020 [place].   Menopause Menopause is the normal time of life when menstrual periods stop completely. It is usually confirmed by 12 months without a menstrual period. The transition to menopause (perimenopause) most often happens between the ages of 86 and 78. During perimenopause, hormone levels change in your body, which can cause symptoms and affect your health. Menopause may increase your risk for:  Loss of bone (osteoporosis), which causes bone breaks (fractures).  Depression.  Hardening and  narrowing of the arteries (atherosclerosis), which can cause heart attacks and strokes. What are the causes? This condition is usually caused by a natural change in hormone levels that happens as you get older. The condition may also be caused by surgery to remove both ovaries (bilateral oophorectomy). What increases the risk? This condition is more likely to start at an earlier age if you have certain medical conditions or treatments, including:  A tumor of the pituitary gland in the brain.  A disease that affects the ovaries and hormone production.  Radiation treatment for cancer.  Certain cancer treatments, such as chemotherapy or hormone (anti-estrogen) therapy.  Heavy smoking and excessive alcohol use.  Family history of early menopause. This condition is also more likely to develop earlier in women who are very thin. What are the signs or symptoms? Symptoms of this condition include:  Hot flashes.  Irregular menstrual periods.  Night sweats.  Changes in feelings about sex. This could be a decrease in sex drive or an increased comfort around your  sexuality.  Vaginal dryness and thinning of the vaginal walls. This may cause painful intercourse.  Dryness of the skin and development of wrinkles.  Headaches.  Problems sleeping (insomnia).  Mood swings or irritability.  Memory problems.  Weight gain.  Hair growth on the face and chest.  Bladder infections or problems with urinating. How is this diagnosed? This condition is diagnosed based on your medical history, a physical exam, your age, your menstrual history, and your symptoms. Hormone tests may also be done. How is this treated? In some cases, no treatment is needed. You and your health care provider should make a decision together about whether treatment is necessary. Treatment will be based on your individual condition and preferences. Treatment for this condition focuses on managing symptoms. Treatment may include:  Menopausal hormone therapy (MHT).  Medicines to treat specific symptoms or complications.  Acupuncture.  Vitamin or herbal supplements. Before starting treatment, make sure to let your health care provider know if you have a personal or family history of:  Heart disease.  Breast cancer.  Blood clots.  Diabetes.  Osteoporosis. Follow these instructions at home: Lifestyle  Do not use any products that contain nicotine or tobacco, such as cigarettes and e-cigarettes. If you need help quitting, ask your health care provider.  Get at least 30 minutes of physical activity on 5 or more days each week.  Avoid alcoholic and caffeinated beverages, as well as spicy foods. This may help prevent hot flashes.  Get 7-8 hours of sleep each night.  If you have hot flashes, try: ? Dressing in layers. ? Avoiding things that may trigger hot flashes, such as spicy food, warm places, or stress. ? Taking slow, deep breaths when a hot flash starts. ? Keeping a fan in your home and office.  Find ways to manage stress, such as deep breathing, meditation, or  journaling.  Consider going to group therapy with other women who are having menopause symptoms. Ask your health care provider about recommended group therapy meetings. Eating and drinking  Eat a healthy, balanced diet that contains whole grains, lean protein, low-fat dairy, and plenty of fruits and vegetables.  Your health care provider may recommend adding more soy to your diet. Foods that contain soy include tofu, tempeh, and soy milk.  Eat plenty of foods that contain calcium and vitamin D for bone health. Items that are rich in calcium include low-fat milk, yogurt, beans, almonds, sardines, broccoli, and kale. Medicines  Take over-the-counter and prescription medicines only as told by your health care provider.  Talk with your health care provider before starting any herbal supplements. If prescribed, take vitamins and supplements as told by your health care provider. These may include: ? Calcium. Women age 14 and older should get 1,200 mg (milligrams) of calcium every day. ? Vitamin D. Women need 600-800 International Units of vitamin D each day. ? Vitamins B12 and B6. Aim for 50 micrograms of B12 and 1.5 mg of B6 each day. General instructions  Keep track of your menstrual periods, including: ? When they occur. ? How heavy they are and how long they last. ? How much time passes between periods.  Keep track of your symptoms, noting when they start, how often you have them, and how long they last.  Use vaginal lubricants or moisturizers to help with vaginal dryness and improve comfort during sex.  Keep all follow-up visits as told by your health care provider. This is important. This includes any group therapy or counseling. Contact a health care provider if:  You are still having menstrual periods after age 15.  You have pain during sex.  You have not had a period for 12 months and you develop vaginal bleeding. Get help right away if:  You have: ? Severe  depression. ? Excessive vaginal bleeding. ? Pain when you urinate. ? A fast or irregular heart beat (palpitations). ? Severe headaches. ? Abdomen (abdominal) pain or severe indigestion.  You fell and you think you have a broken bone.  You develop leg or chest pain.  You develop vision problems.  You feel a lump in your breast. Summary  Menopause is the normal time of life when menstrual periods stop completely. It is usually confirmed by 12 months without a menstrual period.  The transition to menopause (perimenopause) most often happens between the ages of 44 and 32.  Symptoms can be managed through medicines, lifestyle changes, and complementary therapies such as acupuncture.  Eat a balanced diet that is rich in nutrients to promote bone health and heart health and to manage symptoms during menopause. This information is not intended to replace advice given to you by your health care provider. Make sure you discuss any questions you have with your health care provider. Document Revised: 02/04/2017 Document Reviewed: 03/27/2016 Elsevier Patient Education  2020 [employer].

## 2019-03-21 NOTE — Assessment & Plan Note (Signed)
Provided patient with information packet on menopause We will schedule an appointment in a month to discuss menopause as well as optional hormone replacement therapy.Marland Kitchen

## 2019-03-21 NOTE — Assessment & Plan Note (Addendum)
Continue to follow-up with physical therapy referral Checked PDMP and refilled pain medication.  We will continue to keep chronic pain meds at this level until assessed and treated with physical therapy.

## 2019-03-21 NOTE — Assessment & Plan Note (Signed)
Treatment has been working well.  Refill prescription.

## 2019-03-22 DIAGNOSIS — E669 Obesity, unspecified: Secondary | ICD-10-CM | POA: Diagnosis not present

## 2019-03-23 DIAGNOSIS — E669 Obesity, unspecified: Secondary | ICD-10-CM | POA: Diagnosis not present

## 2019-03-24 DIAGNOSIS — E669 Obesity, unspecified: Secondary | ICD-10-CM | POA: Diagnosis not present

## 2019-03-25 DIAGNOSIS — E669 Obesity, unspecified: Secondary | ICD-10-CM | POA: Diagnosis not present

## 2019-03-26 ENCOUNTER — Ambulatory Visit: Payer: Medicaid Other | Admitting: Nurse Practitioner

## 2019-03-26 DIAGNOSIS — E669 Obesity, unspecified: Secondary | ICD-10-CM | POA: Diagnosis not present

## 2019-03-27 DIAGNOSIS — E669 Obesity, unspecified: Secondary | ICD-10-CM | POA: Diagnosis not present

## 2019-03-28 DIAGNOSIS — E669 Obesity, unspecified: Secondary | ICD-10-CM | POA: Diagnosis not present

## 2019-03-29 DIAGNOSIS — E669 Obesity, unspecified: Secondary | ICD-10-CM | POA: Diagnosis not present

## 2019-03-30 DIAGNOSIS — E669 Obesity, unspecified: Secondary | ICD-10-CM | POA: Diagnosis not present

## 2019-03-31 DIAGNOSIS — E669 Obesity, unspecified: Secondary | ICD-10-CM | POA: Diagnosis not present

## 2019-04-01 DIAGNOSIS — E669 Obesity, unspecified: Secondary | ICD-10-CM | POA: Diagnosis not present

## 2019-04-02 DIAGNOSIS — E669 Obesity, unspecified: Secondary | ICD-10-CM | POA: Diagnosis not present

## 2019-04-03 ENCOUNTER — Ambulatory Visit: Payer: Medicaid Other | Admitting: Physician Assistant

## 2019-04-03 DIAGNOSIS — E669 Obesity, unspecified: Secondary | ICD-10-CM | POA: Diagnosis not present

## 2019-04-04 DIAGNOSIS — E669 Obesity, unspecified: Secondary | ICD-10-CM | POA: Diagnosis not present

## 2019-04-05 DIAGNOSIS — E669 Obesity, unspecified: Secondary | ICD-10-CM | POA: Diagnosis not present

## 2019-04-06 DIAGNOSIS — E669 Obesity, unspecified: Secondary | ICD-10-CM | POA: Diagnosis not present

## 2019-04-07 DIAGNOSIS — E669 Obesity, unspecified: Secondary | ICD-10-CM | POA: Diagnosis not present

## 2019-04-08 DIAGNOSIS — E669 Obesity, unspecified: Secondary | ICD-10-CM | POA: Diagnosis not present

## 2019-04-09 DIAGNOSIS — E669 Obesity, unspecified: Secondary | ICD-10-CM | POA: Diagnosis not present

## 2019-04-10 DIAGNOSIS — E669 Obesity, unspecified: Secondary | ICD-10-CM | POA: Diagnosis not present

## 2019-04-11 DIAGNOSIS — E669 Obesity, unspecified: Secondary | ICD-10-CM | POA: Diagnosis not present

## 2019-04-12 DIAGNOSIS — E669 Obesity, unspecified: Secondary | ICD-10-CM | POA: Diagnosis not present

## 2019-04-13 DIAGNOSIS — E669 Obesity, unspecified: Secondary | ICD-10-CM | POA: Diagnosis not present

## 2019-04-14 DIAGNOSIS — E669 Obesity, unspecified: Secondary | ICD-10-CM | POA: Diagnosis not present

## 2019-04-15 DIAGNOSIS — E669 Obesity, unspecified: Secondary | ICD-10-CM | POA: Diagnosis not present

## 2019-04-16 DIAGNOSIS — E669 Obesity, unspecified: Secondary | ICD-10-CM | POA: Diagnosis not present

## 2019-04-17 DIAGNOSIS — E669 Obesity, unspecified: Secondary | ICD-10-CM | POA: Diagnosis not present

## 2019-04-18 DIAGNOSIS — E669 Obesity, unspecified: Secondary | ICD-10-CM | POA: Diagnosis not present

## 2019-04-19 DIAGNOSIS — E669 Obesity, unspecified: Secondary | ICD-10-CM | POA: Diagnosis not present

## 2019-04-20 DIAGNOSIS — E669 Obesity, unspecified: Secondary | ICD-10-CM | POA: Diagnosis not present

## 2019-04-21 DIAGNOSIS — E669 Obesity, unspecified: Secondary | ICD-10-CM | POA: Diagnosis not present

## 2019-04-22 DIAGNOSIS — E669 Obesity, unspecified: Secondary | ICD-10-CM | POA: Diagnosis not present

## 2019-04-23 ENCOUNTER — Encounter: Payer: Self-pay | Admitting: Student in an Organized Health Care Education/Training Program

## 2019-04-23 ENCOUNTER — Ambulatory Visit (INDEPENDENT_AMBULATORY_CARE_PROVIDER_SITE_OTHER): Payer: Medicaid Other | Admitting: Student in an Organized Health Care Education/Training Program

## 2019-04-23 ENCOUNTER — Other Ambulatory Visit: Payer: Self-pay

## 2019-04-23 VITALS — BP 102/70 | HR 75 | Ht 68.0 in | Wt 254.0 lb

## 2019-04-23 DIAGNOSIS — F329 Major depressive disorder, single episode, unspecified: Secondary | ICD-10-CM | POA: Diagnosis not present

## 2019-04-23 DIAGNOSIS — R1901 Right upper quadrant abdominal swelling, mass and lump: Secondary | ICD-10-CM | POA: Insufficient documentation

## 2019-04-23 DIAGNOSIS — Z780 Asymptomatic menopausal state: Secondary | ICD-10-CM | POA: Diagnosis not present

## 2019-04-23 DIAGNOSIS — I890 Lymphedema, not elsewhere classified: Secondary | ICD-10-CM

## 2019-04-23 DIAGNOSIS — F419 Anxiety disorder, unspecified: Secondary | ICD-10-CM | POA: Diagnosis not present

## 2019-04-23 DIAGNOSIS — F32A Depression, unspecified: Secondary | ICD-10-CM

## 2019-04-23 DIAGNOSIS — E669 Obesity, unspecified: Secondary | ICD-10-CM | POA: Diagnosis not present

## 2019-04-23 MED ORDER — OXYCODONE HCL 5 MG PO TABS: 5.0000 mg | ORAL_TABLET | Freq: Two times a day (BID) | ORAL | 0 refills | Status: DC | PRN

## 2019-04-23 MED ORDER — QUETIAPINE FUMARATE 50 MG PO TABS: ORAL_TABLET | ORAL | 0 refills | Status: DC

## 2019-04-23 MED ORDER — ESTRADIOL-PROGESTERONE 1-100 MG PO CAPS: 1.0000 mg | ORAL_CAPSULE | Freq: Every day | ORAL | 0 refills | Status: DC

## 2019-04-23 NOTE — Progress Notes (Signed)
   CHIEF COMPLAINT / HPI: follow up for medication refills and to initiate hormone replacement for menopausal symptoms.   Menopausal symptoms- main concerns are mood instability and vaginal dryness.  Mood instability is described as easily frustrated and temperamental even with grandchildren.  She has quick regret for losing her temper and finds her self apologizing almost daily.  This is not like her typical behavior prior to onset of symptoms.  Her vaginal dryness is improved with the topical estrogen creams.  She also has difficulties falling asleep which is resolved with Seroquel.  She has not smoked for over a month and only smoked 1 to 2 cigarettes the prior month on the anniversary of her mother's death.  Denies history of blood clots. No history of hysterectomy. Had BTL.  Lymphedema-patient states that her condition is unchanged.  She does not use compression stockings.  Has not heard anything about physical therapy yet. Requesting handicap placard paperwork for lymphedema  Abdominal mass-has been present for several years and right upper quadrant.  Is associated with intermittent red circle on skin.  Not palpable when laying down but is when standing.  Not associated with postprandial pain, nausea, fever, jaundice.  Ultrasound, CT, HIDA scan 2 years ago were negative for gallbladder disease. Seems to be getting more noticeable. No h/o cholecystectomy.  Patient has not eaten yet this morning.  PERTINENT  PMH / PSH: vaginal dryness, anxiety/depression, occasional smoker   OBJECTIVE: BP 102/70   Pulse 75   Ht 5\' 8"  (1.727 m)   Wt 254 lb (115.2 kg)   LMP 04/09/2019   SpO2 98%   BMI 38.62 kg/m    General: NAD, pleasant, able to participate in exam Cardiac: RRR, normal heart sounds, no murmurs. 2+ radial and PT pulses bilaterally Respiratory: CTAB, normal effort, No wheezes, rales or rhonchi Abdomen: soft, nontender, nondistended, no hepatic or splenomegaly, +BS No mass palpated when  supine. When standing, can feel mobile, hardened, round mass approximately 5cm diameter which is tender to palpation located in RUQ approximate location of gallbladder. No skin changes.  Extremities: severe LE edema without ulcers. Sensation intact.  Skin: warm and dry, no rashes noted. Non-jandiced Neuro: alert and oriented x4, no focal deficits Psych: Normal affect and mood  ASSESSMENT / PLAN:  Menopause Initiating estrogen/progesterone hormone replacement for symptomatic relief. Had long discussion on benefits, risks, variations of therapy. Provided patient with informational handout on medication. Follow-up in 2 to 3 weeks to reassess symptoms  Lymphedema Follow-up with PT orders. Completed handicap placard application  Right upper quadrant abdominal mass Subjectively enlarging per patient. Palpable and tender on exam without obvious jaundice or fevers. Liver enzymes normal 01/2019 Ordered right upper quadrant ultrasound.  Last imaging was 2 years ago and was normal.    Richarda Osmond, [place]

## 2019-04-23 NOTE — Assessment & Plan Note (Signed)
Initiating estrogen/progesterone hormone replacement for symptomatic relief. Had long discussion on benefits, risks, variations of therapy. Provided patient with informational handout on medication. Follow-up in 2 to 3 weeks to reassess symptoms

## 2019-04-23 NOTE — Assessment & Plan Note (Signed)
Subjectively enlarging per patient. Palpable and tender on exam without obvious jaundice or fevers. Liver enzymes normal 01/2019 Ordered right upper quadrant ultrasound.  Last imaging was 2 years ago and was normal.

## 2019-04-23 NOTE — Patient Instructions (Signed)
It was a pleasure to see you today!  To summarize our discussion for this visit:  I will refill your medications. Please wait until this afternoon for me to have time to fill them.  I will check on PT again  We are starting hormone replacement therapy today. Please call if you are experiencing any symptoms out of normal  I am scheduling you for a right upper quadrant ultrasound to evaluate your stomach bump. Please do not eat anything prior to your exam so your gallbladder is full and easier to see.  Please return to our clinic to see me in 2-3 weeks.  Call the clinic at [phone] if your symptoms worsen or you have any concerns.   Thank you for allowing me to take part in your care,  Dr. Doristine Mango  Ethinyl Estradiol; Norethindrone Acetate (estrogen replacement) What is this medicine? ETHINYL ESTRADIOL; NORETHINDRONE ACETATE (ETh in il es tra DYE ole; nor eth IN drone AS e tate) is used as hormone replacement in menopausal women who still have their uterus. This product helps to treat hot flashes and prevent osteoporosis (weak bones). This medicine may be used for other purposes; ask your health care provider or pharmacist if you have questions. COMMON BRAND NAME(S): femhrt 1/5, Paulina Fusi, Chrisman, Jinteli 1/5 What should I tell my health care provider before I take this medicine? They need to know if you have any of these conditions:  blood vessel disease or blood clots  breast, cervical, endometrial, or uterine cancer  diabetes  endometriosis  fibroids  gallbladder disease  heart disease or recent heart attack  high blood cholesterol  high blood pressure  high level of calcium in the blood  hysterectomy  kidney disease  liver disease  mental depression  migraine headaches  porphyria  systemic lupus erythematosus (SLE)  tobacco smoker  stroke  vaginal bleeding  an unusual or allergic reaction to estrogens, progestins, other  medicines, foods, dyes, or preservatives  pregnant or trying to get pregnant  breast-feeding How should I use this medicine? Take this medicine by mouth with a drink of water. You may take this medicine with food. Follow the directions on the prescription label. You will take one tablet daily at roughly the same time each day. Do not take your medicine more often than directed. Talk to your pediatrician regarding the use of this medicine in children. Special care may be needed. A patient package insert for the product will be given with each prescription and refill. Read this sheet carefully each time. The sheet may change frequently. Overdosage: If you think you have taken too much of this medicine contact a poison control center or emergency room at once. NOTE: This medicine is only for you. Do not share this medicine with others. What if I miss a dose? If you miss a dose, take it as soon as you can. If it is almost time for your next dose, take only that dose. Do not take double or extra doses. What may interact with this medicine? Do not take this medicine with the following medication:  dasabuvir; ombitasvir; paritaprevir; ritonavir  ombitasvir; paritaprevir; ritonavir This medicine may also interact with the following medications:  acetaminophen  antibiotics or medicines for infections, especially rifampin, rifabutin, rifapentine, and griseofulvin, and possibly penicillins or tetracyclines  aprepitant  ascorbic acid (vitamin C)  atorvastatin  barbiturates, such as phenobarbital  benzodiazepines  bosentan  bromocriptine  carbamazepine  caffeine  clofibrate  cimetidine  cyclosporine  dantrolene  doxercalciferol  grapefruit juice  felbamate  hydrocortisone, cortisone  isoniazid (INH)  medications for diabetes, including pioglitazone  medicines for anxiety or sleeping problems, such as diazepam or temazepam  methotrexate  mineral  oil  modafinil  mycophenolate  nefazodone  oxcarbazepine  phenytoin  prednisolone  raloxifene  ritonavir or other medicines for HIV infection or AIDS  rosuvastatin  selegiline  soy isoflavones supplements  St. John's wort  tamoxifen  theophylline  thyroid hormones  topiramate  tricyclic antidepressants  warfarin This list may not describe all possible interactions. Give your health care provider a list of all the medicines, herbs, non-prescription drugs, or dietary supplements you use. Also tell them if you smoke, drink alcohol, or use illegal drugs. Some items may interact with your medicine. What should I watch for while using this medicine? Visit your health care professional for regular checks on your progress. You should have a complete check-up every 6 months. You will need a regular breast and pelvic exam. You should also discuss the need for regular mammograms with your health care professional, and follow his or her guidelines. This medicine can make your body retain fluid, making your fingers, hands, or ankles swell. Your blood pressure can go up. Contact your doctor or health care professional if you feel you are retaining fluid. If you have any reason to think you are pregnant; stop taking this medicine at once and contact your doctor or health care professional. Tobacco smoking increases the risk of getting a blood clot or having a stroke, especially if you are more than 54 years old. You are strongly advised not to smoke. If you wear contact lenses and notice visual changes, or if the lenses begin to feel uncomfortable, consult your eye care specialist. If you are going to have elective surgery, you may need to stop taking this medicine beforehand. Consult your health care professional for advice prior to scheduling the surgery. What side effects may I notice from receiving this medicine? Side effects that you should report to your doctor or health care  professional as soon as possible:  breakthrough bleeding and spotting  breast enlargement, tenderness, or discharge  chest pain  leg, arm, or groin pain  severe headaches  stomach or abdominal pain (severe)  sudden shortness of breath  swelling of the hands, feet or ankles, or rapid weight gain  vaginal yeast infection (irritation and white discharge)  vision or speech problems  yellowing of the eyes or skin Side effects that usually do not require medical attention (report to your doctor or health care professional if they continue or are bothersome):  change in appetite  change in sexual desire  mild stomach upset  mood changes, anxiety, depression, frustration, anger, or emotional outbursts  nausea  skin rash, acne, or brown spots on the face  tiredness  weight gain This list may not describe all possible side effects. Call your doctor for medical advice about side effects. You may report side effects to FDA at 1-800-FDA-1088. Where should I keep my medicine? Keep out of the reach of children. Store at room temperature between 15 and 30 degrees C (59 and 86 degrees F). Throw away any unused medicine after the expiration date. NOTE: This sheet is a summary. It may not cover all possible information. If you have questions about this medicine, talk to your doctor, pharmacist, or health care provider.  2020 Elsevier/Gold Standard (2015-11-03 07:59:39)

## 2019-04-23 NOTE — Assessment & Plan Note (Signed)
Follow-up with PT orders. Completed handicap placard application

## 2019-04-24 ENCOUNTER — Telehealth: Payer: Self-pay | Admitting: Student in an Organized Health Care Education/Training Program

## 2019-04-24 ENCOUNTER — Other Ambulatory Visit: Payer: Self-pay | Admitting: Family Medicine

## 2019-04-24 ENCOUNTER — Telehealth: Payer: Self-pay

## 2019-04-24 DIAGNOSIS — E669 Obesity, unspecified: Secondary | ICD-10-CM | POA: Diagnosis not present

## 2019-04-24 NOTE — Telephone Encounter (Signed)
Patient calls nurse line stating that the new rx for estradiol-progesterone 1-100 mg caps is not covered by medicaid.   Called pharmacy, when running the medication they are not able to find any preferred drugs that medicaid will pay for. Requested for pharmacy to fax prior authorization to office. Awaiting fax.   Patient informed.   To PCP  Talbot Grumbling, RN

## 2019-04-24 NOTE — Telephone Encounter (Signed)
Bijuva not covered by Medicaid.  Please see below of formulary.  Let "RN Team" know if you are changing to covered medications or would like to pursue a PA. Christen Bame, CMA

## 2019-04-25 DIAGNOSIS — E669 Obesity, unspecified: Secondary | ICD-10-CM | POA: Diagnosis not present

## 2019-04-27 ENCOUNTER — Other Ambulatory Visit: Payer: Medicaid Other

## 2019-04-27 DIAGNOSIS — E669 Obesity, unspecified: Secondary | ICD-10-CM | POA: Diagnosis not present

## 2019-04-27 MED ORDER — ESTRADIOL-NORETHINDRONE ACET 1-0.5 MG PO TABS: 1.0000 | ORAL_TABLET | Freq: Every day | ORAL | 1 refills | Status: DC

## 2019-04-27 NOTE — Telephone Encounter (Signed)
I have sent in a prescription for one of the preferred medications Clent Ridges).

## 2019-04-28 DIAGNOSIS — E669 Obesity, unspecified: Secondary | ICD-10-CM | POA: Diagnosis not present

## 2019-04-29 DIAGNOSIS — E669 Obesity, unspecified: Secondary | ICD-10-CM | POA: Diagnosis not present

## 2019-04-30 DIAGNOSIS — E669 Obesity, unspecified: Secondary | ICD-10-CM | POA: Diagnosis not present

## 2019-04-30 NOTE — Telephone Encounter (Signed)
Patient informed of alternative.

## 2019-05-01 ENCOUNTER — Telehealth: Payer: Self-pay | Admitting: Student in an Organized Health Care Education/Training Program

## 2019-05-01 DIAGNOSIS — E669 Obesity, unspecified: Secondary | ICD-10-CM | POA: Diagnosis not present

## 2019-05-01 NOTE — Telephone Encounter (Signed)
Patient calling for her handicap placard. Patient says Dr. Ouida Sills did not have time to complete it at her last visit on 04/23/2019 and would leave it up front for patient to pick up, but form is not there. Printed a new form and will place in Anderson's box to sign when she is back in on 05/02/2019. Please call patient when this has been completed.

## 2019-05-02 ENCOUNTER — Other Ambulatory Visit: Payer: Medicaid Other

## 2019-05-02 DIAGNOSIS — E669 Obesity, unspecified: Secondary | ICD-10-CM | POA: Diagnosis not present

## 2019-05-02 NOTE — Telephone Encounter (Signed)
Pt called to check status.  Completed form was found in bottom of PCP's box.  Pt informed that she may come to pick up.  Copy made for batch scanning. Christen Bame, CMA

## 2019-05-03 DIAGNOSIS — E669 Obesity, unspecified: Secondary | ICD-10-CM | POA: Diagnosis not present

## 2019-05-03 NOTE — Telephone Encounter (Signed)
Thank you :)

## 2019-05-04 DIAGNOSIS — E669 Obesity, unspecified: Secondary | ICD-10-CM | POA: Diagnosis not present

## 2019-05-05 DIAGNOSIS — E669 Obesity, unspecified: Secondary | ICD-10-CM | POA: Diagnosis not present

## 2019-05-06 DIAGNOSIS — E669 Obesity, unspecified: Secondary | ICD-10-CM | POA: Diagnosis not present

## 2019-05-07 ENCOUNTER — Other Ambulatory Visit: Payer: Medicaid Other

## 2019-05-07 DIAGNOSIS — E669 Obesity, unspecified: Secondary | ICD-10-CM | POA: Diagnosis not present

## 2019-05-08 DIAGNOSIS — E669 Obesity, unspecified: Secondary | ICD-10-CM | POA: Diagnosis not present

## 2019-05-09 DIAGNOSIS — E669 Obesity, unspecified: Secondary | ICD-10-CM | POA: Diagnosis not present

## 2019-05-10 DIAGNOSIS — E669 Obesity, unspecified: Secondary | ICD-10-CM | POA: Diagnosis not present

## 2019-05-11 ENCOUNTER — Ambulatory Visit
Admission: RE | Admit: 2019-05-11 | Discharge: 2019-05-11 | Disposition: A | Discharge status: Home / Self Care | Payer: Medicaid Other | Source: Ambulatory Visit | Attending: Family Medicine | Admitting: Family Medicine

## 2019-05-11 ENCOUNTER — Other Ambulatory Visit: Payer: Self-pay | Admitting: Family Medicine

## 2019-05-11 DIAGNOSIS — R1901 Right upper quadrant abdominal swelling, mass and lump: Secondary | ICD-10-CM

## 2019-05-11 DIAGNOSIS — R109 Unspecified abdominal pain: Secondary | ICD-10-CM | POA: Diagnosis not present

## 2019-05-11 DIAGNOSIS — E669 Obesity, unspecified: Secondary | ICD-10-CM | POA: Diagnosis not present

## 2019-05-12 DIAGNOSIS — E669 Obesity, unspecified: Secondary | ICD-10-CM | POA: Diagnosis not present

## 2019-05-13 DIAGNOSIS — E669 Obesity, unspecified: Secondary | ICD-10-CM | POA: Diagnosis not present

## 2019-05-14 DIAGNOSIS — E669 Obesity, unspecified: Secondary | ICD-10-CM | POA: Diagnosis not present

## 2019-05-15 ENCOUNTER — Other Ambulatory Visit: Payer: Self-pay

## 2019-05-15 ENCOUNTER — Telehealth (INDEPENDENT_AMBULATORY_CARE_PROVIDER_SITE_OTHER): Payer: Medicaid Other | Admitting: Student in an Organized Health Care Education/Training Program

## 2019-05-15 DIAGNOSIS — I890 Lymphedema, not elsewhere classified: Secondary | ICD-10-CM

## 2019-05-15 DIAGNOSIS — S99929A Unspecified injury of unspecified foot, initial encounter: Secondary | ICD-10-CM | POA: Insufficient documentation

## 2019-05-15 DIAGNOSIS — Z780 Asymptomatic menopausal state: Secondary | ICD-10-CM

## 2019-05-15 DIAGNOSIS — R1901 Right upper quadrant abdominal swelling, mass and lump: Secondary | ICD-10-CM | POA: Diagnosis not present

## 2019-05-15 DIAGNOSIS — X58XXXA Exposure to other specified factors, initial encounter: Secondary | ICD-10-CM

## 2019-05-15 DIAGNOSIS — S99921A Unspecified injury of right foot, initial encounter: Secondary | ICD-10-CM

## 2019-05-15 DIAGNOSIS — E669 Obesity, unspecified: Secondary | ICD-10-CM | POA: Diagnosis not present

## 2019-05-15 NOTE — Telephone Encounter (Signed)
Jarrett Soho, home health PT, calls back from [facility]. Jarrett Soho did not realize the patients insurance is medicaid, and they do not accept this. The referral needs to be rerouted to another agency.   Will forward to [place].

## 2019-05-15 NOTE — Telephone Encounter (Signed)
[facility] calls nurse line stating they are in the process of getting the patient scheduled. They will call our office after the initial evaluation to determine if they can help her or not.

## 2019-05-15 NOTE — Assessment & Plan Note (Signed)
Taking estrogen/progesterone supplement without negative side effects. Appears to have improvement in moods which was her biggest complaint. Continue current medication. Follow-up in 1 month.

## 2019-05-15 NOTE — Progress Notes (Signed)
[facility] Visit  Patient consented to have virtual visit. Method of visit: Video  Encounter participants: Patient: [name] - located at home Provider: Richarda Osmond - located at [facility] Others (if applicable): none  Chief Complaint: medication refill, [place] results  HPI:  Patient had in person appointment today but was unable to make it as she hurt her right 4th toe on a dresser.  She is still able to ambulate and has full range of motion of the toe.  Denies any skin breakage or bruising or swelling.  She used ibuprofen for pain which did help and wants to know if she should use heat or cold for treatment.  PT-patient has not been able to follow-up with PT at this point.  Referral site information is in chart and patient wrote on this information to call them after our appointment.  She picked up her handicap placard and is grateful for this.  US-reviewed ultrasound with patient which showed no cause of her mass.   Estrogen- taken. No negative side effects. Not as moody. Light spotting first week of month and last week of month.   Med refill.  ROS: per HPI  Pertinent PMHx: LE lymphedema, arthritis, menopause  Exam:  Respiratory: no conversational dyspnea R 4th toe= no ecchymosis or swelling out of proportion to her chronic. Full flexion and extension. Mildly tender to palpation by patient.  Assessment/Plan:  Menopause Taking estrogen/progesterone supplement without negative side effects. Appears to have improvement in moods which was her biggest complaint. Continue current medication. Follow-up in 1 month.  Lymphedema PT referral in but patient has not heard from them. [facility] is to [facility] at [place]. [place]. Provided patient with their address and phone number and instructions to call them to set up an appointment.  Right upper quadrant abdominal mass Reviewed ultrasound results with patient which  were unremarkable. Requested that she monitor the mass to see if it changes with food intake and described gallbladder physiology. Patient will report back her findings on her next appointment.  Toe injury Toe viewed on video which did not show ecchymosis, edema, erythema.  Had full active range of motion. Recommended patient can use cold or warm depending on what feels better for her. Ibuprofen for pain as needed for short period of time. If not improving in a day or so she can call to let me know and we can schedule an x-ray    Time spent during visit with patient: 23 minutes

## 2019-05-15 NOTE — Assessment & Plan Note (Signed)
Toe viewed on video which did not show ecchymosis, edema, erythema.  Had full active range of motion. Recommended patient can use cold or warm depending on what feels better for her. Ibuprofen for pain as needed for short period of time. If not improving in a day or so she can call to let me know and we can schedule an x-ray

## 2019-05-15 NOTE — Assessment & Plan Note (Signed)
Reviewed ultrasound results with patient which were unremarkable. Requested that she monitor the mass to see if it changes with food intake and described gallbladder physiology. Patient will report back her findings on her next appointment.

## 2019-05-15 NOTE — Assessment & Plan Note (Signed)
PT referral in but patient has not heard from them. Referral is to [facility] at [place]. [name]. Provided patient with their address and phone number and instructions to call them to set up an appointment.

## 2019-05-16 DIAGNOSIS — E669 Obesity, unspecified: Secondary | ICD-10-CM | POA: Diagnosis not present

## 2019-05-17 DIAGNOSIS — E669 Obesity, unspecified: Secondary | ICD-10-CM | POA: Diagnosis not present

## 2019-05-17 NOTE — Telephone Encounter (Signed)
thanks

## 2019-05-18 DIAGNOSIS — E669 Obesity, unspecified: Secondary | ICD-10-CM | POA: Diagnosis not present

## 2019-05-19 DIAGNOSIS — E669 Obesity, unspecified: Secondary | ICD-10-CM | POA: Diagnosis not present

## 2019-05-20 DIAGNOSIS — E669 Obesity, unspecified: Secondary | ICD-10-CM | POA: Diagnosis not present

## 2019-05-21 ENCOUNTER — Other Ambulatory Visit: Payer: Self-pay

## 2019-05-21 DIAGNOSIS — E669 Obesity, unspecified: Secondary | ICD-10-CM | POA: Diagnosis not present

## 2019-05-22 DIAGNOSIS — E669 Obesity, unspecified: Secondary | ICD-10-CM | POA: Diagnosis not present

## 2019-05-22 MED ORDER — OXYCODONE HCL 5 MG PO TABS: 5.0000 mg | ORAL_TABLET | Freq: Two times a day (BID) | ORAL | 0 refills | Status: DC | PRN

## 2019-05-23 DIAGNOSIS — E669 Obesity, unspecified: Secondary | ICD-10-CM | POA: Diagnosis not present

## 2019-05-24 DIAGNOSIS — E669 Obesity, unspecified: Secondary | ICD-10-CM | POA: Diagnosis not present

## 2019-05-25 DIAGNOSIS — E669 Obesity, unspecified: Secondary | ICD-10-CM | POA: Diagnosis not present

## 2019-05-26 DIAGNOSIS — E669 Obesity, unspecified: Secondary | ICD-10-CM | POA: Diagnosis not present

## 2019-05-27 DIAGNOSIS — E669 Obesity, unspecified: Secondary | ICD-10-CM | POA: Diagnosis not present

## 2019-05-28 DIAGNOSIS — E669 Obesity, unspecified: Secondary | ICD-10-CM | POA: Diagnosis not present

## 2019-05-29 DIAGNOSIS — E669 Obesity, unspecified: Secondary | ICD-10-CM | POA: Diagnosis not present

## 2019-05-30 DIAGNOSIS — E669 Obesity, unspecified: Secondary | ICD-10-CM | POA: Diagnosis not present

## 2019-05-31 DIAGNOSIS — E669 Obesity, unspecified: Secondary | ICD-10-CM | POA: Diagnosis not present

## 2019-06-01 DIAGNOSIS — E669 Obesity, unspecified: Secondary | ICD-10-CM | POA: Diagnosis not present

## 2019-06-02 DIAGNOSIS — E669 Obesity, unspecified: Secondary | ICD-10-CM | POA: Diagnosis not present

## 2019-06-03 DIAGNOSIS — E669 Obesity, unspecified: Secondary | ICD-10-CM | POA: Diagnosis not present

## 2019-06-04 DIAGNOSIS — E669 Obesity, unspecified: Secondary | ICD-10-CM | POA: Diagnosis not present

## 2019-06-05 DIAGNOSIS — E669 Obesity, unspecified: Secondary | ICD-10-CM | POA: Diagnosis not present

## 2019-06-06 DIAGNOSIS — E669 Obesity, unspecified: Secondary | ICD-10-CM | POA: Diagnosis not present

## 2019-06-07 DIAGNOSIS — E669 Obesity, unspecified: Secondary | ICD-10-CM | POA: Diagnosis not present

## 2019-06-08 DIAGNOSIS — E669 Obesity, unspecified: Secondary | ICD-10-CM | POA: Diagnosis not present

## 2019-06-09 DIAGNOSIS — E669 Obesity, unspecified: Secondary | ICD-10-CM | POA: Diagnosis not present

## 2019-06-10 DIAGNOSIS — E669 Obesity, unspecified: Secondary | ICD-10-CM | POA: Diagnosis not present

## 2019-06-11 DIAGNOSIS — E669 Obesity, unspecified: Secondary | ICD-10-CM | POA: Diagnosis not present

## 2019-06-12 DIAGNOSIS — E669 Obesity, unspecified: Secondary | ICD-10-CM | POA: Diagnosis not present

## 2019-06-13 DIAGNOSIS — E669 Obesity, unspecified: Secondary | ICD-10-CM | POA: Diagnosis not present

## 2019-06-14 ENCOUNTER — Telehealth: Payer: Self-pay

## 2019-06-14 ENCOUNTER — Other Ambulatory Visit: Payer: Self-pay | Admitting: Student in an Organized Health Care Education/Training Program

## 2019-06-14 DIAGNOSIS — E669 Obesity, unspecified: Secondary | ICD-10-CM | POA: Diagnosis not present

## 2019-06-15 DIAGNOSIS — E669 Obesity, unspecified: Secondary | ICD-10-CM | POA: Diagnosis not present

## 2019-06-16 DIAGNOSIS — E669 Obesity, unspecified: Secondary | ICD-10-CM | POA: Diagnosis not present

## 2019-06-17 DIAGNOSIS — E669 Obesity, unspecified: Secondary | ICD-10-CM | POA: Diagnosis not present

## 2019-06-18 DIAGNOSIS — E669 Obesity, unspecified: Secondary | ICD-10-CM | POA: Diagnosis not present

## 2019-06-21 ENCOUNTER — Telehealth: Payer: Self-pay

## 2019-06-21 DIAGNOSIS — E669 Obesity, unspecified: Secondary | ICD-10-CM | POA: Diagnosis not present

## 2019-06-21 NOTE — Telephone Encounter (Signed)
It really depends on the type of dizziness and symptoms she is having. There is no medication really that addresses dizziness directly. Most medications are directed at relieving nausea and other sequelae of dizziness.  A common cause of dizziness would be orthostasis, therefore would encourage her to stay hydrated. Dr. Ouida Sills is out of town and I am currently on nights. I will call her in the morning at the end of my shift.  She may benefit from a virtual visit 06/22/19. It looks like there are a few open appointments.

## 2019-06-21 NOTE — Telephone Encounter (Signed)
Patient calls nurse line stating she gets her 2nd covid vaccine on Monday and is requesting something to help her with feeling dizzy. Patient stated with the 1st vaccine she was dizzy for most of the next day. Patient does have zofran on hand already. I advised patient I will forward to PCP.

## 2019-06-22 DIAGNOSIS — E669 Obesity, unspecified: Secondary | ICD-10-CM | POA: Diagnosis not present

## 2019-06-23 DIAGNOSIS — E669 Obesity, unspecified: Secondary | ICD-10-CM | POA: Diagnosis not present

## 2019-06-24 DIAGNOSIS — E669 Obesity, unspecified: Secondary | ICD-10-CM | POA: Diagnosis not present

## 2019-06-25 DIAGNOSIS — E669 Obesity, unspecified: Secondary | ICD-10-CM | POA: Diagnosis not present

## 2019-06-26 DIAGNOSIS — E669 Obesity, unspecified: Secondary | ICD-10-CM | POA: Diagnosis not present

## 2019-06-27 DIAGNOSIS — E669 Obesity, unspecified: Secondary | ICD-10-CM | POA: Diagnosis not present

## 2019-06-27 NOTE — Telephone Encounter (Signed)
Opened in error  .Deone Leifheit R Mariadelaluz Guggen[place], CMA  

## 2019-06-28 ENCOUNTER — Ambulatory Visit: Payer: Medicaid Other | Admitting: Physician Assistant

## 2019-07-03 ENCOUNTER — Other Ambulatory Visit: Payer: Self-pay

## 2019-07-03 ENCOUNTER — Ambulatory Visit (INDEPENDENT_AMBULATORY_CARE_PROVIDER_SITE_OTHER): Payer: Medicaid Other | Admitting: Student in an Organized Health Care Education/Training Program

## 2019-07-03 ENCOUNTER — Encounter: Payer: Self-pay | Admitting: Student in an Organized Health Care Education/Training Program

## 2019-07-03 VITALS — BP 110/70 | HR 68 | Temp 98.4°F | Wt 250.0 lb

## 2019-07-03 DIAGNOSIS — E890 Postprocedural hypothyroidism: Secondary | ICD-10-CM

## 2019-07-03 DIAGNOSIS — Z79890 Hormone replacement therapy (postmenopausal): Secondary | ICD-10-CM

## 2019-07-03 DIAGNOSIS — N644 Mastodynia: Secondary | ICD-10-CM

## 2019-07-03 DIAGNOSIS — F329 Major depressive disorder, single episode, unspecified: Secondary | ICD-10-CM

## 2019-07-03 DIAGNOSIS — R5382 Chronic fatigue, unspecified: Secondary | ICD-10-CM | POA: Diagnosis not present

## 2019-07-03 DIAGNOSIS — Z5181 Encounter for therapeutic drug level monitoring: Secondary | ICD-10-CM | POA: Diagnosis not present

## 2019-07-03 DIAGNOSIS — E785 Hyperlipidemia, unspecified: Secondary | ICD-10-CM | POA: Diagnosis not present

## 2019-07-03 DIAGNOSIS — E038 Other specified hypothyroidism: Secondary | ICD-10-CM | POA: Diagnosis not present

## 2019-07-03 DIAGNOSIS — Z1231 Encounter for screening mammogram for malignant neoplasm of breast: Secondary | ICD-10-CM | POA: Diagnosis not present

## 2019-07-03 DIAGNOSIS — F419 Anxiety disorder, unspecified: Secondary | ICD-10-CM

## 2019-07-03 DIAGNOSIS — Z780 Asymptomatic menopausal state: Secondary | ICD-10-CM

## 2019-07-03 DIAGNOSIS — L301 Dyshidrosis [pompholyx]: Secondary | ICD-10-CM | POA: Diagnosis not present

## 2019-07-03 DIAGNOSIS — R531 Weakness: Secondary | ICD-10-CM | POA: Diagnosis not present

## 2019-07-03 DIAGNOSIS — H5789 Other specified disorders of eye and adnexa: Secondary | ICD-10-CM | POA: Diagnosis not present

## 2019-07-03 MED ORDER — OXYCODONE HCL 5 MG PO TABS: 5.0000 mg | ORAL_TABLET | Freq: Two times a day (BID) | ORAL | 0 refills | Status: DC | PRN

## 2019-07-03 MED ORDER — LYRICA 150 MG PO CAPS: 150.0000 mg | ORAL_CAPSULE | Freq: Two times a day (BID) | ORAL | 1 refills | Status: DC

## 2019-07-03 MED ORDER — QUETIAPINE FUMARATE 50 MG PO TABS: ORAL_TABLET | ORAL | 0 refills | Status: DC

## 2019-07-03 MED ORDER — PANTOPRAZOLE SODIUM 40 MG PO TBEC: 40.0000 mg | DELAYED_RELEASE_TABLET | Freq: Two times a day (BID) | ORAL | 3 refills | Status: DC

## 2019-07-03 MED ORDER — CLOBETASOL PROPIONATE 0.05 % EX CREA: TOPICAL_CREAM | Freq: Two times a day (BID) | CUTANEOUS | 3 refills | Status: DC

## 2019-07-03 MED ORDER — PREMARIN 0.625 MG/GM VA CREA: TOPICAL_CREAM | VAGINAL | 0 refills | Status: DC

## 2019-07-03 MED ORDER — ALBUTEROL SULFATE HFA 108 (90 BASE) MCG/ACT IN AERS: INHALATION_SPRAY | RESPIRATORY_TRACT | 1 refills | Status: DC

## 2019-07-03 MED ORDER — PRAVASTATIN SODIUM 20 MG PO TABS: 20.0000 mg | ORAL_TABLET | Freq: Every day | ORAL | 3 refills | Status: DC

## 2019-07-03 MED ORDER — LEVOTHYROXINE SODIUM 200 MCG PO TABS: 200.0000 ug | ORAL_TABLET | Freq: Every day | ORAL | 2 refills | Status: DC

## 2019-07-03 NOTE — Progress Notes (Signed)
    SUBJECTIVE:   CHIEF COMPLAINT / HPI: breast soreness, eye iritation  Patient has several complaints today.   Bilateral breast tenderness- for about the last month. Has not noticed any skin changes, masses or nipple changes/discharge. Has not tried anything to improve the pain yet. She is consistently taking estrogen replacement. Due for mammogram.  Eye irritation- felt a gnat fly in her eye yesterday and was able to remove the FB but till has a sensation of burning irritation at the site with some erythema that is concerning to her. Has not tried anything for the pain. Denies photophobia, visual changes, discharge, or decreased EOM.   Medication refills- patient has several medications that need to be refilled. We reconciled her medication list.   Fatigue- patient is having generalized fatigue for the past month or two which is worsened by the end of the day and more prominent in her bilateral shoulder girdles. Has not had any fevers or rashes. Also has intermittent temporal headaches and a history of vision changes in right eye but states that has been stable for years and she needs a new prescription for her glasses.   PERTINENT  PMH / PSH: hypothyroidism, depression and anxiety  OBJECTIVE:   BP 110/70   Pulse 68   Temp 98.4 F (36.9 C) (Oral)   Wt 250 lb (113.4 kg)   SpO2 99%   BMI 38.01 kg/m   General: NAD, pleasant, able to participate in exam Chest: bilateral breasts positive for fibrous mobile tissue, mildly tender to palpation. Nipples normal.  Eyes: PERRL R eye- negative for FB, neg conjunctival pallor or erythema. EOM intact. Negative photophobia. Fluorescence exam negative for abrasions  Abdomen: soft, nontender, nondistended, no hepatic or splenomegaly, +BS Extremities: LE symmetrical and chronic edema, no cyanosis. WWP. Skin: warm and dry, no rashes noted Neuro: alert and oriented x4, no focal deficits Psych: Normal affect and anxious/mildly tearful  mood  ASSESSMENT/PLAN:   Breast tenderness in female Exam is benign. Likely 2/2 estrogen use.  Continue with normal mammogram screening Use warm compresses for symptomatic relief.  If not able to tolerate, will consider discontinuing estrogen use.   Menopause Continuing estrogen at this time.  Given breast soreness and tearfulness during exam, will consider discontinuing estrogen at next appointment and will highly consider initiating venlafaxine alternatively. Patient PHQ-9 negative (1)  Eye irritation Exam and Fluorescence negative for pathology. Follow up if not improving Can use lubricating eye drops for comfort Follow up with eye doctor for new prescription    Fatigue Ordered labs to assess fatigue including thyroid, DM, anemia, ESR for TA and PR. Unable to obtain the sample in lab so patient will have to return for blood draw.    Discussed with patient that for best use of our time together, we should pick the two most important things that she would like to address per appointment and we can meet more regularly if needed. She seemed tearful and like she was a bother with this suggestion and I reassured her that she is not a bother, I am happy to see her and help her. We will try to stay more focused at next visit.   [place]

## 2019-07-03 NOTE — Patient Instructions (Signed)
It was a pleasure to see you today!  To summarize our discussion for this visit:  I'm ALWAYS happy to see you and to help you and you are NEVER a bother.  I'm sorry to hear that you have been having eye problems. If it does not get better, let me know  For your weakness, we are checking some labs today  For your breast soreness, this seems to be a normal side effect of estrogen but I still recommend following up on your mammogram.   Some additional health maintenance measures we should update are: Health Maintenance Due  Topic Date Due  . COVID-19 Vaccine (1) Never done  . COLONOSCOPY  06/12/2019  .    Please return to our clinic to see me in a month or sooner if you need anything.  Call the clinic at [phone] if your symptoms worsen or you have any concerns.   Thank you for allowing me to take part in your care,  Dr. Doristine Mango

## 2019-07-05 ENCOUNTER — Other Ambulatory Visit: Payer: Self-pay

## 2019-07-05 ENCOUNTER — Other Ambulatory Visit: Payer: Medicaid Other

## 2019-07-05 DIAGNOSIS — R531 Weakness: Secondary | ICD-10-CM | POA: Diagnosis not present

## 2019-07-05 DIAGNOSIS — Z5181 Encounter for therapeutic drug level monitoring: Secondary | ICD-10-CM | POA: Diagnosis not present

## 2019-07-05 DIAGNOSIS — N644 Mastodynia: Secondary | ICD-10-CM | POA: Insufficient documentation

## 2019-07-05 DIAGNOSIS — Z79890 Hormone replacement therapy (postmenopausal): Secondary | ICD-10-CM | POA: Diagnosis not present

## 2019-07-05 DIAGNOSIS — E669 Obesity, unspecified: Secondary | ICD-10-CM | POA: Diagnosis not present

## 2019-07-05 DIAGNOSIS — H5789 Other specified disorders of eye and adnexa: Secondary | ICD-10-CM | POA: Insufficient documentation

## 2019-07-05 NOTE — Assessment & Plan Note (Signed)
Continuing estrogen at this time.  Given breast soreness and tearfulness during exam, will consider discontinuing estrogen at next appointment and will highly consider initiating venlafaxine alternatively. Patient PHQ-9 negative (1)

## 2019-07-05 NOTE — Assessment & Plan Note (Signed)
Exam and Fluorescence negative for pathology. Follow up if not improving Can use lubricating eye drops for comfort Follow up with eye doctor for new prescription

## 2019-07-05 NOTE — Assessment & Plan Note (Signed)
Exam is benign. Likely 2/2 estrogen use.  Continue with normal mammogram screening Use warm compresses for symptomatic relief.  If not able to tolerate, will consider discontinuing estrogen use.

## 2019-07-05 NOTE — Assessment & Plan Note (Signed)
Ordered labs to assess fatigue including thyroid, DM, anemia, ESR for TA and PR. Unable to obtain the sample in lab so patient will have to return for blood draw.

## 2019-07-06 ENCOUNTER — Other Ambulatory Visit: Payer: Self-pay | Admitting: Student in an Organized Health Care Education/Training Program

## 2019-07-06 LAB — LIPID PANEL
Chol/HDL Ratio: 3.4 ratio (ref 0.0–4.4)
Cholesterol, Total: 182 mg/dL (ref 100–199)
HDL: 53 mg/dL (ref >39)
LDL Chol Calc (NIH): 115 mg/dL — ABNORMAL HIGH (ref 0–99)
Triglycerides: 74 mg/dL (ref 0–149)
VLDL Cholesterol Cal: 14 mg/dL (ref 5–40)

## 2019-07-06 LAB — SEDIMENTATION RATE: Sed Rate: 30 mm/hr (ref 0–40)

## 2019-07-06 LAB — COMPREHENSIVE METABOLIC PANEL
ALT: 11 IU/L (ref 0–32)
AST: 13 IU/L (ref 0–40)
Albumin/Globulin Ratio: 1.4 (ref 1.2–2.2)
Albumin: 3.7 g/dL — ABNORMAL LOW (ref 3.8–4.9)
Alkaline Phosphatase: 87 IU/L (ref 39–117)
BUN/Creatinine Ratio: 10 (ref 9–23)
BUN: 9 mg/dL (ref 6–24)
Bilirubin Total: 0.3 mg/dL (ref 0.0–1.2)
CO2: 18 mmol/L — ABNORMAL LOW (ref 20–29)
Calcium: 8.3 mg/dL — ABNORMAL LOW (ref 8.7–10.2)
Chloride: 108 mmol/L — ABNORMAL HIGH (ref 96–106)
Creatinine, Ser: 0.89 mg/dL (ref 0.57–1.00)
GFR calc Af Amer: 85 mL/min/{1.73_m2} (ref >59)
GFR calc non Af Amer: 74 mL/min/{1.73_m2} (ref >59)
Globulin, Total: 2.6 g/dL (ref 1.5–4.5)
Glucose: 99 mg/dL (ref 65–99)
Potassium: 4.2 mmol/L (ref 3.5–5.2)
Sodium: 140 mmol/L (ref 134–144)
Total Protein: 6.3 g/dL (ref 6.0–8.5)

## 2019-07-06 LAB — CBC
Hematocrit: 41.4 % (ref 34.0–46.6)
Hemoglobin: 14 g/dL (ref 11.1–15.9)
MCH: 31.6 pg (ref 26.6–33.0)
MCHC: 33.8 g/dL (ref 31.5–35.7)
MCV: 94 fL (ref 79–97)
Platelets: 263 10*3/uL (ref 150–450)
RBC: 4.43 x10E6/uL (ref 3.77–5.28)
RDW: 11.9 % (ref 11.7–15.4)
WBC: 3 10*3/uL — ABNORMAL LOW (ref 3.4–10.8)

## 2019-07-06 LAB — C-REACTIVE PROTEIN: CRP: 10 mg/L (ref 0–10)

## 2019-07-06 LAB — TSH: TSH: <0.005 u[IU]/mL — ABNORMAL LOW (ref 0.450–4.500)

## 2019-07-06 LAB — HEMOGLOBIN A1C
Est. average glucose Bld gHb Est-mCnc: 111 mg/dL
Hgb A1c MFr Bld: 5.5 % (ref 4.8–5.6)

## 2019-07-06 MED ORDER — LEVOTHYROXINE SODIUM 175 MCG PO TABS: 175.0000 ug | ORAL_TABLET | Freq: Every day | ORAL | 0 refills | Status: DC

## 2019-07-06 NOTE — Progress Notes (Signed)
Called patient to notify of lab results.  She states that she is feeling much better and has improvement in her weakness/fatigue. She now attributes those symptoms to her recent COVID vaccine. Most of her labs were unremarkable.  Most notably, very low TSH. She has been adherent with 253mcg daily levothyroxine.  Discussed decreasing dose and repeating lab at next appointment.  - sent in 156mcg levothyroxine to pharmacy - follow up appointment already scheduled in 15 days.

## 2019-07-09 DIAGNOSIS — E669 Obesity, unspecified: Secondary | ICD-10-CM | POA: Diagnosis not present

## 2019-07-10 ENCOUNTER — Telehealth: Payer: Self-pay | Admitting: Student in an Organized Health Care Education/Training Program

## 2019-07-10 ENCOUNTER — Telehealth: Payer: Self-pay

## 2019-07-10 DIAGNOSIS — E669 Obesity, unspecified: Secondary | ICD-10-CM | POA: Diagnosis not present

## 2019-07-10 NOTE — Telephone Encounter (Signed)
Patient is calling and said she would like to know if Dr. Ouida Sills could please place a referral for her to have a diagnostic mammogram at [facility].   The best call back number for patient is [phone] if there are any questions.

## 2019-07-10 NOTE — Telephone Encounter (Signed)
Patient calls nurse line stating her breast are still very tender. Patient stated she doesn't know how she is going to be able to get a mammogram with the burning sensation all over both breast. Patient stated she takes pain medicine for unrelated issue, and the pain meds are not helping her sore breasts. Patient would like to know if there is something she can take for the inflammation, so she can proceed with her mammogram. Please advise.

## 2019-07-11 ENCOUNTER — Ambulatory Visit (INDEPENDENT_AMBULATORY_CARE_PROVIDER_SITE_OTHER): Payer: Medicaid Other | Admitting: Physician Assistant

## 2019-07-11 ENCOUNTER — Encounter: Payer: Self-pay | Admitting: Physician Assistant

## 2019-07-11 VITALS — BP 130/78 | HR 82 | Temp 98.1°F | Resp 16 | Ht 68.0 in | Wt 252.0 lb

## 2019-07-11 DIAGNOSIS — K5900 Constipation, unspecified: Secondary | ICD-10-CM

## 2019-07-11 DIAGNOSIS — K2100 Gastro-esophageal reflux disease with esophagitis, without bleeding: Secondary | ICD-10-CM | POA: Diagnosis not present

## 2019-07-11 DIAGNOSIS — R1314 Dysphagia, pharyngoesophageal phase: Secondary | ICD-10-CM | POA: Diagnosis not present

## 2019-07-11 DIAGNOSIS — Z86010 Personal history of colonic polyps: Secondary | ICD-10-CM

## 2019-07-11 DIAGNOSIS — E669 Obesity, unspecified: Secondary | ICD-10-CM | POA: Diagnosis not present

## 2019-07-11 DIAGNOSIS — R1084 Generalized abdominal pain: Secondary | ICD-10-CM | POA: Diagnosis not present

## 2019-07-11 MED ORDER — DEXLANSOPRAZOLE 60 MG PO CPDR
60.0000 mg | DELAYED_RELEASE_CAPSULE | Freq: Every day | ORAL | 11 refills | Status: DC
Start: 2019-07-11 — End: 2019-07-16

## 2019-07-11 NOTE — Patient Instructions (Addendum)
If you are age 54 or older, your body mass index should be between 23-30. Your Body mass index is 38.32 kg/m. If this is out of the aforementioned range listed, please consider follow up with your Primary Care Provider.  If you are age 26 or younger, your body mass index should be between 19-25. Your Body mass index is 38.32 kg/m. If this is out of the aformentioned range listed, please consider follow up with your Primary Care Provider.   We have sent the following medications to your pharmacy for you to pick up at your convenience: Dexilant 60 mg daily.  Stop Pantoprazole.   You have been scheduled for an endoscopy and colonoscopy. Please follow the written instructions given to you at your visit today. Please pick up your prep supplies at the pharmacy within the next 1-3 days. If you use inhalers (even only as needed), please bring them with you on the day of your procedure.

## 2019-07-11 NOTE — Progress Notes (Signed)
Chief Complaint: Dysphagia and history of polyps  HPI:    [name] is a 54 year old African-American female with past medical history as listed below including COPD and reflux, known to Dr. Hilarie Fredrickson, who was referred to me by Richarda Osmond, DO for a complaint of dysphagia and history of polyps.     06/12/2014 colonoscopy for surveillance due to prior colonic neoplasia and personal history of adenomatous polyps with 4 sessile polyps ranging 3-6 mm in size in the descending and sigmoid colon otherwise normal.  Pathology showed hyperplastic polyp but repeat was recommended in 5 years given history.    03/20/2018 EGD with normal mucosa in the entire esophagus, widely patent and nonobstructing Schatzki's ring dilated to 18 mm, mild gastritis and otherwise normal.    05/11/2019 abdominal ultrasound with no sonographic abnormality to correspond to the area of patient's reported abnormality on the right abdominal wall.  No hernia.    Today, the patient presents to clinic and tells me that she knows that she is due for her colonoscopy due to her history of polyps and is also been having some generalized abdominal pain and feels a knot in her right upper quadrant which has been evaluated by her PCP with imaging as above.  Patient tells me this pain seems to come and go and can be on either side of her abdomen though she thinks it has been there even since time of her last colonoscopy 5 years ago.    Continues to report that she needs to use prune juice on a daily basis in order to maintain normal bowel movements.  She is currently doing this and has no problem with constipation.    Also reports that she is having trouble swallowing again with food and things getting stuck in her throat.  Reports that for 5 to 6 months after last EGD she did not have this problem.  Associated symptoms include a foul-smelling belch "it smells like poop".  As well as some epigastric pain.  Tells me she is using her Pantoprazole  40 mg twice daily (previously Nexium 40 twice daily and Prilosec 40 twice daily), but this medicine is no longer working and she is continuing with some reflux symptoms.    Denies fever, chills, weight loss, anorexia, nausea, vomiting or symptoms that awaken her from sleep.  Past Medical History:  Diagnosis Date  . Allergy   . Anxiety   . Arthritis    knees,hands, hip  pt. Denies  . Cellulitis   . Colon polyps 2013   SESSIL SERRATED ADENOMA (ALL FRAGMENTS)  . Complication of anesthesia    Lowered BP had to keep pt. overnight  . COPD (chronic obstructive pulmonary disease) ([place])   . Depression   . Fibroadenosis breast   . GERD (gastroesophageal reflux disease)    does not take med  . Heart murmur    as child  pt. denies  . Hyperlipidemia    no meds - diet controlled  . Hypothyroidism   . Lymph edema    both legs and feet  . Migraine   . Plantar fasciitis   . Syphilis Treated in early 2012 during hospitalization  . Uterine fibroid   . Varicose veins     Past Surgical History:  Procedure Laterality Date  . COLONOSCOPY  2013  . cyst removed     behind left knee and left cheek  . ENDOMETRIAL ABLATION    . ESOPHAGOGASTRODUODENOSCOPY  2013   normal  . EYE  SURGERY     bilarteral  . HYDRADENITIS EXCISION Right 09/13/2017   Procedure: EXCISION HIDRADENITIS GROIN;  Surgeon: Clovis Riley, MD;  Location: [facility];  Service: General;  Laterality: Right;  LMA  . INCISION AND DRAINAGE ABSCESS / HEMATOMA OF BURSA / KNEE / THIGH     right upper thigh - 2008  . NORPLANT REMOVAL     right upper arm - 12/2001  . svd     x 2  . THYROID SURGERY  2004   removed  . TUBAL LIGATION     2003  . WISDOM TOOTH EXTRACTION      Current Outpatient Medications  Medication Sig Dispense Refill  . albuterol (VENTOLIN HFA) 108 (90 Base) MCG/ACT inhaler INHALE 2 PUFFS INTO THE LUNGS EVERY 4 HOURS AS NEEDED FOR SHORTNESS OF BREATH 8.5 g 1  . bacitracin 500 UNIT/GM ointment Apply to affect areas  4 times a day for two months 453.9 g 0  . clobetasol cream (TEMOVATE) 0.05 % Apply topically 2 (two) times daily. 120 g 3  . conjugated estrogens (PREMARIN) vaginal cream INSERT 1 APPLICATORFUL VAGINALLY DAILY FOR 21 DAYS 30 g 0  . estradiol-norethindrone (ACTIVELLA) 1-0.5 MG tablet Take 1 tablet by mouth daily. 60 tablet 1  . furosemide (LASIX) 40 MG tablet TAKE 1 TABLET(40 MG) BY MOUTH DAILY 90 tablet 3  . hydrOXYzine (ATARAX/VISTARIL) 10 MG tablet Take 1 tablet (10 mg total) by mouth 3 (three) times daily as needed. 90 tablet 0  . levothyroxine (SYNTHROID) 175 MCG tablet Take 1 tablet (175 mcg total) by mouth daily before breakfast. 30 tablet 0  . LYRICA 150 MG capsule Take 1 capsule (150 mg total) by mouth 2 (two) times daily. 180 capsule 1  . olopatadine (PATANOL) 0.1 % ophthalmic solution Place 1 drop into both eyes 2 (two) times daily. 5 mL 0  . oxyCODONE (OXY IR/ROXICODONE) 5 MG immediate release tablet Take 1 tablet (5 mg total) by mouth every 12 (twelve) hours as needed. 60 tablet 0  . pantoprazole (PROTONIX) 40 MG tablet Take 1 tablet (40 mg total) by mouth 2 (two) times daily before a meal. 90 tablet 3  . pravastatin (PRAVACHOL) 20 MG tablet Take 1 tablet (20 mg total) by mouth daily. 90 tablet 3  . QUEtiapine (SEROQUEL) 50 MG tablet TAKE 1 TABLET(50 MG) BY MOUTH AT BEDTIME 90 tablet 0  . SPIRIVA HANDIHALER 18 MCG inhalation capsule Place 1 capsule (18 mcg total) into inhaler and inhale daily as needed. 30 capsule 11   No current facility-administered medications for this visit.    Allergies as of 07/11/2019 - Review Complete 07/03/2019  Allergen Reaction Noted  . Vicodin [hydrocodone-acetaminophen] Itching 02/02/2011    Family History  Problem Relation Age of Onset  . Diabetes Father   . Heart disease Father   . Hyperlipidemia Father   . Hypertension Father   . Diabetes Mother   . Hypertension Mother   . Hyperlipidemia Mother   . Diabetes Paternal Grandmother   . Colon  cancer Neg Hx   . Esophageal cancer Neg Hx   . Rectal cancer Neg Hx   . Stomach cancer Neg Hx     Social History   Socioeconomic History  . Marital status: Divorced    Spouse name: Not on file  . Number of children: 2  . Years of education: Not on file  . Highest education level: Not on file  Occupational History  . Occupation: disabled  Tobacco Use  .  Smoking status: Former Smoker    Packs/day: 0.50    Years: 25.00    Pack years: 12.50    Types: Cigarettes    Quit date: 06/30/2012    Years since quitting: 7.0  . Smokeless tobacco: Never Used  Substance and Sexual Activity  . Alcohol use: Not Currently    Comment: rare  . Drug use: Not Currently    Types: Marijuana    Comment: past hx 29yrs ago  . Sexual activity: Yes    Birth control/protection: Surgical  Other Topics Concern  . Not on file  Social History Narrative   Lives with son in [place].    Social Determinants of Health   Financial Resource Strain:   . Difficulty of Paying Living Expenses:   Food Insecurity:   . Worried About Charity fundraiser in the Last Year:   . Arboriculturist in the Last Year:   Transportation Needs:   . Film/video editor (Medical):   Marland Kitchen Lack of Transportation (Non-Medical):   Physical Activity:   . Days of Exercise per Week:   . Minutes of Exercise per Session:   Stress:   . Feeling of Stress :   Social Connections:   . Frequency of Communication with Friends and Family:   . Frequency of Social Gatherings with Friends and Family:   . Attends Religious Services:   . Active Member of Clubs or Organizations:   . Attends Archivist Meetings:   Marland Kitchen Marital Status:   Intimate Partner Violence:   . Fear of Current or Ex-Partner:   . Emotionally Abused:   Marland Kitchen Physically Abused:   . Sexually Abused:     Review of Systems:    Constitutional: No weight loss, fever or chills Cardiovascular: No chest pain   Respiratory: No SOB  Gastrointestinal: See HPI and  otherwise negative   Physical Exam:  Vital signs: BP 130/78   Pulse 82   Temp 98.1 F (36.7 C)   Resp 16   Ht 5\' 8"  (1.727 m)   Wt 252 lb (114.3 kg)   SpO2 97%   BMI 38.32 kg/m   Constitutional:   Pleasant AA female appears to be in NAD, Well developed, Well nourished, alert and cooperative Respiratory: Respirations even and unlabored. Lungs clear to auscultation bilaterally.   No wheezes, crackles, or rhonchi.  Cardiovascular: Normal S1, S2. No MRG. Regular rate and rhythm. No peripheral edema, cyanosis or pallor.  Gastrointestinal:  Soft, nondistended, mild generalized ttp. No rebound or guarding. Normal bowel sounds. No appreciable masses or hepatomegaly. Rectal:  Not performed.  Psychiatric:Demonstrates good judgement and reason without abnormal affect or behaviors.  RELEVANT LABS AND IMAGING: CBC    Component Value Date/Time   WBC 3.0 (L) 07/05/2019 1546   WBC 3.0 (L) 09/12/2017 1153   RBC 4.43 07/05/2019 1546   RBC 3.98 09/12/2017 1153   HGB 14.0 07/05/2019 1546   HCT 41.4 07/05/2019 1546   PLT 263 07/05/2019 1546   MCV 94 07/05/2019 1546   MCH 31.6 07/05/2019 1546   MCH 32.9 09/12/2017 1153   MCHC 33.8 07/05/2019 1546   MCHC 34.5 09/12/2017 1153   RDW 11.9 07/05/2019 1546   LYMPHSABS 1.5 09/12/2017 1153   MONOABS 0.3 09/12/2017 1153   EOSABS 0.1 09/12/2017 1153   BASOSABS 0.0 09/12/2017 1153    CMP     Component Value Date/Time   NA 140 07/05/2019 1546   K 4.2 07/05/2019 1546   CL 108 (  H) 07/05/2019 1546   CO2 18 (L) 07/05/2019 1546   GLUCOSE 99 07/05/2019 1546   GLUCOSE 122 (H) 09/12/2017 1153   BUN 9 07/05/2019 1546   CREATININE 0.89 07/05/2019 1546   CREATININE 0.92 02/10/2015 1458   CALCIUM 8.3 (L) 07/05/2019 1546   PROT 6.3 07/05/2019 1546   ALBUMIN 3.7 (L) 07/05/2019 1546   AST 13 07/05/2019 1546   ALT 11 07/05/2019 1546   ALKPHOS 87 07/05/2019 1546   BILITOT 0.3 07/05/2019 1546   GFRNONAA 74 07/05/2019 1546   GFRNONAA 73 02/10/2015 1458    GFRAA 85 07/05/2019 1546   GFRAA 85 02/10/2015 1458    Assessment: 1.  History of adenomatous polyps: Last colonoscopy 5 years ago with repeat recommended in 5 years 2.  Dysphagia: History of Schatzki's ring, dilated in the past, likely the same 3.  GERD: Uncontrolled on pantoprazole 40 twice daily; consider PUD+/-H. pylori 4.  Abdominal pain: Ultrasound recently normal, consider relation to constipation?  Versus IBS? 5.  Constipation: Better with prune juice daily  Plan: 1.  Scheduled patient for an EGD with dilation and colonoscopy with Dr. Hilarie Fredrickson in the [facility].  Did discuss risks, benefits, limitations and alternatives and the patient agrees to proceed. 2.  Stop Pantoprazole.  Start Dexilant 60 mg daily.  #30 with 11 refills. 3.  Continue prune juice 4.  Await further recommendations after time of procedures including follow-up appointment scheduling.  Ellouise Newer, PA-C [facility] 07/11/2019, 11:16 AM  Cc: Anderson, Chelsey L, DO

## 2019-07-12 ENCOUNTER — Telehealth: Payer: Self-pay | Admitting: Student in an Organized Health Care Education/Training Program

## 2019-07-12 ENCOUNTER — Telehealth: Payer: Self-pay | Admitting: *Deleted

## 2019-07-12 ENCOUNTER — Telehealth: Payer: Self-pay | Admitting: Internal Medicine

## 2019-07-12 DIAGNOSIS — R1084 Generalized abdominal pain: Secondary | ICD-10-CM

## 2019-07-12 DIAGNOSIS — K5900 Constipation, unspecified: Secondary | ICD-10-CM

## 2019-07-12 DIAGNOSIS — E669 Obesity, unspecified: Secondary | ICD-10-CM | POA: Diagnosis not present

## 2019-07-12 DIAGNOSIS — K2100 Gastro-esophageal reflux disease with esophagitis, without bleeding: Secondary | ICD-10-CM

## 2019-07-12 DIAGNOSIS — R1314 Dysphagia, pharyngoesophageal phase: Secondary | ICD-10-CM

## 2019-07-12 DIAGNOSIS — Z86010 Personal history of colonic polyps: Secondary | ICD-10-CM

## 2019-07-12 MED ORDER — NA SULFATE-K SULFATE-MG SULF 17.5-3.13-1.6 GM/177ML PO SOLN: 1.0000 | Freq: Once | ORAL | 0 refills | Status: AC

## 2019-07-12 NOTE — Telephone Encounter (Signed)
Patient called because Suprep rx not sent to pharmacy,  Patient also having issue with finding care partner, rescheduled for 08/29/2019, Suprep sent to pharmacy New prep instructions sent

## 2019-07-12 NOTE — Telephone Encounter (Signed)
Called patient to discuss breast pain. Her tenderness was getting worse and concerned she couldn't complete her mammogram in 4 days. Patient discontinued estrogen for 2 days now and has not seen any improvement in symptoms. Her oxycodone does not help. Has not tried warm compresses as recommended in our appointment. Has not been wearing a bra as this causes discomfort as well. She denies any redness or warmth to the breasts. Tenderness is bilateral and worse in the mornings.  Breast imaging center requested diagnostic mammogram.  Asked patient to stay off estrogen for now and we can discuss venlafaxine for menopausal symptoms at our appointment next week. Again recommended warm compresses.  Requested that she call Monday to give update on her breast status and we can reschedule or change her mammogram as needed. Since we have good explanation for breast tenderness and low suspicion for malignancy, would continue to keep mammogram as screening for now. If she worsens or does not improve over the weekend with discontinuation of the medication, will change order to diagnostic.

## 2019-07-12 NOTE — Telephone Encounter (Signed)
Patient misunderstood in office visit requirement to have a present care partner. Patient had to reschedule to 6/23 for this reason. Also prep rx had not been sent.   Sent suprep rx to pharmacy on file.

## 2019-07-13 ENCOUNTER — Telehealth: Payer: Self-pay | Admitting: Physician Assistant

## 2019-07-13 ENCOUNTER — Encounter: Payer: Self-pay | Admitting: Internal Medicine

## 2019-07-13 DIAGNOSIS — E669 Obesity, unspecified: Secondary | ICD-10-CM | POA: Diagnosis not present

## 2019-07-13 NOTE — Telephone Encounter (Signed)
Patient called states she received the suprep but not the dexilant

## 2019-07-14 DIAGNOSIS — E669 Obesity, unspecified: Secondary | ICD-10-CM | POA: Diagnosis not present

## 2019-07-15 DIAGNOSIS — E669 Obesity, unspecified: Secondary | ICD-10-CM | POA: Diagnosis not present

## 2019-07-16 ENCOUNTER — Ambulatory Visit: Payer: Medicaid Other

## 2019-07-16 DIAGNOSIS — E669 Obesity, unspecified: Secondary | ICD-10-CM | POA: Diagnosis not present

## 2019-07-16 MED ORDER — DEXLANSOPRAZOLE 60 MG PO CPDR: 60.0000 mg | DELAYED_RELEASE_CAPSULE | Freq: Every day | ORAL | 11 refills | Status: DC

## 2019-07-16 NOTE — Telephone Encounter (Signed)
Sent in script to pharmacy

## 2019-07-17 ENCOUNTER — Encounter: Payer: Medicaid Other | Admitting: Internal Medicine

## 2019-07-17 DIAGNOSIS — E669 Obesity, unspecified: Secondary | ICD-10-CM | POA: Diagnosis not present

## 2019-07-18 DIAGNOSIS — E669 Obesity, unspecified: Secondary | ICD-10-CM | POA: Diagnosis not present

## 2019-07-18 NOTE — Progress Notes (Signed)
Addendum: Reviewed and agree with assessment and management plan. Saory Carriero M, MD  

## 2019-07-19 DIAGNOSIS — E669 Obesity, unspecified: Secondary | ICD-10-CM | POA: Diagnosis not present

## 2019-07-20 ENCOUNTER — Ambulatory Visit (INDEPENDENT_AMBULATORY_CARE_PROVIDER_SITE_OTHER): Payer: Medicaid Other | Admitting: Student in an Organized Health Care Education/Training Program

## 2019-07-20 ENCOUNTER — Other Ambulatory Visit: Payer: Self-pay

## 2019-07-20 ENCOUNTER — Telehealth: Payer: Self-pay

## 2019-07-20 ENCOUNTER — Encounter: Payer: Self-pay | Admitting: Student in an Organized Health Care Education/Training Program

## 2019-07-20 VITALS — BP 125/80 | HR 94 | Ht 68.0 in | Wt 251.4 lb

## 2019-07-20 DIAGNOSIS — N644 Mastodynia: Secondary | ICD-10-CM | POA: Diagnosis not present

## 2019-07-20 DIAGNOSIS — E669 Obesity, unspecified: Secondary | ICD-10-CM | POA: Diagnosis not present

## 2019-07-20 DIAGNOSIS — F32A Depression, unspecified: Secondary | ICD-10-CM

## 2019-07-20 DIAGNOSIS — G8929 Other chronic pain: Secondary | ICD-10-CM

## 2019-07-20 DIAGNOSIS — Z780 Asymptomatic menopausal state: Secondary | ICD-10-CM | POA: Diagnosis not present

## 2019-07-20 DIAGNOSIS — F419 Anxiety disorder, unspecified: Secondary | ICD-10-CM | POA: Diagnosis not present

## 2019-07-20 DIAGNOSIS — F329 Major depressive disorder, single episode, unspecified: Secondary | ICD-10-CM | POA: Diagnosis not present

## 2019-07-20 MED ORDER — OXYCODONE HCL 5 MG PO TABS: 5.0000 mg | ORAL_TABLET | Freq: Two times a day (BID) | ORAL | 0 refills | Status: DC | PRN

## 2019-07-20 MED ORDER — HYDROXYZINE HCL 10 MG PO TABS
10.0000 mg | ORAL_TABLET | Freq: Three times a day (TID) | ORAL | 0 refills | Status: DC | PRN
Start: 2019-07-20 — End: 2019-10-04

## 2019-07-20 MED ORDER — LYRICA 150 MG PO CAPS: 150.0000 mg | ORAL_CAPSULE | Freq: Two times a day (BID) | ORAL | 1 refills | Status: DC

## 2019-07-20 MED ORDER — VENLAFAXINE HCL ER 37.5 MG PO CP24: 37.5000 mg | ORAL_CAPSULE | Freq: Every day | ORAL | 0 refills | Status: DC

## 2019-07-20 NOTE — Patient Instructions (Signed)
It was a pleasure to see you today!  To summarize our discussion for this visit:  For your menopausal symptoms, we are discontinuing the estrogen replacement and will try venlafaxine.   You can use atarax as needed for agitation.   Please come back in 4 weeks for follow up  We will contact you with MRI schedule  Some additional health maintenance measures we should update are: Health Maintenance Due  Topic Date Due  . COLONOSCOPY  06/12/2019  .    Call the clinic at [phone] if your symptoms worsen or you have any concerns.   Thank you for allowing me to take part in your care,  Dr. Doristine Mango  If you are feeling suicidal or depression symptoms worsen please immediately go to:   24 Hour Availability [facility]  [address] [place], [place] [address]  [phone] or [phone]  . If you are thinking about harming yourself or having thoughts of suicide, or if you know someone who is, seek help right away. . Call your doctor or mental health care provider. . Call 911 or go to a hospital emergency room to get immediate help, or ask a friend or family member to help you do these things. . Call the [place] National Suicide Prevention Lifeline's toll-free, 24-hour hotline at 1-800-273-TALK 254 012 2184) or TTY: 1-800-799-4 TTY 279-475-1378) to talk to a trained counselor. . If you are in crisis, make sure you are not left alone.  . If someone else is in crisis, make sure he or she is not left alone   [facility] the [employer] (Domestic Violence, Rape & Victim Assistance [phone]  [facility]  [address], [place]  [address]               [phone] or [phone]  [place]    (ONLY from 8am-4pm)    [phone]  [facility] (24/7)   [phone]  [place] National Suicide Hotline   [phone] Diamantina Monks)

## 2019-07-20 NOTE — Telephone Encounter (Signed)
Prior approval for Lyrica completed via [place] [employer]. Prior approval [id number].  [place] [facility] pharmacy informed.

## 2019-07-20 NOTE — Progress Notes (Signed)
    SUBJECTIVE:   CHIEF COMPLAINT / HPI: continued breast pain  Patient on hormone replacement therapy for menopausal symptoms but unable to tolerate. Biggest concerns are for mood lability and bilateral breast tenderness. Tenderness is worse on upper outer quadrants and R worse than left. Denies any skin changes or nipple change/drainage. No weight loss or fevers. Has been off of the hormone therapy for a couple weeks now and has seen some improvement recently. Still having to wear sports bras due to discomfort. Unable to get mammogram due to pain.  Chronic pain- well controlled on current long-term oxycodone.  Requesting refill.   Anxiety- patient was prescribed benzo by previous provider for monthly anxiety in relation to her mother's passing which we discontinued last year. She is asking to restart this medication for as needed anxiety.   OBJECTIVE:   BP 125/80   Pulse 94   Ht 5\' 8"  (1.727 m)   Wt 251 lb 6.4 oz (114 kg)   SpO2 99%   BMI 38.23 kg/m   General: NAD, pleasant, able to participate in exam Breast: bilateral breasts with normal fibrous tissue. Tenderness in upper right quadrant bilaterally without redness or skin abnormalities. No expressed discharge from nipples. No obvious lymphadenopathy.  Extremities: chronic LE edema. WWP. Skin: warm and dry, no rashes noted Neuro: alert and oriented x4, no focal deficits Psych: Normal affect and mood  ASSESSMENT/PLAN:   Breast tenderness in female We have stopped hormone replacements.  - will start venlafaxine for menopausal symptoms instead. Can also suggest soy products - breast MRI, diagnostic ordered. Unable to tolerate traditional mammogram.   Anxiety and depression - continue venlafaxine - trial of atarax for acute anxiety episodes  Menopause - venlafaxine trial  Chronic pain Refilled lyrica and oxycodone Started venlafaxine      Richarda Osmond, [place]

## 2019-07-21 DIAGNOSIS — E669 Obesity, unspecified: Secondary | ICD-10-CM | POA: Diagnosis not present

## 2019-07-22 DIAGNOSIS — E669 Obesity, unspecified: Secondary | ICD-10-CM | POA: Diagnosis not present

## 2019-07-23 DIAGNOSIS — E669 Obesity, unspecified: Secondary | ICD-10-CM | POA: Diagnosis not present

## 2019-07-24 ENCOUNTER — Telehealth: Payer: Self-pay | Admitting: Student in an Organized Health Care Education/Training Program

## 2019-07-24 DIAGNOSIS — N644 Mastodynia: Secondary | ICD-10-CM

## 2019-07-24 DIAGNOSIS — E669 Obesity, unspecified: Secondary | ICD-10-CM | POA: Diagnosis not present

## 2019-07-24 NOTE — Telephone Encounter (Signed)
Patient is calling and would like Dr. Ouida Sills to call her concerning the continued issue with tenderness in her breast. She said she still does not know when her ultrasound is and is having a lot of problems.   I offered her an appointment but she wants to only see Dr. Ouida Sills and she doesn't have anything available soon enough.   Please call pt to discuss. [phone]

## 2019-07-25 ENCOUNTER — Other Ambulatory Visit: Payer: Self-pay | Admitting: Student in an Organized Health Care Education/Training Program

## 2019-07-25 ENCOUNTER — Other Ambulatory Visit: Payer: Self-pay | Admitting: Family Medicine

## 2019-07-25 DIAGNOSIS — G8929 Other chronic pain: Secondary | ICD-10-CM | POA: Insufficient documentation

## 2019-07-25 DIAGNOSIS — N644 Mastodynia: Secondary | ICD-10-CM

## 2019-07-25 DIAGNOSIS — E669 Obesity, unspecified: Secondary | ICD-10-CM | POA: Diagnosis not present

## 2019-07-25 NOTE — Telephone Encounter (Signed)
[facility] to cancel diagnostic mammogram.  Was told by Dub Mikes (scheduler)  that because of their protocol, the diagnostic mammogram order would need to stay in the computer.  Radiologist will be called when patient refuses mammogram to speak to her.  Joaquim Lai states that patient should tell technician and Radiologist at appointment for bilateral ultrasound that she can not tolerate mammogram because of tenderness in breast.  Called patient and confirmed that she knew date and time of appointment and the verbiage to use.  08/03/2019 at [place], [place]  .Ozella Almond, CMA

## 2019-07-25 NOTE — Telephone Encounter (Signed)
Please let patient know that I am working nights so can call her at odd times only. I have ordered an MRI of her breasts. Can you please help in scheduling this as well? Thank you.

## 2019-07-25 NOTE — Assessment & Plan Note (Signed)
We have stopped hormone replacements.  - will start venlafaxine for menopausal symptoms instead. Can also suggest soy products - breast MRI, diagnostic ordered. Unable to tolerate traditional mammogram.

## 2019-07-25 NOTE — Assessment & Plan Note (Signed)
-   continue venlafaxine - trial of atarax for acute anxiety episodes

## 2019-07-25 NOTE — Telephone Encounter (Signed)
Thanks team

## 2019-07-25 NOTE — Assessment & Plan Note (Signed)
Refilled lyrica and oxycodone Started venlafaxine

## 2019-07-25 NOTE — Telephone Encounter (Signed)
Attempted to schedule MRI of [place] and was instructed that patient has to have a mammogram within the last year on file.  Patient cancelled last appointment for mammogram 07/16/2019 because of bilateral breast tenderness.  Rescheduled diagnostic mammogram and ultrasound if needed.  Called patient with information and patient states that she can not tolerate a mammogram because she feels that the breast are getting worse.  Now there is pain, swelling and burning. Patient has stopped taking estrogen which she felt was the problem.  Pain medication are not helping at all and patient wants to know if she can bypass a mammogram or be "numbed?"  Discussed with Dr. Andria Frames and he states that he will call patient.  Ozella Almond, [place]

## 2019-07-25 NOTE — Telephone Encounter (Signed)
Called patient and long discussion.  Bilateral breast tenderness.  No longer taking estrogens.  Not improving.  States cannot tolerate mammogram due to pain.    Bilateral pain and reassuring mammogram 14 months ago makes cancer very unlikely.  Did have fibroglandular tissue on last mammogram.  We agreed that even though it would be uncomfortable, she will get an ultrasound.  In my diff is cystic breast disease and I am looking for drainable cysts

## 2019-07-25 NOTE — Telephone Encounter (Signed)
Noted and agree. 

## 2019-07-25 NOTE — Assessment & Plan Note (Signed)
-   venlafaxine trial

## 2019-07-26 ENCOUNTER — Other Ambulatory Visit: Payer: Self-pay | Admitting: Family Medicine

## 2019-07-26 DIAGNOSIS — E669 Obesity, unspecified: Secondary | ICD-10-CM | POA: Diagnosis not present

## 2019-07-27 DIAGNOSIS — E669 Obesity, unspecified: Secondary | ICD-10-CM | POA: Diagnosis not present

## 2019-07-28 DIAGNOSIS — E669 Obesity, unspecified: Secondary | ICD-10-CM | POA: Diagnosis not present

## 2019-07-29 DIAGNOSIS — E669 Obesity, unspecified: Secondary | ICD-10-CM | POA: Diagnosis not present

## 2019-07-30 DIAGNOSIS — E669 Obesity, unspecified: Secondary | ICD-10-CM | POA: Diagnosis not present

## 2019-07-31 ENCOUNTER — Telehealth: Payer: Self-pay | Admitting: Student in an Organized Health Care Education/Training Program

## 2019-07-31 DIAGNOSIS — E669 Obesity, unspecified: Secondary | ICD-10-CM | POA: Diagnosis not present

## 2019-07-31 NOTE — Telephone Encounter (Signed)
Called [facility]. At total of 35 minutes spent coordinating care  Called and spoke to Mammogram and Ultrasound tech as well as radiologist (Dr. Rosana Hoes). After length conversation, Dr. Rosana Hoes will call patient later today and discuss next steps and protocols.  Dorris Singh, MD  Family Medicine Teaching Service

## 2019-07-31 NOTE — Telephone Encounter (Signed)
Pt is calling because she is schedule to have an US done at the breast center on Friday 08/03/19. The breast center told her that she  will also have to have a mammogram. Pt said that she was told by Dr. Andria Frames that she didn't need a mammogram and not to get one. Pt is confused now on what she is to do. The breast center is asking for the doctor to call them and explain that. Please also inform the patient. jw

## 2019-07-31 NOTE — Telephone Encounter (Signed)
Called patient and informed her that Dr. Owens Shark has agreed to call the [facility] concerning mammogram.  .[name], [name]

## 2019-08-01 DIAGNOSIS — E669 Obesity, unspecified: Secondary | ICD-10-CM | POA: Diagnosis not present

## 2019-08-02 ENCOUNTER — Telehealth: Payer: Self-pay

## 2019-08-02 DIAGNOSIS — E669 Obesity, unspecified: Secondary | ICD-10-CM | POA: Diagnosis not present

## 2019-08-02 NOTE — Telephone Encounter (Signed)
Completed PA info in [place] for Oxycondone. Status pending. Will recheck status in 24 hours.

## 2019-08-03 ENCOUNTER — Other Ambulatory Visit: Payer: Self-pay

## 2019-08-03 ENCOUNTER — Ambulatory Visit: Admission: RE | Admit: 2019-08-03 | Payer: Medicaid Other | Source: Ambulatory Visit

## 2019-08-03 ENCOUNTER — Ambulatory Visit
Admission: RE | Admit: 2019-08-03 | Discharge: 2019-08-03 | Disposition: A | Discharge status: Home / Self Care | Payer: Medicaid Other | Source: Ambulatory Visit | Attending: Family Medicine | Admitting: Family Medicine

## 2019-08-03 DIAGNOSIS — N644 Mastodynia: Secondary | ICD-10-CM

## 2019-08-03 DIAGNOSIS — E669 Obesity, unspecified: Secondary | ICD-10-CM | POA: Diagnosis not present

## 2019-08-03 DIAGNOSIS — R928 Other abnormal and inconclusive findings on diagnostic imaging of breast: Secondary | ICD-10-CM | POA: Diagnosis not present

## 2019-08-03 NOTE — Telephone Encounter (Signed)
Prior approval for Oxycodone completed via [place] [employer]. Med approved for 08/02/2019-01/29/2020. Prior approval [id number].[facility] pharmacy informed.

## 2019-08-04 DIAGNOSIS — E669 Obesity, unspecified: Secondary | ICD-10-CM | POA: Diagnosis not present

## 2019-08-05 DIAGNOSIS — E669 Obesity, unspecified: Secondary | ICD-10-CM | POA: Diagnosis not present

## 2019-08-06 DIAGNOSIS — E669 Obesity, unspecified: Secondary | ICD-10-CM | POA: Diagnosis not present

## 2019-08-07 DIAGNOSIS — E669 Obesity, unspecified: Secondary | ICD-10-CM | POA: Diagnosis not present

## 2019-08-08 DIAGNOSIS — E669 Obesity, unspecified: Secondary | ICD-10-CM | POA: Diagnosis not present

## 2019-08-09 DIAGNOSIS — E669 Obesity, unspecified: Secondary | ICD-10-CM | POA: Diagnosis not present

## 2019-08-10 DIAGNOSIS — E669 Obesity, unspecified: Secondary | ICD-10-CM | POA: Diagnosis not present

## 2019-08-11 DIAGNOSIS — E669 Obesity, unspecified: Secondary | ICD-10-CM | POA: Diagnosis not present

## 2019-08-12 DIAGNOSIS — E669 Obesity, unspecified: Secondary | ICD-10-CM | POA: Diagnosis not present

## 2019-08-13 DIAGNOSIS — E669 Obesity, unspecified: Secondary | ICD-10-CM | POA: Diagnosis not present

## 2019-08-14 DIAGNOSIS — E669 Obesity, unspecified: Secondary | ICD-10-CM | POA: Diagnosis not present

## 2019-08-15 ENCOUNTER — Other Ambulatory Visit: Payer: Self-pay | Admitting: Student in an Organized Health Care Education/Training Program

## 2019-08-15 DIAGNOSIS — E669 Obesity, unspecified: Secondary | ICD-10-CM | POA: Diagnosis not present

## 2019-08-16 DIAGNOSIS — E669 Obesity, unspecified: Secondary | ICD-10-CM | POA: Diagnosis not present

## 2019-08-17 DIAGNOSIS — E669 Obesity, unspecified: Secondary | ICD-10-CM | POA: Diagnosis not present

## 2019-08-18 DIAGNOSIS — E669 Obesity, unspecified: Secondary | ICD-10-CM | POA: Diagnosis not present

## 2019-08-19 DIAGNOSIS — E669 Obesity, unspecified: Secondary | ICD-10-CM | POA: Diagnosis not present

## 2019-08-20 DIAGNOSIS — E669 Obesity, unspecified: Secondary | ICD-10-CM | POA: Diagnosis not present

## 2019-08-21 DIAGNOSIS — E669 Obesity, unspecified: Secondary | ICD-10-CM | POA: Diagnosis not present

## 2019-08-22 DIAGNOSIS — E669 Obesity, unspecified: Secondary | ICD-10-CM | POA: Diagnosis not present

## 2019-08-23 ENCOUNTER — Other Ambulatory Visit: Payer: Self-pay

## 2019-08-23 ENCOUNTER — Encounter: Payer: Self-pay | Admitting: Student in an Organized Health Care Education/Training Program

## 2019-08-23 ENCOUNTER — Ambulatory Visit (INDEPENDENT_AMBULATORY_CARE_PROVIDER_SITE_OTHER): Payer: Medicaid Other | Admitting: Student in an Organized Health Care Education/Training Program

## 2019-08-23 DIAGNOSIS — I890 Lymphedema, not elsewhere classified: Secondary | ICD-10-CM

## 2019-08-23 DIAGNOSIS — E669 Obesity, unspecified: Secondary | ICD-10-CM | POA: Diagnosis not present

## 2019-08-23 DIAGNOSIS — Z780 Asymptomatic menopausal state: Secondary | ICD-10-CM | POA: Diagnosis not present

## 2019-08-23 MED ORDER — SCD SOFT SLEEVES/THIGH LENGTH MISC: 1.0000 | Freq: Every evening | 0 refills | Status: DC

## 2019-08-23 MED ORDER — OXYCODONE HCL 5 MG PO TABS: 5.0000 mg | ORAL_TABLET | Freq: Two times a day (BID) | ORAL | 0 refills | Status: DC | PRN

## 2019-08-23 MED ORDER — VENLAFAXINE HCL ER 37.5 MG PO CP24: 37.5000 mg | ORAL_CAPSULE | Freq: Every day | ORAL | 1 refills | Status: DC

## 2019-08-23 NOTE — Patient Instructions (Signed)
It was a pleasure to see you today!  To summarize our discussion for this visit:  I recommend getting some good compression stockings and put them on within 30 minutes of getting up in the morning. I have prescribed a device and we have to see what that is and how your insurance company will do with coverage.  i'm glad to hear your breast pain is improved. I will refill your medications.   Please follow up with your GI doctor office to complete that colonoscopy!  Some additional health maintenance measures we should update are: Health Maintenance Due  Topic Date Due  . COLONOSCOPY  06/12/2019  .   Please return to our clinic to see me in about 2 months or sooner if needed.  Call the clinic at [phone] if your symptoms worsen or you have any concerns.   Thank you for allowing me to take part in your care,  Dr. Doristine Mango

## 2019-08-23 NOTE — Assessment & Plan Note (Signed)
Tolerating venlafaxine well. Symptoms improved. Biggest current concern is continued mood swings. No breast pain. Continue venlafaxine

## 2019-08-23 NOTE — Assessment & Plan Note (Signed)
Discussed diet and increasing activity level

## 2019-08-23 NOTE — Progress Notes (Signed)
    SUBJECTIVE:   CHIEF COMPLAINT / HPI: check up  Lymphedema- chronic. Today is a bad day but not out of normal variance. Has not been wearing compression stockings.   Breast pain- resolved. Tolerating venlafaxine well. Relieved to see the normal breast cancer screening.  Weight loss- patient has been drinking prune juice several times per day to lose weight.  OBJECTIVE:   BP 120/80   Pulse 70   Wt 245 lb 6.4 oz (111.3 kg)   SpO2 98%   BMI 37.31 kg/m   General: NAD, pleasant, able to participate in exam Cardiac: RRR, normal heart sounds, no murmurs. 2+ radial and PT pulses bilaterally Respiratory: CTAB, normal effort, No wheezes, rales or rhonchi Abdomen: soft, nontender, nondistended, no hepatic or splenomegaly, +BS Extremities: chronic, stable LE edema. Non-pitting. Skin: warm and dry, no rashes noted Neuro: alert and oriented x4, no focal deficits Psych: Normal affect and mood  ASSESSMENT/PLAN:   Lymphedema Prescribed LE compression devices.  Recommended patient wear compression stockings shortly after waking up. Recommended increasing exercise tolerance Continue to elevate legs while resting  Menopause Tolerating venlafaxine well. Symptoms improved. Biggest current concern is continued mood swings. No breast pain. Continue venlafaxine  Obesity (BMI 30-39.9) Discussed diet and increasing activity level     [place]

## 2019-08-23 NOTE — Assessment & Plan Note (Signed)
Prescribed LE compression devices.  Recommended patient wear compression stockings shortly after waking up. Recommended increasing exercise tolerance Continue to elevate legs while resting

## 2019-08-24 ENCOUNTER — Other Ambulatory Visit: Payer: Self-pay | Admitting: Student in an Organized Health Care Education/Training Program

## 2019-08-24 DIAGNOSIS — E669 Obesity, unspecified: Secondary | ICD-10-CM | POA: Diagnosis not present

## 2019-08-25 DIAGNOSIS — E669 Obesity, unspecified: Secondary | ICD-10-CM | POA: Diagnosis not present

## 2019-08-26 DIAGNOSIS — E669 Obesity, unspecified: Secondary | ICD-10-CM | POA: Diagnosis not present

## 2019-08-27 DIAGNOSIS — E669 Obesity, unspecified: Secondary | ICD-10-CM | POA: Diagnosis not present

## 2019-08-28 ENCOUNTER — Telehealth: Payer: Self-pay | Admitting: Internal Medicine

## 2019-08-28 DIAGNOSIS — E669 Obesity, unspecified: Secondary | ICD-10-CM | POA: Diagnosis not present

## 2019-08-28 NOTE — Telephone Encounter (Signed)
Pt had questions regarding her prep. Just reviewed her instructions with her again and confirmed her arrival and NPO time. She verbalized understanding.

## 2019-08-29 ENCOUNTER — Ambulatory Visit (AMBULATORY_SURGERY_CENTER): Payer: Medicaid Other | Admitting: Internal Medicine

## 2019-08-29 ENCOUNTER — Encounter: Payer: Self-pay | Admitting: Internal Medicine

## 2019-08-29 ENCOUNTER — Other Ambulatory Visit: Payer: Self-pay

## 2019-08-29 VITALS — BP 120/91 | HR 56 | Temp 97.8°F | Resp 13 | Ht 68.0 in | Wt 252.0 lb

## 2019-08-29 DIAGNOSIS — E669 Obesity, unspecified: Secondary | ICD-10-CM | POA: Diagnosis not present

## 2019-08-29 DIAGNOSIS — K222 Esophageal obstruction: Secondary | ICD-10-CM

## 2019-08-29 DIAGNOSIS — D123 Benign neoplasm of transverse colon: Secondary | ICD-10-CM | POA: Diagnosis not present

## 2019-08-29 DIAGNOSIS — R1013 Epigastric pain: Secondary | ICD-10-CM

## 2019-08-29 DIAGNOSIS — K635 Polyp of colon: Secondary | ICD-10-CM

## 2019-08-29 DIAGNOSIS — K2950 Unspecified chronic gastritis without bleeding: Secondary | ICD-10-CM | POA: Diagnosis not present

## 2019-08-29 DIAGNOSIS — D124 Benign neoplasm of descending colon: Secondary | ICD-10-CM

## 2019-08-29 DIAGNOSIS — R109 Unspecified abdominal pain: Secondary | ICD-10-CM

## 2019-08-29 DIAGNOSIS — K2970 Gastritis, unspecified, without bleeding: Secondary | ICD-10-CM | POA: Diagnosis not present

## 2019-08-29 DIAGNOSIS — R1310 Dysphagia, unspecified: Secondary | ICD-10-CM | POA: Diagnosis not present

## 2019-08-29 DIAGNOSIS — R1319 Other dysphagia: Secondary | ICD-10-CM

## 2019-08-29 DIAGNOSIS — Z86010 Personal history of colonic polyps: Secondary | ICD-10-CM

## 2019-08-29 MED ORDER — SODIUM CHLORIDE 0.9 % IV SOLN
500.0000 mL | Freq: Once | INTRAVENOUS | Status: DC
Start: 2019-08-29 — End: 2019-08-29

## 2019-08-29 NOTE — Patient Instructions (Signed)
Thank you for allowing [place] to care for you today!  Await pathology results, approximately 7-10 days.  Will make recommendations at that time for future colonoscopy.  Resume previous diet and medications today.  Return to your normal activities tomorrow.     YOU HAD AN ENDOSCOPIC PROCEDURE TODAY AT [facility]:   Refer to the procedure report that was given to you for any specific questions about what was found during the examination.  If the procedure report does not answer your questions, please call your gastroenterologist to clarify.  If you requested that your care partner not be given the details of your procedure findings, then the procedure report has been included in a sealed envelope for you to review at your convenience later.  YOU SHOULD EXPECT: Some feelings of bloating in the abdomen. Passage of more gas than usual.  Walking can help get rid of the air that was put into your GI tract during the procedure and reduce the bloating. If you had a lower endoscopy (such as a colonoscopy or flexible sigmoidoscopy) you may notice spotting of blood in your stool or on the toilet paper. If you underwent a bowel prep for your procedure, you may not have a normal bowel movement for a few days.  Please Note:  You might notice some irritation and congestion in your nose or some drainage.  This is from the oxygen used during your procedure.  There is no need for concern and it should clear up in a day or so.  SYMPTOMS TO REPORT IMMEDIATELY:   Following lower endoscopy (colonoscopy or flexible sigmoidoscopy):  Excessive amounts of blood in the stool  Significant tenderness or worsening of abdominal pains  Swelling of the abdomen that is new, acute  Fever of 100F or higher   Following upper endoscopy (EGD)  Vomiting of blood or coffee ground material  New chest pain or pain under the shoulder blades  Painful or persistently difficult swallowing  New shortness of  breath  Fever of 100F or higher  Black, tarry-looking stools  For urgent or emergent issues, a gastroenterologist can be reached at any hour by calling [phone]. Do not use MyChart messaging for urgent concerns.    DIET:  We do recommend a small meal at first, but then you may proceed to your regular diet.  Drink plenty of fluids but you should avoid alcoholic beverages for 24 hours.  ACTIVITY:  You should plan to take it easy for the rest of today and you should NOT DRIVE or use heavy machinery until tomorrow (because of the sedation medicines used during the test).    FOLLOW UP: Our staff will call the number listed on your records 48-72 hours following your procedure to check on you and address any questions or concerns that you may have regarding the information given to you following your procedure. If we do not reach you, we will leave a message.  We will attempt to reach you two times.  During this call, we will ask if you have developed any symptoms of COVID 19. If you develop any symptoms (ie: fever, flu-like symptoms, shortness of breath, cough etc.) before then, please call [phone].  If you test positive for Covid 19 in the 2 weeks post procedure, please call and report this information to [place].    If any biopsies were taken you will be contacted by phone or by letter within the next 1-3 weeks.  Please call us at [phone]  if you have not heard about the biopsies in 3 weeks.    SIGNATURES/CONFIDENTIALITY: You and/or your care partner have signed paperwork which will be entered into your electronic medical record.  These signatures attest to the fact that that the information above on your After Visit Summary has been reviewed and is understood.  Full responsibility of the confidentiality of this discharge information lies with you and/or your care-partner.

## 2019-08-29 NOTE — Progress Notes (Signed)
PT taken to PACU. Monitors in place. VSS. Report given to RN. 

## 2019-08-29 NOTE — Op Note (Signed)
[facility] Patient Name: [name] Procedure Date: 08/29/2019 9:32 AM MRN: [id number] Endoscopist: Jerene Bears , MD Age: 54 Referring MD:  Date of Birth: February 21, 1966 Gender: Female Account #: [id number] Procedure:                Upper GI endoscopy Indications:              Epigastric abdominal pain, Dysphagia responsive to                            previous dilation (last EGD Jan 2020 dilation with                            TTS balloon to 18 mm). Medicines:                Propofol per Anesthesia Procedure:                Pre-Anesthesia Assessment:                           - Prior to the procedure, a History and Physical                            was performed, and patient medications and                            allergies were reviewed. The patient's tolerance of                            previous anesthesia was also reviewed. The risks                            and benefits of the procedure and the sedation                            options and risks were discussed with the patient.                            All questions were answered, and informed consent                            was obtained. Prior Anticoagulants: The patient has                            taken no previous anticoagulant or antiplatelet                            agents. ASA Grade Assessment: III - A patient with                            severe systemic disease. After reviewing the risks                            and benefits, the patient was deemed in  satisfactory condition to undergo the procedure.                           After obtaining informed consent, the endoscope was                            passed under direct vision. Throughout the                            procedure, the patient's blood pressure, pulse, and                            oxygen saturations were monitored continuously. The                            Endoscope was introduced through  the mouth, and                            advanced to the second part of duodenum. The upper                            GI endoscopy was accomplished without difficulty.                            The patient tolerated the procedure well. Scope In: Scope Out: Findings:                 Normal mucosa was found in the entire esophagus.                           A non-obstructing Schatzki ring was found at the                            gastroesophageal junction (36 cm from incisors).                            The scope was withdrawn. Dilation was performed                            with a Maloney dilator with mild resistance at 21                            Fr.                           The cardia and gastric fundus were normal on                            retroflexion.                           The entire examined stomach was normal. Biopsies                            were taken with a cold forceps from gastric  body,                            antrum, and incisura for histology and Helicobacter                            pylori testing given epigastric pain.                           The examined duodenum was normal. Complications:            No immediate complications. Estimated Blood Loss:     Estimated blood loss was minimal. Impression:               - Normal mucosa was found in the entire esophagus.                           - Non-obstructing Schatzki ring. Dilated with Carrizo.                           - Normal stomach. Biopsied.                           - Normal examined duodenum. Recommendation:           - Patient has a contact number available for                            emergencies. The signs and symptoms of potential                            delayed complications were discussed with the                            patient. Return to normal activities tomorrow.                            Written discharge instructions were provided to the                             patient.                           - Resume previous diet.                           - Continue present medications.                           - Await pathology results. Jerene Bears, MD 08/29/2019 10:08:31 AM This report has been signed electronically.

## 2019-08-29 NOTE — Progress Notes (Signed)
Called to room to assist during endoscopic procedure.  Patient ID and intended procedure confirmed with present staff. Received instructions for my participation in the procedure from the performing physician.  

## 2019-08-29 NOTE — Op Note (Signed)
[facility] Patient Name: Archie Atilano Procedure Date: 08/29/2019 9:30 AM MRN: [id number] Endoscopist: Jerene Bears , MD Age: 54 Referring MD:  Date of Birth: 05/14/1965 Gender: Female Account #: [id number] Procedure:                Colonoscopy Indications:              High risk colon cancer surveillance: Personal                            history of colonic polyps, Last colonoscopy: April                            2016                           SSP in 2013, hyperplastic polyps only in 2016 Medicines:                Monitored Anesthesia Care Procedure:                Pre-Anesthesia Assessment:                           - Prior to the procedure, a History and Physical                            was performed, and patient medications and                            allergies were reviewed. The patient's tolerance of                            previous anesthesia was also reviewed. The risks                            and benefits of the procedure and the sedation                            options and risks were discussed with the patient.                            All questions were answered, and informed consent                            was obtained. Prior Anticoagulants: The patient has                            taken no previous anticoagulant or antiplatelet                            agents. ASA Grade Assessment: III - A patient with                            severe systemic disease. After reviewing the risks  and benefits, the patient was deemed in                            satisfactory condition to undergo the procedure.                           After obtaining informed consent, the colonoscope                            was passed under direct vision. Throughout the                            procedure, the patient's blood pressure, pulse, and                            oxygen saturations were monitored continuously. The                             Colonoscope was introduced through the anus and                            advanced to the cecum, identified by appendiceal                            orifice and ileocecal valve. The colonoscopy was                            performed without difficulty. The patient tolerated                            the procedure well. The quality of the bowel                            preparation was excellent. The ileocecal valve,                            appendiceal orifice, and rectum were photographed. Scope In: 9:47:31 AM Scope Out: 10:02:58 AM Scope Withdrawal Time: 0 hours 13 minutes 23 seconds  Total Procedure Duration: 0 hours 15 minutes 27 seconds  Findings:                 The digital rectal exam was normal.                           Two sessile polyps were found in the transverse                            colon. The polyps were 4 to 5 mm in size. These                            polyps were removed with a cold snare. Resection                            and retrieval were complete.  Two sessile polyps were found in the descending                            colon. The polyps were 4 to 5 mm in size. These                            polyps were removed with a cold snare. Resection                            and retrieval were complete.                           Internal hemorrhoids were found during                            retroflexion. The hemorrhoids were small. Complications:            No immediate complications. Estimated Blood Loss:     Estimated blood loss was minimal. Impression:               - Two 4 to 5 mm polyps in the transverse colon,                            removed with a cold snare. Resected and retrieved.                           - Two 4 to 5 mm polyps in the descending colon,                            removed with a cold snare. Resected and retrieved.                           - Small internal hemorrhoids. Recommendation:            - Patient has a contact number available for                            emergencies. The signs and symptoms of potential                            delayed complications were discussed with the                            patient. Return to normal activities tomorrow.                            Written discharge instructions were provided to the                            patient.                           - Resume previous diet.                           -  Continue present medications.                           - Await pathology results.                           - Repeat colonoscopy is recommended for                            surveillance. The colonoscopy date will be                            determined after pathology results from today's                            exam become available for review. Jerene Bears, MD 08/29/2019 10:11:03 AM This report has been signed electronically.

## 2019-08-30 DIAGNOSIS — E669 Obesity, unspecified: Secondary | ICD-10-CM | POA: Diagnosis not present

## 2019-08-31 ENCOUNTER — Encounter: Payer: Self-pay | Admitting: Internal Medicine

## 2019-08-31 ENCOUNTER — Telehealth: Payer: Self-pay | Admitting: *Deleted

## 2019-08-31 DIAGNOSIS — E669 Obesity, unspecified: Secondary | ICD-10-CM | POA: Diagnosis not present

## 2019-08-31 NOTE — Telephone Encounter (Signed)
°  Follow up Call-  Call back number 08/29/2019 03/20/2018  Post procedure Call Back phone  # [phone]  Permission to leave phone message Yes Yes  Some recent data might be hidden     Patient questions:  Do you have a fever, pain , or abdominal swelling? No. Pain Score  0 *  Have you tolerated food without any problems? Yes.    Have you been able to return to your normal activities? Yes.    Do you have any questions about your discharge instructions: Diet   No. Medications  No. Follow up visit  No.  Do you have questions or concerns about your Care? No.  Actions: * If pain score is 4 or above: No action needed, pain <4.  1. Have you developed a fever since your procedure? no  2.   Have you had an respiratory symptoms (SOB or cough) since your procedure? no  3.   Have you tested positive for COVID 19 since your procedure no  4.   Have you had any family members/close contacts diagnosed with the COVID 19 since your procedure?  no   If yes to any of these questions please route to Joylene John, RN and Erenest Rasher, RN

## 2019-09-01 DIAGNOSIS — E669 Obesity, unspecified: Secondary | ICD-10-CM | POA: Diagnosis not present

## 2019-09-02 DIAGNOSIS — E669 Obesity, unspecified: Secondary | ICD-10-CM | POA: Diagnosis not present

## 2019-09-03 ENCOUNTER — Other Ambulatory Visit: Payer: Self-pay | Admitting: Student in an Organized Health Care Education/Training Program

## 2019-09-03 ENCOUNTER — Telehealth: Payer: Self-pay

## 2019-09-03 DIAGNOSIS — E669 Obesity, unspecified: Secondary | ICD-10-CM | POA: Diagnosis not present

## 2019-09-03 MED ORDER — OXYCODONE HCL 5 MG PO TABS: 5.0000 mg | ORAL_TABLET | Freq: Two times a day (BID) | ORAL | 0 refills | Status: DC | PRN

## 2019-09-03 NOTE — Telephone Encounter (Signed)
Patient calls nurse line reporting she can not pick up 6/28 oxycodone prescription due to how it was written. I called the pharmacy and per pharmacist, the prescription was written with a note stating, "next refill not before 10/21/2019." The pharmacist stated "although I believe I know what the provider is trying to do, I would like a new prescription sent over without the note not refilling before August." Please send in the Oxycodone prescription for July on its own. If you would like an additional one for August, that will have to be written separately, per pharmacy. Please advise.

## 2019-09-04 DIAGNOSIS — E669 Obesity, unspecified: Secondary | ICD-10-CM | POA: Diagnosis not present

## 2019-09-04 MED ORDER — OXYCODONE HCL 5 MG PO TABS: 5.0000 mg | ORAL_TABLET | Freq: Two times a day (BID) | ORAL | 0 refills | Status: DC | PRN

## 2019-09-04 NOTE — Addendum Note (Signed)
Addended by: Richarda Osmond on: 09/04/2019 12:59 PM   Modules accepted: Orders

## 2019-09-04 NOTE — Telephone Encounter (Signed)
Patient calling nurse line regarding status of oxycodone rx being resent to pharmacy.   Talbot Grumbling, RN

## 2019-09-04 NOTE — Telephone Encounter (Signed)
Called and spoke with pharmacist. Verified that rx went through correctly and that patient has picked up medication today.   Talbot Grumbling, RN

## 2019-09-04 NOTE — Telephone Encounter (Signed)
The note is for a request refill after the current one that was sent. It's just an automated message on the controlled substance that I did not put but I will see if I can delete it for the pharmacist.

## 2019-09-05 DIAGNOSIS — E669 Obesity, unspecified: Secondary | ICD-10-CM | POA: Diagnosis not present

## 2019-09-06 DIAGNOSIS — E669 Obesity, unspecified: Secondary | ICD-10-CM | POA: Diagnosis not present

## 2019-09-07 DIAGNOSIS — E669 Obesity, unspecified: Secondary | ICD-10-CM | POA: Diagnosis not present

## 2019-09-08 DIAGNOSIS — E669 Obesity, unspecified: Secondary | ICD-10-CM | POA: Diagnosis not present

## 2019-09-09 DIAGNOSIS — E669 Obesity, unspecified: Secondary | ICD-10-CM | POA: Diagnosis not present

## 2019-09-10 DIAGNOSIS — E669 Obesity, unspecified: Secondary | ICD-10-CM | POA: Diagnosis not present

## 2019-09-11 DIAGNOSIS — E669 Obesity, unspecified: Secondary | ICD-10-CM | POA: Diagnosis not present

## 2019-09-12 DIAGNOSIS — E669 Obesity, unspecified: Secondary | ICD-10-CM | POA: Diagnosis not present

## 2019-09-13 DIAGNOSIS — E669 Obesity, unspecified: Secondary | ICD-10-CM | POA: Diagnosis not present

## 2019-09-14 DIAGNOSIS — E669 Obesity, unspecified: Secondary | ICD-10-CM | POA: Diagnosis not present

## 2019-09-15 DIAGNOSIS — E669 Obesity, unspecified: Secondary | ICD-10-CM | POA: Diagnosis not present

## 2019-09-16 DIAGNOSIS — E669 Obesity, unspecified: Secondary | ICD-10-CM | POA: Diagnosis not present

## 2019-09-17 DIAGNOSIS — E669 Obesity, unspecified: Secondary | ICD-10-CM | POA: Diagnosis not present

## 2019-09-18 DIAGNOSIS — E669 Obesity, unspecified: Secondary | ICD-10-CM | POA: Diagnosis not present

## 2019-09-19 DIAGNOSIS — E669 Obesity, unspecified: Secondary | ICD-10-CM | POA: Diagnosis not present

## 2019-09-20 DIAGNOSIS — E669 Obesity, unspecified: Secondary | ICD-10-CM | POA: Diagnosis not present

## 2019-09-21 DIAGNOSIS — E669 Obesity, unspecified: Secondary | ICD-10-CM | POA: Diagnosis not present

## 2019-09-22 DIAGNOSIS — E669 Obesity, unspecified: Secondary | ICD-10-CM | POA: Diagnosis not present

## 2019-09-23 DIAGNOSIS — E669 Obesity, unspecified: Secondary | ICD-10-CM | POA: Diagnosis not present

## 2019-09-24 DIAGNOSIS — E669 Obesity, unspecified: Secondary | ICD-10-CM | POA: Diagnosis not present

## 2019-09-25 DIAGNOSIS — E669 Obesity, unspecified: Secondary | ICD-10-CM | POA: Diagnosis not present

## 2019-09-26 DIAGNOSIS — E669 Obesity, unspecified: Secondary | ICD-10-CM | POA: Diagnosis not present

## 2019-09-27 DIAGNOSIS — E669 Obesity, unspecified: Secondary | ICD-10-CM | POA: Diagnosis not present

## 2019-09-28 DIAGNOSIS — E669 Obesity, unspecified: Secondary | ICD-10-CM | POA: Diagnosis not present

## 2019-09-29 DIAGNOSIS — E669 Obesity, unspecified: Secondary | ICD-10-CM | POA: Diagnosis not present

## 2019-09-30 DIAGNOSIS — E669 Obesity, unspecified: Secondary | ICD-10-CM | POA: Diagnosis not present

## 2019-10-01 ENCOUNTER — Other Ambulatory Visit: Payer: Self-pay

## 2019-10-01 ENCOUNTER — Encounter: Payer: Self-pay | Admitting: Student in an Organized Health Care Education/Training Program

## 2019-10-01 ENCOUNTER — Ambulatory Visit (INDEPENDENT_AMBULATORY_CARE_PROVIDER_SITE_OTHER): Payer: Medicaid Other | Admitting: Student in an Organized Health Care Education/Training Program

## 2019-10-01 VITALS — BP 118/80 | HR 66 | Wt 244.0 lb

## 2019-10-01 DIAGNOSIS — Z780 Asymptomatic menopausal state: Secondary | ICD-10-CM | POA: Diagnosis not present

## 2019-10-01 DIAGNOSIS — E890 Postprocedural hypothyroidism: Secondary | ICD-10-CM

## 2019-10-01 DIAGNOSIS — E669 Obesity, unspecified: Secondary | ICD-10-CM

## 2019-10-01 DIAGNOSIS — G8929 Other chronic pain: Secondary | ICD-10-CM

## 2019-10-01 NOTE — Assessment & Plan Note (Signed)
Continue current regimen

## 2019-10-01 NOTE — Assessment & Plan Note (Signed)
Checking TSH today. Will alter medication dose as needed

## 2019-10-01 NOTE — Assessment & Plan Note (Signed)
Provided with counseling and at-home exercises hand out

## 2019-10-01 NOTE — Patient Instructions (Addendum)
It was a pleasure to see you today!  To summarize our discussion for this visit:  We have stopped your venlafaxine. You can throw it away  I will refill your medications and check into the controlled substance database on the refills that got interrupted.   We are checking your thyroid today on the new dose of medication  Please continue to increase your exercise tolerance as we discussed. I've put some suggestions below  I've also included some exercises to help increase memory  Please return to our clinic to see me in 1 month.  Call the clinic at [phone] if your symptoms worsen or you have any concerns.   Thank you for allowing me to take part in your care,  Dr. Doristine Mango Memory Compensation Strategies  1. Use "WARM" strategy.  W= write it down  A= associate it  R= repeat it  M= make a mental note  2.   You can keep a Social worker.  Use a 3-ring notebook with sections for the following: calendar, important names and phone numbers,  medications, doctors' names/phone numbers, lists/reminders, and a section to journal what you did  each day.   3.    Use a calendar to write appointments down.  4.    Write yourself a schedule for the day.  This can be placed on the calendar or in a separate section of the Memory Notebook.  Keeping a  regular schedule can help memory.  5.    Use medication organizer with sections for each day or morning/evening pills.  You may need help loading it  6.    Keep a basket, or pegboard by the door.  Place items that you need to take out with you in the basket or on the pegboard.  You may also want to  include a message board for reminders.  7.    Use sticky notes.  Place sticky notes with reminders in a place where the task is performed.  For example: " turn off the  stove" placed by the stove, "lock the door" placed on the door at eye level, " take your medications" on  the bathroom mirror or by the place where you normally take  your medications.  8.    Use alarms/timers.  Use while cooking to remind yourself to check on food or as a reminder to take your medicine, or as a  reminder to make a call, or as a reminder to perform another task, etc.   Exercises To Do While Sitting  Exercises that you do while sitting (chair exercises) can give you many of the same benefits as full exercise. Benefits include strengthening your heart, burning calories, and keeping muscles and joints healthy. Exercise can also improve your mood and help with depression and anxiety. You may benefit from chair exercises if you are unable to do standing exercises because of:  Diabetic foot pain.  Obesity.  Illness.  Arthritis.  Recovery from surgery or injury.  Breathing problems.  Balance problems.  Another type of disability. Before starting chair exercises, check with your health care provider or a physical therapist to find out how much exercise you can tolerate and which exercises are safe for you. If your health care provider approves:  Start out slowly and build up over time. Aim to work up to about 10-20 minutes for each exercise session.  Make exercise part of your daily routine.  Drink water when you exercise. Do not wait until you are thirsty.  Drink every 10-15 minutes.  Stop exercising right away if you have pain, nausea, shortness of breath, or dizziness.  If you are exercising in a wheelchair, make sure to lock the wheels.  Ask your health care provider whether you can do tai chi or yoga. Many positions in these mind-body exercises can be modified to do while seated. Warm-up Before starting other exercises: 1. Sit up as straight as you can. Have your knees bent at 90 degrees, which is the shape of the capital letter "L." Keep your feet flat on the floor. 2. Sit at the front edge of your chair, if you can. 3. Pull in (tighten) the muscles in your abdomen and stretch your spine and neck as straight as you can.  Hold this position for a few minutes. 4. Breathe in and out evenly. Try to concentrate on your breathing, and relax your mind. Stretching Exercise A: Arm stretch 1. Hold your arms out straight in front of your body. 2. Bend your hands at the wrist with your fingers pointing up, as if signaling someone to stop. Notice the slight tension in your forearms as you hold the position. 3. Keeping your arms out and your hands bent, rotate your hands outward as far as you can and hold this stretch. Aim to have your thumbs pointing up and your pinkie fingers pointing down. Slowly repeat arm stretches for one minute as tolerated. Exercise B: Leg stretch 1. If you can move your legs, try to "draw" letters on the floor with the toes of your foot. Write your name with one foot. 2. Write your name with the toes of your other foot. Slowly repeat the movements for one minute as tolerated. Exercise C: Reach for the sky 1. Reach your hands as far over your head as you can to stretch your spine. 2. Move your hands and arms as if you are climbing a rope. Slowly repeat the movements for one minute as tolerated. Range of motion exercises Exercise A: Shoulder roll 1. Let your arms hang loosely at your sides. 2. Lift just your shoulders up toward your ears, then let them relax back down. 3. When your shoulders feel loose, rotate your shoulders in backward and forward circles. Do shoulder rolls slowly for one minute as tolerated. Exercise B: March in place 1. As if you are marching, pump your arms and lift your legs up and down. Lift your knees as high as you can. ? If you are unable to lift your knees, just pump your arms and move your ankles and feet up and down. March in place for one minute as tolerated. Exercise C: Seated jumping jacks 1. Let your arms hang down straight. 2. Keeping your arms straight, lift them up over your head. Aim to point your fingers to the ceiling. 3. While you lift your arms,  straighten your legs and slide your heels along the floor to your sides, as wide as you can. 4. As you bring your arms back down to your sides, slide your legs back together. ? If you are unable to use your legs, just move your arms. Slowly repeat seated jumping jacks for one minute as tolerated. Strengthening exercises Exercise A: Shoulder squeeze 1. Hold your arms straight out from your body to your sides, with your elbows bent and your fists pointed at the ceiling. 2. Keeping your arms in the bent position, move them forward so your elbows and forearms meet in front of your face. 3. Open your arms  back out as wide as you can with your elbows still bent, until you feel your shoulder blades squeezing together. Hold for 5 seconds. Slowly repeat the movements forward and backward for one minute as tolerated. Contact a health care provider if you:  Had to stop exercising due to any of the following: ? Pain. ? Nausea. ? Shortness of breath. ? Dizziness. ? Fatigue.  Have significant pain or soreness after exercising. Get help right away if you have:  Chest pain.  Difficulty breathing. These symptoms may represent a serious problem that is an emergency. Do not wait to see if the symptoms will go away. Get medical help right away. Call your local emergency services (911 in the [place] Do not drive yourself to the hospital. This information is not intended to replace advice given to you by your health care provider. Make sure you discuss any questions you have with your health care provider. Document Revised: 06/15/2018 Document Reviewed: 01/05/2017 Elsevier Patient Education  2020 [employer].

## 2019-10-01 NOTE — Assessment & Plan Note (Addendum)
Discontinue venlafaxine due to patient preference and side effects. Provided with memory exercises

## 2019-10-01 NOTE — Progress Notes (Signed)
    SUBJECTIVE:   CHIEF COMPLAINT / HPI: f/u, med refill  Menopause- Stopped venlafaxine- patient believes this is causing her breasts to be sore so has been taking. Has not had menstrtual cycle for a year.   Long term pain medications- concerns that the pharmacy is not going to refill her oxycodone. She was out for 3 days at last refill date.   Thyroid- TSH was low in April and we decreased levothyoxine from 200 to 175. Patient has been adherent with the medication daily but she is taking nightly after supper.  Memory loss- patient is concerned that she is having memory loss from the menopause. Is still able to perform ADL, IADLs.   Obesity -Patient has not been exercising more due to being uncomfortable and making her legs swell. She is drinking about 16oz of prune juice per day.   OBJECTIVE:   BP 118/80   Pulse 66   Wt (!) 244 lb (110.7 kg)   SpO2 98%   BMI 37.10 kg/m   General: NAD, pleasant, able to participate in exam Extremities: chronic edema improved today, no cyanosis. WWP. Skin: warm and dry, no rashes noted Neuro: alert and oriented x4, no focal deficits Psych: Normal affect and mood  ASSESSMENT/PLAN:   Menopause Discontinue venlafaxine due to patient preference and side effects. Provided with memory exercises  Hypothyroidism Checking TSH today. Will alter medication dose as needed  Chronic pain Continue current regimen  Obesity (BMI 30-39.9) Provided with counseling and at-home exercises hand out     [place]

## 2019-10-02 ENCOUNTER — Ambulatory Visit: Payer: Medicaid Other | Admitting: Student in an Organized Health Care Education/Training Program

## 2019-10-02 DIAGNOSIS — E669 Obesity, unspecified: Secondary | ICD-10-CM | POA: Diagnosis not present

## 2019-10-03 ENCOUNTER — Other Ambulatory Visit: Payer: Self-pay

## 2019-10-03 ENCOUNTER — Other Ambulatory Visit: Payer: Self-pay | Admitting: Student in an Organized Health Care Education/Training Program

## 2019-10-03 DIAGNOSIS — F419 Anxiety disorder, unspecified: Secondary | ICD-10-CM

## 2019-10-03 DIAGNOSIS — E669 Obesity, unspecified: Secondary | ICD-10-CM | POA: Diagnosis not present

## 2019-10-03 NOTE — Telephone Encounter (Signed)
Patient is calling to check on status of refill request. Patient is waiting on 4 meds to be refilled. Please contact patient when its complete. Thanks

## 2019-10-04 ENCOUNTER — Other Ambulatory Visit: Payer: Self-pay | Admitting: Student in an Organized Health Care Education/Training Program

## 2019-10-04 ENCOUNTER — Telehealth: Payer: Self-pay | Admitting: *Deleted

## 2019-10-04 DIAGNOSIS — E669 Obesity, unspecified: Secondary | ICD-10-CM | POA: Diagnosis not present

## 2019-10-04 DIAGNOSIS — E890 Postprocedural hypothyroidism: Secondary | ICD-10-CM

## 2019-10-04 LAB — TSH

## 2019-10-04 MED ORDER — OXYCODONE HCL 5 MG PO TABS: 5.0000 mg | ORAL_TABLET | Freq: Two times a day (BID) | ORAL | 0 refills | Status: DC | PRN

## 2019-10-04 NOTE — Telephone Encounter (Signed)
LMOVM for return call.  Christen Bame, CMA

## 2019-10-04 NOTE — Progress Notes (Signed)
Reordered future TSH as last test had insufficient sample size to run test.

## 2019-10-04 NOTE — Telephone Encounter (Signed)
-----   Message from Richarda Osmond, DO sent at 10/04/2019 10:52 AM EDT ----- Please contact patient to return for repeat lab draw. The thyroid test was cancelled due to not having enough sample to run the test. Thank you

## 2019-10-04 NOTE — Telephone Encounter (Signed)
Patient returns call to nurse line x 2, regarding status of rx refill.   Informed patient of 24-48 hour rx refill policy. Patient states that she has been requesting since Tuesday. Informed our records indicate first request yesterday. Advised patient that I would reach out to provider regarding refills.   Talbot Grumbling, RN

## 2019-10-05 DIAGNOSIS — E669 Obesity, unspecified: Secondary | ICD-10-CM | POA: Diagnosis not present

## 2019-10-06 DIAGNOSIS — E669 Obesity, unspecified: Secondary | ICD-10-CM | POA: Diagnosis not present

## 2019-10-07 DIAGNOSIS — E669 Obesity, unspecified: Secondary | ICD-10-CM | POA: Diagnosis not present

## 2019-10-08 DIAGNOSIS — E669 Obesity, unspecified: Secondary | ICD-10-CM | POA: Diagnosis not present

## 2019-10-09 DIAGNOSIS — E669 Obesity, unspecified: Secondary | ICD-10-CM | POA: Diagnosis not present

## 2019-10-10 DIAGNOSIS — E669 Obesity, unspecified: Secondary | ICD-10-CM | POA: Diagnosis not present

## 2019-10-15 DIAGNOSIS — E669 Obesity, unspecified: Secondary | ICD-10-CM | POA: Diagnosis not present

## 2019-10-16 DIAGNOSIS — E669 Obesity, unspecified: Secondary | ICD-10-CM | POA: Diagnosis not present

## 2019-10-17 DIAGNOSIS — E669 Obesity, unspecified: Secondary | ICD-10-CM | POA: Diagnosis not present

## 2019-10-18 DIAGNOSIS — E669 Obesity, unspecified: Secondary | ICD-10-CM | POA: Diagnosis not present

## 2019-10-19 DIAGNOSIS — E669 Obesity, unspecified: Secondary | ICD-10-CM | POA: Diagnosis not present

## 2019-10-20 DIAGNOSIS — E669 Obesity, unspecified: Secondary | ICD-10-CM | POA: Diagnosis not present

## 2019-10-21 DIAGNOSIS — E669 Obesity, unspecified: Secondary | ICD-10-CM | POA: Diagnosis not present

## 2019-10-22 DIAGNOSIS — E669 Obesity, unspecified: Secondary | ICD-10-CM | POA: Diagnosis not present

## 2019-10-23 DIAGNOSIS — E669 Obesity, unspecified: Secondary | ICD-10-CM | POA: Diagnosis not present

## 2019-10-24 DIAGNOSIS — E669 Obesity, unspecified: Secondary | ICD-10-CM | POA: Diagnosis not present

## 2019-10-25 DIAGNOSIS — E669 Obesity, unspecified: Secondary | ICD-10-CM | POA: Diagnosis not present

## 2019-10-26 DIAGNOSIS — E669 Obesity, unspecified: Secondary | ICD-10-CM | POA: Diagnosis not present

## 2019-10-27 DIAGNOSIS — E669 Obesity, unspecified: Secondary | ICD-10-CM | POA: Diagnosis not present

## 2019-10-28 DIAGNOSIS — E669 Obesity, unspecified: Secondary | ICD-10-CM | POA: Diagnosis not present

## 2019-10-29 DIAGNOSIS — E669 Obesity, unspecified: Secondary | ICD-10-CM | POA: Diagnosis not present

## 2019-10-30 DIAGNOSIS — E669 Obesity, unspecified: Secondary | ICD-10-CM | POA: Diagnosis not present

## 2019-10-31 ENCOUNTER — Ambulatory Visit (INDEPENDENT_AMBULATORY_CARE_PROVIDER_SITE_OTHER): Payer: Medicaid Other | Admitting: Student in an Organized Health Care Education/Training Program

## 2019-10-31 ENCOUNTER — Encounter: Payer: Self-pay | Admitting: Student in an Organized Health Care Education/Training Program

## 2019-10-31 ENCOUNTER — Other Ambulatory Visit: Payer: Self-pay

## 2019-10-31 VITALS — BP 120/80 | HR 69 | Wt 245.4 lb

## 2019-10-31 DIAGNOSIS — Z0000 Encounter for general adult medical examination without abnormal findings: Secondary | ICD-10-CM

## 2019-10-31 DIAGNOSIS — L301 Dyshidrosis [pompholyx]: Secondary | ICD-10-CM

## 2019-10-31 DIAGNOSIS — E785 Hyperlipidemia, unspecified: Secondary | ICD-10-CM | POA: Diagnosis not present

## 2019-10-31 DIAGNOSIS — G8929 Other chronic pain: Secondary | ICD-10-CM | POA: Diagnosis not present

## 2019-10-31 DIAGNOSIS — I890 Lymphedema, not elsewhere classified: Secondary | ICD-10-CM | POA: Diagnosis not present

## 2019-10-31 DIAGNOSIS — E669 Obesity, unspecified: Secondary | ICD-10-CM | POA: Diagnosis not present

## 2019-10-31 DIAGNOSIS — E890 Postprocedural hypothyroidism: Secondary | ICD-10-CM | POA: Diagnosis not present

## 2019-10-31 DIAGNOSIS — N939 Abnormal uterine and vaginal bleeding, unspecified: Secondary | ICD-10-CM | POA: Diagnosis not present

## 2019-10-31 DIAGNOSIS — M545 Low back pain: Secondary | ICD-10-CM

## 2019-10-31 MED ORDER — OXYCODONE HCL 5 MG PO TABS: 5.0000 mg | ORAL_TABLET | Freq: Two times a day (BID) | ORAL | 0 refills | Status: DC | PRN

## 2019-10-31 MED ORDER — CLOBETASOL PROPIONATE 0.05 % EX CREA: TOPICAL_CREAM | Freq: Two times a day (BID) | CUTANEOUS | 3 refills | Status: DC

## 2019-10-31 MED ORDER — PANTOPRAZOLE SODIUM 40 MG PO TBEC: 40.0000 mg | DELAYED_RELEASE_TABLET | Freq: Every day | ORAL | 0 refills | Status: DC

## 2019-10-31 MED ORDER — HYDROXYZINE HCL 10 MG PO TABS: ORAL_TABLET | ORAL | 0 refills | Status: DC

## 2019-10-31 MED ORDER — ALBUTEROL SULFATE HFA 108 (90 BASE) MCG/ACT IN AERS: INHALATION_SPRAY | RESPIRATORY_TRACT | 1 refills | Status: DC

## 2019-10-31 NOTE — Patient Instructions (Addendum)
It was a pleasure to see you today!  To summarize our discussion for this visit:  Please get your thyroid hormone checked today so we can make sure you're on the right dose of your medication   I will refill your medications by this afternoon.  I will send in a referral for physical therapy again and look into where you got your back injections  It looks like you saw Dr. Letta Pate, Luanna Salk, MD at [facility] for your back injections in 2017.  Some additional health maintenance measures we should update are: Health Maintenance Due  Topic Date Due  . INFLUENZA VACCINE  10/07/2019  .   Call the clinic at [phone] if your symptoms worsen or you have any concerns.   Thank you for allowing me to take part in your care,  Dr. Doristine Mango

## 2019-10-31 NOTE — Progress Notes (Signed)
   SUBJECTIVE:   CHIEF COMPLAINT / HPI: vaginal bleeding, medication refill, physical therapy, thyroid disorder  Vaginal bleeding- patient is perimenopausal. Last cycle was about 1 year ago. She had one day of normal period cramping and vaginal bleeding on 10/08/2019 what resolved. Has not had any issues since then. She is not taking any hormone replacement medications. Has history of uterine fibroids.  Medication refill-patient requests refills on almost all of her medications.  On chart review, the majority of her medications were recently refilled with multiple refills so there should not be that many due at this time if she has been taking them as directed.  Lymphedema-patient is still interested in physical therapy to help with her lower extremity swelling and pain  Hypothyroidism-last appointment we discussed checking her thyroid hormone but it was not complete at that time.  I had called the patient and asked her to come in for a lab visit after that appointment to get her hormone checked but she did not comply.  She states that she is willing to get her blood drawn today to check that.  OBJECTIVE:   BP 120/80   Pulse 69   Wt 245 lb 6.4 oz (111.3 kg)   SpO2 97%   BMI 37.31 kg/m   General: NAD, pleasant, able to participate in exam Neck: negative for masses in thyroid region. Cardiac: RRR, normal heart sounds, no murmurs. 2+ radial and PT pulses bilaterally Respiratory: CTAB, normal effort, No wheezes, rales or rhonchi Extremities: non-pitting lymphedema. Looks improved from previous Skin: warm and dry, no rashes noted Neuro: alert and oriented x4, no focal deficits Psych: Normal affect and mood  ASSESSMENT/PLAN:   Vagina bleeding Perimenopausal with one day of regular period bleeding and cramping on 8/2. No reocurrance since then. Last regular cycle was almost a year ago. Patient does have known uterine fibroids. Is not taking any hormone replacements at this time. precepted  with Dr. Gwendlyn Deutscher. We will continue to watch at this time. If she has another occurrence of bleeding, we will consider uterine biopsy and/or [place] to assess.  Patient was instructed to inform me if she has any more vaginal bleeding.  Lymphedema - recommended again that she get compression stockings/leggings - put in another referral to physical therapy  Hypothyroidism Patient agreed to get her thyroid hormone checked today which is my third attempt but then left the clinic without going to the lab or checking out with provider. Called patient and left her a voicemail to come back and get her blood drawn. Put in future order for TSH. Last reading was 06/2019 and showed low TSH. Will not refill thyroid medication today     [place]

## 2019-11-01 DIAGNOSIS — E669 Obesity, unspecified: Secondary | ICD-10-CM | POA: Diagnosis not present

## 2019-11-02 ENCOUNTER — Telehealth: Payer: Self-pay

## 2019-11-02 DIAGNOSIS — E669 Obesity, unspecified: Secondary | ICD-10-CM | POA: Diagnosis not present

## 2019-11-02 DIAGNOSIS — N939 Abnormal uterine and vaginal bleeding, unspecified: Secondary | ICD-10-CM | POA: Insufficient documentation

## 2019-11-02 MED ORDER — OXYCODONE HCL 5 MG PO TABS: 5.0000 mg | ORAL_TABLET | Freq: Two times a day (BID) | ORAL | 0 refills | Status: DC | PRN

## 2019-11-02 NOTE — Addendum Note (Signed)
Addended by: Richarda Osmond on: 11/02/2019 10:16 AM   Modules accepted: Orders

## 2019-11-02 NOTE — Telephone Encounter (Signed)
I altered the prescription to be more clear to the pharmacy

## 2019-11-02 NOTE — Assessment & Plan Note (Signed)
-   recommended again that she get compression stockings/leggings - put in another referral to physical therapy

## 2019-11-02 NOTE — Telephone Encounter (Signed)
Patient calls nurse line reporting problems obtaining 8/25 oxycodone prescription. I called the pharmacy and due to "Notes to Pharmacy: Next refill not before 12/01/2019," they can not fill. Apparently the verbiage is confusing to them even though it also states "Earliest Fill Date: 10/31/2019." Please take out the September information all together, otherwise they will not be able to fill. They would not take a verbal as it is a controlled substance and whole new prescription needs to be resent. Please advise.

## 2019-11-02 NOTE — Assessment & Plan Note (Signed)
Perimenopausal with one day of regular period bleeding and cramping on 8/2. No reocurrance since then. Last regular cycle was almost a year ago. Patient does have known uterine fibroids. Is not taking any hormone replacements at this time. precepted with Dr. Gwendlyn Deutscher. We will continue to watch at this time. If she has another occurrence of bleeding, we will consider uterine biopsy and/or [place] to assess.  Patient was instructed to inform me if she has any more vaginal bleeding.

## 2019-11-02 NOTE — Assessment & Plan Note (Signed)
Patient agreed to get her thyroid hormone checked today which is my third attempt but then left the clinic without going to the lab or checking out with provider. Called patient and left her a voicemail to come back and get her blood drawn. Put in future order for TSH. Last reading was 06/2019 and showed low TSH. Will not refill thyroid medication today

## 2019-11-03 DIAGNOSIS — E669 Obesity, unspecified: Secondary | ICD-10-CM | POA: Diagnosis not present

## 2019-11-04 DIAGNOSIS — E669 Obesity, unspecified: Secondary | ICD-10-CM | POA: Diagnosis not present

## 2019-11-05 DIAGNOSIS — E669 Obesity, unspecified: Secondary | ICD-10-CM | POA: Diagnosis not present

## 2019-11-06 DIAGNOSIS — E669 Obesity, unspecified: Secondary | ICD-10-CM | POA: Diagnosis not present

## 2019-11-07 ENCOUNTER — Other Ambulatory Visit: Payer: Self-pay | Admitting: *Deleted

## 2019-11-07 DIAGNOSIS — G8929 Other chronic pain: Secondary | ICD-10-CM

## 2019-11-07 NOTE — Progress Notes (Signed)
Reordered referral per Physical medicine office due to them needing to authorize it.  [name],CMA

## 2019-11-09 ENCOUNTER — Other Ambulatory Visit: Payer: Medicaid Other

## 2019-11-09 ENCOUNTER — Other Ambulatory Visit: Payer: Self-pay | Admitting: Student in an Organized Health Care Education/Training Program

## 2019-11-09 ENCOUNTER — Other Ambulatory Visit: Payer: Self-pay

## 2019-11-09 DIAGNOSIS — E890 Postprocedural hypothyroidism: Secondary | ICD-10-CM

## 2019-11-13 ENCOUNTER — Other Ambulatory Visit: Payer: Medicaid Other

## 2019-11-14 ENCOUNTER — Other Ambulatory Visit: Payer: Medicaid Other

## 2019-11-14 ENCOUNTER — Other Ambulatory Visit: Payer: Self-pay

## 2019-11-14 DIAGNOSIS — E890 Postprocedural hypothyroidism: Secondary | ICD-10-CM

## 2019-11-15 LAB — T3, FREE: T3, Free: 2.4 pg/mL (ref 2.0–4.4)

## 2019-11-15 LAB — T4, FREE: Free T4: 1.08 ng/dL (ref 0.82–1.77)

## 2019-11-15 LAB — TSH: TSH: 0.175 u[IU]/mL — ABNORMAL LOW (ref 0.450–4.500)

## 2019-11-16 ENCOUNTER — Other Ambulatory Visit: Payer: Self-pay | Admitting: Student in an Organized Health Care Education/Training Program

## 2019-11-16 DIAGNOSIS — E890 Postprocedural hypothyroidism: Secondary | ICD-10-CM

## 2019-11-16 MED ORDER — LEVOTHYROXINE SODIUM 150 MCG PO TABS: ORAL_TABLET | ORAL | 0 refills | Status: DC

## 2019-11-16 NOTE — Progress Notes (Signed)
Patient's TSH was low on current dose of 154mcg. Will reduce levothyroxine to 127mcg and recheck TSH in 1 month.

## 2019-11-29 ENCOUNTER — Other Ambulatory Visit: Payer: Self-pay | Admitting: Student in an Organized Health Care Education/Training Program

## 2019-12-03 ENCOUNTER — Ambulatory Visit: Payer: Medicaid Other | Admitting: Physical Medicine and Rehabilitation

## 2019-12-03 ENCOUNTER — Telehealth: Payer: Self-pay | Admitting: Physical Medicine and Rehabilitation

## 2019-12-03 NOTE — Telephone Encounter (Signed)
Pt would like to reschedule her appt from 12/03/19 please   [phone]

## 2019-12-04 ENCOUNTER — Other Ambulatory Visit: Payer: Self-pay

## 2019-12-04 ENCOUNTER — Ambulatory Visit (INDEPENDENT_AMBULATORY_CARE_PROVIDER_SITE_OTHER): Payer: Medicaid Other | Admitting: Student in an Organized Health Care Education/Training Program

## 2019-12-04 DIAGNOSIS — I890 Lymphedema, not elsewhere classified: Secondary | ICD-10-CM | POA: Diagnosis not present

## 2019-12-04 DIAGNOSIS — Z9189 Other specified personal risk factors, not elsewhere classified: Secondary | ICD-10-CM | POA: Diagnosis not present

## 2019-12-04 DIAGNOSIS — M47816 Spondylosis without myelopathy or radiculopathy, lumbar region: Secondary | ICD-10-CM

## 2019-12-04 MED ORDER — MEDICAL COMPRESSION STOCKINGS MISC: 2.0000 [IU] | Freq: Every day | 0 refills | Status: AC

## 2019-12-04 NOTE — Telephone Encounter (Signed)
Rescheduled

## 2019-12-04 NOTE — Progress Notes (Signed)
   SUBJECTIVE:   CHIEF COMPLAINT / HPI: check up, medication refill  Vaginal bleeding- appointment notes were about vaginal bleeding f/u but patient denies any vaginal bleeding. She had some mild pelvic cramping and thought she was going to have a period but did not.  Lymphedema- stable. Has not had contact with PT or been able to obtain compression stockings but would still like to pursue. Pain medications still helping. She is aware that she does not need to be seen every month for refill if there is no change but she prefers to come to monthly appointments.   OBJECTIVE:   BP 118/80   Pulse 78   Ht 5\' 8"  (1.727 m)   Wt 250 lb 12.8 oz (113.8 kg)   SpO2 100%   BMI 38.13 kg/m   General: NAD, pleasant, able to participate in exam Cardiac: RRR, normal heart sounds, no murmurs. 2+ radial and PT pulses bilaterally Respiratory: CTAB, normal effort, No wheezes, rales or rhonchi Extremities: non-pitting edema bilateral. Chronic and stable without skin ulcers. Skin: warm and dry, no rashes noted Neuro: alert and oriented, no focal deficits Psych: Normal affect and mood  ASSESSMENT/PLAN:   Lymphedema Given printed prescription for compression stockings and the name/adress of medical supply store Provided her with PT contact information from previous referral again and encouraged her to call to set up appointment  Spondylosis of lumbar region without myelopathy or radiculopathy - Refilled oxycodone 5mg  BID PRN - continue to follow up with ortho  At risk for polypharmacy Reviewed all of medications and the refill dates. At this time, if patient has been using her medications as prescribed, she should not need any refills other than oxycodone. Patient describes taking all of her pills in a single pill box. Patient often requests refill of all of her medications at each appointment. I again encouraged patient to reach out to her pharmacy for refills to be sent to our office from them when  she is out of refills. Requesting patient bring in all her medications to her next appointment.    [place]

## 2019-12-04 NOTE — Patient Instructions (Signed)
It was a pleasure to see you today!  To summarize our discussion for this visit:  I will refill your medications by this afternoon.   Rehab center number is: [phone]) [phone]. Call to set up PT sessions  I printed a prescription for compression stockings which you can try to take to this place:  [facility]  [address] [place], [place], [place] [address]  It might be nice to get out and make new friends. Try finding a place you can volunteer, or join a class for free  Some additional health maintenance measures we should update are: Health Maintenance Due  Topic Date Due   INFLUENZA VACCINE  10/07/2019     Call the clinic at [phone] if your symptoms worsen or you have any concerns.   Thank you for allowing me to take part in your care,  Dr. Doristine Mango

## 2019-12-05 ENCOUNTER — Telehealth: Payer: Self-pay

## 2019-12-05 DIAGNOSIS — Z9189 Other specified personal risk factors, not elsewhere classified: Secondary | ICD-10-CM | POA: Insufficient documentation

## 2019-12-05 DIAGNOSIS — E785 Hyperlipidemia, unspecified: Secondary | ICD-10-CM

## 2019-12-05 MED ORDER — OXYCODONE HCL 5 MG PO TABS
5.0000 mg | ORAL_TABLET | Freq: Two times a day (BID) | ORAL | 0 refills | Status: DC | PRN
Start: 2019-12-05 — End: 2020-01-01

## 2019-12-05 NOTE — Assessment & Plan Note (Addendum)
Reviewed all of medications and the refill dates. At this time, if patient has been using her medications as prescribed, she should not need any refills other than oxycodone. Patient describes taking all of her pills in a single pill box. Patient often requests refill of all of her medications at each appointment. I again encouraged patient to reach out to her pharmacy for refills to be sent to our office from them when she is out of refills. Requesting patient bring in all her medications to her next appointment.

## 2019-12-05 NOTE — Telephone Encounter (Signed)
Patient calls nurse line to check on status of medication refills from yesterday's office visit. I am unable to see where any medications were sent in yesterday. Asked patient which medications needed to be refilled and patient states that provider is aware of the medications that need to be refilled.   To PCP  Talbot Grumbling, RN

## 2019-12-05 NOTE — Assessment & Plan Note (Signed)
-   Refilled oxycodone 5mg  BID PRN - continue to follow up with ortho

## 2019-12-05 NOTE — Telephone Encounter (Signed)
Patient had inhaler refilled 8/25 with 1 refill. She did not describe any respiratory complaints to me at our appointment. She should not be out of that prescription yet.  I'm not sure which anti-nausea medication she's referring to as that's not something we discussed and I don't regularly prescribe. I will refill her oxycodone.

## 2019-12-05 NOTE — Telephone Encounter (Signed)
Patient returns call to nurse line to check on status of medication refills on the following medications; - oxycodone - anti nausea medication - albuterol inhaler  To PCP  Talbot Grumbling, RN

## 2019-12-05 NOTE — Assessment & Plan Note (Signed)
Given printed prescription for compression stockings and the name/adress of medical supply store Provided her with PT contact information from previous referral again and encouraged her to call to set up appointment

## 2019-12-06 ENCOUNTER — Other Ambulatory Visit: Payer: Self-pay | Admitting: Student in an Organized Health Care Education/Training Program

## 2019-12-07 ENCOUNTER — Other Ambulatory Visit: Payer: Self-pay | Admitting: Student in an Organized Health Care Education/Training Program

## 2019-12-21 ENCOUNTER — Other Ambulatory Visit: Payer: Self-pay | Admitting: Student in an Organized Health Care Education/Training Program

## 2019-12-21 DIAGNOSIS — F419 Anxiety disorder, unspecified: Secondary | ICD-10-CM

## 2019-12-24 ENCOUNTER — Ambulatory Visit: Payer: Self-pay

## 2019-12-24 ENCOUNTER — Telehealth: Payer: Self-pay | Admitting: Physical Medicine and Rehabilitation

## 2019-12-24 ENCOUNTER — Other Ambulatory Visit: Payer: Self-pay

## 2019-12-24 ENCOUNTER — Encounter: Payer: Self-pay | Admitting: Physical Medicine and Rehabilitation

## 2019-12-24 ENCOUNTER — Ambulatory Visit (INDEPENDENT_AMBULATORY_CARE_PROVIDER_SITE_OTHER): Payer: Medicaid Other | Admitting: Physical Medicine and Rehabilitation

## 2019-12-24 VITALS — BP 135/88 | HR 59

## 2019-12-24 DIAGNOSIS — M5116 Intervertebral disc disorders with radiculopathy, lumbar region: Secondary | ICD-10-CM | POA: Diagnosis not present

## 2019-12-24 DIAGNOSIS — M5416 Radiculopathy, lumbar region: Secondary | ICD-10-CM

## 2019-12-24 MED ORDER — METHYLPREDNISOLONE ACETATE 80 MG/ML IJ SUSP
80.0000 mg | Freq: Once | INTRAMUSCULAR | Status: AC
  Administered 2019-12-24: 80 mg

## 2019-12-24 NOTE — Progress Notes (Signed)
Pt state lower back pain. Pt state standing and bending makes the pain worse. Pt state she try Physical therapy and pain med helps ease the pain.   Numeric Pain Rating Scale and Functional Assessment Average Pain 8   In the last MONTH (on 0-10 scale) has pain interfered with the following?  1. General activity like being  able to carry out your everyday physical activities such as walking, climbing stairs, carrying groceries, or moving a chair?  Rating(10)   +Driver, -BT, -Dye Allergies.

## 2019-12-24 NOTE — Telephone Encounter (Signed)
Patient stated she didn't realized her appt was at 8:15am today and would like a later in the day appt for today or just reschedule if possible. Please call patient at [phone].

## 2019-12-27 ENCOUNTER — Other Ambulatory Visit: Payer: Self-pay

## 2019-12-27 DIAGNOSIS — F32A Depression, unspecified: Secondary | ICD-10-CM

## 2019-12-27 DIAGNOSIS — E785 Hyperlipidemia, unspecified: Secondary | ICD-10-CM

## 2019-12-27 DIAGNOSIS — F419 Anxiety disorder, unspecified: Secondary | ICD-10-CM

## 2019-12-27 MED ORDER — PRAVASTATIN SODIUM 20 MG PO TABS: 20.0000 mg | ORAL_TABLET | Freq: Every day | ORAL | 3 refills | Status: DC

## 2019-12-28 ENCOUNTER — Other Ambulatory Visit: Payer: Self-pay | Admitting: *Deleted

## 2019-12-28 MED ORDER — DEXLANSOPRAZOLE 60 MG PO CPDR: 60.0000 mg | DELAYED_RELEASE_CAPSULE | Freq: Every day | ORAL | 5 refills | Status: DC

## 2020-01-01 ENCOUNTER — Ambulatory Visit (INDEPENDENT_AMBULATORY_CARE_PROVIDER_SITE_OTHER): Payer: Medicaid Other | Admitting: Student in an Organized Health Care Education/Training Program

## 2020-01-01 ENCOUNTER — Encounter: Payer: Self-pay | Admitting: Student in an Organized Health Care Education/Training Program

## 2020-01-01 ENCOUNTER — Other Ambulatory Visit: Payer: Self-pay

## 2020-01-01 VITALS — BP 110/78 | HR 64 | Ht 68.0 in | Wt 249.8 lb

## 2020-01-01 DIAGNOSIS — F419 Anxiety disorder, unspecified: Secondary | ICD-10-CM

## 2020-01-01 DIAGNOSIS — N3281 Overactive bladder: Secondary | ICD-10-CM

## 2020-01-01 DIAGNOSIS — F32A Depression, unspecified: Secondary | ICD-10-CM | POA: Diagnosis not present

## 2020-01-01 DIAGNOSIS — R350 Frequency of micturition: Secondary | ICD-10-CM | POA: Diagnosis not present

## 2020-01-01 DIAGNOSIS — G8929 Other chronic pain: Secondary | ICD-10-CM | POA: Diagnosis not present

## 2020-01-01 LAB — POCT URINALYSIS DIP (MANUAL ENTRY)
Bilirubin, UA: NEGATIVE
Blood, UA: NEGATIVE
Glucose, UA: NEGATIVE mg/dL
Ketones, POC UA: NEGATIVE mg/dL
Leukocytes, UA: NEGATIVE
Nitrite, UA: NEGATIVE
Protein Ur, POC: NEGATIVE mg/dL
Spec Grav, UA: >=1.03 — AB (ref 1.010–1.025)
Urobilinogen, UA: 1 E.U./dL
pH, UA: 5.5 (ref 5.0–8.0)

## 2020-01-01 MED ORDER — OXYCODONE HCL 5 MG PO TABS
5.0000 mg | ORAL_TABLET | Freq: Two times a day (BID) | ORAL | 0 refills | Status: DC | PRN
Start: 2020-01-01 — End: 2020-02-28

## 2020-01-01 MED ORDER — ALBUTEROL SULFATE HFA 108 (90 BASE) MCG/ACT IN AERS
INHALATION_SPRAY | RESPIRATORY_TRACT | 1 refills | Status: DC
Start: 2020-01-01 — End: 2020-06-09

## 2020-01-01 NOTE — Assessment & Plan Note (Addendum)
-   recommended further evaluation by urology referral. Patient would likely benefit from pelvic floor PT. Patient declined this recommendation at this time.  - urinalysis neg for infection today - trial of medication for overactive bladder. Will reassess at next appointment. Consider PVR

## 2020-01-01 NOTE — Progress Notes (Signed)
    SUBJECTIVE:   CHIEF COMPLAINT / HPI: f/u back/leg pain, frequent urination  Urinary frequency- so many times that she uses a whole roll of tissues in a day sometimes but cannot quantify. Couldn't even go to church because worried about how many times she's have to use the bathroom. No dysuria. Doesn't feel like she's emptying all the way. No leakage or incontinence. Positive urgency. No redness/blood in urine. Never tried any medications. No stress leakage.   Chronic pain- recent epidural injection 10/18. Had some modest pain improvement. Possibly scheduled a physical therapy appointment.   Scheduled eye appointment  Menopause- panic attacks when she feels hormonal changes coming on. She thinks it's when she's home alone and doesn't have any distractions. 1-2x/week but only in the middle of the month when she thinks she's got menopause symptoms. Has been getting progressively worse. discontinued hydroxyzine because she didn't feel like it helped. Gets under covers and waits it out until it passes. Denies SI.  Open to trying counseling but specifically wants to stay at [facility].  OBJECTIVE:   BP 110/78   Pulse 64   Ht 5\' 8"  (1.727 m)   Wt 249 lb 12.8 oz (113.3 kg)   SpO2 98%   BMI 37.98 kg/m   General: NAD, pleasant, able to participate in exam Skin: warm and dry, no rashes noted Neuro: alert and oriented, no focal deficits Psych: Normal affect and mood  ASSESSMENT/PLAN:   Overactive bladder - recommended further evaluation by urology referral. Patient would likely benefit from pelvic floor PT. Patient declined this recommendation at this time.  - urinalysis neg for infection today - trial of medication for overactive bladder. Will reassess at next appointment. Consider PVR  Chronic pain Once again provided patient with contact information in AVS for her PT referral and highlighted it. Refilled oxycodone  Anxiety and depression Patient self-discontinued atarax. Having  worsening anxiety but has some confusion on what triggers it. She is open to counseling and was able to have a warm hand-off in office today with Dr. Hartford Poli. They scheduled a follow up apt in a week.      Fisher

## 2020-01-01 NOTE — Patient Instructions (Signed)
It was a pleasure to see you today!  To summarize our discussion for this visit:  Rehab center number is: [phone]) [phone]. Call to set up PT sessions - refilling oxycodone, zofran and inhalers today - sending in a new medication for overactive bladder.  - all your other medications should still be current. Let your pharmacy know if you need more of anything.  - please bring your medications with you to our next appointment. - please see Dr. Hartford Poli for your counseling appointment next week  Please return to our clinic to see me in a month. Please see Dr. Hartford Poli for counseling next week.   Call the clinic at [phone] if your symptoms worsen or you have any concerns.   Thank you for allowing me to take part in your care,  Dr. Doristine Mango

## 2020-01-03 ENCOUNTER — Telehealth: Payer: Self-pay

## 2020-01-03 NOTE — Telephone Encounter (Signed)
Patient calls nurse line regarding UA, oxycodone rx and overactive bladder medication.   Informed patient of negative UA results. Called and verified with pharmacy that patient was able to successfully pick up oxycodone rx. However, pharmacy does not have medication for overactive bladder.   To PCP  Please advise  Talbot Grumbling, RN

## 2020-01-03 NOTE — Assessment & Plan Note (Signed)
Patient self-discontinued atarax. Having worsening anxiety but has some confusion on what triggers it. She is open to counseling and was able to have a warm hand-off in office today with Dr. Hartford Poli. They scheduled a follow up apt in a week.

## 2020-01-03 NOTE — Assessment & Plan Note (Addendum)
Once again provided patient with contact information in AVS for her PT referral and highlighted it. Refilled oxycodone

## 2020-01-04 ENCOUNTER — Telehealth: Payer: Self-pay

## 2020-01-04 NOTE — Telephone Encounter (Signed)
Patient calls nurse line regarding prescription for compression stockings. Patient states that medical supply store needs size and pressure level to be indicated on rx.   Please advise  Talbot Grumbling, RN

## 2020-01-09 MED ORDER — MIRABEGRON ER 25 MG PO TB24: 25.0000 mg | ORAL_TABLET | Freq: Every day | ORAL | 1 refills | Status: DC

## 2020-01-09 NOTE — Telephone Encounter (Signed)
Called patient and LVM informed her that I am seeking prior auth for the bladder tx and it may take a while. Also discussed compression stocking pressure prescription is new to me and I'll have to look into that.

## 2020-01-09 NOTE — Telephone Encounter (Signed)
Patient calls nurse line again about DME prescription for compression stockings. In order for her insurance to pay, size and pressure need to be indicated on the prescription. Patient reports her legs are different sizes and for the provide to keep that in mind. Patient also requesting over active bladder medication that was supposed to be sent in at last visit.

## 2020-01-10 ENCOUNTER — Ambulatory Visit: Payer: Medicaid Other | Admitting: Psychology

## 2020-01-10 NOTE — Telephone Encounter (Signed)
Forwarding to Page who does PA's currently. Christen Bame, CMA

## 2020-01-13 NOTE — Progress Notes (Signed)
[name] - 54 y.o. female MRN [id number]  Date of birth: 1965/03/28  Office Visit Note: Visit Date: 12/24/2019 PCP: Richarda Osmond, DO Referred by: Richarda Osmond, DO  Subjective: Chief Complaint  Patient presents with  . Lower Back - Pain   HPI:  [name] is a 53 y.o. female who comes in today at the request of Dr. Laurence Spates for planned Right L5-S1 Lumbar epidural steroid injection with fluoroscopic guidance.  The patient has failed conservative care including home exercise, medications, time and activity modification.  This injection will be diagnostic and hopefully therapeutic.  Please see requesting physician notes for further details and justification.  Referring provider: Dr. Rodell Perna and Doristine Mango, DO (PCP)  MRI reviewed with images and spine model.  MRI reviewed in the note below.  Prior MRI in the latter part of last year did quite a bit of relief.  She has had worsening symptoms recently without specific new onset injury.  She reports 8 out of 10 pain despite physical therapy and pain medication.  Those do ease the symptoms better just not doing well.  ROS Otherwise per HPI.  Assessment & Plan: Visit Diagnoses:  1. Lumbar radiculopathy   2. Radiculopathy due to lumbar intervertebral disc disorder     Plan: No additional findings.   Meds & Orders:  Meds ordered this encounter  Medications  . methylPREDNISolone acetate (DEPO-MEDROL) injection 80 mg    Orders Placed This Encounter  Procedures  . XR C-ARM NO REPORT  . Epidural Steroid injection    Follow-up: Return for visit to requesting physician as needed.   Procedures: No procedures performed  Lumbar Epidural Steroid Injection - Interlaminar Approach with Fluoroscopic Guidance  Patient: [name]      Date of Birth: 04-Feb-1966 MRN: [id number] PCP: Richarda Osmond, DO      Visit Date: 12/24/2019   Universal Protocol:     Consent Given By: the patient  Position:  PRONE  Additional Comments: Vital signs were monitored before and after the procedure. Patient was prepped and draped in the usual sterile fashion. The correct patient, procedure, and site was verified.   Injection Procedure Details:   Procedure diagnoses:  1. Lumbar radiculopathy   2. Radiculopathy due to lumbar intervertebral disc disorder      Meds Administered:  Meds ordered this encounter  Medications  . methylPREDNISolone acetate (DEPO-MEDROL) injection 80 mg     Laterality: Right  Location/Site:  L5-S1  Needle size: 20 G  Needle type: Tuohy  Needle Placement: Paramedian epidural  Findings:   -Comments: Excellent flow of contrast into the epidural space.  Procedure Details: Using a paramedian approach from the side mentioned above, the region overlying the inferior lamina was localized under fluoroscopic visualization and the soft tissues overlying this structure were infiltrated with 4 ml. of 1% Lidocaine without Epinephrine. The Tuohy needle was inserted into the epidural space using a paramedian approach.   The epidural space was localized using loss of resistance along with counter oblique bi-planar fluoroscopic views.  After negative aspirate for air, blood, and CSF, a 2 ml. volume of Isovue-250 was injected into the epidural space and the flow of contrast was observed. Radiographs were obtained for documentation purposes.    The injectate was administered into the level noted above.   Additional Comments:  The patient tolerated the procedure well Dressing: 2 x 2 sterile gauze and Band-Aid    Post-procedure details: Patient was observed during  the procedure. Post-procedure instructions were reviewed.  Patient left the clinic in stable condition.    Clinical History: MRI LUMBAR SPINE WITHOUT CONTRAST  TECHNIQUE: Multiplanar, multisequence MR imaging of the lumbar spine was performed. No intravenous contrast was administered.  COMPARISON:  Jul 26, 2014  FINDINGS: Segmentation: There are 5 non-rib bearing lumbar type vertebral bodies with the last intervertebral disc space labeled as L5-S1.  Alignment:  Normal  Vertebrae: The vertebral body heights are well maintained. No fracture, marrow edema,or pathologic marrow infiltration.  Conus medullaris and cauda equina: Conus extends to the L1-2 level. Conus and cauda equina appear normal.  Paraspinal and other soft tissues: The paraspinal soft tissues and visualized retroperitoneal structures are unremarkable. The sacroiliac joints are intact.  Disc levels:  T12-L1:  No significant canal or neural foraminal narrowing.  L1-L2:   No significant canal or neural foraminal narrowing.  L2-L3: There is a minimal broad-based disc bulge with ligamentum flavum hypertrophy which causes mild bilateral neural foraminal narrowing.  L3-L4: There is a broad-based disc bulge with ligamentum flavum hypertrophy and facet arthrosis which causes moderate bilateral neural foraminal narrowing. There is mild effacement anterior thecal sac.  L4-L5: There is a broad-based disc bulge with ligamentum flavum hypertrophy and facet arthrosis. There is moderate bilateral neural foraminal narrowing. Narrowing. The central thecal sac appears to be mildly effaced measuring 8 mm in AP diameter.  L5-S1: There is a broad-based disc bulge with a right paracentral disc protrusion which contacts the descending right S1 nerve root. There is facet arthrosis and ligamentum flavum hypertrophy. Severe right and moderate to severe left neural foraminal narrowing is seen. The central thecal sac measures 7 mm in AP diameter.  IMPRESSION: Lumbar spine spondylosis most notable at L5-S1 with a right paracentral disc protrusion which contacts the descending right S1 nerve root. There is also severe right and moderate to severe left neural foraminal narrowing with mild central canal narrowing. This is  slightly progressed since the prior exam.   Electronically Signed   By: Prudencio Pair [name]   On: 11/23/2018 13:55     Objective:  VS:  HT:    WT:   BMI:     BP:135/88  HR:(!) 59bpm  TEMP: ( )  RESP:  Physical Exam Constitutional:      General: She is not in acute distress.    Appearance: Normal appearance. She is not ill-appearing.  HENT:     Head: Normocephalic and atraumatic.     Right Ear: External ear normal.     Left Ear: External ear normal.  Eyes:     Extraocular Movements: Extraocular movements intact.  Cardiovascular:     Rate and Rhythm: Normal rate.     Pulses: Normal pulses.  Musculoskeletal:     Right lower leg: No edema.     Left lower leg: No edema.     Comments: Patient has good distal strength with no pain over the greater trochanters.  No clonus or focal weakness.  Skin:    Findings: No erythema, lesion or rash.  Neurological:     General: No focal deficit present.     Mental Status: She is alert and oriented to person, place, and time.     Sensory: No sensory deficit.     Motor: No weakness or abnormal muscle tone.     Coordination: Coordination normal.  Psychiatric:        Mood and Affect: Mood normal.        Behavior: Behavior  normal.      Imaging: No results found.

## 2020-01-13 NOTE — Procedures (Signed)
Lumbar Epidural Steroid Injection - Interlaminar Approach with Fluoroscopic Guidance  Patient: [name]      Date of Birth: [birth year] MRN: [id number] PCP: Richarda Osmond, DO      Visit Date: 12/24/2019   Universal Protocol:     Consent Given By: the patient  Position: PRONE  Additional Comments: Vital signs were monitored before and after the procedure. Patient was prepped and draped in the usual sterile fashion. The correct patient, procedure, and site was verified.   Injection Procedure Details:   Procedure diagnoses:  1. Lumbar radiculopathy   2. Radiculopathy due to lumbar intervertebral disc disorder      Meds Administered:  Meds ordered this encounter  Medications  . methylPREDNISolone acetate (DEPO-MEDROL) injection 80 mg     Laterality: Right  Location/Site:  L5-S1  Needle size: 20 G  Needle type: Tuohy  Needle Placement: Paramedian epidural  Findings:   -Comments: Excellent flow of contrast into the epidural space.  Procedure Details: Using a paramedian approach from the side mentioned above, the region overlying the inferior lamina was localized under fluoroscopic visualization and the soft tissues overlying this structure were infiltrated with 4 ml. of 1% Lidocaine without Epinephrine. The Tuohy needle was inserted into the epidural space using a paramedian approach.   The epidural space was localized using loss of resistance along with counter oblique bi-planar fluoroscopic views.  After negative aspirate for air, blood, and CSF, a 2 ml. volume of Isovue-250 was injected into the epidural space and the flow of contrast was observed. Radiographs were obtained for documentation purposes.    The injectate was administered into the level noted above.   Additional Comments:  The patient tolerated the procedure well Dressing: 2 x 2 sterile gauze and Band-Aid    Post-procedure details: Patient was observed during the  procedure. Post-procedure instructions were reviewed.  Patient left the clinic in stable condition.

## 2020-01-14 ENCOUNTER — Ambulatory Visit: Payer: Medicaid Other | Admitting: Psychology

## 2020-01-14 NOTE — Telephone Encounter (Signed)
Received fax from pharmacy, PA needed on Myrbetriq. Clinical questions submitted via Cover My Meds. Waiting on response, could take up to 72 hours.  Cover My Meds info: Key: [id number]

## 2020-01-15 ENCOUNTER — Other Ambulatory Visit: Payer: Self-pay | Admitting: Student in an Organized Health Care Education/Training Program

## 2020-01-15 DIAGNOSIS — N3281 Overactive bladder: Secondary | ICD-10-CM

## 2020-01-15 NOTE — Progress Notes (Signed)
Referral to urology placed for overactive bladder. myrbetriq prescription was denied

## 2020-01-16 DIAGNOSIS — H5213 Myopia, bilateral: Secondary | ICD-10-CM | POA: Diagnosis not present

## 2020-01-17 ENCOUNTER — Ambulatory Visit: Payer: Medicaid Other | Admitting: Student in an Organized Health Care Education/Training Program

## 2020-01-17 ENCOUNTER — Telehealth: Payer: Self-pay | Admitting: Student in an Organized Health Care Education/Training Program

## 2020-01-17 NOTE — Telephone Encounter (Signed)
Medication was denied by insurance. Please see below for alteratives.

## 2020-01-17 NOTE — Telephone Encounter (Signed)
Patient is calling saying she is needing to discuss some issues with doctor Ouida Sills and would like for her to call her when she can. Thanks

## 2020-01-21 ENCOUNTER — Other Ambulatory Visit: Payer: Self-pay | Admitting: Student in an Organized Health Care Education/Training Program

## 2020-01-21 MED ORDER — HYDROXYZINE HCL 10 MG PO TABS
ORAL_TABLET | ORAL | 0 refills | Status: DC
Start: 2020-01-21 — End: 2020-02-18

## 2020-01-21 NOTE — Progress Notes (Signed)
Called patient to discuss the insurance denial of overactive bladder treatment, urology referral, follow up with counselor and also refilled atarax. reiterated to her again that she should set up appointment with physical therapy and get compression stockings as we have discussed at every appointment.

## 2020-01-22 ENCOUNTER — Ambulatory Visit: Payer: Medicaid Other | Attending: Family Medicine

## 2020-01-22 ENCOUNTER — Other Ambulatory Visit: Payer: Self-pay

## 2020-01-22 DIAGNOSIS — R600 Localized edema: Secondary | ICD-10-CM

## 2020-01-22 DIAGNOSIS — I890 Lymphedema, not elsewhere classified: Secondary | ICD-10-CM | POA: Insufficient documentation

## 2020-01-22 NOTE — Therapy (Signed)
[place], [place], [address] Phone: [phone]   Fax:  [phone]  Physical Therapy Evaluation  Patient Details  Name: [name] MRN: [id number] Date of Birth: 1966-03-03 Referring Provider (PT): Dr, Madison Hickman   Encounter Date: 01/22/2020   PT End of Session - 01/22/20 1246    Visit Number 1    Number of Visits 4    Date for PT Re-Evaluation 02/19/20    PT Start Time 6387    PT Stop Time 1057    PT Time Calculation (min) 42 min    Activity Tolerance Patient tolerated treatment well    Behavior During Therapy [facility] for tasks assessed/performed           Past Medical History:  Diagnosis Date   Allergy    Anxiety    Arthritis    knees,hands, hip  pt. Denies   Cellulitis    Colon polyps 2013   SESSIL SERRATED ADENOMA (ALL FRAGMENTS)   Complication of anesthesia    Lowered BP had to keep pt. overnight   COPD (chronic obstructive pulmonary disease) (HCC)    Depression    Fibroadenosis breast    GERD (gastroesophageal reflux disease)    does not take med   Heart murmur    as child  pt. denies   Hyperlipidemia    no meds - diet controlled   Hypothyroidism    Lymph edema    both legs and feet   Migraine    Plantar fasciitis    Syphilis Treated in early 2012 during hospitalization   Uterine fibroid    Varicose veins     Past Surgical History:  Procedure Laterality Date   COLONOSCOPY  2013   cyst removed     behind left knee and left cheek   ENDOMETRIAL ABLATION     ESOPHAGOGASTRODUODENOSCOPY  2013   normal   EYE SURGERY     bilarteral   HYDRADENITIS EXCISION Right 09/13/2017   Procedure: EXCISION HIDRADENITIS GROIN;  Surgeon: Clovis Riley, MD;  Location: [facility];  Service: General;  Laterality: Right;  LMA   INCISION AND DRAINAGE ABSCESS / HEMATOMA OF BURSA / KNEE / THIGH     right upper thigh - 2008   NORPLANT REMOVAL     right upper  arm - 12/2001   svd     x 2   THYROID SURGERY  2004   removed   TUBAL LIGATION     2003   WISDOM TOOTH EXTRACTION      There were no vitals filed for this visit.    Subjective Assessment - 01/22/20 1019    Subjective Both legs and feet have been swelling.  Was born with swelling.  Wore compression stockings when she was young but hasn't worn them since. Hard to walk because legs are heavy.  has tingling in both calfs that is always there.  Has been hospitalized with infection atleast 8 times, most recently greater than 59yrs ago.    Patient Stated Goals Reduce swelling in legs, learn how to massage legs    Currently in Pain? Yes    Pain Score 7     Pain Orientation Right;Left    Pain Descriptors / Indicators Tingling;Tightness    Pain Type Chronic pain    Pain Onset More than a month ago    Pain Frequency Constant              [facility] PT Assessment -  01/22/20 0001      Assessment   Medical Diagnosis bilateral LE Lymphedema    Referring Provider (PT) Dr, Madison Hickman    Onset Date/Surgical Date 2065/07/15    Hand Dominance Right    Prior Therapy yes   years ago     Precautions   Precautions --   Risk for infection     Restrictions   Weight Bearing Restrictions No    Other Position/Activity Restrictions no      Balance Screen   Has the patient fallen in the past 6 months No    Has the patient had a decrease in activity level because of a fear of falling?  Yes    Is the patient reluctant to leave their home because of a fear of falling?  No      Home Social worker Private residence    Living Arrangements Alone    Available Help at Discharge Other (Comment)   had nurses aid to help with bathing, but not for last 2 mont     Prior Function   Level of Independence Independent    Vocation On disability      Cognition   Overall Cognitive Status Within Functional Limits for tasks assessed      Observation/Other Assessments   Skin Integrity no  skin breakdown      Observation/Other Assessments-Edema    Edema --   non pitting, tender in lower legs     Palpation   Palpation comment --   Tender bilateral feet and lower legs            LYMPHEDEMA/ONCOLOGY QUESTIONNAIRE - 01/22/20 0001      Treatment   Active Chemotherapy Treatment No    Past Chemotherapy Treatment No    Past Radiation Treatment No    Current Hormone Treatment No      What other symptoms do you have   Are you Having Heaviness or Tightness Yes    Is it Hard or Difficult finding clothes that fit Yes    Stemmer Sign No      Right Lower Extremity Lymphedema   20 cm Proximal to Suprapatella 71.7 cm    10 cm Proximal to Suprapatella 62 cm    At Midpatella/Popliteal Crease 46 cm    30 cm Proximal to Floor at Lateral Plantar Foot 47.7 cm    20 cm Proximal to Floor at Lateral Plantar Foot 48.4 1    10  cm Proximal to Floor at Lateral Malleoli 34 cm    5 cm Proximal to 1st MTP Joint 29.3 cm    Across MTP Joint 25.1 cm    Around Proximal Great Toe 9.9 cm      Left Lower Extremity Lymphedema   20 cm Proximal to Suprapatella 71.7 cm    10 cm Proximal to Suprapatella 63.8 cm    At Midpatella/Popliteal Crease 45 cm    30 cm Proximal to Floor at Lateral Plantar Foot 47 cm    20 cm Proximal to Floor at Lateral Plantar Foot 47 cm    10 cm Proximal to Floor at Lateral Malleoli 31.5 cm    5 cm Proximal to 1st MTP Joint 29.3 cm    Across MTP Joint 26 cm    Around Proximal Great Toe 9.7 cm                   [facility]  Lymphedema Life Impact Scale  Total Score 89.71 %      Objective measurements completed on examination: See above findings.                    PT Long Term Goals - 01/22/20 1253      PT LONG TERM GOAL #1   Title Pt will be able to apply compression bandaging independently    Time 4    Period Weeks    Status New    Target Date 02/19/20      PT  LONG TERM GOAL #2   Title Pt will have 3 cm reduction in edema at 20 and 30 cm prox to floor    Time 4    Period Weeks    Status New      PT LONG TERM GOAL #3   Title Pt will be fit for compression stockings and be independent in donning/doffing    Time 4    Period Weeks    Status New      PT LONG TERM GOAL #4   Title Pt will be independent with MLD to decrease edema    Time 4    Period Weeks    Status New    Target Date 02/19/20                  Plan - 01/22/20 1248    Clinical Impression Statement 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  She has not worn compression for years and she has been hospitalized greater than 5 times over the years with infections.  She lives alone and until recently had an aid to help her with bathing and cooking.  She has significant LBP as well.    Personal Factors and Comorbidities Comorbidity 3+    Comorbidities lymphedema, recurrent infections, back pain, lives alone    Examination-Activity Limitations Stand;Squat;Sleep;Locomotion Level;Bathing;Bend    Stability/Clinical Decision Making Stable/Uncomplicated    Clinical Decision Making Low    Rehab Potential Good    PT Frequency 1x / week    PT Duration 4 weeks    PT Treatment/Interventions Therapeutic exercise;Manual techniques;Patient/family education;Orthotic Fit/Training;Manual lymph drainage;Compression bandaging;Vasopneumatic Device    PT Next Visit Plan Consider compression bandaging to knees and instruct pt if possible    Recommended Other Services flexi touch, compression garments when ready    Consulted and Agree with Plan of Care Patient           Patient will benefit from skilled therapeutic intervention in order to improve the following deficits and impairments:  Decreased mobility, Decreased knowledge of precautions, Decreased endurance, Increased edema, Difficulty walking  Visit Diagnosis: Bilateral leg edema  Lymphedema   Check all possible  CPT codes: 97110- Therapeutic Exercise, 97140 - Manual Therapy and 97535 - Self Care     Problem List Patient Active Problem List   Diagnosis Date Noted   Overactive bladder 01/01/2020   At risk for polypharmacy 12/05/2019   Vagina bleeding 11/02/2019   Chronic pain 07/25/2019   Menopause 03/21/2019   Chronic diarrhea 01/08/2019   Restless legs 02/23/2016   Spondylosis of lumbar region without myelopathy or radiculopathy 12/13/2014   GERD (gastroesophageal reflux disease) 06/24/2011   Obesity (BMI 30-39.9) 01/22/2011   Anxiety and depression 10/24/2009   Hyperlipidemia 01/05/2008   Lymphedema 01/04/2007   HIDRADENITIS SUPPURATIVA 08/17/2006   Hypothyroidism 05/05/2006   History of tobacco use 05/05/2006   Episodic mood disorder ([place]) 05/05/2006    Elsie Ra Lynsey Ange 01/22/2020,  12:57 PM  [facility] [place], [place], [address] Phone: [phone]   Fax:  [phone]  Name: [name] MRN: [id number] Date of Birth: 09/01/1965 Cheral Almas, PT 01/22/20 7:31 PM

## 2020-01-28 ENCOUNTER — Encounter: Payer: Self-pay | Admitting: Student in an Organized Health Care Education/Training Program

## 2020-01-28 ENCOUNTER — Other Ambulatory Visit: Payer: Self-pay

## 2020-01-28 ENCOUNTER — Ambulatory Visit (INDEPENDENT_AMBULATORY_CARE_PROVIDER_SITE_OTHER): Payer: Medicaid Other | Admitting: Student in an Organized Health Care Education/Training Program

## 2020-01-28 VITALS — BP 110/70 | HR 71 | Ht 68.0 in | Wt 253.4 lb

## 2020-01-28 DIAGNOSIS — F39 Unspecified mood [affective] disorder: Secondary | ICD-10-CM | POA: Diagnosis not present

## 2020-01-28 DIAGNOSIS — H6591 Unspecified nonsuppurative otitis media, right ear: Secondary | ICD-10-CM

## 2020-01-28 DIAGNOSIS — H9201 Otalgia, right ear: Secondary | ICD-10-CM | POA: Diagnosis not present

## 2020-01-28 DIAGNOSIS — R221 Localized swelling, mass and lump, neck: Secondary | ICD-10-CM | POA: Diagnosis not present

## 2020-01-28 MED ORDER — FLUTICASONE PROPIONATE 50 MCG/ACT NA SUSP: 1.0000 | Freq: Every day | NASAL | 12 refills | Status: DC

## 2020-01-28 NOTE — Patient Instructions (Addendum)
It was a pleasure to see you today!  To summarize our discussion for this visit:  For your ear effusion- please do the valsalva maneuver we discussed several times per day as well as the nose spray  I have sent in a referral to ENT to have your hearing tested and look at your ear drum.  Let me know if you want to discuss urinary problems again in the future  For your throat- you have a swollen lymph node. Let me know if it is not improved in a few weeks  I will let Dr. Hartford Poli know that you are still interested in meeting with her and she can reach out to set up an appointment  I will review your medications to see what needs to be refilled by this afternoon   Call the clinic at [phone] if your symptoms worsen or you have any concerns.   Thank you for allowing me to take part in your care,  Dr. Doristine Mango

## 2020-01-28 NOTE — Progress Notes (Signed)
    SUBJECTIVE:   CHIEF COMPLAINT / HPI:  F/u urinary urgency with incontinence   Urinary Incontinence: wants to drop this topic for now as its well controlled and not bothering her. we will discuss at future appointment if it becomes bothersome again.   Right ear- 4 months started having uncomfortable feeling on right side only. Thought she felt something fly into it at an event. Got water in it in the shower. Denies drainage. Initially denies hearing difficulties but then endorses that she does think she has multiple times of needing people to speak louder. Ear has fullness sensation. Can feel like fluid or water. No new balance issues.  Both nostrils are patent. Has not tried anything for it. Saw an ENT about 6 years ago for separate problem with swallowing. Endorses q-tip use.  Swelling in throat- left side for a couple weeks. Its painful. No recent illness. Sometimes growing seemingly due to position of head. No skin changes. Causes difficulty with swallowing solid foods but liquids are fine. Breathing is unaffected.  BID using rescue inhaler which does not help. Has quit smoking other than 4 cigarettes per month.   Counseling- has been avoiding counseling as she is concerned that talking about her mother passing will bring up bad memories and worsen her mood. She is also concerned that what she talks about with the counselor will not be private. She would still be interested in therapy. Does not want to be seen anywhere but [facility].  OBJECTIVE:   BP 110/70   Pulse 71   Ht 5\' 8"  (1.727 m)   Wt 253 lb 6.4 oz (114.9 kg)   SpO2 99%   BMI 38.53 kg/m   General: NAD, pleasant, able to participate in exam Head: [place]/AT.   Eyes:  EOMI, PERRL.   Ears:  External ears WNL, left TM opaque with possible scar tissue vs sclerosis. Right ear is tender to otoscopic exam. No erythema or drainage. Positive for clear effusion behind TM.  Nose:  Septum midline  Mouth:  MMM, tonsils non-erythematous,  non-edematous.  Neck: left sided  Cardiac: RRR, normal heart sounds, no murmurs. 2+ radial and PT pulses bilaterally Respiratory: CTAB, normal effort, No wheezes, rales or rhonchi Abdomen: soft, nontender, nondistended, no hepatic or splenomegaly, +BS Skin: warm and dry, no rashes noted Neuro: alert and oriented, no focal deficits Psych: Normal affect and mood  ASSESSMENT/PLAN:   Ear pain Effusion present Right. Nares patent.  Recommended allergy medication as well as flonase Incidental finding on exam of opaque TM on left.  Referring to ENT for hearing evaluation and exam of TMs  Neck mass Positive for cervical lymphadenopathy.  Possibly related to ear pathology and can be improved with sinus treatment as described above.  Gave patient instructions to return if not improving in the next few weeks to consider further workup but will likely subside  Episodic mood disorder ([place]) Has been avoiding therapy with Dr. Hartford Poli but is still interested in counseling Reaching out to Dr. Hartford Poli to schedule her for counseling appointment again     Richarda Osmond, [place]

## 2020-01-29 ENCOUNTER — Telehealth: Payer: Self-pay | Admitting: Psychology

## 2020-01-29 DIAGNOSIS — H9209 Otalgia, unspecified ear: Secondary | ICD-10-CM | POA: Insufficient documentation

## 2020-01-29 NOTE — Telephone Encounter (Signed)
Scheduled pt appt for 12/2 at 10am

## 2020-01-29 NOTE — Assessment & Plan Note (Signed)
Has been avoiding therapy with Dr. Hartford Poli but is still interested in counseling Reaching out to Dr. Hartford Poli to schedule her for counseling appointment again

## 2020-01-29 NOTE — Assessment & Plan Note (Signed)
Positive for cervical lymphadenopathy.  Possibly related to ear pathology and can be improved with sinus treatment as described above.  Gave patient instructions to return if not improving in the next few weeks to consider further workup but will likely subside

## 2020-01-29 NOTE — Telephone Encounter (Signed)
-----   Message from Richarda Osmond, DO sent at 01/28/2020  2:32 PM EST ----- Can you please reach out to this patient to try to schedule counseling again? She is nervous about doing it because it will bring up sad memories and worries the information won't be private. But, she really wants to talk with you.

## 2020-01-29 NOTE — Assessment & Plan Note (Signed)
Effusion present Right. Nares patent.  Recommended allergy medication as well as flonase Incidental finding on exam of opaque TM on left.  Referring to ENT for hearing evaluation and exam of TMs

## 2020-01-30 ENCOUNTER — Other Ambulatory Visit: Payer: Self-pay

## 2020-01-30 ENCOUNTER — Ambulatory Visit: Payer: Medicaid Other

## 2020-01-30 DIAGNOSIS — R600 Localized edema: Secondary | ICD-10-CM

## 2020-01-30 DIAGNOSIS — I890 Lymphedema, not elsewhere classified: Secondary | ICD-10-CM | POA: Diagnosis not present

## 2020-01-30 NOTE — Therapy (Signed)
[place], [place], [address] Phone: [phone]   Fax:  [phone]  Physical Therapy Treatment  Patient Details  Name: [name] MRN: [id number] Date of Birth: [birth year] Referring Provider (PT): Dr, Madison Hickman   Encounter Date: 01/30/2020   PT End of Session - 01/30/20 1132    Visit Number 2    Number of Visits 4    Date for PT Re-Evaluation 02/19/20    PT Start Time 0908    PT Stop Time 1008    PT Time Calculation (min) 60 min    Activity Tolerance Patient tolerated treatment well    Behavior During Therapy [facility] for tasks assessed/performed           Past Medical History:  Diagnosis Date  . Allergy   . Anxiety   . Arthritis    knees,hands, hip  pt. Denies  . Cellulitis   . Colon polyps 2013   SESSIL SERRATED ADENOMA (ALL FRAGMENTS)  . Complication of anesthesia    Lowered BP had to keep pt. overnight  . COPD (chronic obstructive pulmonary disease) ([place])   . Depression   . Fibroadenosis breast   . GERD (gastroesophageal reflux disease)    does not take med  . Heart murmur    as child  pt. denies  . Hyperlipidemia    no meds - diet controlled  . Hypothyroidism   . Lymph edema    both legs and feet  . Migraine   . Plantar fasciitis   . Syphilis Treated in early 2012 during hospitalization  . Uterine fibroid   . Varicose veins     Past Surgical History:  Procedure Laterality Date  . COLONOSCOPY  2013  . cyst removed     behind left knee and left cheek  . ENDOMETRIAL ABLATION    . ESOPHAGOGASTRODUODENOSCOPY  2013   normal  . EYE SURGERY     bilarteral  . HYDRADENITIS EXCISION Right 09/13/2017   Procedure: EXCISION HIDRADENITIS GROIN;  Surgeon: Clovis Riley, MD;  Location: [facility];  Service: General;  Laterality: Right;  LMA  . INCISION AND DRAINAGE ABSCESS / HEMATOMA OF BURSA / KNEE / THIGH     right upper thigh - 2008  . NORPLANT REMOVAL     right upper arm  - 12/2001  . svd     x 2  . THYROID SURGERY  2004   removed  . TUBAL LIGATION     2003  . WISDOM TOOTH EXTRACTION      There were no vitals filed for this visit.   Subjective Assessment - 01/30/20 0913    Subjective Nothing new since I was here last for the evaluation. Just want to get these legs down.    Patient Stated Goals Reduce swelling in legs, learn how to massage legs    Currently in Pain? Yes    Pain Score 7     Pain Location Leg    Pain Orientation Right;Left    Pain Descriptors / Indicators Heaviness    Pain Type Chronic pain    Pain Onset More than a month ago    Pain Frequency Constant    Aggravating Factors  standing up due to gravity    Pain Relieving Factors elevating legs                       Outpatient Rehab from 01/22/2020 in [place]  Lymphedema  Life Impact Scale Total Score 89.71 %            [facility] Adult PT Treatment/Exercise - 01/30/20 0001      Manual Therapy   Manual Therapy Compression Bandaging    Compression Bandaging Instructed pt while performing for bil LE's: coconut oil applied, then donned med size TG soft to bil LE's below knees then bandaging as follows: Elastomull to toes 1-4, Artiflex x1 from foot-knee, then 1-6, 1-8, 1-10 and 1-12 cm short stretch compression bandages from foot-knee. Also issued handout and referred to each step on that while performing to further educate pt.                   PT Education - 01/30/20 1136    Education Details Self Compression bandaging to bil LE's below knee; remedial exercises to perform while wearing bandages (ankle pump, LAQ and marching) and to remove bandages if she starts to have tingling in toes if remedial exs do not relieve these symptoms    Person(s) Educated Patient    Methods Explanation;Demonstration;Handout    Comprehension Verbalized understanding;Need further instruction               PT Long Term Goals - 01/22/20  1253      PT LONG TERM GOAL #1   Title Pt will be able to apply compression bandaging independently    Time 4    Period Weeks    Status New    Target Date 02/19/20      PT LONG TERM GOAL #2   Title Pt will have 3 cm reduction in edema at 20 and 30 cm prox to floor    Time 4    Period Weeks    Status New      PT LONG TERM GOAL #3   Title Pt will be fit for compression stockings and be independent in donning/doffing    Time 4    Period Weeks    Status New      PT LONG TERM GOAL #4   Title Pt will be independent with MLD to decrease edema    Time 4    Period Weeks    Status New    Target Date 02/19/20                 Plan - 01/30/20 1133    Clinical Impression Statement Pt returns to physical therapy today to learn how to self bandage to begin managing her long term lymphedema. She was instructed in this today while therapist donned bandages to bil LE's from feet to knees. She seemed to be able to verbalize good initial understanding but will benefit from further review of this, and also for [place] to be able to begin manual lymph drainage and instruct pt in same. She was also educated in importance of remedial exercises to perform while in bandages, she was able to return demo of each.    Personal Factors and Comorbidities Comorbidity 3+    Comorbidities lymphedema, recurrent infections, back pain, lives alone    Examination-Activity Limitations Stand;Squat;Sleep;Locomotion Level;Bathing;Bend    Stability/Clinical Decision Making Stable/Uncomplicated    Rehab Potential Good    PT Frequency 1x / week    PT Duration 4 weeks    PT Treatment/Interventions Therapeutic exercise;Manual techniques;Patient/family education;Orthotic Fit/Training;Manual lymph drainage;Compression bandaging;Vasopneumatic Device    PT Next Visit Plan Review compression bandaging to knees and cont this, how did pt do with self bandaging? Instruct boyfriend if he can come.  Begin manual lymph drainage and  instruct in same.    PT Home Exercise Plan Remedial exercises to perform while wearing bandages.    Consulted and Agree with Plan of Care Patient           Patient will benefit from skilled therapeutic intervention in order to improve the following deficits and impairments:  Decreased mobility, Decreased knowledge of precautions, Decreased endurance, Increased edema, Difficulty walking  Visit Diagnosis: Bilateral leg edema  Lymphedema     Problem List Patient Active Problem List   Diagnosis Date Noted  . Ear pain 01/29/2020  . Overactive bladder 01/01/2020  . At risk for polypharmacy 12/05/2019  . Vagina bleeding 11/02/2019  . Chronic pain 07/25/2019  . Menopause 03/21/2019  . Chronic diarrhea 01/08/2019  . Neck mass 01/08/2019  . Restless legs 02/23/2016  . 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 GERD (gastroesophageal reflux disease) 06/24/2011  . Obesity (BMI 30-39.9) 01/22/2011  . Anxiety and depression 10/24/2009  . Hyperlipidemia 01/05/2008  . Lymphedema 01/04/2007  . HIDRADENITIS SUPPURATIVA 08/17/2006  . Hypothyroidism 05/05/2006  . History of tobacco use 05/05/2006  . Episodic mood disorder ([place]) 05/05/2006    Otelia Limes, PTA 01/30/2020, 11:40 AM  [place], [place], [address] Phone: [phone]   Fax:  [phone]  Name: [name] MRN: [id number] Date of Birth: [birth year]

## 2020-02-01 ENCOUNTER — Other Ambulatory Visit: Payer: Self-pay | Admitting: Student in an Organized Health Care Education/Training Program

## 2020-02-04 ENCOUNTER — Ambulatory Visit: Payer: Medicaid Other | Admitting: Student in an Organized Health Care Education/Training Program

## 2020-02-05 ENCOUNTER — Ambulatory Visit: Payer: Medicaid Other

## 2020-02-05 ENCOUNTER — Telehealth: Payer: Self-pay

## 2020-02-05 ENCOUNTER — Other Ambulatory Visit: Payer: Self-pay

## 2020-02-05 DIAGNOSIS — R600 Localized edema: Secondary | ICD-10-CM

## 2020-02-05 DIAGNOSIS — I890 Lymphedema, not elsewhere classified: Secondary | ICD-10-CM | POA: Diagnosis not present

## 2020-02-05 NOTE — Patient Instructions (Signed)
Start with circles near neck above collarbones 10 times each side.  Cancer Rehab [phone]  Deep Effective Breath   Standing, sitting, or laying down place both hands on the belly. Take a deep breath IN, expanding the belly; then breath OUT, contracting the belly. Repeat __5__ times. Do __2-3__ sessions per day and before each self massage.  Inguinal Nodes to Axilla - Clear   On involved side, at armpit, make _5__ in-place circles. Then from hip proceed in sections to armpit with stationary circles or pumps _5_ times, this is your pathway. Do _1__ time per day.  Copyright  VHI. All rights reserved.  LEG: Knee to Hip - Clear   Pump up outer thigh of involved leg from knee to outer hip. Then do stationary circles from inner to outer thigh, then do outer thigh again. Next, interlace fingers behind knee IF ABLE and make in-place circles. Do _5_ times of each sequence.  Do _1__ time per day.  Copyright  VHI. All rights reserved.  LEG: Ankle to Hip Sweep   Hands on sides of ankle of involved leg, pump _5__ times up both sides of lower leg, then retrace steps up outer thigh to hip as before and back to pathway. Do _2-3_ times. Do __1_ time per day.  Copyright  VHI. All rights reserved.  FOOT: Dorsum of Foot and Toes Massage   One hand on top of foot make _5_ stationary circles or pumps, then either on top of toes or each individual toe do _5_ pumps. Then retrace all steps pumping back up both sides of lower leg, outer thigh, and then pathway. Finish with what you started with, _5_ circles at involved side arm pit. All _2-3_ times at each sequence. Do _1__ time per day.

## 2020-02-05 NOTE — Telephone Encounter (Signed)
Patient called nurse line multiple times in regards to PA for Oxycodone. PA for oxycodone completed via covermymeds. Patient contacted and informed of completed PA process and time frame to wait for response. Patient then stated that the pharmacy told her the medication no longer needed a PA.

## 2020-02-05 NOTE — Therapy (Signed)
[place], [place], [address] Phone: [phone]   Fax:  [phone]  Physical Therapy Treatment  Patient Details  Name: [name] MRN: [id number] Date of Birth: 04-30-1965 Referring Provider (PT): Dr, Madison Hickman   Encounter Date: 02/05/2020   PT End of Session - 02/05/20 1050    Visit Number 3    Number of Visits 4    Date for PT Re-Evaluation 02/19/20    PT Start Time 0814   pt arrived late   PT Stop Time 0910    PT Time Calculation (min) 56 min    Activity Tolerance Patient tolerated treatment well    Behavior During Therapy [facility] for tasks assessed/performed           Past Medical History:  Diagnosis Date  . Allergy   . Anxiety   . Arthritis    knees,hands, hip  pt. Denies  . Cellulitis   . Colon polyps 2013   SESSIL SERRATED ADENOMA (ALL FRAGMENTS)  . Complication of anesthesia    Lowered BP had to keep pt. overnight  . COPD (chronic obstructive pulmonary disease) ([place])   . Depression   . Fibroadenosis breast   . GERD (gastroesophageal reflux disease)    does not take med  . Heart murmur    as child  pt. denies  . Hyperlipidemia    no meds - diet controlled  . Hypothyroidism   . Lymph edema    both legs and feet  . Migraine   . Plantar fasciitis   . Syphilis Treated in early 2012 during hospitalization  . Uterine fibroid   . Varicose veins     Past Surgical History:  Procedure Laterality Date  . COLONOSCOPY  2013  . cyst removed     behind left knee and left cheek  . ENDOMETRIAL ABLATION    . ESOPHAGOGASTRODUODENOSCOPY  2013   normal  . EYE SURGERY     bilarteral  . HYDRADENITIS EXCISION Right 09/13/2017   Procedure: EXCISION HIDRADENITIS GROIN;  Surgeon: Clovis Riley, MD;  Location: [facility];  Service: General;  Laterality: Right;  LMA  . INCISION AND DRAINAGE ABSCESS / HEMATOMA OF BURSA / KNEE / THIGH     right upper thigh - 2008  . NORPLANT REMOVAL       right upper arm - 12/2001  . svd     x 2  . THYROID SURGERY  2004   removed  . TUBAL LIGATION     2003  . WISDOM TOOTH EXTRACTION      There were no vitals filed for this visit.   Subjective Assessment - 02/05/20 0817    Subjective I've been bandaging every day and my legs are smaller and softer than they've been in a long time.    Patient Stated Goals Reduce swelling in legs, learn how to massage legs    Currently in Pain? No/denies                       [facility]  Lymphedema Life Impact Scale Total Score 89.71 %            [facility] Adult PT Treatment/Exercise - 02/05/20 0001      Manual Therapy   Manual Therapy Manual Lymphatic Drainage (MLD)    Manual Lymphatic Drainage (MLD) In Supine: Short neck, 5 diaphragmatic breaths, Lt axillary nodes, Lt inguino-axillary anastomosis, then Lt  LE working from proximal to distal then retracing all steps and beginning to instruct pt in this throughout, then did same for Rt LE.    Compression Bandaging --   pt did not bring bandages back so unable to wrap today                 PT Education - 02/05/20 1146    Education Details Self MLD to bil LE's    Person(s) Educated Patient    Methods Explanation;Demonstration;Verbal cues;Handout    Comprehension Verbalized understanding;Returned demonstration;Tactile cues required;Need further instruction               PT Long Term Goals - 01/22/20 1253      PT LONG TERM GOAL #1   Title Pt will be able to apply compression bandaging independently    Time 4    Period Weeks    Status New    Target Date 02/19/20      PT LONG TERM GOAL #2   Title Pt will have 3 cm reduction in edema at 20 and 30 cm prox to floor    Time 4    Period Weeks    Status New      PT LONG TERM GOAL #3   Title Pt will be fit for compression stockings and be independent in donning/doffing    Time 4    Period Weeks     Status New      PT LONG TERM GOAL #4   Title Pt will be independent with MLD to decrease edema    Time 4    Period Weeks    Status New    Target Date 02/19/20                 Plan - 02/05/20 1052    Clinical Impression Statement Pt arrives late to session today and did not bring bandages, though she reports she has been bandaging daily at home since being instructed in this last session. Instructed her in manual lymph drainage to bil LE's today while performing. Pt was able to verbalize good understanding of sequence by end of session and handout was issued as well. Pt does not want to increase freq to focus on active phase of treatment and wants to be measured for compression garments ASAP. So placed her on the schedule for 02/12/20 with max instructions to cont self bandaging and perform self MLD daily to try to attain max circumferential reductions before then. Pt verbalized understanding.    Personal Factors and Comorbidities Comorbidity 3+    Comorbidities lymphedema, recurrent infections, back pain, lives alone    Examination-Activity Limitations Stand;Squat;Sleep;Locomotion Level;Bathing;Bend    Stability/Clinical Decision Making Stable/Uncomplicated    Rehab Potential Good    PT Frequency 1x / week    PT Duration 4 weeks    PT Treatment/Interventions Therapeutic exercise;Manual techniques;Patient/family education;Orthotic Fit/Training;Manual lymph drainage;Compression bandaging;Vasopneumatic Device    PT Next Visit Plan Review compression bandaging to knees and cont this, how did pt do with self bandaging? Instruct boyfriend if he can come. Cont manual lymph drainage and review this with pt. Pt to be measured for compression garments on 02/12/20.    PT Home Exercise Plan Remedial exercises to perform while wearing bandages, self MLD daily    Recommended Other Services Sent demographics to [facility] and [facility] today    Consulted and Agree with Plan of Care Patient            Patient will benefit  from skilled therapeutic intervention in order to improve the following deficits and impairments:  Decreased mobility, Decreased knowledge of precautions, Decreased endurance, Increased edema, Difficulty walking  Visit Diagnosis: Bilateral leg edema  Lymphedema     Problem List Patient Active Problem List   Diagnosis Date Noted  . Ear pain 01/29/2020  . Overactive bladder 01/01/2020  . At risk for polypharmacy 12/05/2019  . Vagina bleeding 11/02/2019  . Chronic pain 07/25/2019  . Menopause 03/21/2019  . Chronic diarrhea 01/08/2019  . Neck mass 01/08/2019  . Restless legs 02/23/2016  . 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 GERD (gastroesophageal reflux disease) 06/24/2011  . Obesity (BMI 30-39.9) 01/22/2011  . Anxiety and depression 10/24/2009  . Hyperlipidemia 01/05/2008  . Lymphedema 01/04/2007  . HIDRADENITIS SUPPURATIVA 08/17/2006  . Hypothyroidism 05/05/2006  . History of tobacco use 05/05/2006  . Episodic mood disorder ([place]) 05/05/2006    Otelia Limes, PTA 02/05/2020, 11:48 AM  [place], [place], [address] Phone: [phone]   Fax:  [phone]  Name: [name] MRN: [id number] Date of Birth: 1965-05-22

## 2020-02-07 ENCOUNTER — Encounter: Payer: Medicaid Other | Admitting: Psychology

## 2020-02-07 ENCOUNTER — Telehealth: Payer: Self-pay | Admitting: Psychology

## 2020-02-07 ENCOUNTER — Other Ambulatory Visit: Payer: Self-pay

## 2020-02-07 NOTE — Telephone Encounter (Signed)
Pt requested a phone call regarding understanding therapy treatment. Pt shared her hx of depression and experiences with loss.  Pt reported panic attacks.  Pt and Dr. Hartford Poli scheduled in person appt for 12/22 at 1115.   Erlinda Hong, PhD., LMFT-A

## 2020-02-13 ENCOUNTER — Other Ambulatory Visit: Payer: Self-pay

## 2020-02-13 ENCOUNTER — Ambulatory Visit: Payer: Medicaid Other | Attending: Family Medicine

## 2020-02-13 DIAGNOSIS — I890 Lymphedema, not elsewhere classified: Secondary | ICD-10-CM | POA: Diagnosis not present

## 2020-02-13 DIAGNOSIS — R600 Localized edema: Secondary | ICD-10-CM | POA: Diagnosis not present

## 2020-02-13 NOTE — Addendum Note (Signed)
Addended by: Claris Pong on: 02/13/2020 01:06 PM   Modules accepted: Orders

## 2020-02-13 NOTE — Therapy (Addendum)
[place], [place], [address] Phone: [phone]   Fax:  [phone]  Physical Therapy Treatment  Patient Details  Name: [name] MRN: [id number] Date of Birth: 1965-06-14 Referring Provider (PT): Dr, Madison Hickman   Encounter Date: 02/13/2020   PT End of Session - 02/13/20 1108    Visit Number 4    Number of Visits 8    Date for PT Re-Evaluation 03/12/20    PT Start Time 1005    PT Stop Time 1102    PT Time Calculation (min) 57 min    Activity Tolerance Patient tolerated treatment well    Behavior During Therapy [facility] for tasks assessed/performed           Past Medical History:  Diagnosis Date  . Allergy   . Anxiety   . Arthritis    knees,hands, hip  pt. Denies  . Cellulitis   . Colon polyps 2013   SESSIL SERRATED ADENOMA (ALL FRAGMENTS)  . Complication of anesthesia    Lowered BP had to keep pt. overnight  . COPD (chronic obstructive pulmonary disease) ([place])   . Depression   . Fibroadenosis breast   . GERD (gastroesophageal reflux disease)    does not take med  . Heart murmur    as child  pt. denies  . Hyperlipidemia    no meds - diet controlled  . Hypothyroidism   . Lymph edema    both legs and feet  . Migraine   . Plantar fasciitis   . Syphilis Treated in early 2012 during hospitalization  . Uterine fibroid   . Varicose veins     Past Surgical History:  Procedure Laterality Date  . COLONOSCOPY  2013  . cyst removed     behind left knee and left cheek  . ENDOMETRIAL ABLATION    . ESOPHAGOGASTRODUODENOSCOPY  2013   normal  . EYE SURGERY     bilarteral  . HYDRADENITIS EXCISION Right 09/13/2017   Procedure: EXCISION HIDRADENITIS GROIN;  Surgeon: Clovis Riley, MD;  Location: [facility];  Service: General;  Laterality: Right;  LMA  . INCISION AND DRAINAGE ABSCESS / HEMATOMA OF BURSA / KNEE / THIGH     right upper thigh - 2008  . NORPLANT REMOVAL     right upper arm  - 12/2001  . svd     x 2  . THYROID SURGERY  2004   removed  . TUBAL LIGATION     2003  . WISDOM TOOTH EXTRACTION      There were no vitals filed for this visit.   Subjective Assessment - 02/13/20 1012    Subjective I got measured for my new flat knit knee highs yesterday. Been wrapping my legs every day.    Patient Stated Goals Reduce swelling in legs, learn how to massage legs    Currently in Pain? No/denies                 LYMPHEDEMA/ONCOLOGY QUESTIONNAIRE - 02/13/20 0001      Right Lower Extremity Lymphedema   20 cm Proximal to Suprapatella 68.3 cm    10 cm Proximal to Suprapatella 60.2 cm    At Midpatella/Popliteal Crease 43 cm    30 cm Proximal to Floor at Lateral Plantar Foot 43.7 cm    20 cm Proximal to Floor at Lateral Plantar Foot 40.2 1    10  cm Proximal to Floor at Lateral Malleoli 32.9 cm    5  cm Proximal to 1st MTP Joint 27.9 cm    Across MTP Joint 24.8 cm    Around Proximal Great Toe 9.7 cm      Left Lower Extremity Lymphedema   20 cm Proximal to Suprapatella 71 cm    10 cm Proximal to Suprapatella 60.7 cm    At Midpatella/Popliteal Crease 46 cm    30 cm Proximal to Floor at Lateral Plantar Foot 40.2 cm    20 cm Proximal to Floor at Lateral Plantar Foot 45.3 cm    10 cm Proximal to Floor at Lateral Malleoli 27.9 cm    5 cm Proximal to 1st MTP Joint 28.6 cm    Across MTP Joint 24.9 cm    Around Proximal Great Toe 9.1 cm                [facility]patient [facility]22/[facility]  Lymphedema Life Impact Scale Total Score 89.71 %            [facility] Adult PT Treatment/Exercise - 02/13/20 0001      Manual Therapy   Manual Lymphatic Drainage (MLD) In Supine: Short neck, 5 diaphragmatic breaths, Rt axillary nodes, Rt inguino-axillary anastomosis, then Rt LE working from proximal to distal then retracing all steps    Compression Bandaging Coconut oil applied, then donned med size TG soft to bil LE's below  knees then bandaging as follows: Elastomull to toes 1-4, Artiflex x1 from foot-knee, then 1-6, 1-8, 1-10 and 1-12 cm short stretch compression bandages from foot-knee.                        PT Long Term Goals - 02/13/20 1231      PT LONG TERM GOAL #1   Title Pt will be able to apply compression bandaging independently    Baseline Pt still requires min cuing for correct technique but is showing good improvement with this as is evidenced by her circumferential reductions-02/13/20    Status Partially Met      PT LONG TERM GOAL #2   Title Pt will have 3 cm reduction in edema at 20 and 30 cm prox to floor    Baseline Rt 20 cm 48.4, 30 cm 47.7 and Lt 20 cm 47, 30 cm 47; Rt 20 cm 40.6 (-7.8 cm), 30 cm 43.7 (-4 cm) and Lt 20 cm 45.3 (-1.7 cm), 30 cm 40.2 (6.8 cm) - 02/13/20    Status Partially Met      PT LONG TERM GOAL #3   Title Pt will be fit for compression stockings and be independent in donning/doffing    Baseline Pt was measured for compression garments yesterday-02/13/20    Status Partially Met      PT LONG TERM GOAL #4   Title Pt will be independent with MLD to decrease edema    Baseline Pt reports not doing this as often due to wearing bandages daily and can benefit from further review of this-02/13/20    Status On-going                 Plan - 02/13/20 1228    Clinical Impression Statement Pt comes in witout bandages in reporting she washed them. She also reports she has been bandaging herself daily. Her circumference measurements were greatly reduced, on Rt >Lt LE and her tissue is supple now with much less firmness. [name] was measured for new bil knee high flat knit garments yesterday so  will continue to see her until those arrive to prevent reflux of lymphedema and assess her self MLD and bandaging techniques as well. Continued with complete decongestive therapy to bil LE's and reviewed bandaging technique while appyling these today, answering pts questions  throughout. She is able to verbalize good understanding of sequence. Pt has alos heard from [employer] and was told her pump has been ordered.    Personal Factors and Comorbidities Comorbidity 3+    Comorbidities lymphedema, recurrent infections, back pain, lives alone    Examination-Activity Limitations Stand;Squat;Sleep;Locomotion Level;Bathing;Bend    Stability/Clinical Decision Making Stable/Uncomplicated    Rehab Potential Good    PT Frequency 1x / week    PT Treatment/Interventions Therapeutic exercise;Manual techniques;Patient/family education;Orthotic Fit/Training;Manual lymph drainage;Compression bandaging;Vasopneumatic Device    PT Next Visit Plan Review compression bandaging to knees and cont this Instruct boyfriend if he can come. Cont manual lymph drainage and review this with pt. Pt to be measured for compression garments on 02/12/20.    PT Home Exercise Plan Remedial exercises to perform while wearing bandages, self MLD daily    Consulted and Agree with Plan of Care Patient           Patient will benefit from skilled therapeutic intervention in order to improve the following deficits and impairments:  Decreased mobility, Decreased knowledge of precautions, Decreased endurance, Increased edema, Difficulty walking  Visit Diagnosis: Bilateral leg edema  Lymphedema     Problem List Patient Active Problem List   Diagnosis Date Noted  . Ear pain 01/29/2020  . Overactive bladder 01/01/2020  . At risk for polypharmacy 12/05/2019  . Vagina bleeding 11/02/2019  . Chronic pain 07/25/2019  . Menopause 03/21/2019  . Chronic diarrhea 01/08/2019  . Neck mass 01/08/2019  . Restless legs 02/23/2016  . 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 GERD (gastroesophageal reflux disease) 06/24/2011  . Obesity (BMI 30-39.9) 01/22/2011  . Anxiety and depression 10/24/2009  . Hyperlipidemia 01/05/2008  . Lymphedema 01/04/2007  . HIDRADENITIS  SUPPURATIVA 08/17/2006  . Hypothyroidism 05/05/2006  . History of tobacco use 05/05/2006  . Episodic mood disorder ([place]) 05/05/2006    Otelia Limes, PTA 02/13/2020, 12:40 PM  [place], [place], [address] Phone: [phone]   Fax:  [phone]  Name: REVELLA SHELTON MRN: [id number] Date of Birth: 1965-05-15 Check all possible CPT codes: 73567- Therapeutic Exercise, [phone]- Neuro Re-education, [phone] - Manual Therapy, [phone] - Self Care and [place]  Cheral Almas, PT 02/13/20 1:03 PM

## 2020-02-15 ENCOUNTER — Other Ambulatory Visit: Payer: Self-pay | Admitting: Student in an Organized Health Care Education/Training Program

## 2020-02-15 DIAGNOSIS — E890 Postprocedural hypothyroidism: Secondary | ICD-10-CM

## 2020-02-20 ENCOUNTER — Ambulatory Visit: Payer: Medicaid Other

## 2020-02-21 ENCOUNTER — Other Ambulatory Visit: Payer: Self-pay | Admitting: Student in an Organized Health Care Education/Training Program

## 2020-02-21 DIAGNOSIS — E890 Postprocedural hypothyroidism: Secondary | ICD-10-CM

## 2020-02-25 ENCOUNTER — Ambulatory Visit: Payer: Medicaid Other

## 2020-02-27 ENCOUNTER — Ambulatory Visit (INDEPENDENT_AMBULATORY_CARE_PROVIDER_SITE_OTHER): Payer: Medicaid Other | Admitting: Psychology

## 2020-02-27 ENCOUNTER — Ambulatory Visit (INDEPENDENT_AMBULATORY_CARE_PROVIDER_SITE_OTHER): Payer: Medicaid Other | Admitting: Student in an Organized Health Care Education/Training Program

## 2020-02-27 ENCOUNTER — Other Ambulatory Visit: Payer: Self-pay

## 2020-02-27 DIAGNOSIS — F32A Depression, unspecified: Secondary | ICD-10-CM

## 2020-02-27 DIAGNOSIS — I890 Lymphedema, not elsewhere classified: Secondary | ICD-10-CM | POA: Diagnosis not present

## 2020-02-27 DIAGNOSIS — F419 Anxiety disorder, unspecified: Secondary | ICD-10-CM

## 2020-02-27 DIAGNOSIS — E669 Obesity, unspecified: Secondary | ICD-10-CM | POA: Diagnosis not present

## 2020-02-27 MED ORDER — METAMUCIL SMOOTH TEXTURE 58.6 % PO POWD: 1.0000 | Freq: Every day | ORAL | 12 refills | Status: DC

## 2020-02-27 NOTE — Progress Notes (Signed)
   SUBJECTIVE:   CHIEF COMPLAINT / HPI: f/u medications, lymphedema, deperssion  Lymphadema- greatly improved with compression stockings. PT- going well. Getting a machine for lymphedema to use at home.  Counseling- feels that she is well overall but had an episode last week that was maybe a panic attack and felt like she was having difficulty breathing. It passed after a few minutes and had no specific trigger.    Obesity- uses prune juice to lose weight via frequent BMs. Also is not trying a diet consisting of coffee and lemon juice that she learned about from a tiktoc dr.  Loletta Specter:   BP 112/70   Pulse 74   Ht 5\' 8"  (1.727 m)   Wt 248 lb 12.8 oz (112.9 kg)   SpO2 100%   BMI 37.83 kg/m   General: NAD, pleasant, able to participate in exam Extremities: compression stockings in place. Greatly improved LE edema. No pitting or pain. WWP. Skin: warm and dry, no rashes noted Neuro: alert and oriented, no focal deficits Psych: Normal affect and mood  ASSESSMENT/PLAN:   Obesity (BMI 30-39.9) Congratulated patient on weight loss.  I would prefer she not do fad diets or diuretics for weight loss. Continue to address and encourage more physical activity  Anxiety and depression Meeting with Dr. Hartford Poli today PHQ-9 is stable at 12 today. Neg SI Describes a possible panic attack recently.  Atarax is not helping so we discontinued   Lymphedema Greatly improved. Continue compression stockings and lymph clinic treatments     Richarda Osmond, [place]

## 2020-02-27 NOTE — Patient Instructions (Signed)
It was a pleasure to see you today!  To summarize our discussion for this visit:  Please see Dr. Hartford Poli about your panic symptoms and we can discuss ways to manage this  i'm so glad that your lymphadema is doing well  Keep up the good work with weight loss!  Our time was cut short today due to late arrival so please make sure to come early next month!  Some additional health maintenance measures we should update are: Health Maintenance Due  Topic Date Due  . COVID-19 Vaccine (3 - Booster for Pfizer series) 12/25/2019  .    Call the clinic at [phone] if your symptoms worsen or you have any concerns.   Thank you for allowing me to take part in your care,  Dr. Doristine Mango

## 2020-02-27 NOTE — BH Specialist Note (Signed)
Integrated Behavioral Health Initial In-Person Visit  MRN: [id number] Name: [name]  Number of [place] Clinician visits:: 1/6 Session Start time: 7353  Session End time: 2992 Total time: 30 minutes  Types of Service: Individual psychotherapy    Warm Hand Off Completed.       Subjective: [name] is a 54 y.o. female  Patient was referred by Dr. Prince Rome for anxiety. Patient reports the following symptoms/concerns: Pt reported anxiety attacks especially when she experiences trapped airway due to medical condition. Pt shared she still struggles with loss of mom.  Talked through CBT coping strategies for anxiety attacks. Pt planning on joining [employer] to engage socially and exercise more.    Duration of problem: not known; Severity of problem: moderate  Objective: Mood: Anxious and Affect: Appropriate Risk of harm to self or others: No plan to harm self or others  Life Context: Life Changes: menopause  Patient and/or Family's Strengths/Protective Factors: Social and Emotional competence  Goals Addressed: Patient will: 1. Reduce symptoms of: anxiety 2. Increase knowledge and/or ability of: coping skills: CBT coping strategies: coming up with words that start with each letter of alphabet.  Progress towards Goals: Ongoing  Interventions: Interventions utilized: CBT Cognitive Behavioral Therapy  Standardized Assessments completed: Not Needed  Patient and/or Family Response: Pt engaged in wanting to find ways to cope with anxiety  Patient Centered Plan: Patient is on the following Treatment Plan(s):  Anxiety attacks   Assessment: Patient currently experiencing anxiety attacks related to medical condition   Patient may benefit from CBT coping strategies   Plan: 1. Follow up with behavioral health clinician on : 1 month 2. Behavioral recommendations: CBT coping strategies  3. Referral(s): [place] (In  Clinic)   Erlinda Hong, PhD., LMFT-A

## 2020-02-28 MED ORDER — OXYCODONE HCL 5 MG PO TABS: 5.0000 mg | ORAL_TABLET | Freq: Two times a day (BID) | ORAL | 0 refills | Status: DC | PRN

## 2020-02-28 NOTE — Assessment & Plan Note (Signed)
Congratulated patient on weight loss.  I would prefer she not do fad diets or diuretics for weight loss. Continue to address and encourage more physical activity

## 2020-02-28 NOTE — Assessment & Plan Note (Signed)
Greatly improved. Continue compression stockings and lymph clinic treatments

## 2020-02-28 NOTE — Assessment & Plan Note (Addendum)
Meeting with Dr. Hartford Poli today PHQ-9 is stable at 12 today. Neg SI Describes a possible panic attack recently.  Atarax is not helping so we discontinued

## 2020-03-03 DIAGNOSIS — Q820 Hereditary lymphedema: Secondary | ICD-10-CM | POA: Diagnosis not present

## 2020-03-04 DIAGNOSIS — F17200 Nicotine dependence, unspecified, uncomplicated: Secondary | ICD-10-CM | POA: Insufficient documentation

## 2020-03-04 DIAGNOSIS — H93293 Other abnormal auditory perceptions, bilateral: Secondary | ICD-10-CM | POA: Diagnosis not present

## 2020-03-04 DIAGNOSIS — Z87891 Personal history of nicotine dependence: Secondary | ICD-10-CM | POA: Insufficient documentation

## 2020-03-04 DIAGNOSIS — H9203 Otalgia, bilateral: Secondary | ICD-10-CM | POA: Diagnosis not present

## 2020-03-04 DIAGNOSIS — F17210 Nicotine dependence, cigarettes, uncomplicated: Secondary | ICD-10-CM | POA: Insufficient documentation

## 2020-03-04 DIAGNOSIS — K219 Gastro-esophageal reflux disease without esophagitis: Secondary | ICD-10-CM | POA: Insufficient documentation

## 2020-03-06 DIAGNOSIS — H5203 Hypermetropia, bilateral: Secondary | ICD-10-CM | POA: Diagnosis not present

## 2020-03-25 ENCOUNTER — Other Ambulatory Visit: Payer: Self-pay | Admitting: Student in an Organized Health Care Education/Training Program

## 2020-03-25 DIAGNOSIS — F419 Anxiety disorder, unspecified: Secondary | ICD-10-CM

## 2020-03-31 ENCOUNTER — Other Ambulatory Visit: Payer: Self-pay

## 2020-03-31 ENCOUNTER — Ambulatory Visit (INDEPENDENT_AMBULATORY_CARE_PROVIDER_SITE_OTHER): Payer: Medicaid Other | Admitting: Student in an Organized Health Care Education/Training Program

## 2020-03-31 ENCOUNTER — Encounter: Payer: Self-pay | Admitting: Student in an Organized Health Care Education/Training Program

## 2020-03-31 DIAGNOSIS — Z131 Encounter for screening for diabetes mellitus: Secondary | ICD-10-CM | POA: Diagnosis not present

## 2020-03-31 DIAGNOSIS — Z289 Immunization not carried out for unspecified reason: Secondary | ICD-10-CM | POA: Diagnosis not present

## 2020-03-31 DIAGNOSIS — F32A Depression, unspecified: Secondary | ICD-10-CM | POA: Diagnosis not present

## 2020-03-31 DIAGNOSIS — F419 Anxiety disorder, unspecified: Secondary | ICD-10-CM | POA: Diagnosis not present

## 2020-03-31 DIAGNOSIS — E669 Obesity, unspecified: Secondary | ICD-10-CM

## 2020-03-31 DIAGNOSIS — E890 Postprocedural hypothyroidism: Secondary | ICD-10-CM | POA: Diagnosis not present

## 2020-03-31 DIAGNOSIS — Z13220 Encounter for screening for lipoid disorders: Secondary | ICD-10-CM | POA: Diagnosis not present

## 2020-03-31 DIAGNOSIS — M545 Low back pain, unspecified: Secondary | ICD-10-CM | POA: Diagnosis not present

## 2020-03-31 DIAGNOSIS — M79644 Pain in right finger(s): Secondary | ICD-10-CM

## 2020-03-31 DIAGNOSIS — D508 Other iron deficiency anemias: Secondary | ICD-10-CM

## 2020-03-31 MED ORDER — OXYCODONE HCL 5 MG PO TABS: 5.0000 mg | ORAL_TABLET | Freq: Two times a day (BID) | ORAL | 0 refills | Status: DC | PRN

## 2020-03-31 NOTE — Patient Instructions (Signed)
It was a pleasure to see you today!  To summarize our discussion for this visit:  We are checking some labs today and I will call you with the results by the end of the week.  I'm glad to hear that you are liking counseling. I think this is such a great option for you.   Please try to stay active as much as you can because that can help prevent worsening pain from an injury such as your fall.   You can try Voltaren gel on your joints such as your finger, knees and elbow  Some additional health maintenance measures we should update are: Health Maintenance Due  Topic Date Due  . COVID-19 Vaccine (3 - Booster for Pfizer series) 12/25/2019  .    Please return to our clinic to see me in 2  months.  Call the clinic at [phone] if your symptoms worsen or you have any concerns.   Thank you for allowing me to take part in your care,  Dr. Doristine Mango

## 2020-03-31 NOTE — Progress Notes (Signed)
   SUBJECTIVE:   CHIEF COMPLAINT / HPI: medication refill, anxiety/depression f/u  covid booster due- has an appointment to get it February 9th.  Does not want to get it today since she has important plans coming up and doesn't want to feel sick for it.  Anxiety/depression- I asked her how counseling was going and she gave me a thumbs up. She really enjoyed talking to Dr. Hartford Poli but still is not ready to discuss her mother's passing. Using coping strategies learned in therapy that are helping. Got to 8 letters and got relief. She will continue to use these coping strategies and follow-up with more counseling with Dr. Hilbert Corrigan.  Obesity- getting medicare and then will get [employer].  Hypothyroidism-taking prescription as prescribed.  Vitamin D-calcium mildly low at her last check so will check today  Finger knot R-distal IP pain without redness or injury. Sensation, strength, ROM intact.  Present for about 2-3 days.  Fell on the ice 1 week ago. Has been taking double her oxy. (10 mg daily). Ran out of medication early but wants to be clear that she is not asking for any more medication.  OBJECTIVE:   BP 110/70   Pulse 61   Ht 5\' 8"  (1.727 m)   Wt 255 lb 8 oz (115.9 kg)   SpO2 99%   BMI 38.85 kg/m   General: NAD, pleasant, able to participate in exam Respiratory: CTAB, normal effort, No wheezes, rales or rhonchi Back: Approximately 3 x 3 cm ecchymosis on lumbar spine which is acutely tender to even brushing the skin. No midline tenderness. Extremities: Improved edema from baseline Neuro: alert and oriented, no focal deficits Psych: Normal affect and mood  ASSESSMENT/PLAN:   COVID-19 vaccine series not completed Patient scheduled for Covid booster and declines today although she is due  Anxiety and depression Making great strides with counseling. Continue therapy  Obesity (BMI 30-39.9) Encouraged patient to stay active Recommended she do at home exercises until she can  afford gym membership  Hypothyroidism TSH was low at last check and had medication adjustments. Recheck TSH today  Hypocalcemia Vitamin D level check today  Finger pain, right Acute finger pain without known injury. Does not appear infectious, unlikely gout Consider arthritis. Recommended topical Voltaren gel and reassess if does not improve  Back pain Acute from fall on ice.  Patient still able to ambulate.  Bruise present on lower back which is tender to palpation. Recommended heating pad and Voltaren gel as well as light activity. Return for imaging if not improving Patient ran out of her chronic Oxy IR early due to doubling the dose for her acute back pain. Refilling oxy today on schedule with increased to 75 tablets 1 time.     [place]

## 2020-04-01 DIAGNOSIS — Z28311 Partially vaccinated for covid-19: Secondary | ICD-10-CM | POA: Insufficient documentation

## 2020-04-01 DIAGNOSIS — M79644 Pain in right finger(s): Secondary | ICD-10-CM | POA: Insufficient documentation

## 2020-04-01 DIAGNOSIS — Z289 Immunization not carried out for unspecified reason: Secondary | ICD-10-CM | POA: Insufficient documentation

## 2020-04-01 NOTE — Assessment & Plan Note (Signed)
Vitamin D level check today 

## 2020-04-01 NOTE — Assessment & Plan Note (Signed)
Making great strides with counseling. Continue therapy

## 2020-04-01 NOTE — Assessment & Plan Note (Addendum)
Acute from fall on ice.  Patient still able to ambulate.  Bruise present on lower back which is tender to palpation. Recommended heating pad and Voltaren gel as well as light activity. Return for imaging if not improving Patient ran out of her chronic Oxy IR early due to doubling the dose for her acute back pain. Refilling oxy today on schedule with increased to 75 tablets 1 time.

## 2020-04-01 NOTE — Assessment & Plan Note (Signed)
>>  ASSESSMENT AND PLAN FOR LOW BACK PAIN WRITTEN ON 04/01/2020  3:30 PM BY ANDERSON, CHELSEY L, DO  Acute from fall on ice.  Patient still able to ambulate.  Bruise present on lower back which is tender to palpation. Recommended heating pad and Voltaren gel as well as light activity. Return for imaging if not improving Patient ran out of her chronic Oxy IR early due to doubling the dose for her acute back pain. Refilling oxy today on schedule with increased to 75 tablets 1 time.

## 2020-04-01 NOTE — Assessment & Plan Note (Signed)
TSH was low at last check and had medication adjustments. Recheck TSH today

## 2020-04-01 NOTE — Assessment & Plan Note (Signed)
Acute finger pain without known injury. Does not appear infectious, unlikely gout Consider arthritis. Recommended topical Voltaren gel and reassess if does not improve

## 2020-04-01 NOTE — Assessment & Plan Note (Signed)
Patient scheduled for Covid booster and declines today although she is due

## 2020-04-01 NOTE — Assessment & Plan Note (Signed)
Encouraged patient to stay active Recommended she do at home exercises until she can afford gym membership

## 2020-04-03 DIAGNOSIS — Q820 Hereditary lymphedema: Secondary | ICD-10-CM | POA: Diagnosis not present

## 2020-04-04 ENCOUNTER — Other Ambulatory Visit: Payer: Self-pay | Admitting: Student in an Organized Health Care Education/Training Program

## 2020-04-07 ENCOUNTER — Other Ambulatory Visit: Payer: Self-pay | Admitting: Student in an Organized Health Care Education/Training Program

## 2020-04-07 ENCOUNTER — Telehealth: Payer: Self-pay | Admitting: Student in an Organized Health Care Education/Training Program

## 2020-04-07 NOTE — Telephone Encounter (Signed)
Patient is calling letting doctor know she is out of Quetiapine 50mg . Patient states it is needing a PA. Patient also wants to state that she has been out. Also refill on Spiriva inhaler. Please advise. Thanks!

## 2020-04-09 ENCOUNTER — Telehealth: Payer: Self-pay

## 2020-04-09 NOTE — Telephone Encounter (Signed)
Pt called in has been without her meds for the last 4 or 5 days. Needing to have them called in as soon as possible

## 2020-04-10 NOTE — Telephone Encounter (Signed)
Patient calling back regarding her Seroquel. Previous message wasn't very detailed, so not sure if this is pertaining to the same medication. She says she's been without it for a week now. Please call patient when her Seroquel has been called in. Please advise.

## 2020-04-10 NOTE — Telephone Encounter (Signed)
Please ask patient to send all refill requests through her pharmacy. I have spoken to her about this several times.  Most of the time she has refills already waiting for her and doesn't check before calling the clinic.

## 2020-04-15 ENCOUNTER — Telehealth: Payer: Self-pay

## 2020-04-16 ENCOUNTER — Ambulatory Visit (INDEPENDENT_AMBULATORY_CARE_PROVIDER_SITE_OTHER): Payer: Medicaid Other

## 2020-04-16 ENCOUNTER — Other Ambulatory Visit: Payer: Self-pay

## 2020-04-16 DIAGNOSIS — Z23 Encounter for immunization: Secondary | ICD-10-CM

## 2020-04-16 NOTE — Progress Notes (Signed)
   Covid-19 Vaccination Clinic  Name:  [name]    MRN: [id number] DOB: 1965-08-20  04/16/2020   Patient presents to nurse clinic for booster COVID vaccine. Administered in LD, site unremarkable, tolerated injection well.   [name] was observed post Covid-19 immunization for 15 minutes without incident. She was provided with Vaccine Information Sheet and instruction to access the V-Safe system.   [name] was instructed to call 911 with any severe reactions post vaccine: Marland Kitchen Difficulty breathing  . Swelling of face and throat  . A fast heartbeat  . A bad rash all over body  . Dizziness and weakness  Provided patient with updated immunization record and card.   Talbot Grumbling, RN

## 2020-04-21 ENCOUNTER — Other Ambulatory Visit: Payer: Self-pay | Admitting: Student in an Organized Health Care Education/Training Program

## 2020-04-30 ENCOUNTER — Ambulatory Visit (INDEPENDENT_AMBULATORY_CARE_PROVIDER_SITE_OTHER): Payer: Medicaid Other | Admitting: Psychology

## 2020-04-30 ENCOUNTER — Other Ambulatory Visit: Payer: Self-pay

## 2020-04-30 ENCOUNTER — Ambulatory Visit: Payer: Self-pay

## 2020-04-30 ENCOUNTER — Ambulatory Visit (INDEPENDENT_AMBULATORY_CARE_PROVIDER_SITE_OTHER): Payer: Medicaid Other | Admitting: Student in an Organized Health Care Education/Training Program

## 2020-04-30 ENCOUNTER — Ambulatory Visit (INDEPENDENT_AMBULATORY_CARE_PROVIDER_SITE_OTHER): Payer: Medicaid Other | Admitting: Family Medicine

## 2020-04-30 VITALS — BP 112/70 | HR 72 | Ht 68.0 in | Wt 248.4 lb

## 2020-04-30 VITALS — BP 120/80 | Ht 68.0 in | Wt 248.0 lb

## 2020-04-30 DIAGNOSIS — M25521 Pain in right elbow: Secondary | ICD-10-CM

## 2020-04-30 DIAGNOSIS — Z5971 Insufficient health insurance coverage: Secondary | ICD-10-CM

## 2020-04-30 DIAGNOSIS — F419 Anxiety disorder, unspecified: Secondary | ICD-10-CM | POA: Diagnosis not present

## 2020-04-30 DIAGNOSIS — Z5989 Other problems related to housing and economic circumstances: Secondary | ICD-10-CM | POA: Diagnosis not present

## 2020-04-30 DIAGNOSIS — F32A Depression, unspecified: Secondary | ICD-10-CM | POA: Diagnosis not present

## 2020-04-30 DIAGNOSIS — M26623 Arthralgia of bilateral temporomandibular joint: Secondary | ICD-10-CM | POA: Diagnosis not present

## 2020-04-30 DIAGNOSIS — N951 Menopausal and female climacteric states: Secondary | ICD-10-CM

## 2020-04-30 MED ORDER — METHYLPREDNISOLONE ACETATE 40 MG/ML IJ SUSP
40.0000 mg | Freq: Once | INTRAMUSCULAR | Status: AC
  Administered 2020-04-30: 40 mg via INTRA_ARTICULAR

## 2020-04-30 MED ORDER — TRAZODONE HCL 50 MG PO TABS
50.0000 mg | ORAL_TABLET | Freq: Every day | ORAL | 0 refills | Status: DC
Start: 2020-04-30 — End: 2020-05-06

## 2020-04-30 MED ORDER — ONDANSETRON HCL 4 MG PO TABS: 4.0000 mg | ORAL_TABLET | Freq: Three times a day (TID) | ORAL | 0 refills | Status: DC | PRN

## 2020-04-30 MED ORDER — CYCLOBENZAPRINE HCL 5 MG PO TABS: 5.0000 mg | ORAL_TABLET | Freq: Every evening | ORAL | 1 refills | Status: DC | PRN

## 2020-04-30 NOTE — Assessment & Plan Note (Addendum)
No inciting event or injury Acutely tender to palpation of brachioradialis and with essentially any movement of the right elbow or resisted pronation of right hand.  Strength seems to be intact but limited effort with pain.  Patient has tried using elbow and wrist braces and topical Voltaren gel without any improvement. Referred to sports medicine for later today to discuss physical therapy exercises, possible ultrasound, as well as possible steroid injection

## 2020-04-30 NOTE — Progress Notes (Signed)
SUBJECTIVE:   CHIEF COMPLAINT / HPI: elbow pain, jaw pain, and vaginal dryness. f/u obesity, anxiety  Jaw pain-bilateral and chronic.  Seen by dentist recently and was told that she has evidence of teeth grinding.  She was referred to another dentist in [place] which was likely to fit for mouthguard.  She was also treated with amoxicillin and has 1 day left.   Vaginal dryness-has been ongoing issue for patient and has failed multiple treatments.  She is requesting topical estrogen treatment at this time.  Describes having pain with wiping on her "pee-pee" hole.  Denies blood in urine or dysuria at this time.  Recommended pelvic exam and urinalysis today but patient declined.  Depression/anxiety-has not been using Atarax but would like to keep trying it.  Enjoys following up with our counselor here and has brought a list of concerns that she would like to talk with her about today.  She has been taking Seroquel nightly and it helps her sleep.  She has not taken venlafaxine.  Insurance concerns- trying to get medicare. Would like to speak with CCM.   Elbow pain- uses brace, wants steroid. 1400. hudnal   Obesity- started using Prune juice again.  OBJECTIVE:   BP 112/70   Pulse 72   Ht 5\' 8"  (1.727 m)   Wt 248 lb 6.4 oz (112.7 kg)   SpO2 100%   BMI 37.77 kg/m   General: NAD, pleasant, able to participate in exam Extremities: no edema. WWP. Skin: warm and dry, no rashes noted Neuro: alert and oriented, no focal deficits Psych: Normal affect and mood  Right elbow: Observation: Mild swelling of right brachial radialis area and compared to left.  Negative for erythema Palpation: Acutely tender to palpation of proximal brachioradialis as well as both medial and lateral epicondyles ROM: Full range of motion of elbow with pain. Strength: Decreased strength due to pain at the elbow with resisted pronation of right hand.  Does not have pain with resisted supination of  hand. Sensation: Intact  ASSESSMENT/PLAN:   Elbow pain, right No inciting event or injury Acutely tender to palpation of brachioradialis and with essentially any movement of the right elbow or resisted pronation of right hand.  Strength seems to be intact but limited effort with pain.  Patient has tried using elbow and wrist braces and topical Voltaren gel without any improvement. Referred to sports medicine for later today to discuss physical therapy exercises, possible ultrasound, as well as possible steroid injection  Vaginal dryness, menopausal Recommended a pelvic exam and urinalysis for further differentiation of cause of symptoms and patient refused today. She stated that she would come back another day for these tests. Given lubricant samples today  Anxiety and depression Continues with counseling.Seeing Dr. Hartford Poli today and has brought a list of topics she would like to discuss.  Refilling atarax to try again although patient was not sure if she saw benefit last time. Discontinuing Seroquel Instead, will do trial of trazodone with additional benefit of treating insomnia.  Insurance coverage problems Patient has struggled with communicating with medicaid for medication coverage and has caused several disruptions in her treatments. She believes she is eligible for medicare and would like to initiate this insurance but needs help to initiate and requests assistance. She would like to talk with CCM.   TMJ arthralgia Follow up with dentist.  Can take tylenol/ibuprofen as needed. Prescribing flexeril for bed time to help prevent teeth grinding and worsening of pain F/u 1 month  [place]

## 2020-04-30 NOTE — Patient Instructions (Signed)
You have lateral epicondylitis Try to avoid painful activities as much as possible. Ice the area 3-4 times a day for 15 minutes at a time. Topical voltaren gel up to 4 times a day can help. You were given an injection today. Counterforce brace as directed can help unload area - wear this regularly if it provides you with relief. Wrist brace to help prevent motions that can exacerbate pain. Hammer rotation exercise, wrist extension exercise with 1 pound weight - 3 sets of 10 once a day but start these in 1 week. Stretching - hold for 20-30 seconds and repeat 3 times. Consider physical therapy, nitro patches if not improving. Follow up in 1 month or as needed if you're doing well.

## 2020-04-30 NOTE — BH Specialist Note (Signed)
Integrated Behavioral Health Follow Up In-Person Visit  MRN: [id number] Name: [name]  Number of [place] Clinician visits: 2/6 Session Start time: 8295 Session End time: 12 Total time: 30 minutes  Types of Service: Individual psychotherapy   Subjective: [name] is a 55 y.o. female  Patient was referred by Dr. Prince Rome for depression and anxiety. Patient reports the following symptoms/concerns: Pt reported she experiences anxiety with racing thoughts when she is home alone.    Pt reported she struggles being alone.   Treatment planned with pt on items she would like to work on in therapy including her anxiety, depression and mother's death.   Discussed getting involved in community and possibility of dog helping with anxiety. Pt reported that CBT strategy of using alphabet to calm herself down during anxiety attack has been helpful.    Duration of problem: not known; Severity of problem: moderate  Objective: Mood: Euthymic and Affect: Appropriate Risk of harm to self or others: No plan to harm self or others  Duration of problem: not known; Severity of problem: moderate  Objective: Mood: Anxious and Affect: Appropriate Risk of harm to self or others: No plan to harm self or others  Life Context: Life Changes: menopause  Patient and/or Family's Strengths/Protective Factors: Social and Emotional competence  Goals Addressed: Patient will: 1. Reduce symptoms of: anxiety: racing thoughts; getting overwhelmed 2. Increase knowledge and/or ability of: coping skills: CBT coping strategies: coming up with words that start with each letter of alphabet.  Progress towards Goals: Ongoing  Interventions: Interventions utilized: CBT Cognitive Behavioral Therapy  Standardized Assessments completed: Not Needed  Patient and/or Family Response: Pt engaged in wanting to find ways to cope with anxiety  Patient Centered Plan: Patient is on  the following Treatment Plan(s):  Anxiety treatment plan    Assessment: Patient currently experiencing anxiety attacks related to medical condition and life circumstances   Patient may benefit from CBT coping strategies   Plan: 1. Follow up with behavioral health clinician on : 1 month 2. Behavioral recommendations: CBT coping strategies  3. Referral(s): [place] (In Clinic)   Erlinda Hong, PhD., LMFT-A

## 2020-04-30 NOTE — Patient Instructions (Addendum)
It was a pleasure to see you today!  To summarize our discussion for this visit:  Jaw pain-for your teeth grinding at night, we can try a muscle relaxer to take at bedtime.  Please follow-up with your dentist.  For your vaginal dryness, I would really like to do a pelvic exam and urine test.  Understand that you are not prepared for this today so please let us do those tests at your next visit soon and we can decide on how to treat at that time.  I given you some samples of lubricants for now.  For insomnia, we are going to stop your Seroquel and try trazodone which has the added benefit of treating depression/anxiety.  You can also use Atarax as needed which I will refill today when you are feeling particularly anxious.  I will contact our social worker today to reach out to you to discuss your insurance concerns.  I have made you an appointment with sports medicine this afternoon to discuss your elbow pain and possibly get a steroid injection.  We have discussed that this could possibly cause skin discoloration but may benefit your pain. Please go to the clinic at 2:00 this afternoon.  Call the clinic at [phone] if your symptoms worsen or you have any concerns.   Thank you for allowing me to take part in your care,  Dr. Doristine Mango

## 2020-05-01 ENCOUNTER — Encounter: Payer: Self-pay | Admitting: Family Medicine

## 2020-05-01 DIAGNOSIS — M26629 Arthralgia of temporomandibular joint, unspecified side: Secondary | ICD-10-CM | POA: Insufficient documentation

## 2020-05-01 DIAGNOSIS — Z5989 Other problems related to housing and economic circumstances: Secondary | ICD-10-CM | POA: Insufficient documentation

## 2020-05-01 NOTE — Assessment & Plan Note (Signed)
Patient has struggled with communicating with medicaid for medication coverage and has caused several disruptions in her treatments. She believes she is eligible for medicare and would like to initiate this insurance but needs help to initiate and requests assistance. She would like to talk with CCM.

## 2020-05-01 NOTE — Assessment & Plan Note (Signed)
Continues with counseling.Seeing Dr. Hartford Poli today and has brought a list of topics she would like to discuss.  Refilling atarax to try again although patient was not sure if she saw benefit last time. Discontinuing Seroquel Instead, will do trial of trazodone with additional benefit of treating insomnia.

## 2020-05-01 NOTE — Progress Notes (Signed)
PCP: Richarda Osmond, DO  Subjective:   HPI: Patient is a 55 y.o. female here for right elbow pain.  Patient reports she's had about 1 month of lateral forearm and elbow pain. Worse with holding her phone, using right upper extremity. Has tried ibuprofen without much benefit. No numbness or tingling but pain has burning quality to it. Radiates up to her neck. Right handed.  Past Medical History:  Diagnosis Date  . Allergy   . Anxiety   . Arthritis    knees,hands, hip  pt. Denies  . Cellulitis   . Colon polyps 2013   SESSIL SERRATED ADENOMA (ALL FRAGMENTS)  . Complication of anesthesia    Lowered BP had to keep pt. overnight  . COPD (chronic obstructive pulmonary disease) ([place])   . Depression   . Fibroadenosis breast   . GERD (gastroesophageal reflux disease)    does not take med  . Heart murmur    as child  pt. denies  . Hyperlipidemia    no meds - diet controlled  . Hypothyroidism   . Lymph edema    both legs and feet  . Migraine   . Plantar fasciitis   . Syphilis Treated in early 2012 during hospitalization  . Uterine fibroid   . Varicose veins     Current Outpatient Medications on File Prior to Visit  Medication Sig Dispense Refill  . albuterol (VENTOLIN HFA) 108 (90 Base) MCG/ACT inhaler INHALE 2 PUFFS INTO THE LUNGS EVERY 4 HOURS AS NEEDED FOR SHORTNESS OF BREATH 8.5 g 1  . clobetasol cream (TEMOVATE) 0.05 % Apply topically 2 (two) times daily. 120 g 3  . cyclobenzaprine (FLEXERIL) 5 MG tablet Take 1 tablet (5 mg total) by mouth at bedtime as needed for muscle spasms. 30 tablet 1  . dexlansoprazole (DEXILANT) 60 MG capsule Take 1 capsule (60 mg total) by mouth daily. 30 capsule 5  . Elastic Bandages & Supports (MEDICAL COMPRESSION STOCKINGS) MISC 2 Units by Does not apply route daily. 2 each 0  . fluticasone (FLONASE) 50 MCG/ACT nasal spray Place 1 spray into both nostrils daily. 1 spray in each nostril every day 16 g 12  . furosemide (LASIX) 40 MG tablet  TAKE 1 TABLET(40 MG) BY MOUTH DAILY 90 tablet 3  . hydrOXYzine (ATARAX/VISTARIL) 10 MG tablet TAKE 1 TABLET(10 MG) BY MOUTH DAILY AS NEEDED 30 tablet 0  . levothyroxine (SYNTHROID) 150 MCG tablet TAKE 1 TABLET(150 MCG) BY MOUTH DAILY BEFORE AND BREAKFAST 90 tablet 0  . mirabegron ER (MYRBETRIQ) 25 MG TB24 tablet Take 1 tablet (25 mg total) by mouth daily. (Patient not taking: Reported on 04/30/2020) 60 tablet 1  . Misc. Devices (SCD SOFT SLEEVES/THIGH LENGTH) MISC 1 Device by Does not apply route at bedtime. (Patient not taking: Reported on 04/30/2020) 2 each 0  . ondansetron (ZOFRAN) 4 MG tablet Take 1 tablet (4 mg total) by mouth every 8 (eight) hours as needed. 15 tablet 0  . pantoprazole (PROTONIX) 40 MG tablet TAKE 1 TABLET(40 MG) BY MOUTH DAILY (Patient not taking: Reported on 04/30/2020) 90 tablet 0  . pravastatin (PRAVACHOL) 20 MG tablet Take 1 tablet (20 mg total) by mouth daily. 90 tablet 3  . pregabalin (LYRICA) 150 MG capsule TAKE 1 CAPSULE BY MOUTH TWICE DAILY. 180 capsule 0  . PREMARIN vaginal cream INSERT 1 APPLICATORFUL VAGINALLY DAILY FOR 21 DAYS 30 g 0  . psyllium (METAMUCIL SMOOTH TEXTURE) 58.6 % powder Take 1 packet by mouth daily. (Patient not taking:  Reported on 04/30/2020) 283 g 12  . SPIRIVA HANDIHALER 18 MCG inhalation capsule PLACE 1 CAPSULE INTO THE INHALER AND INHALE DAILY AS NEEDED 30 capsule 2  . traZODone (DESYREL) 50 MG tablet Take 1 tablet (50 mg total) by mouth at bedtime. 60 tablet 0   No current facility-administered medications on file prior to visit.    Past Surgical History:  Procedure Laterality Date  . COLONOSCOPY  2013  . cyst removed     behind left knee and left cheek  . ENDOMETRIAL ABLATION    . ESOPHAGOGASTRODUODENOSCOPY  2013   normal  . EYE SURGERY     bilarteral  . HYDRADENITIS EXCISION Right 09/13/2017   Procedure: EXCISION HIDRADENITIS GROIN;  Surgeon: Clovis Riley, MD;  Location: [facility];  Service: General;  Laterality: Right;  LMA  .  INCISION AND DRAINAGE ABSCESS / HEMATOMA OF BURSA / KNEE / THIGH     right upper thigh - 2008  . NORPLANT REMOVAL     right upper arm - 12/2001  . svd     x 2  . THYROID SURGERY  2004   removed  . TUBAL LIGATION     2003  . WISDOM TOOTH EXTRACTION      Allergies  Allergen Reactions  . Vicodin [Hydrocodone-Acetaminophen] Itching    Social History   Socioeconomic History  . Marital status: Divorced    Spouse name: Not on file  . Number of children: 2  . Years of education: Not on file  . Highest education level: Not on file  Occupational History  . Occupation: disabled  Tobacco Use  . Smoking status: Former Smoker    Packs/day: 0.50    Years: 25.00    Pack years: 12.50    Types: Cigarettes    Quit date: 06/30/2012    Years since quitting: 7.8  . Smokeless tobacco: Never Used  Vaping Use  . Vaping Use: Never used  Substance and Sexual Activity  . Alcohol use: Not Currently    Comment: rare  . Drug use: Not Currently    Types: Marijuana    Comment: past hx 89yrs ago  . Sexual activity: Yes    Birth control/protection: Surgical  Other Topics Concern  . Not on file  Social History Narrative   Lives with son in [place].    Social Determinants of Health   Financial Resource Strain: Not on file  Food Insecurity: Not on file  Transportation Needs: Not on file  Physical Activity: Not on file  Stress: Not on file  Social Connections: Not on file  Intimate Partner Violence: Not on file    Family History  Problem Relation Age of Onset  . Diabetes Father   . Heart disease Father   . Hyperlipidemia Father   . Hypertension Father   . Diabetes Mother   . Hypertension Mother   . Hyperlipidemia Mother   . Diabetes Paternal Grandmother   . Colon cancer Neg Hx   . Esophageal cancer Neg Hx   . Rectal cancer Neg Hx   . Stomach cancer Neg Hx     BP 120/80   Ht 5\' 8"  (1.727 m)   Wt 248 lb (112.5 kg)   BMI 37.71 kg/m   No flowsheet data found.  No  flowsheet data found.  Review of Systems: See HPI above.     Objective:  Physical Exam:  Gen: NAD, comfortable in exam room  Right elbow: No deformity, swelling, bruising. FROM with 5/5 strength  but pain with resisted 3rd digit extension, wrist extension. Tenderness to palpation lateral epicondyle and just distal to this. NVI distally. Collateral ligaments intact.  Neck: No gross deformity, swelling, bruising. TTP right trapezius.  No midline/bony TTP. FROM. BUE strength 5/5.   Sensation intact to light touch.   Negative spurlings. NV intact distal BUEs.  Limited MSK u/s right elbow:  Hypoechoic change without common extensor tendon.  No neovascularity.   Assessment & Plan:  1. Right elbow pain - 2/2 lateral epicondylitis.  Home exercises and stretches reviewed.  Topical voltaren gel, tylenol, icing.  Wrist brace, counterforce brace.  We discussed risks of steroid injection including skin color change, infection, bleeding, but also that studies show short term benefit but more likely to have pain in a year compared to no injection - given her level of pain she would like to proceed.  F/u in 1 month or prn.  After informed written consent timeout was performed.  Patient was lying supine on exam table.  Area overlying right lateral epicondyle prepped with alcohol swab then utilizing ultrasound guidance patient's right common extensor tendon was injected with 1:1 lidocaine:depomedrol.  Patient tolerated procedure well without immediate complications.

## 2020-05-01 NOTE — Assessment & Plan Note (Signed)
Recommended a pelvic exam and urinalysis for further differentiation of cause of symptoms and patient refused today. She stated that she would come back another day for these tests. Given lubricant samples today

## 2020-05-01 NOTE — Assessment & Plan Note (Signed)
Follow up with dentist.  Can take tylenol/ibuprofen as needed. Prescribing flexeril for bed time to help prevent teeth grinding and worsening of pain F/u 1 month

## 2020-05-02 ENCOUNTER — Telehealth: Payer: Self-pay | Admitting: *Deleted

## 2020-05-02 NOTE — Chronic Care Management (AMB) (Signed)
  Care Management   Note  05/02/2020 Name: [name] MRN: [id number] DOB: [birth year]  [name] is a 55 y.o. year old female who is a primary care patient of Richarda Osmond, DO. I reached out to [name] by phone today in response to a referral sent by [name]'s PCP,Anderson, Chelsey L, DO.   [name] was given information about care management services today including:  1. Care management services include personalized support from designated clinical staff supervised by her physician, including individualized plan of care and coordination with other care providers 2. 24/7 contact phone numbers for assistance for urgent and routine care needs. 3. The patient may stop care management services at any time by phone call to the office staff.  Patient agreed to services and verbal consent obtained.   Follow up plan: Telephone appointment with care management team member scheduled for:05/06/2020  [facility]

## 2020-05-04 DIAGNOSIS — Q820 Hereditary lymphedema: Secondary | ICD-10-CM | POA: Diagnosis not present

## 2020-05-05 ENCOUNTER — Telehealth: Payer: Self-pay

## 2020-05-05 NOTE — Telephone Encounter (Signed)
Patient calls nurse line requesting refill on Oxycodone 5 mg tablets. This medication is not on current med list.   Please advise.   Talbot Grumbling, RN

## 2020-05-06 ENCOUNTER — Other Ambulatory Visit: Payer: Self-pay | Admitting: Student in an Organized Health Care Education/Training Program

## 2020-05-06 ENCOUNTER — Telehealth: Payer: Self-pay | Admitting: Student in an Organized Health Care Education/Training Program

## 2020-05-06 ENCOUNTER — Telehealth: Payer: Self-pay | Admitting: Licensed Clinical Social Worker

## 2020-05-06 ENCOUNTER — Telehealth: Payer: Medicaid Other

## 2020-05-06 MED ORDER — OXYCODONE HCL 5 MG PO TABS: 5.0000 mg | ORAL_TABLET | Freq: Two times a day (BID) | ORAL | 0 refills | Status: DC | PRN

## 2020-05-06 NOTE — Chronic Care Management (AMB) (Signed)
    Clinical Social Work  Care Management  Unsuccessful Phone Outreach    05/06/2020 Name: REASE SWINSON MRN: [id number] DOB: Jun 24, 1965  EYVONNE BURCHFIELD is a 55 y.o. year old female who is a primary care patient of Richarda Osmond, DO .   LCSW reached out to patient today by phone to introduce self, assess needs and offer Care Management services and interventions.   Called twice, telephone outreach was unsuccessful, phone not taking calls, unable to leave a HIPPA compliant phone message due to voice mail not set up.  Plan:Will  forward to care guide to reschedule appointment.  Review of patient status, including review of consultants reports, relevant laboratory and other test results, and collaboration with appropriate care team members and the patient's provider was performed as part of comprehensive patient evaluation and provision of care management services.     Casimer Lanius, [place] / [place]   [phone] 9:07 AM

## 2020-05-06 NOTE — Telephone Encounter (Signed)
Please ask patient to send prescription refills through her pharmacy. I have talked to her several times about this.  Thank you.

## 2020-05-06 NOTE — Telephone Encounter (Signed)
Patient is calling and would like to have her oxycodone refilled.  

## 2020-05-06 NOTE — Telephone Encounter (Signed)
Per [facility] protocol pts don't request narcotics from the pharmacy, they request it from [place]. Please advise. Brandalyn Harting Kennon Holter, CMA

## 2020-05-07 NOTE — Telephone Encounter (Signed)
Patient is calling and states that she thought Neoma Laming was suppose to call her today 03/02 instead of 03/1. She would like to know if Neoma Laming could call her back to set up another appointment.

## 2020-05-08 ENCOUNTER — Ambulatory Visit: Payer: Self-pay | Admitting: Licensed Clinical Social Worker

## 2020-05-08 NOTE — Chronic Care Management (AMB) (Signed)
    Clinical Social Work  Care Management  Phone Outreach    05/08/2020 Name: [name] MRN: [id number] DOB: 1965-07-03  [name] is a 55 y.o. year old female who is a primary care patient of Richarda Osmond, DO .   LCSW returned call to patient today to introduce self, assess needs and offer Care Management services and interventions.  Patient would like to schedule appointment. Plan:Phone appointment with LCSW scheduled 05/09/20  Review of patient status, including review of consultants reports, relevant laboratory and other test results, and collaboration with appropriate care team members and the patient's provider was performed as part of comprehensive patient evaluation and provision of care management services.    Casimer Lanius, [place] / [place]   [phone] 11:49 AM

## 2020-05-09 ENCOUNTER — Ambulatory Visit: Payer: Medicaid Other | Admitting: Licensed Clinical Social Worker

## 2020-05-09 DIAGNOSIS — Z789 Other specified health status: Secondary | ICD-10-CM

## 2020-05-09 NOTE — Chronic Care Management (AMB) (Signed)
Care Management Clinical Social Work Note  05/09/2020 Name: GLEE LASHOMB MRN: [id number] DOB: June 22, 1965  JENAY MORICI is a 55 y.o. year old female who is a primary care patient of Richarda Osmond, DO.  The Care Management team was consulted for assistance with chronic disease management and coordination needs.  Engaged with patient by telephone for initial visit in response to provider referral for social work care coordination services.  Patient wanted information about applying for social security benefits and discuss other unmet social needs.  Consent to Services:  Ms. Hass was given information about Care Management services today including:  1. Care Management services includes personalized support from designated clinical staff supervised by her physician, including individualized plan of care and coordination with other care providers 2. 24/7 contact phone numbers for assistance for urgent and routine care needs. 3. The patient may stop case management services at any time by phone call to the office staff.  Patient agreed to services and consent obtained.   Assessment: Patient is currently experiencing difficulty with anxiety, grief and stress. Anxiety and stree seems to be exacerbated by finincial restraints of being on a fixed income. She need extensive dental work, furniture for her home and would like to apply for Medicare. See Care Plan below for interventions and patient self-care actives. Recommendation: Patient may benefit from, and is in agreement to continue with Dr. Hartford Poli to assist with managing her symptoms.   Follow up Plan: LCSW will explore options to assist with patient's needs.   Patient would like continued follow-up.  CCM LCSW will f/u with patient in 5 to 7 days.  . Patient will call office if needed prior to next encounter.     Review of patient past medical history, allergies, medications, and health status, including review of relevant consultants reports  was performed today as part of a comprehensive evaluation and provision of chronic care management and care coordination services.  SDOH (Social Determinants of Health) assessments and interventions performed:  SDOH Interventions   Flowsheet Row Most Recent Value  SDOH Interventions   Stress Interventions Provide Counseling       Advanced Directives Status: Not addressed in this encounter.  Care Plan  Allergies  Allergen Reactions  . Vicodin [Hydrocodone-Acetaminophen] Itching    Outpatient Encounter Medications as of 05/09/2020  Medication Sig Note  . albuterol (VENTOLIN HFA) 108 (90 Base) MCG/ACT inhaler INHALE 2 PUFFS INTO THE LUNGS EVERY 4 HOURS AS NEEDED FOR SHORTNESS OF BREATH   . clobetasol cream (TEMOVATE) 0.05 % Apply topically 2 (two) times daily.   . cyclobenzaprine (FLEXERIL) 5 MG tablet Take 1 tablet (5 mg total) by mouth at bedtime as needed for muscle spasms.   Marland Kitchen dexlansoprazole (DEXILANT) 60 MG capsule Take 1 capsule (60 mg total) by mouth daily.   Regino Schultze Bandages & Supports (MEDICAL COMPRESSION STOCKINGS) MISC 2 Units by Does not apply route daily.   . fluticasone (FLONASE) 50 MCG/ACT nasal spray Place 1 spray into both nostrils daily. 1 spray in each nostril every day   . furosemide (LASIX) 40 MG tablet TAKE 1 TABLET(40 MG) BY MOUTH DAILY 04/30/2020: As needed  . hydrOXYzine (ATARAX/VISTARIL) 10 MG tablet TAKE 1 TABLET(10 MG) BY MOUTH DAILY AS NEEDED   . levothyroxine (SYNTHROID) 150 MCG tablet TAKE 1 TABLET(150 MCG) BY MOUTH DAILY BEFORE AND BREAKFAST   . ondansetron (ZOFRAN) 4 MG tablet Take 1 tablet (4 mg total) by mouth every 8 (eight) hours as needed.   Marland Kitchen  oxyCODONE (OXY IR/ROXICODONE) 5 MG immediate release tablet Take 1 tablet (5 mg total) by mouth every 12 (twelve) hours as needed for severe pain.   . pravastatin (PRAVACHOL) 20 MG tablet Take 1 tablet (20 mg total) by mouth daily.   . pregabalin (LYRICA) 150 MG capsule TAKE 1 CAPSULE BY MOUTH TWICE DAILY.    Marland Kitchen PREMARIN vaginal cream INSERT 1 APPLICATORFUL VAGINALLY DAILY FOR 21 DAYS 04/30/2020: Needs refills  . SPIRIVA HANDIHALER 18 MCG inhalation capsule PLACE 1 CAPSULE INTO THE INHALER AND INHALE DAILY AS NEEDED   . [START ON 05/21/2020] traZODone (DESYREL) 50 MG tablet TAKE 1 TABLET(50 MG) BY MOUTH AT BEDTIME    No facility-administered encounter medications on file as of 05/09/2020.    Patient Active Problem List   Diagnosis Date Noted  . Insurance coverage problems 05/01/2020  . 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 Elbow pain, right 04/30/2020  . COVID-19 vaccine series not completed 04/01/2020  . Ear pain 01/29/2020  . Overactive bladder 01/01/2020  . At risk for polypharmacy 12/05/2019  . Vagina bleeding 11/02/2019  . Chronic pain 07/25/2019  . Menopause 03/21/2019  . Chronic diarrhea 01/08/2019  . Vaginal dryness, menopausal 10/29/2016  . Restless legs 02/23/2016  . Back pain 07/02/2015  . 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 GERD (gastroesophageal reflux disease) 06/24/2011  . Obesity (BMI 30-39.9) 01/22/2011  . Anxiety and depression 10/24/2009  . Hyperlipidemia 01/05/2008  . Lymphedema 01/04/2007  . HIDRADENITIS SUPPURATIVA 08/17/2006  . Hypothyroidism 05/05/2006  . History of tobacco use 05/05/2006  . Episodic mood disorder ([place]) 05/05/2006    Conditions to be addressed/monitored: Financial constraints related to fixed income and Lacks knowledge of community resource: to meet unmet needs  Care Plan : Clinical Social Work  Updates made by Maurine Cane, LCSW since 05/09/2020 12:00 AM  Problem: need community support   Goal: Connec patient with resources to meet dental and finantial need   Start Date: 05/09/2020  Expected End Date: 09/04/2020  This Visit's Progress: On track  Priority: High  Current barriers:   . Patient in need of assistance with connecting to community resources for applying for Social Security & Medicare . Also  need assistance with furniture and dental ( mouth guard and missing teeth) . $545 for dental guard and $1,000 for missing teeth that are impacting her eating) insurance will cover most cost but she is unable to pay her portion. Marland Kitchen Acknowledges deficits with meeting these unmet need . Patient is unable to independently navigate community resource options without care coordination support Clinical Goals: patient will work with SW to address concerns related to unmet needs Clinical Interventions:  . Collaboration with Richarda Osmond, DO regarding development and update of comprehensive plan of care as evidenced by provider attestation and co-signature . Inter-disciplinary care team collaboration (see longitudinal plan of care) . Assessment of needs, barriers , agencies contacted, as well as how impacting  . Review various resources, discussed options and provided patient information Call Social Security Administration  . Collaborated with appropriate clinical care team members regarding patient needs ( exploring options to help with dental needs)  . Care Guide referral for assistance with furniture  placed . [employer] information provided; Physiological scientist . Resource information provided; Support and encouragement provided Patient Goals/Self-Care Activities: Over the next 30 days . Call [place] [phone] to schedule a phone appointment . I have placed a referral to see if there are any options to assist  with furniture     Casimer Lanius, [address] / [place]   [phone] 11:43 AM

## 2020-05-09 NOTE — Patient Instructions (Signed)
  Ms. Didio  it was nice speaking with you. Please call me directly [phone] if you have questions about the goals we discussed. Goals Addressed            This Visit's Progress   . Find Help in My Community       Timeframe:  Long-Range Goal Priority:  High Start Date:     05/09/20                        Expected End Date: 09/04/20                       Patient Goals/Self-Care Activities: Over the next 30 days . Call [name] [phone] to schedule a phone appointment . I have placed a referral to see if there are any options to assist with furniture  Why is this important?    Knowing how and where to find help for yourself or family in your neighborhood and community is an important skill.       Ms. Digilio received Care Coordination services today:  1. Care Coordination services include personalized support from designated clinical staff supervised by her physician, including individualized plan of care and coordination with other care providers 2. 24/7 contact [phone] for assistance for urgent and routine care needs. 3. Care Coordination are voluntary services and be declined at any time by calling the office.  Patient verbalizes understanding of instructions provided today.    Follow up plan: will schedule f/u in 5 to 7 days after review of community support options   Maurine Cane, LCSW

## 2020-05-12 ENCOUNTER — Telehealth: Payer: Self-pay

## 2020-05-12 NOTE — Telephone Encounter (Signed)
   Telephone encounter was:  Successful.  05/12/2020 Name: PERSEPHONIE HEGWOOD MRN: [id number] DOB: 10-Aug-1965  MASSIAH LONGANECKER is a 55 y.o. year old female who is a primary care patient of Richarda Osmond, DO . The community resource team was consulted for assistance with furniture  Care guide performed the following interventions: Patient provided with information about care guide support team and interviewed to confirm resource needs patient did not want a referral to [facility] Emailed information for [employer] and [employer] for [employer]..  Follow Up Plan:  Care guide will follow up with patient by phone over the next 7 days  Symphani Eckstrom, AAS Paralegal, [place] . Embedded Care Coordination [facility]  Care Management  [address],  [address] ??[email or web address]  ?? [phone]   [email or web address]

## 2020-05-13 ENCOUNTER — Ambulatory Visit: Payer: Medicaid Other | Admitting: Licensed Clinical Social Worker

## 2020-05-13 DIAGNOSIS — Z789 Other specified health status: Secondary | ICD-10-CM

## 2020-05-13 DIAGNOSIS — Z7189 Other specified counseling: Secondary | ICD-10-CM

## 2020-05-13 NOTE — Chronic Care Management (AMB) (Signed)
Care Management Clinical Social Work Note  05/13/2020 Name: [name] MRN: [id number] DOB: 1965-12-15  [name] is a 55 y.o. year old female who is a primary care patient of Richarda Osmond, DO.  The Care Management team was consulted for assistance with chronic disease management and coordination needs.  Engaged with patient by telephone for follow up visit in response to provider referral for social work chronic care management and care coordination services  Consent to Services:  Patient agreed to services and consent obtained.   Assessment: Patient continues to experience difficulty with meeting her dental needs.. See Care Plan below for interventions and patient self-care actives. Recommendation: Patient may benefit from, and is in agreement to contact her dentist to obtain needed information for patient assistance application .  Follow up Plan: LCSW will wait for patient to provide needed information before completing application for patient assistance. Patient would like continued follow-up.  CCM LCSW will f/u with pateint 05/20/20. Patient will call office if needed prior to next encounter    Review of patient past medical history, allergies, medications, and health status, including review of relevant consultants reports was performed today as part of a comprehensive evaluation and provision of chronic care management and care coordination services.  SDOH (Social Determinants of Health) assessments and interventions performed:    Advanced Directives Status: Not addressed in this encounter.  Care Plan  Allergies  Allergen Reactions  . Vicodin [Hydrocodone-Acetaminophen] Itching    Outpatient Encounter Medications as of 05/13/2020  Medication Sig Note  . albuterol (VENTOLIN HFA) 108 (90 Base) MCG/ACT inhaler INHALE 2 PUFFS INTO THE LUNGS EVERY 4 HOURS AS NEEDED FOR SHORTNESS OF BREATH   . clobetasol cream (TEMOVATE) 0.05 % Apply topically 2 (two) times daily.   .  cyclobenzaprine (FLEXERIL) 5 MG tablet Take 1 tablet (5 mg total) by mouth at bedtime as needed for muscle spasms.   Marland Kitchen dexlansoprazole (DEXILANT) 60 MG capsule Take 1 capsule (60 mg total) by mouth daily.   Regino Schultze Bandages & Supports (MEDICAL COMPRESSION STOCKINGS) MISC 2 Units by Does not apply route daily.   . fluticasone (FLONASE) 50 MCG/ACT nasal spray Place 1 spray into both nostrils daily. 1 spray in each nostril every day   . furosemide (LASIX) 40 MG tablet TAKE 1 TABLET(40 MG) BY MOUTH DAILY 04/30/2020: As needed  . hydrOXYzine (ATARAX/VISTARIL) 10 MG tablet TAKE 1 TABLET(10 MG) BY MOUTH DAILY AS NEEDED   . levothyroxine (SYNTHROID) 150 MCG tablet TAKE 1 TABLET(150 MCG) BY MOUTH DAILY BEFORE AND BREAKFAST   . ondansetron (ZOFRAN) 4 MG tablet Take 1 tablet (4 mg total) by mouth every 8 (eight) hours as needed.   Marland Kitchen oxyCODONE (OXY IR/ROXICODONE) 5 MG immediate release tablet Take 1 tablet (5 mg total) by mouth every 12 (twelve) hours as needed for severe pain.   . pravastatin (PRAVACHOL) 20 MG tablet Take 1 tablet (20 mg total) by mouth daily.   . pregabalin (LYRICA) 150 MG capsule TAKE 1 CAPSULE BY MOUTH TWICE DAILY.   Marland Kitchen PREMARIN vaginal cream INSERT 1 APPLICATORFUL VAGINALLY DAILY FOR 21 DAYS 04/30/2020: Needs refills  . SPIRIVA HANDIHALER 18 MCG inhalation capsule PLACE 1 CAPSULE INTO THE INHALER AND INHALE DAILY AS NEEDED   . [START ON 05/21/2020] traZODone (DESYREL) 50 MG tablet TAKE 1 TABLET(50 MG) BY MOUTH AT BEDTIME    No facility-administered encounter medications on file as of 05/13/2020.    Patient Active Problem List   Diagnosis Date Noted  .  Insurance coverage problems 05/01/2020  . 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 Elbow pain, right 04/30/2020  . COVID-19 vaccine series not completed 04/01/2020  . Ear pain 01/29/2020  . Overactive bladder 01/01/2020  . At risk for polypharmacy 12/05/2019  . Vagina bleeding 11/02/2019  . Chronic pain 07/25/2019  . Menopause 03/21/2019  .  Chronic diarrhea 01/08/2019  . Vaginal dryness, menopausal 10/29/2016  . Restless legs 02/23/2016  . Back pain 07/02/2015  . 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 GERD (gastroesophageal reflux disease) 06/24/2011  . Obesity (BMI 30-39.9) 01/22/2011  . Anxiety and depression 10/24/2009  . Hyperlipidemia 01/05/2008  . Lymphedema 01/04/2007  . HIDRADENITIS SUPPURATIVA 08/17/2006  . Hypothyroidism 05/05/2006  . History of tobacco use 05/05/2006  . Episodic mood disorder ([place]) 05/05/2006    Conditions to be addressed/monitored: Financial constraints related to being on a fixed income and Lacks knowledge of community resource: for assitance with dental needs  Care Plan : Clinical Social Work  Updates made by Maurine Cane, LCSW since 05/13/2020 12:00 AM  Problem: need community support     Goal: Connec patient with resources to meet dental and finantial need   Start Date: 05/09/2020  Expected End Date: 09/04/2020  Recent Progress: On track  Priority: High  Current barriers:   . Patient needs to call her dentist to provide permission for them to share needed information with me  . Patient in need of assistance with connecting to community resources for applying for Social Security & Medicare . Also need assistance with furniture and dental ( mouth guard and missing teeth) . $545 for dental guard and $1,000 for missing teeth that are impacting her eating) insurance will cover most cost but she is unable to pay her portion. Marland Kitchen Acknowledges deficits with meeting these unmet need . Patient is unable to independently navigate community resource options without care coordination support Clinical Goals: patient will work with SW to address concerns related to unmet needs Clinical Interventions:  . Collaboration with Richarda Osmond, DO regarding development and update of comprehensive plan of care as evidenced by provider attestation and  co-signature . Inter-disciplinary care team collaboration (see longitudinal plan of care) . Assessment of needs, barriers , agencies contacted, as well as how impacting ( patient confirmed the cost to get her teeth is 7,000.  She would like to focus on getting the mouth guard. Marland Kitchen LCSW will contact dentist office to obtain needed information once consent provided by patient [phone]; will submit application for [facility] patient assistance program once information is received.  . Review various resources, discussed options and provided patient information Call Social Security Administration  . Collaborated with appropriate clinical care team members regarding the Giving committee to help with dental needs; patient does not qualify)  . Care Guide referral for assistance with furniture  placed  (care guide contacted patient and provided information and resources) .  Problem-solving facilitated; Resource information provided; Support and encouragement provided Patient Goals/Self-Care Activities: Over the next 30 days . Call [place] [phone] to schedule a phone appointment . Call your dentist to provide consent for them to share the information needed for the patient assistance program     Casimer Lanius, [place] / [place]   [phone] 11:06 AM

## 2020-05-13 NOTE — Patient Instructions (Addendum)
  [name]  it was nice speaking with you. Please call me directly [phone] if you have questions about the goals we discussed. Goals Addressed            This Visit's Progress   . Find Help in My Community   On track    Timeframe:  Long-Range Goal Priority:  High Start Date:     05/09/20                        Expected End Date: 09/04/20                       Patient Goals/Self-Care Activities: Over the next 30 days . Call [place] [phone] to schedule a phone appointment . Call your dentist to provide consent for them to share the information needed for the patient assistance program  Why is this important?    Knowing how and where to find help for yourself or family in your neighborhood and community is an important skill.       [name] received Care Coordination services today:  1. Care Coordination services include personalized support from designated clinical staff supervised by her physician, including individualized plan of care and coordination with other care providers 2. 24/7 contact [phone] for assistance for urgent and routine care needs. 3. Care Coordination are voluntary services and be declined at any time by calling the office.  Patient verbalizes understanding of instructions provided today.    Follow up plan: Appointment scheduled for SW follow up with client by phone on: 05/20/20  Maurine Cane, LCSW

## 2020-05-16 ENCOUNTER — Telehealth: Payer: Self-pay

## 2020-05-16 NOTE — Telephone Encounter (Signed)
   Telephone encounter was:  Unsuccessful.  05/16/2020 Name: [name] MRN: [id number] DOB: September 12, 1965  Unsuccessful outbound call made today to assist with:  Furniture. Left message on voicemail to ensure that patient has received emailed resources for furniture. Will call patient again next week.  Outreach Attempt:  2nd Attempt  A HIPAA compliant voice message was left requesting a return call.  Instructed patient to call back at [phone].  Rosalea Withrow, AAS Paralegal, [place] . Embedded Care Coordination [facility] Health  Care Management  [address],  [address] ??[email or web address]  ?? [phone]   [email or web address]

## 2020-05-19 ENCOUNTER — Other Ambulatory Visit: Payer: Self-pay | Admitting: Student in an Organized Health Care Education/Training Program

## 2020-05-19 ENCOUNTER — Telehealth: Payer: Self-pay

## 2020-05-19 MED ORDER — PREMARIN 0.625 MG/GM VA CREA: TOPICAL_CREAM | VAGINAL | 0 refills | Status: DC

## 2020-05-19 NOTE — Telephone Encounter (Signed)
   Telephone encounter was:  Unsuccessful.  05/19/2020 Name: SHELA ESSES MRN: [id number] DOB: 1965-12-02  Unsuccessful outbound call made today to assist with:  Furniture. Received email from Vinnie Langton of [place] that she has contacted the patient and is working on finding her a queen bed frame, sheets and curtains.  Outreach Attempt:  2nd Attempt  A HIPAA compliant voice message was left requesting a return call.  Instructed patient to call back at [phone]  Shaquasha Gerstel, AAS Paralegal, [place] . Embedded Care Coordination [facility] Health  Care Management  [address], [place] [address] ??[email or web address]  ?? [phone]   [email or web address]

## 2020-05-20 ENCOUNTER — Telehealth: Payer: Self-pay

## 2020-05-20 ENCOUNTER — Ambulatory Visit: Payer: Medicaid Other | Admitting: Licensed Clinical Social Worker

## 2020-05-20 DIAGNOSIS — Z789 Other specified health status: Secondary | ICD-10-CM

## 2020-05-20 NOTE — Chronic Care Management (AMB) (Signed)
Care Management Clinical Social Work Note  05/20/2020 Name: [name] MRN: [id number] DOB: [birth year]  [name] is a 55 y.o. year old female who is a primary care patient of Richarda Osmond, DO.  The Care Management team was consulted for assistance with chronic disease management and coordination needs.  Engaged with patient by telephone for initial visit in response to provider referral for social work chronic care management and care coordination services  Consent to Services: Patient agreed to services and consent obtained.   Assessment: Patient still needs assistance with dental needs. .. See Care Plan below for interventions and patient self-care actives. Follow up Plan:  As of 4:00 requested information has not been received.  LCSW will wait to receive information from dentist office. Will f/u with patient if no information is received in the next 5 to 7 days,  Review of patient past medical history, allergies, medications, and health status, including review of relevant consultants reports was performed today as part of a comprehensive evaluation and provision of chronic care management and care coordination services.  SDOH (Social Determinants of Health) assessments and interventions performed:    Advanced Directives Status: Not addressed in this encounter.  Care Plan  Allergies  Allergen Reactions  . Vicodin [Hydrocodone-Acetaminophen] Itching    Outpatient Encounter Medications as of 05/20/2020  Medication Sig Note  . albuterol (VENTOLIN HFA) 108 (90 Base) MCG/ACT inhaler INHALE 2 PUFFS INTO THE LUNGS EVERY 4 HOURS AS NEEDED FOR SHORTNESS OF BREATH   . clobetasol cream (TEMOVATE) 0.05 % Apply topically 2 (two) times daily.   Marland Kitchen conjugated estrogens (PREMARIN) vaginal cream INSERT 1 APPLICATORFUL VAGINALLY DAILY FOR 21 DAYS   . cyclobenzaprine (FLEXERIL) 5 MG tablet Take 1 tablet (5 mg total) by mouth at bedtime as needed for muscle spasms.   Marland Kitchen dexlansoprazole  (DEXILANT) 60 MG capsule Take 1 capsule (60 mg total) by mouth daily.   Regino Schultze Bandages & Supports (MEDICAL COMPRESSION STOCKINGS) MISC 2 Units by Does not apply route daily.   . fluticasone (FLONASE) 50 MCG/ACT nasal spray Place 1 spray into both nostrils daily. 1 spray in each nostril every day   . furosemide (LASIX) 40 MG tablet TAKE 1 TABLET(40 MG) BY MOUTH DAILY 04/30/2020: As needed  . hydrOXYzine (ATARAX/VISTARIL) 10 MG tablet TAKE 1 TABLET(10 MG) BY MOUTH DAILY AS NEEDED   . levothyroxine (SYNTHROID) 150 MCG tablet TAKE 1 TABLET(150 MCG) BY MOUTH DAILY BEFORE AND BREAKFAST   . ondansetron (ZOFRAN) 4 MG tablet Take 1 tablet (4 mg total) by mouth every 8 (eight) hours as needed.   Marland Kitchen oxyCODONE (OXY IR/ROXICODONE) 5 MG immediate release tablet Take 1 tablet (5 mg total) by mouth every 12 (twelve) hours as needed for severe pain.   . pravastatin (PRAVACHOL) 20 MG tablet Take 1 tablet (20 mg total) by mouth daily.   . pregabalin (LYRICA) 150 MG capsule TAKE 1 CAPSULE BY MOUTH TWICE DAILY.   Marland Kitchen SPIRIVA HANDIHALER 18 MCG inhalation capsule PLACE 1 CAPSULE INTO THE INHALER AND INHALE DAILY AS NEEDED   . [START ON 05/21/2020] traZODone (DESYREL) 50 MG tablet TAKE 1 TABLET(50 MG) BY MOUTH AT BEDTIME    No facility-administered encounter medications on file as of 05/20/2020.    Patient Active Problem List   Diagnosis Date Noted  . Insurance coverage problems 05/01/2020  . 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 Elbow pain, right 04/30/2020  . COVID-19 vaccine series not completed 04/01/2020  . Ear pain 01/29/2020  .  Overactive bladder 01/01/2020  . At risk for polypharmacy 12/05/2019  . Vagina bleeding 11/02/2019  . Chronic pain 07/25/2019  . Menopause 03/21/2019  . Chronic diarrhea 01/08/2019  . Vaginal dryness, menopausal 10/29/2016  . Restless legs 02/23/2016  . Back pain 07/02/2015  . 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 GERD (gastroesophageal reflux  disease) 06/24/2011  . Obesity (BMI 30-39.9) 01/22/2011  . Anxiety and depression 10/24/2009  . Hyperlipidemia 01/05/2008  . Lymphedema 01/04/2007  . HIDRADENITIS SUPPURATIVA 08/17/2006  . Hypothyroidism 05/05/2006  . History of tobacco use 05/05/2006  . Episodic mood disorder ([place]) 05/05/2006    Conditions to be addressed/monitored:  Lacks knowledge of community resource:  Care Plan : Clinical Social Work  Updates made by Maurine Cane, LCSW since 05/20/2020 12:00 AM  Problem: need community support   Goal: Connec patient with resources to meet dental and finantial need   Start Date: 05/09/2020  Expected End Date: 09/04/2020  Recent Progress: On track  Priority: High  Current barriers:   . Patient needs to call her dentist to provide permission for them to share needed information with me  . Patient in need of assistance with connecting to community resources for applying for Social Security & Medicare . Also need assistance with furniture and dental ( mouth guard and missing teeth) . $545 for dental guard and $1,000 for missing teeth that are impacting her eating) insurance will cover most cost but she is unable to pay her portion. Marland Kitchen Acknowledges deficits with meeting these unmet need . Patient is unable to independently navigate community resource options without care coordination support Clinical Goals: patient will work with SW to address concerns related to unmet needs Clinical Interventions:  . Collaboration with Richarda Osmond, DO regarding development and update of comprehensive plan of care as evidenced by provider attestation and co-signature . Inter-disciplinary care team collaboration (see longitudinal plan of care) . Assessment of needs and barriers  as well as how impacting with getting the mouth guard. (patient has no provided needed information for LCSW to submit for patient assistance. (patient called dental office with LCSW on the line, explained what is needed,  office will submit release of information form to patient.   Dental office will send needed once they have release form. Marland Kitchen LCSW will submit application for [facility] patient assistance program once information is received.  . Review various resources, discussed options and provided patient information Call [place]( patient has not made progress with calling about Medicare) . Collaborated with appropriate clinical care team members regarding the Giving committee to help with dental needs; patient does not qualify)  . Care Guide referral for assistance with furniture  placed  (care guide contacted patient and provided information and resources) .  Problem-solving facilitated; Resource information provided; Support and encouragement provided Patient Goals/Self-Care Activities: Over the next 30 days . Call [place] [phone] to schedule a phone appointment . Complete release of information from your dentist to share the information needed for the patient assistance program . I will submit once I have all information      Casimer Lanius, [place] / [place]   [phone] 4:01 PM

## 2020-05-20 NOTE — Patient Instructions (Signed)
  [name]  it was nice speaking with you. Please call me directly [phone] if you have questions about the goals we discussed. Goals Addressed            This Visit's Progress   . Find Help in My Community       Timeframe:  Long-Range Goal Priority:  High Start Date:     05/09/20                        Expected End Date: 09/04/20                       Patient Goals/Self-Care Activities:  . Call [facility] [phone] to schedule a phone appointment . Complete release of information from your dentist to share the information needed for the patient assistance program . I will submit once I have all information   Why is this important?    Knowing how and where to find help for yourself or family in your neighborhood and community is an important skill.       [name] received Care Coordination services today:  1. Care Coordination services include personalized support from designated clinical staff supervised by her physician, including individualized plan of care and coordination with other care providers 2. 24/7 contact [phone] for assistance for urgent and routine care needs. 3. Care Coordination are voluntary services and be declined at any time by calling the office.  Patient verbalizes understanding of instructions provided today.    Follow up plan: will reach out to patient in 5 to 7 days  Maurine Cane, LCSW

## 2020-05-20 NOTE — Telephone Encounter (Signed)
   Telephone encounter was:  Successful.  05/20/2020 Name: SALVADOR BIGBEE MRN: [id number] DOB: 12/19/1965  ELLSIE VIOLETTE is a 55 y.o. year old female who is a primary care patient of Richarda Osmond, DO . The community resource team was consulted for assistance with furniture  Care guide performed the following interventions: Follow up call placed to the patient to discuss status of referral  Patient confirmed that she is being assisted with furniture by Vinnie Langton of [place] our contact with [facility]..  Follow Up Plan:  No further follow up planned at this time. The patient has been provided with needed resources.  Carolyn Maniscalco, AAS Paralegal, [place] . Embedded Care Coordination [facility]  Care Management  [address], [place] [address] ??[email or web address]  ?? [phone]   [email or web address]

## 2020-05-27 ENCOUNTER — Ambulatory Visit: Payer: Medicaid Other | Admitting: Licensed Clinical Social Worker

## 2020-05-27 ENCOUNTER — Ambulatory Visit: Payer: Medicaid Other | Admitting: Student in an Organized Health Care Education/Training Program

## 2020-05-27 DIAGNOSIS — Z789 Other specified health status: Secondary | ICD-10-CM

## 2020-05-27 NOTE — Chronic Care Management (AMB) (Signed)
Care Management Clinical Social Work Note  05/27/2020 Name: MADALYNNE GUTMANN MRN: [id number] DOB: [birth year]  ALISAH GRANDBERRY is a 55 y.o. year old female who is a primary care patient of Richarda Osmond, DO.  The Care Management team was consulted for assistance with chronic disease management and coordination needs.  Engaged with patient by telephone for follow up visit in response to provider referral for social work chronic care management and care coordination services  Consent to Services: Patient agreed to services and consent obtained.   Assessment: Patient continues to experience difficulty with getting release of information form from dentist to release information to LCSW.Marland KitchenDentist office will mail the form. Patient will contact LCSW once this is complete. See Care Plan below for interventions and patient self-care actives.  Follow up Plan: Patient would like continued follow-up.  CCM LCSW will f/u with patient in 30 to 60 days if no call is received. . Patient will call office if needed prior to next encounter    Review of patient past medical history, allergies, medications, and health status, including review of relevant consultants reports was performed today as part of a comprehensive evaluation and provision of chronic care management and care coordination services.  SDOH (Social Determinants of Health) assessments and interventions performed:    Advanced Directives Status: Not addressed in this encounter.  Care Plan  Allergies  Allergen Reactions  . Vicodin [Hydrocodone-Acetaminophen] Itching    Outpatient Encounter Medications as of 05/27/2020  Medication Sig Note  . albuterol (VENTOLIN HFA) 108 (90 Base) MCG/ACT inhaler INHALE 2 PUFFS INTO THE LUNGS EVERY 4 HOURS AS NEEDED FOR SHORTNESS OF BREATH   . clobetasol cream (TEMOVATE) 0.05 % Apply topically 2 (two) times daily.   Marland Kitchen conjugated estrogens (PREMARIN) vaginal cream INSERT 1 APPLICATORFUL VAGINALLY DAILY FOR 21  DAYS   . cyclobenzaprine (FLEXERIL) 5 MG tablet Take 1 tablet (5 mg total) by mouth at bedtime as needed for muscle spasms.   Marland Kitchen dexlansoprazole (DEXILANT) 60 MG capsule Take 1 capsule (60 mg total) by mouth daily.   Regino Schultze Bandages & Supports (MEDICAL COMPRESSION STOCKINGS) MISC 2 Units by Does not apply route daily.   . fluticasone (FLONASE) 50 MCG/ACT nasal spray Place 1 spray into both nostrils daily. 1 spray in each nostril every day   . furosemide (LASIX) 40 MG tablet TAKE 1 TABLET(40 MG) BY MOUTH DAILY 04/30/2020: As needed  . hydrOXYzine (ATARAX/VISTARIL) 10 MG tablet TAKE 1 TABLET(10 MG) BY MOUTH DAILY AS NEEDED   . levothyroxine (SYNTHROID) 150 MCG tablet TAKE 1 TABLET(150 MCG) BY MOUTH DAILY BEFORE AND BREAKFAST   . ondansetron (ZOFRAN) 4 MG tablet Take 1 tablet (4 mg total) by mouth every 8 (eight) hours as needed.   Marland Kitchen oxyCODONE (OXY IR/ROXICODONE) 5 MG immediate release tablet Take 1 tablet (5 mg total) by mouth every 12 (twelve) hours as needed for severe pain.   . pravastatin (PRAVACHOL) 20 MG tablet Take 1 tablet (20 mg total) by mouth daily.   . pregabalin (LYRICA) 150 MG capsule TAKE 1 CAPSULE BY MOUTH TWICE DAILY.   Marland Kitchen SPIRIVA HANDIHALER 18 MCG inhalation capsule PLACE 1 CAPSULE INTO THE INHALER AND INHALE DAILY AS NEEDED   . traZODone (DESYREL) 50 MG tablet TAKE 1 TABLET(50 MG) BY MOUTH AT BEDTIME    No facility-administered encounter medications on file as of 05/27/2020.    Patient Active Problem List   Diagnosis Date Noted  . Insurance coverage problems 05/01/2020  . 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  Elbow pain, right 04/30/2020  . COVID-19 vaccine series not completed 04/01/2020  . Ear pain 01/29/2020  . Overactive bladder 01/01/2020  . At risk for polypharmacy 12/05/2019  . Vagina bleeding 11/02/2019  . Chronic pain 07/25/2019  . Menopause 03/21/2019  . Chronic diarrhea 01/08/2019  . Vaginal dryness, menopausal 10/29/2016  . Restless legs 02/23/2016  . Back pain  07/02/2015  . 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 GERD (gastroesophageal reflux disease) 06/24/2011  . Obesity (BMI 30-39.9) 01/22/2011  . Anxiety and depression 10/24/2009  . Hyperlipidemia 01/05/2008  . Lymphedema 01/04/2007  . HIDRADENITIS SUPPURATIVA 08/17/2006  . Hypothyroidism 05/05/2006  . History of tobacco use 05/05/2006  . Episodic mood disorder ([place]) 05/05/2006    Conditions to be addressed/monitored: Community Resouces   Care Plan : Clinical Social Work  Updates made by Maurine Cane, LCSW since 05/27/2020 12:00 AM  Problem: need community support   Goal: Connec patient with resources to meet dental and finantial need   Start Date: 05/09/2020  Expected End Date: 09/04/2020  Recent Progress: On track  Priority: High  Note:   Current barriers:   . Patient has not received form for release of information from the dentist.  . Patient needs to call her dentist to provide permission for them to share needed information with me  . Patient in need of assistance with connecting to community resources for applying for Social Security & Medicare . Also need assistance with furniture and dental ( mouth guard and missing teeth) . $545 for dental guard and $1,000 for missing teeth that are impacting her eating) insurance will cover most cost but she is unable to pay her portion. Marland Kitchen Acknowledges deficits with meeting these unmet need . Patient is unable to independently navigate community resource options without care coordination support Clinical Goals: patient will work with SW to address concerns related to unmet needs Clinical Interventions:  . Collaboration with Richarda Osmond, DO regarding development and update of comprehensive plan of care as evidenced by provider attestation and co-signature . Inter-disciplinary care team collaboration (see longitudinal plan of care) . Assessment of needs and barriers  as well as how impacting  with getting the mouth guard. (patient has no provided needed information for LCSW to submit for patient assistance. (patient called dental office with LCSW on the line, explained what is needed, office will submit release of information form to patient.   Dental office will send needed once they have release form. Marland Kitchen LCSW will submit application for [facility] patient assistance program once information is received.  . Review various resources, discussed options and provided patient information Call [place]( patient has not made progress with calling about Medicare) . Collaborated with appropriate clinical care team members regarding the Giving committee to help with dental needs; patient does not qualify)  . Care Guide referral for assistance with furniture  placed  (care guide contacted patient and provided information and resources) .  Problem-solving facilitated; Resource information provided; Support and encouragement provided Patient Goals/Self-Care Activities: Over the next 30 days . Call [facility] [phone] to schedule a phone appointment . Complete release of information from your dentist to share the information needed for the patient assistance program . I will submit once I have all information       Casimer Lanius, [place] / [place]   [phone] 1:45 PM

## 2020-05-27 NOTE — Patient Instructions (Signed)
  Ms. Moultry  it was nice speaking with you. Please call me directly [phone] if you have questions about the goals we discussed. Goals Addressed            This Visit's Progress   . Find Help in My Community   On track    Timeframe:  Long-Range Goal Priority:  High Start Date:     05/09/20                        Expected End Date: 09/04/20                       Patient Goals/Self-Care Activities:  . Call [facility] [phone] to schedule a phone appointment . Complete release of information from your dentist to share the information needed for the patient assistance program . I will submit once I have all information   Why is this important?    Knowing how and where to find help for yourself or family in your neighborhood and community is an important skill.       Ms. Dworkin received Care Coordination services today:  1. Care Coordination services include personalized support from designated clinical staff supervised by her physician, including individualized plan of care and coordination with other care providers 2. 24/7 contact [phone] for assistance for urgent and routine care needs. 3. Care Coordination are voluntary services and be declined at any time by calling the office. Patient verbalizes understanding of instructions provided today.    Follow up plan: f/u with patient in 30 to 60 days. Patient will call office if needed  Maurine Cane, LCSW

## 2020-05-28 ENCOUNTER — Ambulatory Visit: Payer: Medicaid Other | Admitting: Psychology

## 2020-05-30 ENCOUNTER — Encounter: Payer: Self-pay | Admitting: Student in an Organized Health Care Education/Training Program

## 2020-05-30 ENCOUNTER — Ambulatory Visit (INDEPENDENT_AMBULATORY_CARE_PROVIDER_SITE_OTHER): Payer: Medicaid Other | Admitting: Student in an Organized Health Care Education/Training Program

## 2020-05-30 ENCOUNTER — Other Ambulatory Visit: Payer: Self-pay

## 2020-05-30 VITALS — BP 112/82 | HR 72 | Wt 251.6 lb

## 2020-05-30 DIAGNOSIS — G8929 Other chronic pain: Secondary | ICD-10-CM | POA: Diagnosis not present

## 2020-05-30 DIAGNOSIS — N951 Menopausal and female climacteric states: Secondary | ICD-10-CM | POA: Diagnosis not present

## 2020-05-30 DIAGNOSIS — M25521 Pain in right elbow: Secondary | ICD-10-CM

## 2020-05-30 DIAGNOSIS — F32A Depression, unspecified: Secondary | ICD-10-CM

## 2020-05-30 DIAGNOSIS — Z9189 Other specified personal risk factors, not elsewhere classified: Secondary | ICD-10-CM | POA: Diagnosis not present

## 2020-05-30 DIAGNOSIS — M0609 Rheumatoid arthritis without rheumatoid factor, multiple sites: Secondary | ICD-10-CM | POA: Diagnosis not present

## 2020-05-30 DIAGNOSIS — E890 Postprocedural hypothyroidism: Secondary | ICD-10-CM | POA: Diagnosis not present

## 2020-05-30 DIAGNOSIS — N898 Other specified noninflammatory disorders of vagina: Secondary | ICD-10-CM | POA: Diagnosis not present

## 2020-05-30 DIAGNOSIS — M1990 Unspecified osteoarthritis, unspecified site: Secondary | ICD-10-CM

## 2020-05-30 DIAGNOSIS — M545 Low back pain, unspecified: Secondary | ICD-10-CM

## 2020-05-30 DIAGNOSIS — F419 Anxiety disorder, unspecified: Secondary | ICD-10-CM | POA: Diagnosis not present

## 2020-05-30 MED ORDER — QUETIAPINE FUMARATE 50 MG PO TABS
ORAL_TABLET | ORAL | 0 refills | Status: DC
Start: 2020-05-30 — End: 2020-10-07

## 2020-05-30 MED ORDER — PREGABALIN 150 MG PO CAPS: 150.0000 mg | ORAL_CAPSULE | Freq: Two times a day (BID) | ORAL | 0 refills | Status: DC

## 2020-05-30 MED ORDER — POLYETHYLENE GLYCOL 3350 17 GM/SCOOP PO POWD: 17.0000 g | Freq: Two times a day (BID) | ORAL | 1 refills | Status: DC | PRN

## 2020-05-30 MED ORDER — OXYCODONE HCL 5 MG PO TABS: 5.0000 mg | ORAL_TABLET | Freq: Two times a day (BID) | ORAL | 0 refills | Status: DC | PRN

## 2020-05-30 MED ORDER — HYDROXYZINE HCL 10 MG PO TABS
ORAL_TABLET | ORAL | 0 refills | Status: DC
Start: 2020-05-30 — End: 2020-08-27

## 2020-05-30 MED ORDER — FUROSEMIDE 40 MG PO TABS: 40.0000 mg | ORAL_TABLET | Freq: Every day | ORAL | 0 refills | Status: DC

## 2020-05-30 MED ORDER — LEVOTHYROXINE SODIUM 150 MCG PO TABS
ORAL_TABLET | ORAL | 0 refills | Status: DC
Start: 2020-05-30 — End: 2020-09-04

## 2020-05-30 NOTE — Patient Instructions (Addendum)
It was a pleasure to see you today!  To summarize our discussion for this visit:  We did a pelvic exam today that appeared normal. I will let you know the results via mychart  I'm ordering xrays of your spine to assess your arthritis and symptoms. I think you would benefit from physical therapy  I will refill your medications today. Including, I will send in a stool softener for you to try and refilled your prior sleep medication.  Please return to our clinic to see me as needed.  Call the clinic at [phone] if your symptoms worsen or you have any concerns.   Thank you for allowing me to take part in your care,  Dr. Doristine Mango

## 2020-05-30 NOTE — Progress Notes (Signed)
    SUBJECTIVE:   CHIEF COMPLAINT / HPI: bilateral arm pain, vaginal burning,   Vaginal irritation-patient returns today for pelvic exam to evaluate her vaginal irritation which has improved since her last visit.  Denies burning with urination.  Has not tried using topical lubricants as discussed at last visit.  Obesity-patient has started taking prune juice frequently again for weight loss.  Medication management-patient has several refill request today.  Reminded her that the best way to get refills is through her pharmacy as many of her medications already have refills available.  Arm pain-patient states that right elbow pain/numbness is now worsened and is affecting her upper arm as well.  Seems to be positional.  Steroid injection from sports medicine provided only short-term relief.  Insomnia-patient states that trazodone was giving her a lot of dreams and she does not like it.  She prefers to go back on her Seroquel.  Had use that medication long-term without negative side effects.  OBJECTIVE:   BP 112/82   Pulse 72   Wt 251 lb 9.6 oz (114.1 kg)   SpO2 97%   BMI 38.26 kg/m   Physical Exam Exam conducted with a chaperone present.  Constitutional:      General: She is not in acute distress.    Appearance: Normal appearance. She is obese. She is not ill-appearing.  Genitourinary:    General: Normal vulva.     Exam position: Lithotomy position.     Pubic Area: No rash.      Vagina: Normal.     Cervix: Normal.     Comments: Negative for signs of lichenification.  Vaginal tissue appeared normal without discharge or lesions. Musculoskeletal:     Comments: Positive [place].  Strength in bilateral upper extremities is intact  Neurological:     Mental Status: She is alert.    ASSESSMENT/PLAN:   Vaginal dryness, menopausal Normal exam today without signs of lichenification.  Recommend topical lubricants. Wet prep collected to help rule out infection  Elbow pain,  right Patient reports only transient and mild improvement from steroid injection at sports medicine. Symptoms now include numbness radiating from neck and positional.  Reports similar symptoms in her legs which is chronic Obtaining cervical and lumbar x-rays to evaluate for vertebral space narrowing and source of radicular pain  At risk for polypharmacy Refill requested medications today and again requested that patient talk to her pharmacy for further refills in the future     [place]

## 2020-05-31 ENCOUNTER — Encounter: Payer: Self-pay | Admitting: Student in an Organized Health Care Education/Training Program

## 2020-05-31 NOTE — Assessment & Plan Note (Signed)
Refill requested medications today and again requested that patient talk to her pharmacy for further refills in the future

## 2020-05-31 NOTE — Assessment & Plan Note (Signed)
Normal exam today without signs of lichenification.  Recommend topical lubricants. Wet prep collected to help rule out infection

## 2020-05-31 NOTE — Assessment & Plan Note (Signed)
Patient reports only transient and mild improvement from steroid injection at sports medicine. Symptoms now include numbness radiating from neck and positional.  Reports similar symptoms in her legs which is chronic Obtaining cervical and lumbar x-rays to evaluate for vertebral space narrowing and source of radicular pain

## 2020-06-01 DIAGNOSIS — Q820 Hereditary lymphedema: Secondary | ICD-10-CM | POA: Diagnosis not present

## 2020-06-03 ENCOUNTER — Encounter: Payer: Self-pay | Admitting: Student in an Organized Health Care Education/Training Program

## 2020-06-04 ENCOUNTER — Ambulatory Visit
Admission: RE | Admit: 2020-06-04 | Discharge: 2020-06-04 | Disposition: A | Discharge status: Home / Self Care | Payer: Medicaid Other | Source: Ambulatory Visit | Attending: Family Medicine | Admitting: Family Medicine

## 2020-06-04 ENCOUNTER — Ambulatory Visit: Payer: Medicaid Other | Admitting: Licensed Clinical Social Worker

## 2020-06-04 DIAGNOSIS — M542 Cervicalgia: Secondary | ICD-10-CM | POA: Diagnosis not present

## 2020-06-04 DIAGNOSIS — Z789 Other specified health status: Secondary | ICD-10-CM

## 2020-06-04 DIAGNOSIS — M1990 Unspecified osteoarthritis, unspecified site: Secondary | ICD-10-CM

## 2020-06-04 NOTE — Chronic Care Management (AMB) (Signed)
Care Management Clinical Social Work Note  06/04/2020 Name: [name] MRN: [id number] DOB: 10-Jun-1965  [name] is a 55 y.o. year old female who is a primary care patient of Richarda Osmond, DO.  The Care Management team was consulted for assistance with chronic disease management and coordination needs.  Engaged with patient by telephone for follow up visit in response to provider referral for social work care coordination services for community support.  Consent to Services: Patient agreed to services and consent obtained.   Assessment: Patient continues to experience difficulty with getting needed documentation from her dentist office to support the need for a dental guard.Marland KitchenMarland KitchenPatient will continue to outreach to dentist for needed information and contact LCSW.  (See Care Plan below for interventions and patient self-care actives).  Follow up Plan: Patient would like continued follow-up.  CCM LCSW  will f/u with patient in 30 days.. Patient will call office if needed prior to next encounter  Review of patient past medical history, allergies, medications, and health status, including review of relevant consultants reports was performed today as part of a comprehensive evaluation and provision of chronic care management and care coordination services.  SDOH (Social Determinants of Health) assessments and interventions performed:    Advanced Directives Status: Not addressed in this encounter.  Care Plan  Allergies  Allergen Reactions  . Vicodin [Hydrocodone-Acetaminophen] Itching    Outpatient Encounter Medications as of 06/04/2020  Medication Sig  . albuterol (VENTOLIN HFA) 108 (90 Base) MCG/ACT inhaler INHALE 2 PUFFS INTO THE LUNGS EVERY 4 HOURS AS NEEDED FOR SHORTNESS OF BREATH  . clobetasol cream (TEMOVATE) 0.05 % Apply topically 2 (two) times daily.  Marland Kitchen conjugated estrogens (PREMARIN) vaginal cream INSERT 1 APPLICATORFUL VAGINALLY DAILY FOR 21 DAYS  . cyclobenzaprine  (FLEXERIL) 5 MG tablet Take 1 tablet (5 mg total) by mouth at bedtime as needed for muscle spasms.  Marland Kitchen dexlansoprazole (DEXILANT) 60 MG capsule Take 1 capsule (60 mg total) by mouth daily.  Regino Schultze Bandages & Supports (MEDICAL COMPRESSION STOCKINGS) MISC 2 Units by Does not apply route daily.  . fluticasone (FLONASE) 50 MCG/ACT nasal spray Place 1 spray into both nostrils daily. 1 spray in each nostril every day  . furosemide (LASIX) 40 MG tablet Take 1 tablet (40 mg total) by mouth daily.  . hydrOXYzine (ATARAX/VISTARIL) 10 MG tablet TAKE 1 TABLET(10 MG) BY MOUTH DAILY AS NEEDED  . levothyroxine (SYNTHROID) 150 MCG tablet TAKE 1 TABLET(150 MCG) BY MOUTH DAILY BEFORE AND BREAKFAST  . ondansetron (ZOFRAN) 4 MG tablet Take 1 tablet (4 mg total) by mouth every 8 (eight) hours as needed.  Derrill Memo ON 06/06/2020] oxyCODONE (OXY IR/ROXICODONE) 5 MG immediate release tablet Take 1 tablet (5 mg total) by mouth every 12 (twelve) hours as needed for severe pain.  . polyethylene glycol powder (GLYCOLAX/MIRALAX) 17 GM/SCOOP powder Take 17 g by mouth 2 (two) times daily as needed.  . pravastatin (PRAVACHOL) 20 MG tablet Take 1 tablet (20 mg total) by mouth daily.  . pregabalin (LYRICA) 150 MG capsule Take 1 capsule (150 mg total) by mouth 2 (two) times daily.  . QUEtiapine (SEROQUEL) 50 MG tablet TAKE 1 TABLET(50 MG) BY MOUTH AT BEDTIME  . SPIRIVA HANDIHALER 18 MCG inhalation capsule PLACE 1 CAPSULE INTO THE INHALER AND INHALE DAILY AS NEEDED   No facility-administered encounter medications on file as of 06/04/2020.    Patient Active Problem List   Diagnosis Date Noted  . Insurance coverage problems 05/01/2020  .  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 Elbow pain, right 04/30/2020  . COVID-19 vaccine series not completed 04/01/2020  . Ear pain 01/29/2020  . Overactive bladder 01/01/2020  . At risk for polypharmacy 12/05/2019  . Vagina bleeding 11/02/2019  . Chronic pain 07/25/2019  . Menopause 03/21/2019  .  Chronic diarrhea 01/08/2019  . Vaginal dryness, menopausal 10/29/2016  . Restless legs 02/23/2016  . Back pain 07/02/2015  . 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 GERD (gastroesophageal reflux disease) 06/24/2011  . Obesity (BMI 30-39.9) 01/22/2011  . Anxiety and depression 10/24/2009  . Hyperlipidemia 01/05/2008  . Lymphedema 01/04/2007  . HIDRADENITIS SUPPURATIVA 08/17/2006  . Hypothyroidism 05/05/2006  . History of tobacco use 05/05/2006  . Episodic mood disorder ([place]) 05/05/2006    Conditions to be addressed/monitored: Financial constraints related to fixed income and Lacks knowledge of community resource: for dental needs  Care Plan : Clinical Social Work  Updates made by Maurine Cane, LCSW since 06/04/2020 12:00 AM  Problem: need community support   Goal: Connec patient with resources to meet dental and finantial need   Start Date: 05/09/2020  Expected End Date: 09/04/2020  Recent Progress: On track  Priority: High  Current barriers:   . Patient has not received release of information from the dentist. ( Dr. Everlean Cherry, [place] for the [facility]    [place] [phone] . Patient in need of assistance with connecting to community resources for applying for Social Security, Medicare and assistance with dental ( mouth guard) . $545 for dental guard ) insurance will cover most cost but she is unable to pay her portion. Marland Kitchen Acknowledges deficits with meeting these unmet need . Patient is unable to independently navigate community resource options without care coordination support Clinical Goals: patient will work with SW to address concerns related to unmet needs Clinical Interventions:  . Collaboration with Richarda Osmond, DO regarding development and update of comprehensive plan of care as evidenced by provider attestation and co-signature . Inter-disciplinary care team collaboration (see longitudinal plan of  care) . Assessment of needs and barriers  as well as how impacting with getting the mouth guard. (patient has no provided needed information for LCSW to submit for patient assistance. (patient called dental office with LCSW on the line, explained what is needed, office will submit release of information form to patient.   Dental office will send needed once they have release form. Marland Kitchen LCSW will submit application for [facility] patient assistance program once information is received.  . Review various resources, discussed options and provided patient information Call [place]( patient has not made progress with calling about Medicare) .  Problem-solving facilitated; Resource information provided; Support and encouragement provided Patient Goals/Self-Care Activities: Over the next 30 days . Call [place] [phone] to schedule a phone appointment . Complete release of information from your dentist to share the information needed for the patient assistance program . I will submit once I have all information      Casimer Lanius, [place] / [place]   [phone] 1:54 PM

## 2020-06-04 NOTE — Patient Instructions (Signed)
  Ms. Kindler  it was nice speaking with you. Please call me directly if you have questions about the goals we discussed. Goals Addressed            This Visit's Progress   . Find Help in My Community   Not on track    Timeframe:  Long-Range Goal Priority:  High Start Date:     05/09/20                        Expected End Date: 09/04/20                       Patient Goals/Self-Care Activities:  . Call [place] [phone] to schedule a phone appointment . Complete release of information from your dentist to share the information needed for the patient assistance program . I will submit once I have all information   Why is this important?    Knowing how and where to find help for yourself or family in your neighborhood and community is an important skill.       Ms. Tech received Care Coordination services today:  1. Care Coordination services include personalized support from designated clinical staff supervised by her physician, including individualized plan of care and coordination with other care providers 2. 24/7 contact [phone] for assistance for urgent and routine care needs. 3. Care Coordination are voluntary services and be declined at any time by calling the office.   Patient verbalizes understanding of instructions provided today.    Follow up plan: SW will follow up with patient by phone over the next 30 days. Patient will call LCSW once she recieves information  from dentist  Maurine Cane, [place]

## 2020-06-05 ENCOUNTER — Other Ambulatory Visit: Payer: Self-pay | Admitting: Student in an Organized Health Care Education/Training Program

## 2020-06-05 ENCOUNTER — Telehealth: Payer: Self-pay

## 2020-06-05 NOTE — Telephone Encounter (Signed)
Patient calls nurse line requesting to speak with provider regarding results from recent imaging.   Please advise.   Talbot Grumbling, RN

## 2020-06-06 NOTE — Telephone Encounter (Signed)
Patient calls nurse line again requesting to speak with PCP about results.

## 2020-06-10 ENCOUNTER — Ambulatory Visit: Payer: Medicaid Other | Admitting: Licensed Clinical Social Worker

## 2020-06-10 DIAGNOSIS — Z789 Other specified health status: Secondary | ICD-10-CM

## 2020-06-10 NOTE — Chronic Care Management (AMB) (Signed)
Care Management Clinical Social Work Note  06/10/2020 Name: [name] MRN: [id number] DOB: [birth year]  [name] is a 55 y.o. year old female who is a primary care patient of Richarda Osmond, DO.  The Care Management team was consulted for assistance with chronic disease management and coordination needs.  Engaged with patient by telephone for follow up visit in response to provider referral for social work chronic care management and care coordination services  Consent to Services: Patient agreed to services and consent obtained.   Assessment:  Patient received release of information form from dentist office today. Reviewed form with LCSW and will mail back to dentist office. See Care Plan below for interventions and patient self-care actives.  Follow up Plan: LCSW will wait for dentist office to send needed information so LCSW can request  assistance to cover the dental guard. Will f/u in 30 if nothing is reviewed.   Review of patient past medical history, allergies, medications, and health status, including review of relevant consultants reports was performed today as part of a comprehensive evaluation and provision of chronic care management and care coordination services.  SDOH (Social Determinants of Health) assessments and interventions performed:    Advanced Directives Status: Not addressed in this encounter.  Care Plan  Allergies  Allergen Reactions  . Vicodin [Hydrocodone-Acetaminophen] Itching    Outpatient Encounter Medications as of 06/10/2020  Medication Sig  . albuterol (VENTOLIN HFA) 108 (90 Base) MCG/ACT inhaler INHALE 2 PUFFS INTO THE LUNGS EVERY 4 HOURS AS NEEDED FOR SHORTNESS OF BREATH  . clobetasol cream (TEMOVATE) 0.05 % Apply topically 2 (two) times daily.  Marland Kitchen conjugated estrogens (PREMARIN) vaginal cream INSERT 1 APPLICATORFUL VAGINALLY DAILY FOR 21 DAYS  . cyclobenzaprine (FLEXERIL) 5 MG tablet Take 1 tablet (5 mg total) by mouth at bedtime as needed  for muscle spasms.  Marland Kitchen dexlansoprazole (DEXILANT) 60 MG capsule Take 1 capsule (60 mg total) by mouth daily.  Regino Schultze Bandages & Supports (MEDICAL COMPRESSION STOCKINGS) MISC 2 Units by Does not apply route daily.  . fluticasone (FLONASE) 50 MCG/ACT nasal spray Place 1 spray into both nostrils daily. 1 spray in each nostril every day  . furosemide (LASIX) 40 MG tablet Take 1 tablet (40 mg total) by mouth daily.  . hydrOXYzine (ATARAX/VISTARIL) 10 MG tablet TAKE 1 TABLET(10 MG) BY MOUTH DAILY AS NEEDED  . levothyroxine (SYNTHROID) 150 MCG tablet TAKE 1 TABLET(150 MCG) BY MOUTH DAILY BEFORE AND BREAKFAST  . ondansetron (ZOFRAN) 4 MG tablet Take 1 tablet (4 mg total) by mouth every 8 (eight) hours as needed.  Marland Kitchen oxyCODONE (OXY IR/ROXICODONE) 5 MG immediate release tablet Take 1 tablet (5 mg total) by mouth every 12 (twelve) hours as needed for severe pain.  . polyethylene glycol powder (GLYCOLAX/MIRALAX) 17 GM/SCOOP powder Take 17 g by mouth 2 (two) times daily as needed.  . pravastatin (PRAVACHOL) 20 MG tablet Take 1 tablet (20 mg total) by mouth daily.  . pregabalin (LYRICA) 150 MG capsule Take 1 capsule (150 mg total) by mouth 2 (two) times daily.  . QUEtiapine (SEROQUEL) 50 MG tablet TAKE 1 TABLET(50 MG) BY MOUTH AT BEDTIME  . SPIRIVA HANDIHALER 18 MCG inhalation capsule PLACE 1 CAPSULE INTO THE INHALER AND INHALE DAILY AS NEEDED   No facility-administered encounter medications on file as of 06/10/2020.    Patient Active Problem List   Diagnosis Date Noted  . Insurance coverage problems 05/01/2020  . 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 Elbow pain, right 04/30/2020  .  COVID-19 vaccine series not completed 04/01/2020  . Ear pain 01/29/2020  . Overactive bladder 01/01/2020  . At risk for polypharmacy 12/05/2019  . Vagina bleeding 11/02/2019  . Chronic pain 07/25/2019  . Menopause 03/21/2019  . Chronic diarrhea 01/08/2019  . Vaginal dryness, menopausal 10/29/2016  . Restless legs 02/23/2016  .  Back pain 07/02/2015  . 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 GERD (gastroesophageal reflux disease) 06/24/2011  . Obesity (BMI 30-39.9) 01/22/2011  . Anxiety and depression 10/24/2009  . Hyperlipidemia 01/05/2008  . Lymphedema 01/04/2007  . HIDRADENITIS SUPPURATIVA 08/17/2006  . Hypothyroidism 05/05/2006  . History of tobacco use 05/05/2006  . Episodic mood disorder ([place]) 05/05/2006    Conditions to be addressed/monitored: community resource: for dental needs  Care Plan : Clinical Social Work  Updates made by Maurine Cane, LCSW since 06/10/2020 12:00 AM  Problem: need community support   Goal: Connec patient with resources to meet dental and finantial need   Start Date: 05/09/2020  Expected End Date: 09/04/2020  This Visit's Progress: On track  Recent Progress: On track  Priority: High  Current barriers:   . Patient has not received release of information from the dentist. ( Dr. Everlean Cherry, [place] for the [place]    [place] [phone] . Patient in need of assistance with connecting to community resources for applying for Social Security, Medicare and assistance with dental ( mouth guard) . $545 for dental guard ) insurance will cover most cost but she is unable to pay her portion. Marland Kitchen Acknowledges deficits with meeting these unmet need . Patient is unable to independently navigate community resource options without care coordination support Clinical Goals: patient will work with SW to address concerns related to unmet needs Clinical Interventions:  . Collaboration with Richarda Osmond, DO regarding development and update of comprehensive plan of care as evidenced by provider attestation and co-signature . Inter-disciplinary care team collaboration (see longitudinal plan of care) . Assessment of needs and barriers  as well as how impacting with getting the mouth guard. (patient has no provided needed information  for LCSW to submit for patient assistance. (patient called dental office with LCSW on the line, explained what is needed, office will submit release of information form to patient.   Dental office will send needed once they have release form. Marland Kitchen LCSW will submit application for [facility] patient assistance program once information is received.  . Review various resources, discussed options and provided patient information Call [place]( patient has not made progress with calling about Medicare) .  Problem-solving facilitated; Resource information provided; Support and encouragement provided Patient Goals/Self-Care Activities: Over the next 30 days . Call [place] [phone] to schedule a phone appointment . Complete release of information from your dentist to share the information needed for the patient assistance program . I will submit once I have all information      Casimer Lanius, [place] / [place]   [phone] 4:18 PM

## 2020-06-10 NOTE — Patient Instructions (Signed)
  Ms. Perillo  it was nice speaking with you. Please call me directly if you have questions about the goals we discussed. Goals Addressed            This Visit's Progress   . Find Help in My Community       Timeframe:  Long-Range Goal Priority:  High Start Date:     05/09/20                        Expected End Date: 09/04/20                        Patient Goals/Self-Care Activities:  . Call [place] [phone] to schedule a phone appointment . Complete release of information from your dentist to share the information needed for the patient assistance program . I will submit once I have all information   Why is this important?    Knowing how and where to find help for yourself or family in your neighborhood and community is an important skill.       Ms. Urda received Care Coordination services today:  1. Care Coordination services include personalized support from designated clinical staff supervised by her physician, including individualized plan of care and coordination with other care providers 2. 24/7 contact [phone] for assistance for urgent and routine care needs. 3. Care Coordination are voluntary services and be declined at any time by calling the office.  Patient verbalizes understanding of instructions provided today.    Follow up plan: SW will follow up with patient by phone over the next 30 days   Maurine Cane, [place]

## 2020-06-12 ENCOUNTER — Ambulatory Visit: Payer: Medicaid Other | Admitting: Student in an Organized Health Care Education/Training Program

## 2020-06-13 NOTE — Telephone Encounter (Signed)
Completed phone call with patient to discuss results and plan

## 2020-06-17 ENCOUNTER — Ambulatory Visit: Payer: Medicaid Other | Admitting: Psychology

## 2020-06-18 ENCOUNTER — Other Ambulatory Visit: Payer: Self-pay

## 2020-06-18 MED ORDER — DEXLANSOPRAZOLE 60 MG PO CPDR: 60.0000 mg | DELAYED_RELEASE_CAPSULE | Freq: Every day | ORAL | 5 refills | Status: DC

## 2020-06-19 ENCOUNTER — Telehealth: Payer: Self-pay | Admitting: *Deleted

## 2020-06-19 NOTE — Telephone Encounter (Signed)
FYI:   Patient would like to make provider aware that she hit her leg on the bed frame and it is bleeding.  Patient states that she used a "maxi" pad as a bandage along with the wraps she uses for her lymphedema.  She wanted an urgent appt tomorrow but I informed her that we were closed.  Patient voiced understanding and knows that she should be seen in the urgent care whether or not if stops bleeding.  She will go today if it doesn't stop in the next 30 minutes but tomorrow if it stops. Daisia Slomski,CMA

## 2020-06-20 ENCOUNTER — Other Ambulatory Visit: Payer: Self-pay

## 2020-06-20 ENCOUNTER — Encounter (HOSPITAL_BASED_OUTPATIENT_CLINIC_OR_DEPARTMENT_OTHER): Payer: Self-pay | Admitting: Emergency Medicine

## 2020-06-20 ENCOUNTER — Emergency Department (HOSPITAL_BASED_OUTPATIENT_CLINIC_OR_DEPARTMENT_OTHER)
Admission: EM | Admit: 2020-06-20 | Discharge: 2020-06-20 | Disposition: A | Discharge status: Home / Self Care | Payer: Medicaid Other | Attending: Emergency Medicine | Admitting: Emergency Medicine

## 2020-06-20 ENCOUNTER — Ambulatory Visit (HOSPITAL_COMMUNITY)
Admission: EM | Admit: 2020-06-20 | Discharge: 2020-06-20 | Disposition: A | Discharge status: Home / Self Care | Payer: Medicaid Other

## 2020-06-20 ENCOUNTER — Encounter (HOSPITAL_COMMUNITY): Payer: Self-pay

## 2020-06-20 DIAGNOSIS — W2209XA Striking against other stationary object, initial encounter: Secondary | ICD-10-CM | POA: Insufficient documentation

## 2020-06-20 DIAGNOSIS — S8991XA Unspecified injury of right lower leg, initial encounter: Secondary | ICD-10-CM | POA: Diagnosis present

## 2020-06-20 DIAGNOSIS — Z23 Encounter for immunization: Secondary | ICD-10-CM | POA: Diagnosis not present

## 2020-06-20 DIAGNOSIS — S81811A Laceration without foreign body, right lower leg, initial encounter: Secondary | ICD-10-CM | POA: Insufficient documentation

## 2020-06-20 DIAGNOSIS — R600 Localized edema: Secondary | ICD-10-CM | POA: Insufficient documentation

## 2020-06-20 DIAGNOSIS — J449 Chronic obstructive pulmonary disease, unspecified: Secondary | ICD-10-CM | POA: Insufficient documentation

## 2020-06-20 DIAGNOSIS — Z87891 Personal history of nicotine dependence: Secondary | ICD-10-CM | POA: Insufficient documentation

## 2020-06-20 DIAGNOSIS — E039 Hypothyroidism, unspecified: Secondary | ICD-10-CM | POA: Insufficient documentation

## 2020-06-20 DIAGNOSIS — Z79899 Other long term (current) drug therapy: Secondary | ICD-10-CM | POA: Diagnosis not present

## 2020-06-20 MED ORDER — TETANUS-DIPHTH-ACELL PERTUSSIS 5-2.5-18.5 LF-MCG/0.5 IM SUSY
0.5000 mL | PREFILLED_SYRINGE | Freq: Once | INTRAMUSCULAR | Status: AC
  Administered 2020-06-20: 0.5 mL via INTRAMUSCULAR
  Filled 2020-06-20: qty 0.5

## 2020-06-20 MED ORDER — MORPHINE SULFATE (PF) 4 MG/ML IV SOLN
4.0000 mg | Freq: Once | INTRAVENOUS | Status: AC
  Administered 2020-06-20: 4 mg via INTRAMUSCULAR
  Filled 2020-06-20: qty 1

## 2020-06-20 NOTE — Discharge Instructions (Signed)
Your history and examination today are consistent with a skin tear from the edge of your bed last night.  After examination of the wound, it did not appear amenable to sutures as it was more of a skin tear than a laceration.  After cleaning, using antibiotic ointment, and dressing with a skin tear bandage, we feel you are safe for discharge home.  Your tetanus was updated.  Please watch for signs and symptoms of infection and continue your home pain regimen.  If any symptoms change or worsen, please return to the nearest emergency department.

## 2020-06-20 NOTE — ED Notes (Signed)
Triage was not completed as pt went to the ED, as she needs pain medications as we can not provide at this location.

## 2020-06-20 NOTE — ED Triage Notes (Signed)
Skin tear on lower right leg, hit it against bed rail. Was at [facility] today. Sent here for evaluation, bleeding controlled.

## 2020-06-20 NOTE — ED Notes (Addendum)
Skin tear to right lower extremity cleansed with NS & pat dry.  Bacitracin applied and covered with Mepitel and wrapped with Kerlix as per EDP instructions.

## 2020-06-20 NOTE — ED Provider Notes (Signed)
[facility] DEPT Provider Note   CSN: [id number] Arrival date & time: 06/20/20  1115     History Chief Complaint  Patient presents with  . Extremity Laceration    Right leg    [name] is a 55 y.o. female.  The history is provided by the patient and medical records. No language interpreter was used.  Laceration Location:  Leg Leg laceration location:  R lower leg Length:  3 Depth:  Cutaneous Quality: avulsion   Quality comment:  Skin tear Bleeding: controlled   Time since incident:  1 day Laceration mechanism:  Fall and metal edge Pain details:    Quality:  Aching   Severity:  Severe   Timing:  Constant   Progression:  Unchanged Foreign body present:  Unable to specify Relieved by:  Nothing Worsened by:  Pressure Ineffective treatments:  None tried Tetanus status:  Unknown Associated symptoms: numbness (at site but not distal)   Associated symptoms: no fever, no focal weakness, no rash, no redness, no swelling and no streaking        Past Medical History:  Diagnosis Date  . Allergy   . Anxiety   . Arthritis    knees,hands, hip  pt. Denies  . Cellulitis   . Colon polyps 2013   SESSIL SERRATED ADENOMA (ALL FRAGMENTS)  . Complication of anesthesia    Lowered BP had to keep pt. overnight  . COPD (chronic obstructive pulmonary disease) ([place])   . Depression   . Fibroadenosis breast   . GERD (gastroesophageal reflux disease)    does not take med  . Heart murmur    as child  pt. denies  . Hyperlipidemia    no meds - diet controlled  . Hypothyroidism   . Lymph edema    both legs and feet  . Migraine   . Plantar fasciitis   . Syphilis Treated in early 2012 during hospitalization  . Uterine fibroid   . Varicose veins     Patient Active Problem List   Diagnosis Date Noted  . Insurance coverage problems 05/01/2020  . 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 Elbow pain, right 04/30/2020  . COVID-19 vaccine series not completed  04/01/2020  . Ear pain 01/29/2020  . Overactive bladder 01/01/2020  . At risk for polypharmacy 12/05/2019  . Vagina bleeding 11/02/2019  . Chronic pain 07/25/2019  . Menopause 03/21/2019  . Chronic diarrhea 01/08/2019  . Vaginal dryness, menopausal 10/29/2016  . Restless legs 02/23/2016  . Back pain 07/02/2015  . 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 GERD (gastroesophageal reflux disease) 06/24/2011  . Obesity (BMI 30-39.9) 01/22/2011  . Anxiety and depression 10/24/2009  . Hyperlipidemia 01/05/2008  . Lymphedema 01/04/2007  . HIDRADENITIS SUPPURATIVA 08/17/2006  . Hypothyroidism 05/05/2006  . History of tobacco use 05/05/2006  . Episodic mood disorder ([place]) 05/05/2006    Past Surgical History:  Procedure Laterality Date  . COLONOSCOPY  2013  . cyst removed     behind left knee and left cheek  . ENDOMETRIAL ABLATION    . ESOPHAGOGASTRODUODENOSCOPY  2013   normal  . EYE SURGERY     bilarteral  . HYDRADENITIS EXCISION Right 09/13/2017   Procedure: EXCISION HIDRADENITIS GROIN;  Surgeon: Clovis Riley, MD;  Location: [facility];  Service: General;  Laterality: Right;  LMA  . INCISION AND DRAINAGE ABSCESS / HEMATOMA OF BURSA / KNEE / THIGH     right upper thigh - 2008  . NORPLANT  REMOVAL     right upper arm - 12/2001  . svd     x 2  . THYROID SURGERY  2004   removed  . TUBAL LIGATION     2003  . WISDOM TOOTH EXTRACTION       OB History    Gravida  4   Para  2   Term  1   Preterm  1   AB  2   Living  2     SAB      IAB  2   Ectopic      Multiple      Live Births              Family History  Problem Relation Age of Onset  . Diabetes Father   . Heart disease Father   . Hyperlipidemia Father   . Hypertension Father   . Diabetes Mother   . Hypertension Mother   . Hyperlipidemia Mother   . Diabetes Paternal Grandmother   . Colon cancer Neg Hx   . Esophageal cancer Neg Hx   . Rectal cancer Neg Hx   .  Stomach cancer Neg Hx     Social History   Tobacco Use  . Smoking status: Former Smoker    Packs/day: 0.50    Years: 25.00    Pack years: 12.50    Types: Cigarettes    Quit date: 06/30/2012    Years since quitting: 7.9  . Smokeless tobacco: Never Used  Vaping Use  . Vaping Use: Never used  Substance Use Topics  . Alcohol use: Not Currently    Comment: rare  . Drug use: Not Currently    Types: Marijuana    Comment: past hx 6yrs ago    Home Medications Prior to Admission medications   Medication Sig Start Date End Date Taking? Authorizing Provider  albuterol (VENTOLIN HFA) 108 (90 Base) MCG/ACT inhaler INHALE 2 PUFFS INTO THE LUNGS EVERY 4 HOURS AS NEEDED FOR SHORTNESS OF BREATH 06/09/20   Anderson, Chelsey L, DO  clobetasol cream (TEMOVATE) 0.05 % Apply topically 2 (two) times daily. 10/31/19   Anderson, Chelsey L, DO  conjugated estrogens (PREMARIN) vaginal cream INSERT 1 APPLICATORFUL VAGINALLY DAILY FOR 21 DAYS 05/19/20   Doristine Mango L, DO  cyclobenzaprine (FLEXERIL) 5 MG tablet Take 1 tablet (5 mg total) by mouth at bedtime as needed for muscle spasms. 04/30/20   Ouida Sills, Chelsey L, DO  dexlansoprazole (DEXILANT) 60 MG capsule Take 1 capsule (60 mg total) by mouth daily. 06/18/20   Levin Erp, PA  Elastic Bandages & Supports (MEDICAL COMPRESSION STOCKINGS) MISC 2 Units by Does not apply route daily. 12/04/19   Anderson, Chelsey L, DO  fluticasone (FLONASE) 50 MCG/ACT nasal spray Place 1 spray into both nostrils daily. 1 spray in each nostril every day 01/28/20   Doristine Mango L, DO  furosemide (LASIX) 40 MG tablet Take 1 tablet (40 mg total) by mouth daily. 05/30/20   Anderson, Chelsey L, DO  hydrOXYzine (ATARAX/VISTARIL) 10 MG tablet TAKE 1 TABLET(10 MG) BY MOUTH DAILY AS NEEDED 05/30/20   Doristine Mango L, DO  levothyroxine (SYNTHROID) 150 MCG tablet TAKE 1 TABLET(150 MCG) BY MOUTH DAILY BEFORE AND BREAKFAST 05/30/20   Anderson, Chelsey L, DO  ondansetron  (ZOFRAN) 4 MG tablet Take 1 tablet (4 mg total) by mouth every 8 (eight) hours as needed. 04/30/20   Anderson, Chelsey L, DO  oxyCODONE (OXY IR/ROXICODONE) 5 MG immediate release tablet Take 1 tablet (  5 mg total) by mouth every 12 (twelve) hours as needed for severe pain. 06/06/20 07/06/20  Anderson, Chelsey L, DO  polyethylene glycol powder (GLYCOLAX/MIRALAX) 17 GM/SCOOP powder Take 17 g by mouth 2 (two) times daily as needed. 05/30/20   Anderson, Chelsey L, DO  pravastatin (PRAVACHOL) 20 MG tablet Take 1 tablet (20 mg total) by mouth daily. 12/27/19   Anderson, Chelsey L, DO  pregabalin (LYRICA) 150 MG capsule Take 1 capsule (150 mg total) by mouth 2 (two) times daily. 05/30/20   Anderson, Chelsey L, DO  QUEtiapine (SEROQUEL) 50 MG tablet TAKE 1 TABLET(50 MG) BY MOUTH AT BEDTIME 05/30/20   Anderson, Chelsey L, DO  SPIRIVA HANDIHALER 18 MCG inhalation capsule PLACE 1 CAPSULE INTO THE INHALER AND INHALE DAILY AS NEEDED 04/08/20   Doristine Mango L, DO    Allergies    Vicodin [hydrocodone-acetaminophen]  Review of Systems   Review of Systems  Constitutional: Negative for chills, diaphoresis, fatigue and fever.  HENT: Negative for congestion.   Respiratory: Negative for cough, chest tightness, shortness of breath and wheezing.   Cardiovascular: Negative for chest pain.  Gastrointestinal: Negative for abdominal pain.  Genitourinary: Negative for dysuria and flank pain.  Musculoskeletal: Negative for back pain, neck pain and neck stiffness.  Skin: Positive for wound. Negative for rash.  Neurological: Negative for dizziness, focal weakness, weakness, light-headedness and headaches.  Psychiatric/Behavioral: Negative for agitation.  All other systems reviewed and are negative.   Physical Exam Updated Vital Signs BP 134/85 (BP Location: Right Arm)   Pulse 65   Temp 99 F (37.2 C) (Oral)   Resp 20   Ht 5\' 8"  (1.727 m)   Wt 113.4 kg   SpO2 100%   BMI 38.01 kg/m   Physical Exam Vitals and  nursing note reviewed.  Constitutional:      General: She is not in acute distress.    Appearance: She is well-developed. She is not ill-appearing, toxic-appearing or diaphoretic.  HENT:     Head: Normocephalic and atraumatic.     Mouth/Throat:     Mouth: Mucous membranes are moist.  Eyes:     Conjunctiva/sclera: Conjunctivae normal.  Cardiovascular:     Rate and Rhythm: Normal rate and regular rhythm.     Heart sounds: No murmur heard.   Pulmonary:     Effort: Pulmonary effort is normal. No respiratory distress.     Breath sounds: Normal breath sounds. No wheezing, rhonchi or rales.  Chest:     Chest wall: No tenderness.  Abdominal:     Palpations: Abdomen is soft.     Tenderness: There is no abdominal tenderness. There is no right CVA tenderness, left CVA tenderness, guarding or rebound.  Musculoskeletal:        General: Tenderness present.     Cervical back: Neck supple.     Right lower leg: Laceration (skin tear) and tenderness present. Edema present.     Left lower leg: Edema present.       Legs:     Comments: Chronic edema both legs.  Intact pulses, sensation, and strength distally.  Skin:    General: Skin is warm and dry.     Findings: No erythema or rash.  Neurological:     Mental Status: She is alert.     Sensory: Sensory deficit (at wound site) present.     Motor: No weakness.  Psychiatric:        Mood and Affect: Mood normal.     ED Results /  Procedures / Treatments   Labs (all labs ordered are listed, but only abnormal results are displayed) Labs Reviewed - No data to display  EKG None  Radiology No results found.  Procedures Procedures   Medications Ordered in ED Medications  morphine 4 MG/ML injection 4 mg (4 mg Intramuscular Given 06/20/20 1226)  Tdap (BOOSTRIX) injection 0.5 mL (0.5 mLs Intramuscular Given 06/20/20 1225)    ED Course  I have reviewed the triage vital signs and the nursing notes.  Pertinent labs & imaging results that  were available during my care of the patient were reviewed by me and considered in my medical decision making (see chart for details).    MDM Rules/Calculators/A&P                          [name] is a 55 y.o. female with a past medical history significant for GERD, lymphedema, hyperlipidemia, hypothyroidism, COPD, and arthritis who presents with leg injury.  She reports that last night, she slipped and scraped her right shin on a bed frame causing sudden onset of skin tear and bleeding.  She reports that she wrapped it with maxipads and thought it would do well.  She reports that today, her pain had worsened and she came in for evaluation.  She reports her tetanus is likely out of date.  She is denying any pain deeper but has superficial pain.  She reports some numbness in the skin that flapped up.  She denies any fevers, chills, chest pain, shortness of breath, or other complaints.  She reports the pain was severe initially.  She reports she takes chronic pain medicine for chronic pain from previous trauma.  She denies numbness, tingling, weakness distal to the wound in the foot.  On exam, patient has a skin tear to her right shin that is approximately 4 cm in size.  It is a flap that is layered back over on itself.  There is no evidence of deep laceration.  It is hemostatic now.  There is some overlying tenderness  and numbness on the flap tissue but otherwise no surrounding erythema or crepitance.  No bony tenderness otherwise.  Normal sensation and strength distally.  Good pulses present.  Lungs clear and chest nontender.  Exam otherwise unremarkable.  Patient had her wound cleaned, covered investors, and dressed with a skin tear bandage.  Tetanus was updated.  We offered x-ray of the underlying tissue but  she did not want it.  Low suspicion for bony injury or any foreign body as this was a metal scrape.  After wound was dressed, patient will be discharged home to continue taking outpatient  pain medicine and watch for infection.  Do not feel she needs antibiotics at this time given the appearance and nature of the wound.  Tetanus was updated.  Patient given 1 dose of IM pain medicine to help with her discomfort initially.  Patient discharged in good condition with outpatient follow-up.   Final Clinical Impression(s) / ED Diagnoses Final diagnoses:  Skin tear of right lower leg without complication, initial encounter    Rx / DC Orders ED Discharge Orders    None      Clinical Impression: 1. Skin tear of right lower leg without complication, initial encounter     Disposition: Discharge  Condition: Good  I have discussed the results, Dx and Tx plan with the pt(& family if present). He/she/they expressed understanding and agree(s) with the plan.  Discharge instructions discussed at great length. Strict return precautions discussed and pt &/or family have verbalized understanding of the instructions. No further questions at time of discharge.    New Prescriptions   No medications on file    Follow Up: [facility] [address] [place] [id number] [phone] Schedule an appointment as soon as possible for a visit    [facility] [place] [address] [phone]       Maryclaire Stoecker, Gwenyth Allegra, MD 06/20/20 1625

## 2020-06-20 NOTE — ED Triage Notes (Signed)
Pt reports having a wound in the right leg after scratched with a metal part of the bed. Pt reports pain and bleeding form the wound. Oxycodone gives no relief.   Pt can not remember the last time she had Tdap immunization.

## 2020-06-26 ENCOUNTER — Ambulatory Visit: Payer: Medicaid Other | Attending: Family Medicine

## 2020-06-26 ENCOUNTER — Ambulatory Visit: Payer: Medicaid Other | Admitting: Licensed Clinical Social Worker

## 2020-06-26 ENCOUNTER — Other Ambulatory Visit: Payer: Self-pay

## 2020-06-26 DIAGNOSIS — M79602 Pain in left arm: Secondary | ICD-10-CM | POA: Insufficient documentation

## 2020-06-26 DIAGNOSIS — G8929 Other chronic pain: Secondary | ICD-10-CM | POA: Insufficient documentation

## 2020-06-26 DIAGNOSIS — M79601 Pain in right arm: Secondary | ICD-10-CM | POA: Diagnosis present

## 2020-06-26 DIAGNOSIS — R2689 Other abnormalities of gait and mobility: Secondary | ICD-10-CM | POA: Insufficient documentation

## 2020-06-26 DIAGNOSIS — M6281 Muscle weakness (generalized): Secondary | ICD-10-CM | POA: Diagnosis not present

## 2020-06-26 DIAGNOSIS — M545 Low back pain, unspecified: Secondary | ICD-10-CM | POA: Diagnosis present

## 2020-06-26 DIAGNOSIS — Z7189 Other specified counseling: Secondary | ICD-10-CM
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
## 2020-06-26 NOTE — Chronic Care Management (AMB) (Signed)
Care Management   Clinical Social Work Note  06/26/2020 Name: [name] MRN: [id number] DOB: 23-Jan-1966  [name] is a 55 y.o. year old female who is a primary care patient of Richarda Osmond, DO. The CCM team was consulted to assist the patient with chronic disease management and/or care coordination needs related to: [employer] .   Engaged with patient by telephone for follow up visit in response to provider referral for social work chronic care management and care coordination services.   Consent to Services:  The patient was given information about Chronic Care Management services, agreed to services, and gave verbal consent prior to initiation of services.  Please see initial visit note for detailed documentation.   Patient agreed to services and consent obtained.   Assessment: Patient continues to experience difficulty with getting needed information so that  LCSW can assist with dental needs... See Care Plan below for interventions and patient self-care actives. Recent life changes [place] [facility]: Dentis office has not provided information   Follow up Plan: Patient would like continued follow-up.  CCM LCSW will follow up with patient in 2 weeks. Patient will call office if needed prior to next encounter.   Review of patient past medical history, allergies, medications, and health status, including review of relevant consultants reports was performed today as part of a comprehensive evaluation and provision of chronic care management and care coordination services.     SDOH (Social Determinants of Health) assessments and interventions performed:    Advanced Directives Status: See Care Plan for related entries.  CCM Care Plan  Allergies  Allergen Reactions  . Vicodin [Hydrocodone-Acetaminophen] Itching    Outpatient Encounter Medications as of 06/26/2020  Medication Sig  . albuterol (VENTOLIN HFA) 108 (90 Base) MCG/ACT inhaler INHALE 2 PUFFS INTO THE LUNGS  EVERY 4 HOURS AS NEEDED FOR SHORTNESS OF BREATH  . clobetasol cream (TEMOVATE) 0.05 % Apply topically 2 (two) times daily.  Marland Kitchen conjugated estrogens (PREMARIN) vaginal cream INSERT 1 APPLICATORFUL VAGINALLY DAILY FOR 21 DAYS  . cyclobenzaprine (FLEXERIL) 5 MG tablet Take 1 tablet (5 mg total) by mouth at bedtime as needed for muscle spasms.  Marland Kitchen dexlansoprazole (DEXILANT) 60 MG capsule Take 1 capsule (60 mg total) by mouth daily.  Regino Schultze Bandages & Supports (MEDICAL COMPRESSION STOCKINGS) MISC 2 Units by Does not apply route daily.  . fluticasone (FLONASE) 50 MCG/ACT nasal spray Place 1 spray into both nostrils daily. 1 spray in each nostril every day  . furosemide (LASIX) 40 MG tablet Take 1 tablet (40 mg total) by mouth daily.  . hydrOXYzine (ATARAX/VISTARIL) 10 MG tablet TAKE 1 TABLET(10 MG) BY MOUTH DAILY AS NEEDED  . levothyroxine (SYNTHROID) 150 MCG tablet TAKE 1 TABLET(150 MCG) BY MOUTH DAILY BEFORE AND BREAKFAST  . ondansetron (ZOFRAN) 4 MG tablet Take 1 tablet (4 mg total) by mouth every 8 (eight) hours as needed.  Marland Kitchen oxyCODONE (OXY IR/ROXICODONE) 5 MG immediate release tablet Take 1 tablet (5 mg total) by mouth every 12 (twelve) hours as needed for severe pain.  . polyethylene glycol powder (GLYCOLAX/MIRALAX) 17 GM/SCOOP powder Take 17 g by mouth 2 (two) times daily as needed.  . pravastatin (PRAVACHOL) 20 MG tablet Take 1 tablet (20 mg total) by mouth daily.  . pregabalin (LYRICA) 150 MG capsule Take 1 capsule (150 mg total) by mouth 2 (two) times daily.  . QUEtiapine (SEROQUEL) 50 MG tablet TAKE 1 TABLET(50 MG) BY MOUTH AT BEDTIME  . SPIRIVA HANDIHALER 18 MCG  inhalation capsule PLACE 1 CAPSULE INTO THE INHALER AND INHALE DAILY AS NEEDED   No facility-administered encounter medications on file as of 06/26/2020.    Patient Active Problem List   Diagnosis Date Noted  . Insurance coverage problems 05/01/2020  . 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 Elbow pain, right 04/30/2020  . COVID-19  vaccine series not completed 04/01/2020  . Ear pain 01/29/2020  . Overactive bladder 01/01/2020  . At risk for polypharmacy 12/05/2019  . Vagina bleeding 11/02/2019  . Chronic pain 07/25/2019  . Menopause 03/21/2019  . Chronic diarrhea 01/08/2019  . Vaginal dryness, menopausal 10/29/2016  . Restless legs 02/23/2016  . Back pain 07/02/2015  . 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 GERD (gastroesophageal reflux disease) 06/24/2011  . Obesity (BMI 30-39.9) 01/22/2011  . Anxiety and depression 10/24/2009  . Hyperlipidemia 01/05/2008  . Lymphedema 01/04/2007  . HIDRADENITIS SUPPURATIVA 08/17/2006  . Hypothyroidism 05/05/2006  . History of tobacco use 05/05/2006  . Episodic mood disorder ([place]) 05/05/2006    Conditions to be addressed/monitored: Financial constraints related to not being able to meet dental needs  Care Plan : Clinical Social Work  Updates made by Maurine Cane, LCSW since 06/26/2020 12:00 AM  Problem: need community support   Goal: Connect patient with resources to meet dental needs   Start Date: 05/09/2020  Expected End Date: 09/04/2020  This Visit's Progress: Not on track  Recent Progress: On track  Priority: High  Current barriers:   . Patient has not received information from the dentist. ( Dr. Everlean Cherry, [place] for the [facility]    [place] [phone] . Needs $545 for dental guard ) insurance will cover most cost but she is unable to pay her portion. Marland Kitchen Acknowledges deficits with meeting these unmet need . Patient is unable to independently navigate community resource options without care coordination support Clinical Goals: patient will work with SW to address concerns related to unmet needs Clinical Interventions:  . Collaboration with Richarda Osmond, DO regarding development and update of comprehensive plan of care as evidenced by provider attestation and co-signature . Inter-disciplinary  care team collaboration (see longitudinal plan of care) . Assessment of barriers  with getting information to move forward the mouth guard. (patient has not provided needed information for LCSW to submit for patient assistance. patient contacted dental office who will contact LCSW will information   . LCSW will submit application for [facility] patient assistance program once information is received.  . If information is not received per patient's request / LCSW will f/u with Candice Hebda [phone] ext [phone]) .  Problem-solving facilitated; Resource information provided; Support and encouragement provided Patient Goals/Self-Care Activities: Over the next 30 days . Once you receive information from your dentist I will submit your request for the patient assistance program . Call to follow up with your dentist   Problem: No Advance Directive   Goal: Effective Long-Term Care Planning   Start Date: 06/26/2020  This Visit's Progress: On track  Priority: Low  Current barriers:   . Patient does not have an advance directive.  . Needs education, support and coordination in order to meet this need. Clinical Goal(s): Over the next 90 days, the patient will review information on advance directive, complete advance directive packet and have notarized.  Interventions: . Collaboration with PCP regarding development and update of comprehensive plan of care as evidenced by provider attestation and co-signature. Bertram Savin care team collaboration (see longitudinal  plan of care) . Assessed understanding of Advance Directives. . A voluntary discussion about advanced care planning including importance of advanced directives, healthcare proxy and living will was discussed with the patient.  . Educational information on Advance Directives as well as an advance directive packet provided. (copy e-mailed and left at front desk for pick up) Patient Goals/Self-Care Activities : Over the next 30 days . Review  EMMI educational information on Advance Directive  . Complete Advance Directive packet,  . Have advance directive notarized and provide a copy to provider office . Call LCSW if you have questions      Casimer Lanius, [place] / [place]   [phone] 9:51 AM

## 2020-06-26 NOTE — Patient Instructions (Addendum)
Visit Information  Goals Addressed            This Visit's Progress   . Effective Long-Term Care Planning       Patient Goals/Self-Care Activities : Over the next 30 days . Review EMMI educational information on Advance Directive  . Complete Advance Directive packet, ( copy left at front desk to pick up) . Have advance directive notarized and provide a copy to provider office . Call me if you have questions     . Find Help in My Community   Not on track    Timeframe:  Long-Range Goal Priority:  High Start Date:     05/09/20                        Expected End Date: 09/04/20                        Patient Goals/Self-Care Activities:  . Once you receive information from your dentist I will submit your request for the patient assistance program . Call to follow up with your dentist .  Why is this important?    Knowing how and where to find help for yourself or family in your neighborhood and community is an important skill.       Patient verbalizes understanding of instructions provided today and agrees to view in [place].   No follow up scheduled, I will reach out to you in 2 weeks  Casimer Lanius, [place]

## 2020-06-27 ENCOUNTER — Ambulatory Visit: Payer: Self-pay | Admitting: Licensed Clinical Social Worker

## 2020-06-27 NOTE — Patient Instructions (Signed)
Visit Information  Goals Addressed            This Visit's Progress   . Find Help in My Community   On track    Timeframe:  Long-Range Goal Priority:  High Start Date:     05/09/20                        Expected End Date: 09/04/20                        Patient Goals/Self-Care Activities:  . I have submitted the application to assist with your dental guard.  I will f/u with you once I hear back from them      The care management team will reach out to the patient again over the next 20 to 30 days days.  Casimer Lanius, LCSW Care Management & Coordination  [phone]

## 2020-06-27 NOTE — Chronic Care Management (AMB) (Signed)
  Care Management  Collaboration  Note  06/27/2020 Name: [name] MRN: [id number] DOB: 1965-12-21  [name] is a 55 y.o. year old female who is a primary care patient of Richarda Osmond, DO. The CCM team was consulted reference care coordination needs for [facility] for unmet dental needs.  Assessment: Patient was not interviewed or contacted during this encounter. CCM LCSW collaborated with patient's dental office and [facility] Patient Assistance program . See Care Plan below for interventions and patient self-care actives.   Intervention:Conducted brief assessment, recommendations and relevant information discussed.    Follow up Plan: No follow up scheduled with CCM team at this time. Will follow up with patient in 20 to 30 days or less, once update is received on mouth guard. .Patient will call office if needed prior to next encounter.   Collaboration with Richarda Osmond, DO regarding development and update of comprehensive plan of care as evidenced by provider attestation and co-signature  Review of patient past medical history, allergies, medications, and health status, including review of pertinent consultant reports was performed as part of comprehensive evaluation and provision of care management/care coordination services.   Care Plan Conditions to be addressed/monitored per PCP order: resources   Patient Care Plan: Clinical Social Work  Problem Identified: need community support   Goal: Connect patient with resources to meet dental needs   Start Date: 05/09/2020  Expected End Date: 09/04/2020  This Visit's Progress: On track  Recent Progress: Not on track  Priority: High  Note:   Current barriers:   . Patient has not received information from the dentist. ( Dr. Everlean Cherry, [place] for the [facility]    [place] [phone] . Needs $545 for dental guard ) insurance will cover most cost but she is unable to pay her  portion. Marland Kitchen Acknowledges deficits with meeting these unmet need and unable to independently navigate community resource options without care coordination support Clinical Goals: patient will work with SW to address concerns related to unmet needs Clinical Interventions:  . Collaboration with Richarda Osmond, DO regarding development and update of comprehensive plan of care as evidenced by provider attestation and co-signature . Inter-disciplinary care team collaboration (see longitudinal plan of care) . Collaboration with [employer] [phone] ext [phone]) at patient's dental office for needed information  . LCSW submitted application to [employer] patient assistance program in to cover the cost of the mouth guard.  .  Problem-solving facilitated; Resource information provided; Support and encouragement provided Patient Goals/Self-Care Activities: Over the next 30 days . I have submitted the application to assist with your dental guard.  I will f/u with you once I hear back from them      Casimer Lanius, [place] / [place]   [phone] 12:04 PM

## 2020-07-01 ENCOUNTER — Other Ambulatory Visit: Payer: Self-pay

## 2020-07-01 ENCOUNTER — Ambulatory Visit (INDEPENDENT_AMBULATORY_CARE_PROVIDER_SITE_OTHER): Payer: Medicaid Other | Admitting: Student in an Organized Health Care Education/Training Program

## 2020-07-01 ENCOUNTER — Encounter: Payer: Self-pay | Admitting: Student in an Organized Health Care Education/Training Program

## 2020-07-01 DIAGNOSIS — G8929 Other chronic pain: Secondary | ICD-10-CM

## 2020-07-01 DIAGNOSIS — F32A Depression, unspecified: Secondary | ICD-10-CM

## 2020-07-01 DIAGNOSIS — S81801D Unspecified open wound, right lower leg, subsequent encounter: Secondary | ICD-10-CM | POA: Diagnosis present

## 2020-07-01 DIAGNOSIS — F419 Anxiety disorder, unspecified: Secondary | ICD-10-CM

## 2020-07-01 NOTE — Patient Instructions (Signed)
It was a pleasure to see you today!  To summarize our discussion for this visit:  I'm sorry to hear that your leg is giving you so much trouble. Please keep it clean, elevated and use ice.   For your back pain, please use voltaren gel which you can get from your pharmacy.   Please send your medication refills through your pharmacy.   Also, please bring in your medicines with you to our next appointment.   I will reach out to Dr. Hartford Poli to try to reschedule with you.  Let me know about the dentist appointment and what type of clearance you will need for your procedure.   Call the clinic at [phone] if your symptoms worsen or you have any concerns.   Thank you for allowing me to take part in your care,  Dr. Doristine Mango

## 2020-07-01 NOTE — Progress Notes (Signed)
    SUBJECTIVE:   CHIEF COMPLAINT / HPI: f/u leg trauma  Leg wound- scraped on metal bed frame 4/14. Went to ED 4/15 Keeping it elevated. Has not used ice. Swelling has improved. Finds it difficult to ambulate 2/2 pain. Has been taking oxycodone for the pain.   Chronic back pain-  Has participated in PT once and was recommended once per week but has not adhered to this. Endorses doing recommended home exercises irregularly. Pain is unchanged. She has run out of her oxycodone. Is not asking for earlier refill but wants to know if other alternatives to get her to her refill date.   Dental problems- Went to dentist- fillings on several teeth. Got numbing and did not get extraction due to anxiety. Referred to other dentist who will give her sedation and is waiting on documentation to complete that. Help from deborah.   Depression- has cancelled counseling multiple times but still endorses benefit. Would like to reschedule with Dr. Hartford Poli  OBJECTIVE:   BP 127/80   Pulse 78   Ht 5\' 9"  (1.753 m)   Wt 250 lb (113.4 kg)   SpO2 99%   BMI 36.92 kg/m   Physical Exam Vitals and nursing note reviewed.  Constitutional:      General: She is not in acute distress.    Appearance: She is not ill-appearing or toxic-appearing.  HENT:     Head: Normocephalic.  Pulmonary:     Effort: Pulmonary effort is normal.  Musculoskeletal:     Right lower leg: Edema present.     Left lower leg: Edema present.  Skin:    General: Skin is warm and dry.     Findings: Wound present.     Comments: approximately 4x4cm superficial abrasion on anterior mid R shin. unable to palpate fully due to patient refusal but did not appear to have swelling/bruising/erythema. there was what appeared to be a pink powder on the skin surrounding the wound that I would liken to blush. Was able to wipe it off with my finger. Patient was ambulating normally.  Neurological:     Mental Status: She is alert.      ASSESSMENT/PLAN:    Leg wound, right, subsequent encounter Minor wound without signs of infection. Appears to be healing well.  Recommended non-adhesive bandages, elevation, ice and follow up as needed.  Chronic pain Recommended Voltaren gel  Continue home PT exercises and PT sessions  Anxiety and depression Follow up with Dr. Hartford Poli. - staff message sent Patient was tearful when discussing me leaving the practice in a couple months.  We scheduled an extra appointment next month and provided her with reassurance.      [place]

## 2020-07-02 ENCOUNTER — Telehealth: Payer: Self-pay | Admitting: Psychology

## 2020-07-02 DIAGNOSIS — S81801D Unspecified open wound, right lower leg, subsequent encounter: Secondary | ICD-10-CM | POA: Insufficient documentation

## 2020-07-02 DIAGNOSIS — Q820 Hereditary lymphedema: Secondary | ICD-10-CM | POA: Diagnosis not present

## 2020-07-02 NOTE — Assessment & Plan Note (Signed)
Recommended Voltaren gel  Continue home PT exercises and PT sessions

## 2020-07-02 NOTE — Telephone Encounter (Signed)
-----   Message from Richarda Osmond, DO sent at 07/02/2020  9:59 AM EDT ----- Marykay Lex! Makenzee would like for you to reach out to her to reschedule, if you don't mind. Thanks!

## 2020-07-02 NOTE — Telephone Encounter (Signed)
Scheduled BH appt with pt for 5/10 after Dr. Tonette Bihari appt.

## 2020-07-02 NOTE — Assessment & Plan Note (Signed)
Minor wound without signs of infection. Appears to be healing well.  Recommended non-adhesive bandages, elevation, ice and follow up as needed.

## 2020-07-02 NOTE — Assessment & Plan Note (Signed)
Follow up with Dr. Hartford Poli. - staff message sent Patient was tearful when discussing me leaving the practice in a couple months.  We scheduled an extra appointment next month and provided her with reassurance.

## 2020-07-04 ENCOUNTER — Other Ambulatory Visit: Payer: Self-pay

## 2020-07-04 MED ORDER — OXYCODONE HCL 5 MG PO TABS: 5.0000 mg | ORAL_TABLET | Freq: Two times a day (BID) | ORAL | 0 refills | Status: DC | PRN

## 2020-07-10 ENCOUNTER — Other Ambulatory Visit: Payer: Self-pay | Admitting: Student in an Organized Health Care Education/Training Program

## 2020-07-11 ENCOUNTER — Other Ambulatory Visit: Payer: Self-pay | Admitting: Student in an Organized Health Care Education/Training Program

## 2020-07-15 ENCOUNTER — Ambulatory Visit: Payer: Medicaid Other | Admitting: Student in an Organized Health Care Education/Training Program

## 2020-07-15 ENCOUNTER — Ambulatory Visit: Payer: Medicaid Other | Admitting: Psychology

## 2020-07-15 ENCOUNTER — Telehealth: Payer: Self-pay

## 2020-07-15 NOTE — Telephone Encounter (Signed)
Pt called to apologize for having to cancel today's appointment. She has to go to the hospital to assist her aunt that was just admitted, but has expressed she really needs to see Dr. Ouida Sills this month if possible please.

## 2020-07-22 ENCOUNTER — Ambulatory Visit: Payer: Medicaid Other | Attending: Family Medicine | Admitting: Physical Therapy

## 2020-07-22 ENCOUNTER — Telehealth: Payer: Self-pay | Admitting: Physical Therapy

## 2020-07-22 NOTE — Telephone Encounter (Signed)
Contacted patient due to missed PT appointment. Informed patient of missed appointment and she stated she has been busy taking care of a family member. Patient reminded of attendance policy and provided clinic number to call in order to schedule an appointment when she is able. Patient expressed understanding.  Hilda Blades, PT, DPT, LAT, ATC 07/22/20  1:35 PM Phone: [phone] Fax: [phone]

## 2020-07-29 ENCOUNTER — Ambulatory Visit: Payer: Medicaid Other | Admitting: Licensed Clinical Social Worker

## 2020-07-29 DIAGNOSIS — Z7189 Other specified counseling: Secondary | ICD-10-CM

## 2020-07-29 NOTE — Chronic Care Management (AMB) (Signed)
Care Management   Clinical Social Work Note  07/29/2020 Name: EZRAH DEMBECK MRN: [id number] DOB: 1965-12-05  PENINA REISNER is a 55 y.o. year old female who is a primary care patient of Richarda Osmond, DO. The CCM team was consulted to assist the patient with chronic disease management and/or care coordination needs related to: [employer]  to meet dental needs.   Engaged with patient by telephone for follow up visit in response to provider referral for social work chronic care management and care coordination services.   Consent to Services:  The patient was given information about Chronic Care Management services, agreed to services, and gave verbal consent prior to initiation of services.  Please see initial visit note for detailed documentation.   Patient agreed to services and consent obtained.   Assessment: Patient is making progress with care plan goal.  informed patient she has been approved to receive assistance with her dental guard.  See Care Plan below for interventions and patient self-care actives.  Follow up Plan: No follow up scheduled with CCM team at this time. Patient will contact office as needed.    LCSW will continue to collaborate with Dentist until process is complete.    : Review of patient past medical history, allergies, medications, and health status, including review of relevant consultants reports was performed today as part of a comprehensive evaluation and provision of chronic care management and care coordination services.     SDOH (Social Determinants of Health) assessments and interventions performed:    Advanced Directives Status: Not addressed in this encounter.  CCM Care Plan  Allergies  Allergen Reactions  . Vicodin [Hydrocodone-Acetaminophen] Itching    Outpatient Encounter Medications as of 07/29/2020  Medication Sig  . albuterol (VENTOLIN HFA) 108 (90 Base) MCG/ACT inhaler INHALE 2 PUFFS INTO THE LUNGS EVERY 4 HOURS AS NEEDED  FOR SHORTNESS OF BREATH  . clobetasol cream (TEMOVATE) 0.05 % Apply topically 2 (two) times daily.  Marland Kitchen conjugated estrogens (PREMARIN) vaginal cream INSERT 1 APPLICATORFUL VAGINALLY DAILY FOR 21 DAYS  . cyclobenzaprine (FLEXERIL) 5 MG tablet TAKE 1 TABLET(5 MG) BY MOUTH AT BEDTIME AS NEEDED FOR MUSCLE SPASMS  . dexlansoprazole (DEXILANT) 60 MG capsule Take 1 capsule (60 mg total) by mouth daily.  Regino Schultze Bandages & Supports (MEDICAL COMPRESSION STOCKINGS) MISC 2 Units by Does not apply route daily.  . fluticasone (FLONASE) 50 MCG/ACT nasal spray Place 1 spray into both nostrils daily. 1 spray in each nostril every day  . furosemide (LASIX) 40 MG tablet Take 1 tablet (40 mg total) by mouth daily.  . hydrOXYzine (ATARAX/VISTARIL) 10 MG tablet TAKE 1 TABLET(10 MG) BY MOUTH DAILY AS NEEDED  . levothyroxine (SYNTHROID) 150 MCG tablet TAKE 1 TABLET(150 MCG) BY MOUTH DAILY BEFORE AND BREAKFAST  . ondansetron (ZOFRAN) 4 MG tablet Take 1 tablet (4 mg total) by mouth every 8 (eight) hours as needed.  Marland Kitchen oxyCODONE (OXY IR/ROXICODONE) 5 MG immediate release tablet Take 1 tablet (5 mg total) by mouth every 12 (twelve) hours as needed for severe pain.  . polyethylene glycol powder (GLYCOLAX/MIRALAX) 17 GM/SCOOP powder Take 17 g by mouth 2 (two) times daily as needed.  . pravastatin (PRAVACHOL) 20 MG tablet Take 1 tablet (20 mg total) by mouth daily.  . pregabalin (LYRICA) 150 MG capsule Take 1 capsule (150 mg total) by mouth 2 (two) times daily.  . QUEtiapine (SEROQUEL) 50 MG tablet TAKE 1 TABLET(50 MG) BY MOUTH AT BEDTIME  . SPIRIVA HANDIHALER  18 MCG inhalation capsule PLACE 1 CAPSULE INTO THE INHALER AND INHALE DAILY AS NEEDED   No facility-administered encounter medications on file as of 07/29/2020.    Patient Active Problem List   Diagnosis Date Noted  . Leg wound, right, subsequent encounter 07/02/2020  . Insurance coverage problems 05/01/2020  . 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 Elbow pain, right  04/30/2020  . COVID-19 vaccine series not completed 04/01/2020  . Ear pain 01/29/2020  . Overactive bladder 01/01/2020  . At risk for polypharmacy 12/05/2019  . Vagina bleeding 11/02/2019  . Chronic pain 07/25/2019  . Menopause 03/21/2019  . Chronic diarrhea 01/08/2019  . Vaginal dryness, menopausal 10/29/2016  . Restless legs 02/23/2016  . Back pain 07/02/2015  . 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 GERD (gastroesophageal reflux disease) 06/24/2011  . Obesity (BMI 30-39.9) 01/22/2011  . Anxiety and depression 10/24/2009  . Hyperlipidemia 01/05/2008  . Lymphedema 01/04/2007  . HIDRADENITIS SUPPURATIVA 08/17/2006  . Hypothyroidism 05/05/2006  . History of tobacco use 05/05/2006  . Episodic mood disorder ([place]) 05/05/2006    Conditions to be addressed/monitored:   Care Plan : Clinical Social Work  Updates made by Maurine Cane, LCSW since 07/29/2020 12:00 AM  Problem: need community support   Goal: Connect patient with resources to meet dental needs   Start Date: 05/09/2020  Expected End Date: 09/04/2020  This Visit's Progress: On track  Recent Progress: On track  Priority: High  Current barriers:   . Patient has not received information from the dentist. ( Dr. Everlean Cherry, [place] for the [place]    [place] [phone] . Needs $545 for dental guard ) insurance will cover most cost but she is unable to pay her portion. Marland Kitchen Acknowledges deficits with meeting these unmet need and unable to independently navigate community resource options without care coordination support Clinical Goals: patient will work with SW to address concerns related to unmet needs Clinical Interventions:  . Inter-disciplinary care team collaboration (see longitudinal plan of care) . Funds for patient's mouth guard have been approved by [employer] Patient Assistance; provided updated to patient and Candice at dental office . Share with Candice  via e-mail contact person . Collaboration with [employer] [phone]) at patient's dental office for needed information  . LCSW submitted application to [facility] patient assistance program in to cover the cost of the mouth guard.  . Problem-solving facilitated; Resource information provided; Support and encouragement provided Patient Goals/Self-Care Activities: Over the next 30 days . You have been approved to receive assistance with purchasing your mouth guard . I have contacted Candice to provider her with an update     Casimer Lanius, [place] / [place]   [phone] 4:32 PM

## 2020-07-29 NOTE — Patient Instructions (Signed)
Visit Information  Goals Addressed            This Visit's Progress   . Find Help in My Community   On track    Timeframe:  Long-Range Goal Priority:  High Start Date:     05/09/20                        Expected End Date: 09/04/20                        Patient Goals/Self-Care Activities:  . You have been approved to receive assistance with purchasing your mouth guard . I have contacted Candice to provider her with an update      Patient verbalizes understanding of instructions provided today and agrees to view in [place].   No follow up scheduled at this time  Casimer Lanius, [place]

## 2020-08-01 ENCOUNTER — Telehealth: Payer: Self-pay | Admitting: Student in an Organized Health Care Education/Training Program

## 2020-08-01 DIAGNOSIS — Q820 Hereditary lymphedema: Secondary | ICD-10-CM | POA: Diagnosis not present

## 2020-08-01 NOTE — Telephone Encounter (Signed)
Patient is calling and would like to have her oxycodone refilled.

## 2020-08-04 ENCOUNTER — Encounter: Payer: Self-pay | Admitting: Student in an Organized Health Care Education/Training Program

## 2020-08-05 NOTE — Telephone Encounter (Signed)
Patient is calling again to check on the status of having her oxycodone refilled. I have reminded her of message below. Due to it being oxycodone they need the refill request placed by pcp office.   Please advise

## 2020-08-05 NOTE — Telephone Encounter (Signed)
Per our policy pts are supposed to call their pcp for controlled substances. I checked with the preceptor and the nurses. Please advise. [name] Kennon Holter, CMA

## 2020-08-06 ENCOUNTER — Other Ambulatory Visit: Payer: Self-pay | Admitting: Student in an Organized Health Care Education/Training Program

## 2020-08-06 MED ORDER — OXYCODONE HCL 5 MG PO TABS: 5.0000 mg | ORAL_TABLET | Freq: Two times a day (BID) | ORAL | 0 refills | Status: DC | PRN

## 2020-08-07 ENCOUNTER — Ambulatory Visit: Payer: Medicaid Other | Admitting: Student in an Organized Health Care Education/Training Program

## 2020-08-07 ENCOUNTER — Ambulatory Visit: Payer: Self-pay | Admitting: Licensed Clinical Social Worker

## 2020-08-07 DIAGNOSIS — Z7189 Other specified counseling: Secondary | ICD-10-CM

## 2020-08-07 NOTE — Chronic Care Management (AMB) (Signed)
  Care Management  Collaboration  Note  08/07/2020 Name: YASSMIN BINEGAR MRN: [id number] DOB: 09-14-1965  KRISTY SCHOMBURG is a 55 y.o. year old female who is a primary care patient of Richarda Osmond, DO. The CCM team was consulted reference care coordination needs for [employer] .  F/U phone call today to assess needs, and progress with care plan goals.   Telephone outreach was unsuccessful A HIPPA compliant phone message was left for the patient providing contact information and requesting a return call.   Assessment: Patient was not interviewed or contacted during this encounter. CCM LCSW collaborated with Dental office .  See Care Plan below for interventions and patient self-care actives.   Follow up Plan: LCSW will wait for return call from patient and or dentist office.  If no return call is received will f/u in 5 to7 days.   Review of patient past medical history, allergies, medications, and health status, including review of pertinent consultant reports was performed as part of comprehensive evaluation and provision of care management/care coordination services.   Care Plan Conditions to be addressed/monitored per PCP order:    Patient Care Plan: Clinical Social Work  Problem Identified: need community support   Goal: Connect patient with resources to meet dental needs   Start Date: 05/09/2020  Expected End Date: 09/04/2020  Recent Progress: On track  Priority: High  Current barriers:   . Patient has not received information from the dentist. ( Dr. Everlean Cherry, [place] for the [place]    [place] [phone] . Needs $545 for dental guard ) insurance will cover most cost but she is unable to pay her portion. Marland Kitchen Acknowledges deficits with meeting these unmet need and unable to independently navigate community resource options without care coordination support Clinical Goals: patient will work with SW to address concerns related to unmet needs Clinical  Interventions:  . Inter-disciplinary care team collaboration (see longitudinal plan of care) . Funds for patient's mouth guard have been approved by [employer] Patient Assistance; provided updated to patient and Candice at dental office . Share with Candice via e-mail contact person; sent 2 e-mail no response  . Called office twice and left two voice messages;  . Collaboration with [employer] [phone] ext [phone]) at patient's dental office for needed information  . LCSW submitted application to [facility] patient assistance program in to cover the cost of the mouth guard.  . Problem-solving facilitated; Resource information provided; Support and encouragement provided Patient Goals/Self-Care Activities: Over the next 30 days . You have been approved to receive assistance with purchasing your mouth guard . I have contacted Candice to provider her with an update     Casimer Lanius, [place] / [place]   [phone] 2:10 PM

## 2020-08-07 NOTE — Patient Instructions (Signed)
Visit Information  I have not been able to reach your dentis.  Please contact me if you have already received your mouth guard.    Casimer Lanius, LCSW Care Management & Coordination   [phone]

## 2020-08-08 ENCOUNTER — Telehealth: Payer: Self-pay

## 2020-08-08 ENCOUNTER — Other Ambulatory Visit: Payer: Self-pay | Admitting: Student in an Organized Health Care Education/Training Program

## 2020-08-08 NOTE — Telephone Encounter (Signed)
Patient calls nurse line regarding status of oxycodone PA. Informed patient that clinical questions had been submitted and that we are waiting on determination from insurance company.  Patient is requesting a non controlled pain medication to be sent into pharmacy, to get her through until oxycodone is approved.   Please advise.   Talbot Grumbling, RN

## 2020-08-08 NOTE — Telephone Encounter (Signed)
Patient calls nurse line regarding PA on Oxycodone 5 mg tablets. Contacted pharmacist, PA required.   Clinical questions submitted via Covermymeds. Response may take up to 72 hours.   Key:  [id number]

## 2020-08-11 ENCOUNTER — Encounter: Payer: Self-pay | Admitting: Student in an Organized Health Care Education/Training Program

## 2020-08-11 NOTE — Telephone Encounter (Signed)
Replied to patient and recommended tylenol

## 2020-08-12 ENCOUNTER — Ambulatory Visit: Payer: Medicaid Other | Admitting: Licensed Clinical Social Worker

## 2020-08-12 ENCOUNTER — Encounter: Payer: Self-pay | Admitting: Student in an Organized Health Care Education/Training Program

## 2020-08-12 ENCOUNTER — Other Ambulatory Visit: Payer: Self-pay

## 2020-08-12 ENCOUNTER — Ambulatory Visit (INDEPENDENT_AMBULATORY_CARE_PROVIDER_SITE_OTHER): Payer: Medicaid Other | Admitting: Student in an Organized Health Care Education/Training Program

## 2020-08-12 VITALS — BP 118/80 | HR 67 | Wt 256.8 lb

## 2020-08-12 DIAGNOSIS — Z7189 Other specified counseling: Secondary | ICD-10-CM

## 2020-08-12 DIAGNOSIS — F419 Anxiety disorder, unspecified: Secondary | ICD-10-CM

## 2020-08-12 DIAGNOSIS — F32A Depression, unspecified: Secondary | ICD-10-CM

## 2020-08-12 DIAGNOSIS — J302 Other seasonal allergic rhinitis: Secondary | ICD-10-CM | POA: Diagnosis not present

## 2020-08-12 DIAGNOSIS — S81801D Unspecified open wound, right lower leg, subsequent encounter: Secondary | ICD-10-CM

## 2020-08-12 DIAGNOSIS — M545 Low back pain, unspecified: Secondary | ICD-10-CM | POA: Diagnosis not present

## 2020-08-12 DIAGNOSIS — E890 Postprocedural hypothyroidism: Secondary | ICD-10-CM

## 2020-08-12 DIAGNOSIS — E669 Obesity, unspecified: Secondary | ICD-10-CM | POA: Diagnosis not present

## 2020-08-12 MED ORDER — FEXOFENADINE-PSEUDOEPHED ER 60-120 MG PO TB12: 1.0000 | ORAL_TABLET | Freq: Two times a day (BID) | ORAL | 1 refills | Status: DC

## 2020-08-12 MED ORDER — KETOTIFEN FUMARATE 0.025 % OP SOLN: 1.0000 [drp] | Freq: Two times a day (BID) | OPHTHALMIC | 0 refills | Status: DC

## 2020-08-12 NOTE — Progress Notes (Signed)
SUBJECTIVE:   CHIEF COMPLAINT / HPI: f/u  Seasonal allergies- primary bothersome symptoms is watery/itchy/red eyes.-Greatly aggravated over the past couple of months.  Last night she tried one of her friends Singulair tablets and that seemed to have a great improvement in her symptoms.  She also takes Flonase daily.  Mouth guard-patient endorses continued teeth grinding at night and has not followed up with her dentist.  She is going to contact a new dentist as she was not happy with the care she received at the initial visit.  Hypothyroidism-patient endorses good adherence with thyroid medication and denies hyper/hypo symptoms  Lymphedema-patient endorses her swelling in is doing really well currently in the injury she has sustained prior to our last visit is healing really well.  Chronic back pain-there was insurance and pharmacy difficulties with obtaining her chronic oxycodone but patient has gotten the refill in the past couple of days and feels much better.  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  Her aunt is about to go to SNF so she will try to reconnect with her counselor.  Currently denies SI.  OBJECTIVE:   BP 118/80   Pulse 67   Wt 256 lb 12.8 oz (116.5 kg)   SpO2 96%   BMI 37.92 kg/m   Physical Exam Vitals and nursing note reviewed.  Constitutional:      General: She is not in acute distress.    Appearance: She is not ill-appearing or toxic-appearing.  HENT:     Right Ear: Tympanic membrane and external ear normal.     Left Ear: Tympanic membrane and external ear normal.     Nose: Nose normal.     Mouth/Throat:     Mouth: Mucous membranes are moist.     Pharynx: Oropharynx is clear. No oropharyngeal exudate.  Eyes:     General:        Right eye: No discharge.        Left eye: No discharge.     Conjunctiva/sclera: Conjunctivae normal.     Pupils: Pupils are equal, round, and  reactive to light.  Neck:     Comments: Surgical scar Cardiovascular:     Rate and Rhythm: Normal rate and regular rhythm.     Pulses: Normal pulses.     Heart sounds: Normal heart sounds.  Pulmonary:     Effort: Pulmonary effort is normal. No respiratory distress.     Breath sounds: Normal breath sounds.  Musculoskeletal:     Cervical back: Neck supple. No tenderness.     Right lower leg: Edema present.     Left lower leg: Edema present.  Lymphadenopathy:     Cervical: No cervical adenopathy.  Skin:    General: Skin is warm and dry.     Capillary Refill: Capillary refill takes less than 2 seconds.     Comments: New scar tissue on right shin from recent injury appears to be healing well without signs of infection or tenderness to palpation.  Neurological:     General: No focal deficit present.     Mental Status: She is alert and oriented to person, place, and time.  Psychiatric:        Mood and Affect: Mood normal.        Behavior: Behavior normal.    Depression screen [facility] 2/9 08/12/2020 07/01/2020 05/30/2020  Decreased Interest 2 2 1   Down, Depressed, Hopeless 3 2 3  PHQ - 2 Score 5 4 4   Altered sleeping 3 2 3   Tired, decreased energy 3 2 2   Change in appetite 3 - 1  Feeling bad or failure about yourself  0 0 0  Trouble concentrating 3 1 2   Moving slowly or fidgety/restless 0 3 0  Suicidal thoughts 0 0 0  PHQ-9 Score 17 12 12   Some recent data might be hidden    ASSESSMENT/PLAN:   Seasonal allergies Continue Flonase.  Since symptoms are primarily related to itchy eyes, recommended antihistamine eyedrops which were prescribed today.  Also recommended daily antihistamine such as Allegra.  Follow-up at next visit to see if there was improvement with this treatment.  Hypothyroidism Recheck TSH today.  Leg wound, right, subsequent encounter Improved.  Anxiety and depression Follow-up with Dr. Hartford Poli  Back pain Well-controlled with current oxycodone  prescription. Continue home physical therapy exercises     [place]

## 2020-08-12 NOTE — Chronic Care Management (AMB) (Signed)
Care Management   Clinical Social Work Note  08/12/2020 Name: [name] MRN: [id number] DOB: 03-31-1965  [name] is a 55 y.o. year old female who is a primary care patient of Richarda Osmond, DO. The CCM team was consulted to assist the patient with chronic disease management and/or care coordination needs related to: [employer]  for dental needs.   Engaged with patient by telephone for follow up visit in response to provider referral for social work chronic care management and care coordination services.   Consent to Services:  The patient was given information about Chronic Care Management services, agreed to services, and gave verbal consent prior to initiation of services.  Please see initial visit note for detailed documentation.  Patient agreed to services and consent obtained.   Assessment: Provided patient with an update of next steps on what she needs to do ref. The mouth guard. See Care Plan below for interventions and patient self-care actives.  Recommendation: Patient will call dental office to schedule appointment then follow up with LCSW to coordinate payment.  Follow up Plan: No follow up scheduled with CCM team at this time. Will follow up with patient in 1 to 2 weeks if no return call. .    Review of patient past medical history, allergies, medications, and health status, including review of relevant consultants reports was performed today as part of a comprehensive evaluation and provision of chronic care management and care coordination services.     SDOH (Social Determinants of Health) assessments and interventions performed:    Advanced Directives Status: Not addressed in this encounter.  CCM Care Plan  Allergies  Allergen Reactions  . Vicodin [Hydrocodone-Acetaminophen] Itching    Outpatient Encounter Medications as of 08/12/2020  Medication Sig  . albuterol (VENTOLIN HFA) 108 (90 Base) MCG/ACT inhaler INHALE 2 PUFFS INTO THE LUNGS EVERY 4  HOURS AS NEEDED FOR SHORTNESS OF BREATH  . clobetasol cream (TEMOVATE) 0.05 % Apply topically 2 (two) times daily.  Marland Kitchen conjugated estrogens (PREMARIN) vaginal cream INSERT 1 APPLICATORFUL VAGINALLY DAILY FOR 21 DAYS  . cyclobenzaprine (FLEXERIL) 5 MG tablet TAKE 1 TABLET(5 MG) BY MOUTH AT BEDTIME AS NEEDED FOR MUSCLE SPASMS  . dexlansoprazole (DEXILANT) 60 MG capsule Take 1 capsule (60 mg total) by mouth daily.  Regino Schultze Bandages & Supports (MEDICAL COMPRESSION STOCKINGS) MISC 2 Units by Does not apply route daily.  . fexofenadine-pseudoephedrine (ALLEGRA-D ALLERGY & CONGESTION) 60-120 MG 12 hr tablet Take 1 tablet by mouth 2 (two) times daily.  . fluticasone (FLONASE) 50 MCG/ACT nasal spray Place 1 spray into both nostrils daily. 1 spray in each nostril every day  . furosemide (LASIX) 40 MG tablet Take 1 tablet (40 mg total) by mouth daily.  . hydrOXYzine (ATARAX/VISTARIL) 10 MG tablet TAKE 1 TABLET(10 MG) BY MOUTH DAILY AS NEEDED  . ketotifen (ZADITOR) 0.025 % ophthalmic solution Place 1 drop into both eyes 2 (two) times daily.  Marland Kitchen levothyroxine (SYNTHROID) 150 MCG tablet TAKE 1 TABLET(150 MCG) BY MOUTH DAILY BEFORE AND BREAKFAST  . ondansetron (ZOFRAN) 4 MG tablet TAKE 1 TABLET(4 MG) BY MOUTH EVERY 8 HOURS AS NEEDED  . oxyCODONE (OXY IR/ROXICODONE) 5 MG immediate release tablet Take 1 tablet (5 mg total) by mouth every 12 (twelve) hours as needed for severe pain.  . polyethylene glycol powder (GLYCOLAX/MIRALAX) 17 GM/SCOOP powder Take 17 g by mouth 2 (two) times daily as needed.  . pravastatin (PRAVACHOL) 20 MG tablet Take 1 tablet (20 mg total) by  mouth daily.  . pregabalin (LYRICA) 150 MG capsule Take 1 capsule (150 mg total) by mouth 2 (two) times daily.  . QUEtiapine (SEROQUEL) 50 MG tablet TAKE 1 TABLET(50 MG) BY MOUTH AT BEDTIME  . SPIRIVA HANDIHALER 18 MCG inhalation capsule PLACE 1 CAPSULE INTO THE INHALER AND INHALE DAILY AS NEEDED   No facility-administered encounter medications on  file as of 08/12/2020.    Patient Active Problem List   Diagnosis Date Noted  . Leg wound, right, subsequent encounter 07/02/2020  . Insurance coverage problems 05/01/2020  . 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 Elbow pain, right 04/30/2020  . COVID-19 vaccine series not completed 04/01/2020  . Ear pain 01/29/2020  . Overactive bladder 01/01/2020  . At risk for polypharmacy 12/05/2019  . Vagina bleeding 11/02/2019  . Chronic pain 07/25/2019  . Menopause 03/21/2019  . Chronic diarrhea 01/08/2019  . Vaginal dryness, menopausal 10/29/2016  . Restless legs 02/23/2016  . Back pain 07/02/2015  . 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 GERD (gastroesophageal reflux disease) 06/24/2011  . Obesity (BMI 30-39.9) 01/22/2011  . Anxiety and depression 10/24/2009  . Hyperlipidemia 01/05/2008  . Lymphedema 01/04/2007  . HIDRADENITIS SUPPURATIVA 08/17/2006  . Hypothyroidism 05/05/2006  . History of tobacco use 05/05/2006  . Episodic mood disorder ([place]) 05/05/2006    Conditions to be addressed/monitored: dental needs  Care Plan : Clinical Social Work  Updates made by Maurine Cane, LCSW since 08/12/2020 12:00 AM  Problem: need community support   Goal: Connect patient with resources to meet dental needs   Start Date: 05/09/2020  Expected End Date: 09/04/2020  This Visit's Progress: On track  Recent Progress: On track  Priority: High  Current barriers:   . Patient . (Needs $545 for dental guard ) insurance will cover most cost but she is unable to pay her portion. .  Dr. Everlean Cherry, [place] for the [place]    [place] [phone] . Acknowledges deficits with meeting these unmet need and unable to independently navigate community resource options without care coordination support Clinical Goals: patient will work with SW to address concerns related to unmet needs Clinical Interventions:  . Inter-disciplinary care team collaboration  (see longitudinal plan of care) . F/u and collaboration with [facility] Patient Assistance  . Funds for patient's mouth guard have been approved by [employer] Patient Assistance; provided updated to patient and Candice at dental office . Share with Candice via e-mail contact person; received response continue to coordinate payment . Collaborated with Destiny at dental office on payment process ; patient's appointment to make the impressions is June 14th at 9:00 . Collaboration with [employer] [phone] ext [phone]) at patient's dental office for needed information  . LCSW submitted application to [facility] patient assistance program in to cover the cost of the mouth guard.  . Problem-solving facilitated; Resource information provided; Support and encouragement provided Patient Goals/Self-Care Activities: Over the next 30 days . You have been approved to receive assistance with purchasing your mouth guard . Keep appointment June 14th for your impressions / mouth guard . Payment will be made prior to this appointment      Casimer Lanius, [place] / [place]   [phone] 2:17 PM

## 2020-08-12 NOTE — Telephone Encounter (Signed)
Please see below determination for oxycodone.      Called pharmacy with approval. Patient had already picked up medication over the weekend.   Talbot Grumbling, RN

## 2020-08-12 NOTE — Patient Instructions (Signed)
It was a pleasure to see you today!  To summarize our discussion for this visit:  I'm glad to hear that your aunt is doing better.   Please make sure you are also taking care of yourself:  Setting up follow up with dentist  Treating allergies with new eye drops and allergy medication today  Checking your blood work today  Protecting your bed frame to prevent further injuries  Some additional health maintenance measures we should update are: Health Maintenance Due  Topic Date Due  . Pneumococcal Vaccine 16-57 Years old (1 of 2 - PPSV23) Never done  . Zoster Vaccines- Shingrix (1 of 2) Never done  .    Please return to our clinic to see me soon.  Call the clinic at [phone] if your symptoms worsen or you have any concerns.   Thank you for allowing me to take part in your care,  Dr. Doristine Mango

## 2020-08-12 NOTE — Patient Instructions (Addendum)
Visit Information  Goals Addressed            This Visit's Progress   . Find Help in My Community       Timeframe:  Long-Range Goal Priority:  High Start Date:     05/09/20                        Expected End Date: 09/04/20                        Patient Goals/Self-Care Activities:  . You have been approved to receive assistance with purchasing your mouth guard . Keep appointment June 14th for your impressions / mouth guard . Payment will be made prior to this appointment       Patient verbalizes understanding of instructions provided today and agrees to view in [place].   Will f/u to make sure payment is made prior to patients dental appointment.  Casimer Lanius, LCSW Care Management & Coordination  [phone]

## 2020-08-13 DIAGNOSIS — J302 Other seasonal allergic rhinitis: Secondary | ICD-10-CM | POA: Insufficient documentation

## 2020-08-13 LAB — COMPREHENSIVE METABOLIC PANEL
ALT: 24 IU/L (ref 0–32)
AST: 24 IU/L (ref 0–40)
Albumin/Globulin Ratio: 1.6 (ref 1.2–2.2)
Albumin: 3.8 g/dL (ref 3.8–4.9)
Alkaline Phosphatase: 112 IU/L (ref 44–121)
BUN/Creatinine Ratio: 11 (ref 9–23)
BUN: 11 mg/dL (ref 6–24)
Bilirubin Total: 0.5 mg/dL (ref 0.0–1.2)
CO2: 25 mmol/L (ref 20–29)
Calcium: 8.5 mg/dL — ABNORMAL LOW (ref 8.7–10.2)
Chloride: 100 mmol/L (ref 96–106)
Creatinine, Ser: 0.99 mg/dL (ref 0.57–1.00)
Globulin, Total: 2.4 g/dL (ref 1.5–4.5)
Glucose: 93 mg/dL (ref 65–99)
Potassium: 3.9 mmol/L (ref 3.5–5.2)
Sodium: 138 mmol/L (ref 134–144)
Total Protein: 6.2 g/dL (ref 6.0–8.5)
eGFR: 67 mL/min/{1.73_m2} (ref >59)

## 2020-08-13 LAB — LIPID PANEL
Chol/HDL Ratio: 3 ratio (ref 0.0–4.4)
Cholesterol, Total: 213 mg/dL — ABNORMAL HIGH (ref 100–199)
HDL: 70 mg/dL (ref >39)
LDL Chol Calc (NIH): 127 mg/dL — ABNORMAL HIGH (ref 0–99)
Triglycerides: 88 mg/dL (ref 0–149)
VLDL Cholesterol Cal: 16 mg/dL (ref 5–40)

## 2020-08-13 LAB — CBC
Hematocrit: 39 % (ref 34.0–46.6)
Hemoglobin: 13.2 g/dL (ref 11.1–15.9)
MCH: 32.1 pg (ref 26.6–33.0)
MCHC: 33.8 g/dL (ref 31.5–35.7)
MCV: 95 fL (ref 79–97)
Platelets: 246 10*3/uL (ref 150–450)
RBC: 4.11 x10E6/uL (ref 3.77–5.28)
RDW: 12.2 % (ref 11.7–15.4)
WBC: 4 10*3/uL (ref 3.4–10.8)

## 2020-08-13 LAB — TSH: TSH: 0.918 u[IU]/mL (ref 0.450–4.500)

## 2020-08-13 NOTE — Assessment & Plan Note (Signed)
Improved

## 2020-08-13 NOTE — Assessment & Plan Note (Signed)
Follow-up with Dr. Hartford Poli

## 2020-08-13 NOTE — Assessment & Plan Note (Signed)
Continue Flonase.  Since symptoms are primarily related to itchy eyes, recommended antihistamine eyedrops which were prescribed today.  Also recommended daily antihistamine such as Allegra.  Follow-up at next visit to see if there was improvement with this treatment.

## 2020-08-13 NOTE — Assessment & Plan Note (Signed)
Recheck TSH today.  

## 2020-08-13 NOTE — Assessment & Plan Note (Signed)
>>  ASSESSMENT AND PLAN FOR LOW BACK PAIN WRITTEN ON 08/13/2020 11:54 AM BY ANDERSON, CHELSEY L, DO  Well-controlled with current oxycodone prescription. Continue home physical therapy exercises

## 2020-08-13 NOTE — Assessment & Plan Note (Signed)
Well-controlled with current oxycodone prescription. Continue home physical therapy exercises

## 2020-08-22 ENCOUNTER — Ambulatory Visit: Payer: Medicaid Other | Admitting: Licensed Clinical Social Worker

## 2020-08-22 DIAGNOSIS — Z7689 Persons encountering health services in other specified circumstances: Secondary | ICD-10-CM

## 2020-08-22 DIAGNOSIS — Z7189 Other specified counseling: Secondary | ICD-10-CM

## 2020-08-22 NOTE — Patient Instructions (Signed)
Visit Information   Goals Addressed             This Visit's Progress    COMPLETED: Find Help in My Community       Timeframe:  Long-Range Goal Priority:  High Start Date:     05/09/20                        Expected End Date: 09/04/20                        Patient Goals/Self-Care Activities:  Pick up mouth guard when it is ready Per our conversation I will transfer you to the Managed Medicaid care manager for your on going needs      Patient verbalizes understanding of instructions provided today and agrees to view in [place].   No further follow up required: by Neoma Laming at this time.  CCM Managed Medicad team will contact you in about 30 days  Casimer Lanius, [place] Management & Coordination  [phone]

## 2020-08-22 NOTE — Chronic Care Management (AMB) (Addendum)
Care Management   Clinical Social Work Note  08/22/2020 Name: [name] MRN: [id number] DOB: 01/23/1966  [name] is a 55 y.o. year old female who is a primary care patient of Richarda Osmond, DO. The CCM team was consulted to assist the patient with chronic disease management and/or care coordination needs related to: dental needs.  Engaged with patient by telephone for follow up visit in response to provider referral for social work chronic care management and care coordination services.   Consent to Services: Patient agreed to services and consent obtained.   Assessment: Patient is engaged in conversation, continues to maintain positive progress with care plan goals.. See Care Plan below for interventions and patient self-care actives. Recent life changes /stressors: continues to need resources and support.  Recommendation: Patient may benefit from, and is in agreement to connect with Managed Medicaid team for ongoing support and resources.   Follow up Plan: Patient would like continued follow-up.  CCM LCSW will disconnect from care team and transfer patient to the CCM MM team to follow up with patient 30 day. Patient will call office if needed prior to next encounter.    Review of patient past medical history, allergies, medications, and health status, including review of relevant consultants reports was performed today as part of a comprehensive evaluation and provision of chronic care management and care coordination services.     SDOH (Social Determinants of Health) assessments and interventions performed:    Advanced Directives Status: Not addressed in this encounter.  CCM Care Plan  Allergies  Allergen Reactions   Vicodin [Hydrocodone-Acetaminophen] Itching    Outpatient Encounter Medications as of 08/22/2020  Medication Sig   albuterol (VENTOLIN HFA) 108 (90 Base) MCG/ACT inhaler INHALE 2 PUFFS INTO THE LUNGS EVERY 4 HOURS AS NEEDED FOR SHORTNESS OF BREATH    clobetasol cream (TEMOVATE) 0.05 % Apply topically 2 (two) times daily.   conjugated estrogens (PREMARIN) vaginal cream INSERT 1 APPLICATORFUL VAGINALLY DAILY FOR 21 DAYS   cyclobenzaprine (FLEXERIL) 5 MG tablet TAKE 1 TABLET(5 MG) BY MOUTH AT BEDTIME AS NEEDED FOR MUSCLE SPASMS   dexlansoprazole (DEXILANT) 60 MG capsule Take 1 capsule (60 mg total) by mouth daily.   Elastic Bandages & Supports (MEDICAL COMPRESSION STOCKINGS) MISC 2 Units by Does not apply route daily.   fexofenadine-pseudoephedrine (ALLEGRA-D ALLERGY & CONGESTION) 60-120 MG 12 hr tablet Take 1 tablet by mouth 2 (two) times daily.   fluticasone (FLONASE) 50 MCG/ACT nasal spray Place 1 spray into both nostrils daily. 1 spray in each nostril every day   furosemide (LASIX) 40 MG tablet Take 1 tablet (40 mg total) by mouth daily.   hydrOXYzine (ATARAX/VISTARIL) 10 MG tablet TAKE 1 TABLET(10 MG) BY MOUTH DAILY AS NEEDED   ketotifen (ZADITOR) 0.025 % ophthalmic solution Place 1 drop into both eyes 2 (two) times daily.   levothyroxine (SYNTHROID) 150 MCG tablet TAKE 1 TABLET(150 MCG) BY MOUTH DAILY BEFORE AND BREAKFAST   ondansetron (ZOFRAN) 4 MG tablet TAKE 1 TABLET(4 MG) BY MOUTH EVERY 8 HOURS AS NEEDED   oxyCODONE (OXY IR/ROXICODONE) 5 MG immediate release tablet Take 1 tablet (5 mg total) by mouth every 12 (twelve) hours as needed for severe pain.   polyethylene glycol powder (GLYCOLAX/MIRALAX) 17 GM/SCOOP powder Take 17 g by mouth 2 (two) times daily as needed.   pravastatin (PRAVACHOL) 20 MG tablet Take 1 tablet (20 mg total) by mouth daily.   pregabalin (LYRICA) 150 MG capsule Take 1 capsule (150 mg  total) by mouth 2 (two) times daily.   QUEtiapine (SEROQUEL) 50 MG tablet TAKE 1 TABLET(50 MG) BY MOUTH AT BEDTIME   SPIRIVA HANDIHALER 18 MCG inhalation capsule PLACE 1 CAPSULE INTO THE INHALER AND INHALE DAILY AS NEEDED   No facility-administered encounter medications on file as of 08/22/2020.    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   Elbow pain, right 04/30/2020   COVID-19 vaccine series not completed 04/01/2020   Ear pain 01/29/2020   Overactive bladder 01/01/2020   At risk for polypharmacy 12/05/2019   Vagina bleeding 11/02/2019   Chronic pain 07/25/2019   Menopause 03/21/2019   Chronic diarrhea 01/08/2019   Vaginal dryness, menopausal 10/29/2016   Restless legs 02/23/2016   Back pain 07/02/2015   Spondylosis of lumbar region without myelopathy or radiculopathy 12/13/2014   GERD (gastroesophageal reflux disease) 06/24/2011   Obesity (BMI 30-39.9) 01/22/2011   Anxiety and depression 10/24/2009   Hyperlipidemia 01/05/2008   Lymphedema 01/04/2007   HIDRADENITIS SUPPURATIVA 08/17/2006   Hypothyroidism 05/05/2006   History of tobacco use 05/05/2006   Episodic mood disorder ([place]) 05/05/2006    Conditions to be addressed/monitored: dental needs and community support  Care Plan : Clinical Social Work  Updates made by Maurine Cane, LCSW since 08/22/2020 12:00 AM   Problem: need community support    Goal: Connect patient with resources to meet dental needs Completed 08/22/2020  Start Date: 05/09/2020  Expected End Date: 09/04/2020  This Visit's Progress: On track  Recent Progress: On track  Priority: High  Note:   Current barriers:  Goal completed Patient . (Needs $545 for dental guard ) insurance will cover most cost but she is unable to pay her portion.  Dr. Everlean Cherry, [place] for the [place]    [place] [phone] Acknowledges deficits with meeting these unmet need and unable to independently navigate community resource options without care coordination support Clinical Goals: patient will work with SW to address concerns related to unmet needs Clinical Interventions:  Inter-disciplinary care team  collaboration (see longitudinal plan of care) Patient completed impression appointment and will pick up mouth guards in 1 to 2 weeks Funds for patient's mouth guard have been approved by [employer] Patient Assistance; provided updated to patient and Candice at dental office Share with [facility] via e-mail contact person; received response continue to coordinate payment Collaborated with Destiny at dental office on payment process ; patient's appointment to make the impressions is June 14th at 9:00 Collaboration with [employer] [phone]) at patient's dental office for needed information  LCSW submitted application to [facility] patient assistance program in to cover the cost of the mouth guard.  Problem-solving facilitated; Resource information provided; Support and encouragement provided Patient Goals/Self-Care Activities: Over the next 30 days Pick up mouth guard when it is ready Per our conversation I will transfer you to the Managed Medicaid care manager for your on going needs      Casimer Lanius, [place] / [place]   [phone] 10:06 AM

## 2020-08-27 ENCOUNTER — Other Ambulatory Visit: Payer: Self-pay

## 2020-08-27 ENCOUNTER — Ambulatory Visit (INDEPENDENT_AMBULATORY_CARE_PROVIDER_SITE_OTHER): Payer: Medicaid Other | Admitting: Student in an Organized Health Care Education/Training Program

## 2020-08-27 ENCOUNTER — Encounter: Payer: Self-pay | Admitting: Student in an Organized Health Care Education/Training Program

## 2020-08-27 DIAGNOSIS — E785 Hyperlipidemia, unspecified: Secondary | ICD-10-CM

## 2020-08-27 DIAGNOSIS — Z780 Asymptomatic menopausal state: Secondary | ICD-10-CM

## 2020-08-27 MED ORDER — ESCITALOPRAM OXALATE 10 MG PO TABS: 10.0000 mg | ORAL_TABLET | Freq: Every day | ORAL | 1 refills | Status: DC

## 2020-08-27 MED ORDER — PRAVASTATIN SODIUM 40 MG PO TABS: 40.0000 mg | ORAL_TABLET | Freq: Every day | ORAL | 3 refills | Status: DC

## 2020-08-27 NOTE — Patient Instructions (Signed)
It was a pleasure to see you today!  To summarize our discussion for this visit: We have increased your cholesterol medication. I would like you to get your cholesterol rechecked in about 6 weeks. Below are some more tips to a healthier lifestyle to help lower cholesterol as well. Please follow up with Dr. Hartford Poli as soon as possible. We are also going to try escitalopram today as a treatment for your menopause symptoms. Please follow up with your new primary doctor in a couple weeks to discuss how it is working for you.  Some additional health maintenance measures we should update are: Health Maintenance Due  Topic Date Due   Pneumococcal Vaccine 62-54 Years old (1 - PCV) Never done   Zoster Vaccines- Shingrix (1 of 2) Never done   COVID-19 Vaccine (4 - Booster for Pfizer series) 08/14/2020    Call the clinic at [phone] if your symptoms worsen or you have any concerns.   Thank you for allowing me to take part in your care,  Dr. Doristine Mango  Cholesterol Content in Foods Cholesterol is a waxy, fat-like substance that helps to carry fat in the blood. The body needs cholesterol in small amounts, but too much cholesterol can causedamage to the arteries and heart. Most people should eat less than 200 milligrams (mg) of cholesterol a day. Foods with cholesterol  Cholesterol is found in animal-based foods, such as meat, seafood, and dairy. Generally, low-fat dairy and lean meats have less cholesterol than full-fat dairy and fatty meats. The milligrams of cholesterol per serving (mg per serving) of common cholesterol-containing foods are listed below. Meat and other proteins Egg -- one large whole egg has 186 mg. Veal shank -- 4 oz has 141 mg. Lean ground [place] (93% lean) -- 4 oz has 118 mg. Fat-trimmed lamb loin -- 4 oz has 106 mg. Lean ground beef (90% lean) -- 4 oz has 100 mg. Lobster -- 3.5 oz has 90 mg. Pork loin chops -- 4 oz has 86 mg. Canned salmon -- 3.5 oz has 83  mg. Fat-trimmed beef top loin -- 4 oz has 78 mg. Frankfurter -- 1 frank (3.5 oz) has 77 mg. Crab -- 3.5 oz has 71 mg. Roasted chicken without skin, white meat -- 4 oz has 66 mg. Light bologna -- 2 oz has 45 mg. Deli-cut [place] -- 2 oz has 31 mg. Canned tuna -- 3.5 oz has 31 mg. Berniece Salines -- 1 oz has 29 mg. Oysters and mussels (raw) -- 3.5 oz has 25 mg. Mackerel -- 1 oz has 22 mg. Trout -- 1 oz has 20 mg. Pork sausage -- 1 link (1 oz) has 17 mg. Salmon -- 1 oz has 16 mg. Tilapia -- 1 oz has 14 mg. Dairy Soft-serve ice cream --  cup (4 oz) has 103 mg. Whole-milk yogurt -- 1 cup (8 oz) has 29 mg. Cheddar cheese -- 1 oz has 28 mg. American cheese -- 1 oz has 28 mg. Whole milk -- 1 cup (8 oz) has 23 mg. 2% milk -- 1 cup (8 oz) has 18 mg. Cream cheese -- 1 tablespoon (Tbsp) has 15 mg. Cottage cheese --  cup (4 oz) has 14 mg. Low-fat (1%) milk -- 1 cup (8 oz) has 10 mg. Sour cream -- 1 Tbsp has 8.5 mg. Low-fat yogurt -- 1 cup (8 oz) has 8 mg. Nonfat Greek yogurt -- 1 cup (8 oz) has 7 mg. Half-and-half cream -- 1 Tbsp has 5 mg. Fats and oils Cod liver  oil -- 1 tablespoon (Tbsp) has 82 mg. Butter -- 1 Tbsp has 15 mg. Lard -- 1 Tbsp has 14 mg. Bacon grease -- 1 Tbsp has 14 mg. Mayonnaise -- 1 Tbsp has 5-10 mg. Margarine -- 1 Tbsp has 3-10 mg. Exact amounts of cholesterol in these foods may vary depending on specificingredients and brands. Foods without cholesterol Most plant-based foods do not have cholesterol unless you combine them with a food that has cholesterol. Foods without cholesterol include: Grains and cereals. Vegetables. Fruits. Vegetable oils, such as olive, canola, and sunflower oil. Legumes, such as peas, beans, and lentils. Nuts and seeds. Egg whites. Summary The body needs cholesterol in small amounts, but too much cholesterol can cause damage to the arteries and heart. Most people should eat less than 200 milligrams (mg) of cholesterol a day. This information is  not intended to replace advice given to you by your health care provider. Make sure you discuss any questions you have with your healthcare provider. Document Revised: 06/05/2019 Document Reviewed: 07/16/2019 Elsevier Patient Education  [place].  Escitalopram Solution What is this medication? ESCITALOPRAM (es sye TAL oh pram) treats depression and anxiety. It increases the amount of serotonin in the brain, a hormone that helps regulate mood. Itbelongs to a group of medications called SSRIs. This medicine may be used for other purposes; ask your health care provider orpharmacist if you have questions. COMMON BRAND NAME(S): Lexapro What should I tell my care team before I take this medication? They need to know if you have any of these conditions: Bipolar disorder or a family history of bipolar disorder Diabetes Glaucoma Heart disease Kidney or liver disease Receiving electroconvulsive therapy Seizures Suicidal thoughts, plans, or attempt by you or a family member An unusual or allergic reaction to escitalopram, citalopram, other medications, foods, dyes, or preservatives Pregnant or trying to become pregnant Breast-feeding How should I use this medication? Take this medication by mouth. Follow the directions on the prescription label. Use a specially marked spoon or container to measure your medication. Ask your pharmacist if you do not have one. Household spoons are not accurate. This medication can be taken with or without food. Take your medication at regular intervals. Do not take it more often than directed. Do not stop taking this medication suddenly except upon the advice of your care team. Stopping this medication too quickly may cause serious side effects or your condition mayworsen. A special MedGuide will be given to you by the pharmacist with eachprescription and refill. Be sure to read this information carefully each time. Talk to your care team regarding the use of  this medication in children.Special care may be needed. Overdosage: If you think you have taken too much of this medicine contact apoison control center or emergency room at once. NOTE: This medicine is only for you. Do not share this medicine with others. What if I miss a dose? If you miss a dose, take it as soon as you can. If it is almost time for yournext dose, take only that dose. Do not take double or extra doses. What may interact with this medication? Do not take this medication with any of the following: Certain medications for fungal infections like fluconazole, itraconazole, ketoconazole, posaconazole, voriconazole Cisapride Citalopram Dronedarone Linezolid MAOIs like Carbex, Eldepryl, Marplan, Nardil, and Parnate Methylene blue (injected into a vein) Pimozide Thioridazine This medication may also interact with the following: Alcohol Amphetamines Aspirin and aspirin-like medications Carbamazepine Certain medications for depression, anxiety, or psychotic disturbances  Certain medications for migraine headache like almotriptan, eletriptan, frovatriptan, naratriptan, rizatriptan, sumatriptan, zolmitriptan Certain medications for sleep Certain medications that treat or prevent blood clots like warfarin, enoxaparin, and dalteparin Cimetidine Diuretics Dofetilide Fentanyl Furazolidone Isoniazid Lithium Metoprolol NSAIDs, medications for pain and inflammation, like ibuprofen or naproxen Other medications that prolong the QT interval (cause an abnormal heart rhythm) Procarbazine Rasagiline Supplements like St. John's wort, kava kava, valerian Tramadol Tryptophan Ziprasidone This list may not describe all possible interactions. Give your health care provider a list of all the medicines, herbs, non-prescription drugs, or dietary supplements you use. Also tell them if you smoke, drink alcohol, or use illegaldrugs. Some items may interact with your medicine. What should I  watch for while using this medication? Tell your care team if your symptoms do not get better or if they get worse. Visit your care team for regular checks on your progress. Because it may take several weeks to see the full effects of this medication, it is important tocontinue your treatment as prescribed by your care team. Watch for new or worsening thoughts of suicide or depression. This includes sudden changes in mood, behaviors, or thoughts. These changes can happen at any time but are more common in the beginning of treatment or after a change in dose. Call your care team right away if you experience these thoughts orworsening depression. Manic episodes may happen in patients with bipolar disorder who take this medication. Watch for changes in feelings or behaviors such as feeling anxious, nervous, agitated, panicky, irritable, hostile, aggressive, impulsive, severely restless, overly excited and hyperactive, or trouble sleeping. These symptoms can happen at any time but are more common in the beginning of treatment or after a change in dose. Call your care team right away if you notice any ofthese symptoms. You may get drowsy or dizzy. Do not drive, use machinery, or do anything that needs mental alertness until you know how this medication affects you. Do not stand or sit up quickly, especially if you are an older patient. This reduces the risk of dizzy or fainting spells. Alcohol may interfere with the effect ofthis medication. Avoid alcoholic drinks. Your mouth may get dry. Chewing sugarless gum or sucking hard candy, and drinking plenty of water may help. Contact your care team if the problem doesnot go away or is severe. What side effects may I notice from receiving this medication? Side effects that you should report to your care team as soon as possible: Allergic reactions-skin rash, itching, hives, swelling of the face, lips, tongue, or throat Bleeding-bloody or black, tar-like stools, red or  dark brown urine, vomiting blood or brown material that looks like coffee grounds, small, red or purple spots on skin, unusual bleeding or bruising Heart rhythm changes-fast or irregular heartbeat, dizziness, feeling faint or lightheaded, chest pain, trouble breathing Low sodium level-muscle weakness, fatigue, dizziness, headache, confusion Serotonin syndrome-irritability, confusion, fast or irregular heartbeat, muscle stiffness, twitching muscles, sweating, high fever, seizure, chills, vomiting, diarrhea Sudden eye pain or change in vision such as blurry vision, seeing halos around lights, vision loss Thoughts of suicide or self-harm, worsening mood, feelings of depression Side effects that usually do not require medical attention (report to your careteam if they continue or are bothersome): Change in sex drive or performance Diarrhea Excessive sweating Nausea Tremors or shaking Upset stomach This list may not describe all possible side effects. Call your doctor for medical advice about side effects. You may report side effects to FDA at1-800-FDA-1088. Where should I  keep my medication? Keep out of reach of children and pets. Store at room temperature between 15 and 30 degrees C (59 and 86 degrees F).Throw away any unused medication after the expiration date. NOTE: This sheet is a summary. It may not cover all possible information. If you have questions about this medicine, talk to your doctor, pharmacist, orhealth care provider.  2022 Elsevier/Gold Standard (2020-01-14 09:37:28)

## 2020-08-27 NOTE — Progress Notes (Signed)
    SUBJECTIVE:   CHIEF COMPLAINT / HPI: follow up  Hyperlipidemia- LDL 127. Adherent with pravastatin. Does not have active lifestyle or cholesterol conscience diet.  Aunt is doing better at SNF  Menopause- continues to have hot flashes. Feels that she has mood concerns and memory concerns related to menopause.   OBJECTIVE:   BP (!) 127/94   Pulse 68   Wt 254 lb 12.8 oz (115.6 kg)   SpO2 100%   BMI 37.63 kg/m   Physical Exam Vitals and nursing note reviewed.  Constitutional:      Appearance: Normal appearance.  HENT:     Head: Normocephalic.     Nose: Nose normal.  Eyes:     Conjunctiva/sclera: Conjunctivae normal.  Cardiovascular:     Rate and Rhythm: Normal rate and regular rhythm.  Pulmonary:     Effort: Pulmonary effort is normal.     Breath sounds: Normal breath sounds.  Musculoskeletal:     Cervical back: No tenderness.     Right lower leg: Edema present.     Left lower leg: Edema present.  Lymphadenopathy:     Cervical: No cervical adenopathy.  Skin:    General: Skin is warm and dry.     Capillary Refill: Capillary refill takes less than 2 seconds.  Neurological:     General: No focal deficit present.     Mental Status: She is alert and oriented to person, place, and time.  Psychiatric:        Mood and Affect: Mood normal.        Behavior: Behavior normal.        Thought Content: Thought content normal.        Judgment: Judgment normal.   ASSESSMENT/PLAN:   Menopause Patient has failed estrogen topical and PO and continues to have symptoms.  Primarily mood symptoms are concerning for her. Recommended continuing therapy with Dr. Hartford Poli. Starting escitalopram today and dicussed risk factors as well as provided hand out.  Patient will follow up with new PCP in 2 weeks.  Hyperlipidemia Increased statin dose Discussed healthy lifestyle changes including activity and diet   The 10-year ASCVD risk score Mikey Bussing DC Jr., et al., 2013) is: 5.2%   Values  used to calculate the score:     Age: 55 years     Sex: Female     Is Non-Hispanic African American: Yes     Diabetic: No     Tobacco smoker: Yes     Systolic Blood Pressure: 299 mmHg     Is BP treated: No     HDL Cholesterol: 70 mg/dL     Total Cholesterol: 213 mg/dL   Richarda Osmond, [place]

## 2020-08-29 NOTE — Assessment & Plan Note (Signed)
Increased statin dose Discussed healthy lifestyle changes including activity and diet

## 2020-08-29 NOTE — Assessment & Plan Note (Signed)
Patient has failed estrogen topical and PO and continues to have symptoms.  Primarily mood symptoms are concerning for her. Recommended continuing therapy with Dr. Hartford Poli. Starting escitalopram today and dicussed risk factors as well as provided hand out.  Patient will follow up with new PCP in 2 weeks.

## 2020-09-03 ENCOUNTER — Other Ambulatory Visit: Payer: Self-pay | Admitting: Student in an Organized Health Care Education/Training Program

## 2020-09-04 ENCOUNTER — Other Ambulatory Visit: Payer: Self-pay

## 2020-09-04 ENCOUNTER — Other Ambulatory Visit: Payer: Self-pay | Admitting: Student in an Organized Health Care Education/Training Program

## 2020-09-04 DIAGNOSIS — E890 Postprocedural hypothyroidism: Secondary | ICD-10-CM

## 2020-09-04 MED ORDER — OXYCODONE HCL 5 MG PO TABS: 5.0000 mg | ORAL_TABLET | Freq: Two times a day (BID) | ORAL | 0 refills | Status: DC | PRN

## 2020-09-22 ENCOUNTER — Ambulatory Visit (INDEPENDENT_AMBULATORY_CARE_PROVIDER_SITE_OTHER): Payer: Medicaid Other | Admitting: Family Medicine

## 2020-09-22 ENCOUNTER — Other Ambulatory Visit: Payer: Self-pay

## 2020-09-22 ENCOUNTER — Ambulatory Visit: Payer: Self-pay

## 2020-09-22 ENCOUNTER — Encounter: Payer: Self-pay | Admitting: Family Medicine

## 2020-09-22 ENCOUNTER — Telehealth: Payer: Self-pay | Admitting: Family Medicine

## 2020-09-22 VITALS — BP 134/99 | HR 72 | Wt 249.6 lb

## 2020-09-22 DIAGNOSIS — F39 Unspecified mood [affective] disorder: Secondary | ICD-10-CM | POA: Diagnosis not present

## 2020-09-22 DIAGNOSIS — I890 Lymphedema, not elsewhere classified: Secondary | ICD-10-CM

## 2020-09-22 DIAGNOSIS — R030 Elevated blood-pressure reading, without diagnosis of hypertension: Secondary | ICD-10-CM

## 2020-09-22 DIAGNOSIS — G629 Polyneuropathy, unspecified: Secondary | ICD-10-CM | POA: Diagnosis not present

## 2020-09-22 MED ORDER — PREGABALIN 150 MG PO CAPS: 300.0000 mg | ORAL_CAPSULE | Freq: Two times a day (BID) | ORAL | 0 refills | Status: DC

## 2020-09-22 NOTE — Progress Notes (Signed)
    SUBJECTIVE:   CHIEF COMPLAINT / HPI:   Ms. Vasudevan 55 yo F who presents the following:  Mood check/Neuropathy  Following up for mood check. Denies SI/HI believes her mood fluctuates because of menopause. She did not start taking lexapro and would not like to take at this time.  Takes hydroxyzine as needed for anxiety.  Would like to increase Lyrica to 300 twice daily for neuropathy and mood.  Elevated BP reading - Medications: Lasix 40 mg daily (for lymphedema) - Compliance: Yes first - Denies any SOB, CP, vision changes, LE edema, medication SEs  Lymphedema Would like to have a referral to physical therapy to continue to do Unna boots.  Has compression stockings at home occasionally wears.  PERTINENT  PMH / PSH: As above.   OBJECTIVE:   BP (!) 134/99   Pulse 72   Wt 249 lb 9.6 oz (113.2 kg)   SpO2 98%   BMI 36.86 kg/m   PHQ9 SCORE ONLY 09/22/2020 08/27/2020 08/12/2020  PHQ-9 Total Score 18 13 17    General: Appears well, no acute distress. Age appropriate. Cardiac: RRR, normal heart sounds, no murmurs Respiratory: CTAB, normal effort Extremities: Marked LE edema  ASSESSMENT/PLAN:  Episodic Mood disorder Per chart review mood concerns due to menopausal hormonal imbalance. Suggested lexapro; patient refused. Would like to increase lyrica for neuropathy but could also have benefit for mood. Increase lyrical from 150 BID to 300 BID. - pregabalin (LYRICA) 150 MG capsule; Take 2 capsules (300 mg total) by mouth 2 (two) times daily.  Dispense: 180 capsule; Refill: 0 -F/U in 1 week; repeat PHQ9  Lymphedema Chronic. Markedly edematous LE.  - Continue lasix - Ambulatory referral to Physical Therapy  Elevated blood pressure reading No formal diagnosis of HTN. Evidence of elevated DBP last office visit.  -BP log -Return in 1 week; consider starting antihypertensive if BPs remain elevated   Gerlene Fee, [place]

## 2020-09-22 NOTE — Patient Instructions (Addendum)
Thank you for coming in today it was nice to meet you.  Today your blood pressure was elevated. Please keep a log taking blood pressure 1 time daily. Return in 1 week for blood pressure recheck.   I have increased your Lyrica from 150 twice daily to 300 twice daily this can also help with mood.   Dr. Janus Molder

## 2020-09-22 NOTE — Telephone Encounter (Signed)
I attempted to reach this patient today to let her know that the LCSW had an emergency and would not be able to  make the appt. When I called the number it would not ring or make any sound. No recording or busy signal. I will attempt to reach the patient again tomorrow.  [place]

## 2020-10-03 ENCOUNTER — Encounter: Payer: Self-pay | Admitting: Family Medicine

## 2020-10-03 ENCOUNTER — Other Ambulatory Visit: Payer: Self-pay | Admitting: Student in an Organized Health Care Education/Training Program

## 2020-10-03 ENCOUNTER — Ambulatory Visit (INDEPENDENT_AMBULATORY_CARE_PROVIDER_SITE_OTHER): Payer: Medicaid Other | Admitting: Family Medicine

## 2020-10-03 ENCOUNTER — Telehealth: Payer: Self-pay

## 2020-10-03 ENCOUNTER — Other Ambulatory Visit: Payer: Self-pay

## 2020-10-03 ENCOUNTER — Telehealth: Payer: Self-pay | Admitting: Licensed Clinical Social Worker

## 2020-10-03 VITALS — BP 128/88 | HR 76 | Ht 68.0 in | Wt 248.4 lb

## 2020-10-03 DIAGNOSIS — M545 Low back pain, unspecified: Secondary | ICD-10-CM

## 2020-10-03 DIAGNOSIS — F39 Unspecified mood [affective] disorder: Secondary | ICD-10-CM

## 2020-10-03 DIAGNOSIS — R110 Nausea: Secondary | ICD-10-CM

## 2020-10-03 DIAGNOSIS — E785 Hyperlipidemia, unspecified: Secondary | ICD-10-CM

## 2020-10-03 DIAGNOSIS — L301 Dyshidrosis [pompholyx]: Secondary | ICD-10-CM

## 2020-10-03 DIAGNOSIS — R030 Elevated blood-pressure reading, without diagnosis of hypertension: Secondary | ICD-10-CM

## 2020-10-03 MED ORDER — OXYCODONE HCL 5 MG PO TABS: 5.0000 mg | ORAL_TABLET | Freq: Two times a day (BID) | ORAL | 0 refills | Status: DC | PRN

## 2020-10-03 MED ORDER — ONDANSETRON HCL 4 MG PO TABS: ORAL_TABLET | ORAL | 0 refills | Status: DC

## 2020-10-03 MED ORDER — CLOBETASOL PROPIONATE 0.05 % EX CREA: TOPICAL_CREAM | Freq: Two times a day (BID) | CUTANEOUS | 3 refills | Status: DC

## 2020-10-03 NOTE — Progress Notes (Signed)
Patient not seen. Came for BP check today. BP check wnl. Will not start antihypertensives. Medication refills as below.   Dyshidrotic eczema - clobetasol cream (TEMOVATE) 0.05 %; Apply topically 2 (two) times daily.  Dispense: 120 g; Refill: 3  Acute midline low back pain without sciatica - oxyCODONE (OXY IR/ROXICODONE) 5 MG immediate release tablet; Take 1 tablet (5 mg total) by mouth every 12 (twelve) hours as needed for severe pain.  Dispense: 60 tablet; Refill: 0  Nausea - ondansetron (ZOFRAN) 4 MG tablet; TAKE 1 TABLET(4 MG) BY MOUTH EVERY 8 HOURS AS NEEDED  Dispense: 15 tablet; Refill: 0

## 2020-10-03 NOTE — Patient Outreach (Signed)
[facility]) Care Management  10/03/2020  [name] 1965/09/06 [id number]  Erroneousness encounter  Eula Fried, BSW, MSW, LCSW Managed Medicaid [email or web address] [email or web address] Phone: [phone]

## 2020-10-03 NOTE — Telephone Encounter (Signed)
Pharmacy calls nurse line reporting prescribing DEA is not covered under medicaid for pain prescriptions.   Will forward to afternoon preceptor.

## 2020-10-04 ENCOUNTER — Other Ambulatory Visit: Payer: Self-pay | Admitting: Student in an Organized Health Care Education/Training Program

## 2020-10-06 MED ORDER — OXYCODONE HCL 5 MG PO TABS: 5.0000 mg | ORAL_TABLET | Freq: Two times a day (BID) | ORAL | 0 refills | Status: DC | PRN

## 2020-10-06 NOTE — Telephone Encounter (Signed)
Rx Oxycodone 5 mg #60 sent in place of Dr Janus Molder

## 2020-10-07 ENCOUNTER — Other Ambulatory Visit: Payer: Self-pay | Admitting: Student in an Organized Health Care Education/Training Program

## 2020-10-07 DIAGNOSIS — F32A Depression, unspecified: Secondary | ICD-10-CM

## 2020-10-19 NOTE — Progress Notes (Deleted)
    SUBJECTIVE:   CHIEF COMPLAINT / HPI:   Medication Refills [name] is a 55 y.o. female who presents today for medication refills. Would like ***.   Chronic Pain  Lumbar Spondylosis Has been taking Oxy 5 mg for *** years. Last refill was 7/30 for 30 days. Has worked with PT in the past***.   PERTINENT  PMH / PSH:  Hypothyroidism, lumbar spondylosis, chronic pain, HLD   OBJECTIVE:   There were no vitals taken for this visit. ***  General: NAD, pleasant, able to participate in exam Cardiac: RRR, no murmurs. Respiratory: CTAB, normal effort, No wheezes, rales or rhonchi Abdomen: Bowel sounds present, nontender, nondistended, no hepatosplenomegaly. Extremities: no edema or cyanosis. Skin: warm and dry, no rashes noted Neuro: alert, no obvious focal deficits Psych: Normal affect and mood  ASSESSMENT/PLAN:   No problem-specific Assessment & Plan notes found for this encounter.     [name], [place]

## 2020-10-21 ENCOUNTER — Ambulatory Visit: Payer: Medicaid Other | Admitting: Family Medicine

## 2020-10-27 ENCOUNTER — Ambulatory Visit: Payer: Medicaid Other

## 2020-10-28 ENCOUNTER — Other Ambulatory Visit: Payer: Self-pay | Admitting: Student in an Organized Health Care Education/Training Program

## 2020-11-02 NOTE — Progress Notes (Signed)
    SUBJECTIVE:   CHIEF COMPLAINT / HPI:   Medication Refills [name] is a 55 y.o. female who presents today for medication refills and to discuss menopause.  Menopause Symptoms have been on-going for about 1 year. Takes Seroquel for sleep.  She feels that she gets agitated very easily and she does not like this.  She states that she is generally not an angry person and does not like to be unhappy.  She is currently taking Lexapro 10 mg daily, has found some benefit with this.  She previously saw Dr. Oren Binet and is interested in meeting with her again.  She notes recent life events have kept her from scheduling another appointment but she will call to reschedule.  Chronic Pain  Lumbar Spondylosis For which she has been taking Oxy 5 mg for the last year and a half. States she was on 150 earlier. Happy to have decreased to 5 mg. Last refill was 7/30 for 30 days.   Family History of T2DM Patient would like a hemoglobin A1c to test for diabetes as she states she has a strong family history of this.  This would give her peace of mind that she would rather know earlier rather than later.  PERTINENT  PMH / PSH:  Hypothyroidism, lumbar spondylosis, chronic pain, HLD   OBJECTIVE:   BP 117/79   Pulse 63   Ht '5\' 8"'$  (1.727 m)   Wt 247 lb 12.8 oz (112.4 kg)   SpO2 96%   BMI 37.68 kg/m    General: NAD, pleasant, well-dressed, able to participate in exam Respiratory: Breathing comfortably on room air, no respiratory distress Psych: Normal affect and mood  ASSESSMENT/PLAN:   Menopause Has previously tried p.o. and estrogen cream, without improvement.  She mostly has mood related symptoms and hot flashes. -Increased Lexapro to 20 mg daily -Encourage follow-up with Dr. Hartford Poli for continued therapy -Advised patient to try black cohosh and soy for hot flashes  Spondylosis of lumbar region without myelopathy or radiculopathy Chronically on Oxy 5 mg BID. She has been weaned to this from  higher doses and feels comfortable with this dosage. She is also happy to be doing well at a lower dose. No changes today. Has been sent to PT multiple times in the past. -Refilled today.  Family history of diabetes mellitus Reports family history of type 2 diabetes.  She would like her hemoglobin A1c checked today.  Reassured patient that she has had her hemoglobin A1c checked every year for the last 4 years and it has been normal.  Checked Hgb A1c for patients piece of mind and it was 5.1. She was very grateful to hear this.  Medication refill Refilled patient's Oxy 5 milligrams and clobetasol cream which she uses for eczema.      [name], [place]

## 2020-11-02 NOTE — Patient Instructions (Addendum)
It was wonderful to meet you today.  Please bring ALL of your medications with you to every visit.   Today we talked about:  -We are increasing your Lexapro to 20 mg daily. Please come back and see me for a follow up. -I would encourage you to call to schedule an appointment with Dr. Hartford Poli.  -I will refill your 5 mg of Oxycodone.  -For your menopause, you can try black cohosh and soy to help. -We are checking your Hemoglobin A1c for piece of mind! I suspect it will be normal.   Thank you for choosing [name].   Please call [phone] with any questions about today's appointment.  Please be sure to schedule follow up at the front  desk before you leave today.   [name], DO PGY-2 Family Medicine

## 2020-11-03 ENCOUNTER — Other Ambulatory Visit: Payer: Self-pay

## 2020-11-03 DIAGNOSIS — M545 Low back pain, unspecified: Secondary | ICD-10-CM

## 2020-11-04 ENCOUNTER — Ambulatory Visit (INDEPENDENT_AMBULATORY_CARE_PROVIDER_SITE_OTHER): Payer: Medicaid Other | Admitting: Family Medicine

## 2020-11-04 ENCOUNTER — Encounter: Payer: Self-pay | Admitting: Family Medicine

## 2020-11-04 ENCOUNTER — Other Ambulatory Visit: Payer: Self-pay

## 2020-11-04 VITALS — BP 117/79 | HR 63 | Ht 68.0 in | Wt 247.8 lb

## 2020-11-04 DIAGNOSIS — L301 Dyshidrosis [pompholyx]: Secondary | ICD-10-CM

## 2020-11-04 DIAGNOSIS — Z780 Asymptomatic menopausal state: Secondary | ICD-10-CM

## 2020-11-04 DIAGNOSIS — Z833 Family history of diabetes mellitus: Secondary | ICD-10-CM | POA: Diagnosis not present

## 2020-11-04 DIAGNOSIS — Z760 Encounter for issue of repeat prescription: Secondary | ICD-10-CM | POA: Insufficient documentation

## 2020-11-04 DIAGNOSIS — M47816 Spondylosis without myelopathy or radiculopathy, lumbar region: Secondary | ICD-10-CM

## 2020-11-04 DIAGNOSIS — E669 Obesity, unspecified: Secondary | ICD-10-CM | POA: Diagnosis not present

## 2020-11-04 LAB — POCT GLYCOSYLATED HEMOGLOBIN (HGB A1C): Hemoglobin A1C: 5.1 % (ref 4.0–5.6)

## 2020-11-04 MED ORDER — CLOBETASOL PROPIONATE 0.05 % EX CREA: TOPICAL_CREAM | Freq: Two times a day (BID) | CUTANEOUS | 3 refills | Status: DC

## 2020-11-04 MED ORDER — OXYCODONE HCL 5 MG PO TABS: 5.0000 mg | ORAL_TABLET | Freq: Two times a day (BID) | ORAL | 0 refills | Status: DC | PRN

## 2020-11-04 MED ORDER — ESCITALOPRAM OXALATE 10 MG PO TABS: 20.0000 mg | ORAL_TABLET | Freq: Every day | ORAL | 0 refills | Status: DC

## 2020-11-04 NOTE — Assessment & Plan Note (Signed)
Refilled patient's Oxy 5 milligrams and clobetasol cream which she uses for eczema.

## 2020-11-04 NOTE — Assessment & Plan Note (Signed)
Chronically on Oxy 5 mg BID. She has been weaned to this from higher doses and feels comfortable with this dosage. She is also happy to be doing well at a lower dose. No changes today. Has been sent to PT multiple times in the past. -Refilled today.

## 2020-11-04 NOTE — Assessment & Plan Note (Signed)
Has previously tried p.o. and estrogen cream, without improvement.  She mostly has mood related symptoms and hot flashes. -Increased Lexapro to 20 mg daily -Encourage follow-up with Dr. Hartford Poli for continued therapy -Advised patient to try black cohosh and soy for hot flashes

## 2020-11-04 NOTE — Assessment & Plan Note (Addendum)
Reports family history of type 2 diabetes.  She would like her hemoglobin A1c checked today.  Reassured patient that she has had her hemoglobin A1c checked every year for the last 4 years and it has been normal.  Checked Hgb A1c for patients piece of mind and it was 5.1. She was very grateful to hear this.

## 2020-11-06 ENCOUNTER — Other Ambulatory Visit: Payer: Self-pay | Admitting: Family Medicine

## 2020-11-13 ENCOUNTER — Ambulatory Visit: Payer: Self-pay

## 2020-11-14 ENCOUNTER — Telehealth: Payer: Self-pay | Admitting: Licensed Clinical Social Worker

## 2020-11-14 ENCOUNTER — Telehealth: Payer: Self-pay | Admitting: Family Medicine

## 2020-11-14 NOTE — Telephone Encounter (Signed)
..   Medicaid Managed Care   Unsuccessful Outreach Note  11/14/2020 Name: [name] MRN: [id number] DOB: 1966/03/08  Referred by: [name], DO Reason for referral : High Risk Managed Medicaid (I called the patient today to get her phone visit with the LCSW rescheduled. I left a message on her VM with my name and number.)   An unsuccessful telephone outreach was attempted today. The patient was referred to the case management team for assistance with care management and care coordination.   Follow Up Plan: The care management team will reach out to the patient again over the next 7 days.   [place]

## 2020-11-14 NOTE — Patient Outreach (Signed)
  [facility]) Care Management  [facility] Social Work  11/14/2020  [name] [birth year] [id number]  Encounter Medications:  Outpatient Encounter Medications as of 11/14/2020  Medication Sig Note   albuterol (VENTOLIN HFA) 108 (90 Base) MCG/ACT inhaler INHALE 2 PUFFS INTO THE LUNGS EVERY 4 HOURS AS NEEDED FOR SHORTNESS OF BREATH    clobetasol cream (TEMOVATE) 0.05 % Apply topically 2 (two) times daily.    cyclobenzaprine (FLEXERIL) 5 MG tablet TAKE 1 TABLET(5 MG) BY MOUTH AT BEDTIME AS NEEDED FOR MUSCLE SPASMS    dexlansoprazole (DEXILANT) 60 MG capsule Take 1 capsule (60 mg total) by mouth daily.    Elastic Bandages & Supports (MEDICAL COMPRESSION STOCKINGS) MISC 2 Units by Does not apply route daily.    escitalopram (LEXAPRO) 10 MG tablet Take 2 tablets (20 mg total) by mouth daily.    fluticasone (FLONASE) 50 MCG/ACT nasal spray Place 1 spray into both nostrils daily. 1 spray in each nostril every day    furosemide (LASIX) 40 MG tablet TAKE 1 TABLET(40 MG) BY MOUTH DAILY 11/04/2020: Taking as needed.    hydrOXYzine (ATARAX/VISTARIL) 10 MG tablet Take 10 mg by mouth daily as needed for anxiety.    levothyroxine (SYNTHROID) 150 MCG tablet TAKE 1 TABLET(150 MCG) BY MOUTH DAILY BEFORE AND BREAKFAST    ondansetron (ZOFRAN) 4 MG tablet TAKE 1 TABLET(4 MG) BY MOUTH EVERY 8 HOURS AS NEEDED    oxyCODONE (OXY IR/ROXICODONE) 5 MG immediate release tablet Take 1 tablet (5 mg total) by mouth every 12 (twelve) hours as needed for severe pain.    polyethylene glycol powder (GLYCOLAX/MIRALAX) 17 GM/SCOOP powder Take 17 g by mouth 2 (two) times daily as needed. (Patient not taking: Reported on 11/04/2020)    pravastatin (PRAVACHOL) 40 MG tablet Take 1 tablet (40 mg total) by mouth at bedtime.    pregabalin (LYRICA) 150 MG capsule Take 2 capsules (300 mg total) by mouth 2 (two) times daily.    QUEtiapine (SEROQUEL) 50 MG tablet TAKE 1 TABLET(50 MG) BY MOUTH AT BEDTIME    SPIRIVA HANDIHALER 18  MCG inhalation capsule PLACE 1 CAPSULE INTO THE INHALER AND INHALE DAILY AS NEEDED    No facility-administered encounter medications on file as of 11/14/2020.    Functional Status:  No flowsheet data found.  Fall/Depression Screening:  PHQ 2/9 Scores 11/04/2020 09/22/2020 08/27/2020 08/12/2020 07/01/2020 05/30/2020 04/30/2020  PHQ - 2 Score '6 4 1 5 4 4 4  '$ PHQ- 9 Score '19 18 13 17 12 12 13    '$ LCSW completed [facility] outreach attempt on 11/13/20 but was unable to reach patient successfully. A HIPPA compliant voice message was left encouraging patient to return call once available. LCSW will ask Scheduling Care Guide to reschedule [facility] SW appointment with patient as well.  Eula Fried, BSW, MSW, [employer] Managed Medicaid [email or web address] Phone: [phone]

## 2020-11-14 NOTE — Patient Instructions (Signed)
[name] ,   The [facility] Managed Care Team is available to provide assistance to you with your healthcare needs at no cost and as a benefit of your [name] Health plan. I'm sorry I was unable to reach you today for our scheduled appointment. Our care guide will call you to reschedule our telephone appointment. Please call me at the number below. I am available to be of assistance to you regarding your healthcare needs. .   Thank you,   ,Eula Fried, BSW, MSW, LCSW Managed Medicaid [email or web address] [email or web address] Phone: [phone]

## 2020-11-18 ENCOUNTER — Ambulatory Visit: Payer: Medicaid Other | Admitting: Physical Therapy

## 2020-11-25 NOTE — Patient Instructions (Addendum)
It was wonderful to see you today.  Please bring ALL of your medications with you to every visit.   Today we talked about:  -You can try black cohosh and soy to help with the hot flashes.  -I suggest you contact Dr. Hartford Poli again for therapy. Below are resources for therapy as well, if you decide to go elsewhere.  -I would recommend using warm compresses to the area in your groin.   Thank you for choosing [facility].   Please call [phone] with any questions about today's appointment.  Please be sure to schedule follow up at the front  desk before you leave today.   [name], DO PGY-2 Family Medicine   Therapy and Counseling Resources Most providers on this list will take Medicaid. Patients with commercial insurance or Medicare should contact their insurance company to get a list of in network providers.  [facility]:Psychiatry and Counseling [address] [place]. [place], [place] [address] [place], [place], [place] [address]      [place] [address]  [place], [place] [address] [phone]  [place] [address] [place]  [place], [place] [address]       [phone]     [facility] (virtual only) [phone]  Jinny Blossom [facility] [address], [place], [place]  Family Solutions:  [place]. [place] [phone]  [facility]:  [place] [address] [place] [phone]  [facility] (under & uninsured) [address], [place] [phone]    [email or web address]    [place] [address]  [place]    [phone]  [facility] [place]     Phone:  [phone]     [facility]  [place] [address]  [address] [place]      [place], [place] ext [place]: [place], [place], [place]   [place] ([place] therapist) [email or web address]  [place] [address]   [place] [address]    [phone]    [facility]   [address],  [place], [place]   [place]  [place]  [facility]  [address] [place], [place]        [phone]  Open Access/Walk In Clinic under & uninsured  [facility]  [address] [place], [place] Crisis [phone]  [facility] of the [place],  ([place])   [place], [place]: [phone]) 8:30 - 12; 1 - 2:30  Family Service of the [place],  [place], [place]    [phone]):8:30 - 12; 2 - 3PM  [facility] [employer],  [address]  [place]; [phone]):   Mon - Fri 8 AM - 5 PM  [facility] [place]  MWF 12:30 to 3:00 or call to schedule an appointment  [phone]  Specific Provider options Psychology Today  [email or web address] click on find a therapist  enter your zip code left side and select or tailor a therapist for your specific need.   [facility] Provider Directory  [email or web address]  (Medicaid)   Follow all drop down to find a provider  [place] or [email or web address] [address], [place], [place] Recovery support and educational   24- Hour Availability:   [facility]  [address] [place], [place] Crisis [phone]  Family Service of the [employer] [phone]  [place]  [phone]   [place]  [phone] (after  hours)  Therapeutic Alternative/Mobile Crisis   [phone]  [place] National Suicide Hotline  [phone])508-6094 Diamantina Monks)  Call 911 or go to emergency room  [facility]  [phone]);  [place] and [employer]  [phone]); [place], [place], [place], [place], [place], [place], [place]

## 2020-11-25 NOTE — Progress Notes (Signed)
    SUBJECTIVE:   CHIEF COMPLAINT / HPI:   Post-Menopausal Symptoms Patient presents today for follow up on her post-menopausal symptoms. She most experiences mood symptoms and hot flashes. At her last appointment on 8/30, her Lexapro was increased to 20 mg daily. She was encouraged to follow-up with Dr. Hartford Poli for continued therapy. She was also advised to try black cohosh and soy for her hot flashes.  Today, patient states she continues to feel the same. She feels irritable, "mean", and has some panic attacks (twice a day). Has not yet reached out to Dr. Hartford Poli, she has been busy with her older aunt.  PERTINENT  PMH / PSH:  Hypothyroidism, GERD, hyperlipidemia, lymphedema, chronic pain, anxiety depression  OBJECTIVE:   BP (!) 121/95   Pulse 60   Ht 5\' 9"  (1.753 m)   Wt 244 lb 6.4 oz (110.9 kg)   SpO2 96%   BMI 36.09 kg/m    General: NAD, pleasant, well-dressed, able to participate in exam and move to exam table without issues Cardiac: RRR, no murmurs. Respiratory: Breathing comfortably on room air, no respiratory distress Extremities: Bilateral lymphedema Skin: warm and dry, no rashes noted. Around groin area there was an approximately 0.5 x 0.5 cm subcutaneous bump to right thigh without surrounding erythema.  Psych: Normal affect and mood  Patient declined chaperone  ASSESSMENT/PLAN:   Menopause Patient has struggled with postmenopausal symptoms for the last year.  Her most significant symptoms are the hot flashes and irritability.  At last visit she was increased to 20 mg of her Lexapro.  She feels no difference in this.  I encouraged her again to reach out to Dr. Hartford Poli for therapy given her panic attacks and mood symptoms that have not been improved with medication.  No SI or HI. -Continue Lexapro 20 mg daily -Therapy and counseling resources provided -Patient to reach out to Dr. Hartford Poli to schedule appointment -Again encouraged use of black cohosh and soy for hot flashes  Back  pain Patient with chronic back pain for which she has been taking chronic oxycodone.  Today we discussed the potential risks of long-term oxycodone use.  She states that she is still doing exercises from physical therapy.  She does not feel that she has a problem with opioids at this time.  I recommended that in the future, we revisit and think about potential dose decrease or other options to help.  She also noted that she had dropped her oxycodone pills at the pool the other day, is without them.  I discussed with her our early refill policy.  States that she has been doing okay without the Oxy at this time, trying lots of other conservative measures at home in the meantime. States she will wait until she is due for next refill.   Bump on skin Small.  Without any surrounding erythema or signs of infection at this point.  Recommended conservative measures with warm compresses at this time.     [name], [place]

## 2020-11-26 ENCOUNTER — Other Ambulatory Visit: Payer: Self-pay

## 2020-11-26 ENCOUNTER — Ambulatory Visit (INDEPENDENT_AMBULATORY_CARE_PROVIDER_SITE_OTHER): Payer: Medicaid Other | Admitting: Family Medicine

## 2020-11-26 DIAGNOSIS — M545 Low back pain, unspecified: Secondary | ICD-10-CM | POA: Diagnosis not present

## 2020-11-26 DIAGNOSIS — L989 Disorder of the skin and subcutaneous tissue, unspecified: Secondary | ICD-10-CM | POA: Diagnosis not present

## 2020-11-26 DIAGNOSIS — Z780 Asymptomatic menopausal state: Secondary | ICD-10-CM | POA: Diagnosis not present

## 2020-11-26 DIAGNOSIS — L02214 Cutaneous abscess of groin: Secondary | ICD-10-CM | POA: Diagnosis not present

## 2020-11-26 NOTE — Assessment & Plan Note (Signed)
Patient with chronic back pain for which she has been taking chronic oxycodone.  Today we discussed the potential risks of long-term oxycodone use.  She states that she is still doing exercises from physical therapy.  She does not feel that she has a problem with opioids at this time.  I recommended that in the future, we revisit and think about potential dose decrease or other options to help.  She also noted that she had dropped her oxycodone pills at the pool the other day, is without them.  I discussed with her our early refill policy.  States that she has been doing okay without the Oxy at this time, trying lots of other conservative measures at home in the meantime. States she will wait until she is due for next refill.

## 2020-11-26 NOTE — Assessment & Plan Note (Signed)
>>  ASSESSMENT AND PLAN FOR LOW BACK PAIN WRITTEN ON 11/26/2020  3:45 PM BY Sabino Dick, DO  Patient with chronic back pain for which she has been taking chronic oxycodone.  Today we discussed the potential risks of long-term oxycodone use.  She states that she is still doing exercises from physical therapy.  She does not feel that she has a problem with opioids at this time.  I recommended that in the future, we revisit and think about potential dose decrease or other options to help.  She also noted that she had dropped her oxycodone pills at the pool the other day, is without them.  I discussed with her our early refill policy.  States that she has been doing okay without the Oxy at this time, trying lots of other conservative measures at home in the meantime. States she will wait until she is due for next refill.

## 2020-11-26 NOTE — Assessment & Plan Note (Signed)
Small.  Without any surrounding erythema or signs of infection at this point.  Recommended conservative measures with warm compresses at this time.

## 2020-11-26 NOTE — Assessment & Plan Note (Signed)
Patient has struggled with postmenopausal symptoms for the last year.  Her most significant symptoms are the hot flashes and irritability.  At last visit she was increased to 20 mg of her Lexapro.  She feels no difference in this.  I encouraged her again to reach out to Dr. Hartford Poli for therapy given her panic attacks and mood symptoms that have not been improved with medication.  No SI or HI. -Continue Lexapro 20 mg daily -Therapy and counseling resources provided -Patient to reach out to Dr. Hartford Poli to schedule appointment -Again encouraged use of black cohosh and soy for hot flashes

## 2020-12-04 ENCOUNTER — Other Ambulatory Visit: Payer: Self-pay

## 2020-12-04 DIAGNOSIS — M545 Low back pain, unspecified: Secondary | ICD-10-CM

## 2020-12-04 MED ORDER — OXYCODONE HCL 5 MG PO TABS: 5.0000 mg | ORAL_TABLET | Freq: Two times a day (BID) | ORAL | 0 refills | Status: DC | PRN

## 2020-12-06 ENCOUNTER — Other Ambulatory Visit: Payer: Self-pay | Admitting: Student in an Organized Health Care Education/Training Program

## 2020-12-08 ENCOUNTER — Telehealth: Payer: Self-pay | Admitting: Family Medicine

## 2020-12-08 NOTE — Telephone Encounter (Signed)
..   Medicaid Managed Care   Unsuccessful Outreach Note  12/08/2020 Name: [name] MRN: [id number] DOB: 1965-06-16  Referred by: [name], DO Reason for referral : High Risk Managed Medicaid (I called the patient today to get her phone visit rescheduled with the [facility] LCSW. I left my name and number on her VM.)   A second unsuccessful telephone outreach was attempted today. The patient was referred to the case management team for assistance with care management and care coordination.   Follow Up Plan: The care management team will reach out to the patient again over the next 7 days.   [place]

## 2020-12-15 ENCOUNTER — Ambulatory Visit: Payer: Medicaid Other | Attending: Family Medicine | Admitting: Rehabilitation

## 2020-12-15 ENCOUNTER — Telehealth: Payer: Self-pay | Admitting: Family Medicine

## 2020-12-15 NOTE — Telephone Encounter (Signed)
.   Medicaid Managed Care   Unsuccessful Outreach Note  12/15/2020 Name: [name] MRN: [id number] DOB: [birth year]  Referred by: [name], DO Reason for referral : High Risk Managed Medicaid (I called the patient today to get her phone visit with the [facility] LCSW rescheduled. I left my name and number on her VM. This was my 3rd attempt.)   Third unsuccessful telephone outreach was attempted today. The patient was referred to the case management team for assistance with care management and care coordination. The patient's primary care provider has been notified of our unsuccessful attempts to make or maintain contact with the patient. The care management team is pleased to engage with this patient at any time in the future should he/she be interested in assistance from the care management team.   Follow Up Plan: We have been unable to make contact with the patient for follow up. The care management team is available to follow up with the patient after provider conversation with the patient regarding recommendation for care management engagement and subsequent re-referral to the care management team.   [place], [place]

## 2020-12-19 NOTE — Progress Notes (Signed)
No Show

## 2020-12-19 NOTE — Patient Instructions (Signed)
It was wonderful to see you today.  Please bring ALL of your medications with you to every visit.   Today we talked about:  -I have sent a prescription for the Shingles vaccine to your pharmacy.  -You received your Flu shot today. -Please reach out to Dr. Hartford Poli to schedule an appointment for therapy. -Continue your Lexapro    Thank you for choosing [facility].   Please call [phone] with any questions about today's appointment.  Please be sure to schedule follow up at the front  desk before you leave today.   [name], DO PGY-2 Family Medicine

## 2020-12-23 ENCOUNTER — Ambulatory Visit (INDEPENDENT_AMBULATORY_CARE_PROVIDER_SITE_OTHER): Payer: Medicaid Other | Admitting: Family Medicine

## 2020-12-23 DIAGNOSIS — Z91199 Patient's noncompliance with other medical treatment and regimen due to unspecified reason: Secondary | ICD-10-CM

## 2020-12-24 ENCOUNTER — Other Ambulatory Visit: Payer: Self-pay | Admitting: Family Medicine

## 2020-12-24 DIAGNOSIS — G629 Polyneuropathy, unspecified: Secondary | ICD-10-CM

## 2020-12-24 DIAGNOSIS — F419 Anxiety disorder, unspecified: Secondary | ICD-10-CM

## 2020-12-24 DIAGNOSIS — R110 Nausea: Secondary | ICD-10-CM

## 2020-12-24 MED ORDER — PREGABALIN 150 MG PO CAPS: 300.0000 mg | ORAL_CAPSULE | Freq: Two times a day (BID) | ORAL | 0 refills | Status: DC

## 2020-12-24 MED ORDER — ONDANSETRON HCL 4 MG PO TABS: ORAL_TABLET | ORAL | 0 refills | Status: DC

## 2021-01-03 IMAGING — MG DIGITAL SCREENING BILATERAL MAMMOGRAM WITH TOMO AND CAD
8 series · 8 of 24 positions shown · non-contrast
Comparison: Previous exam(s).

CLINICAL DATA: Screening.

EXAM:
DIGITAL SCREENING BILATERAL MAMMOGRAM WITH TOMO AND CAD

[R MLO synth-2D]
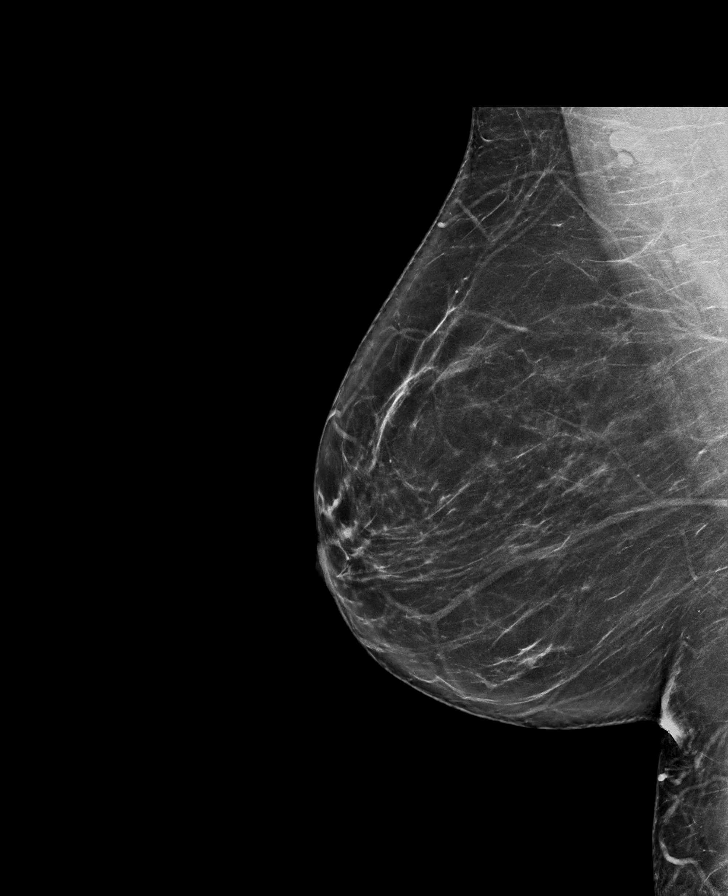

[R CC synth-2D]
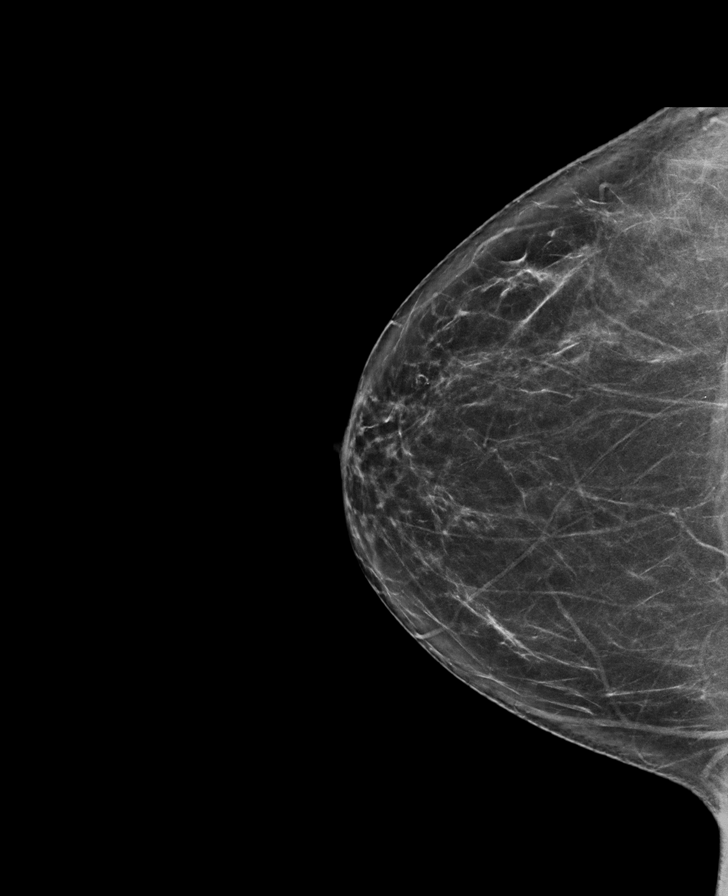

[L CC synth-2D]
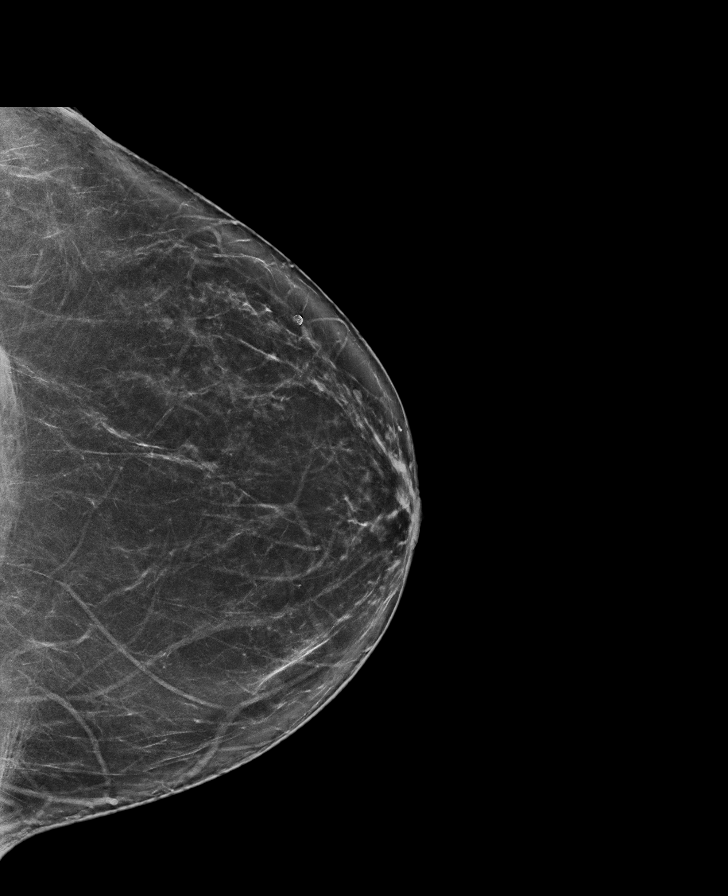

[L MLO synth-2D]
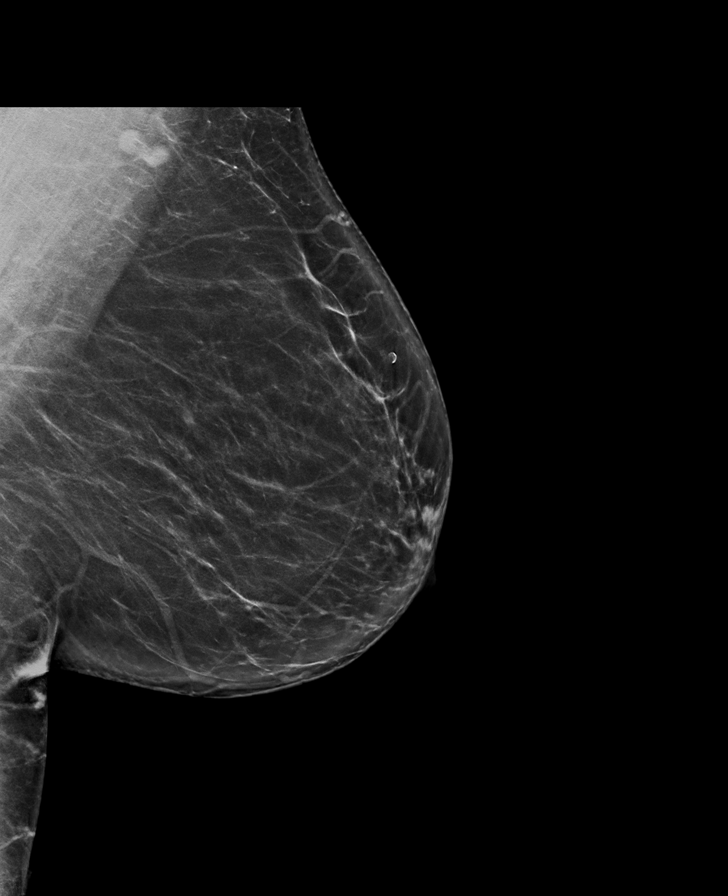

[R MLO tomo · tomo slice 41/81.0]
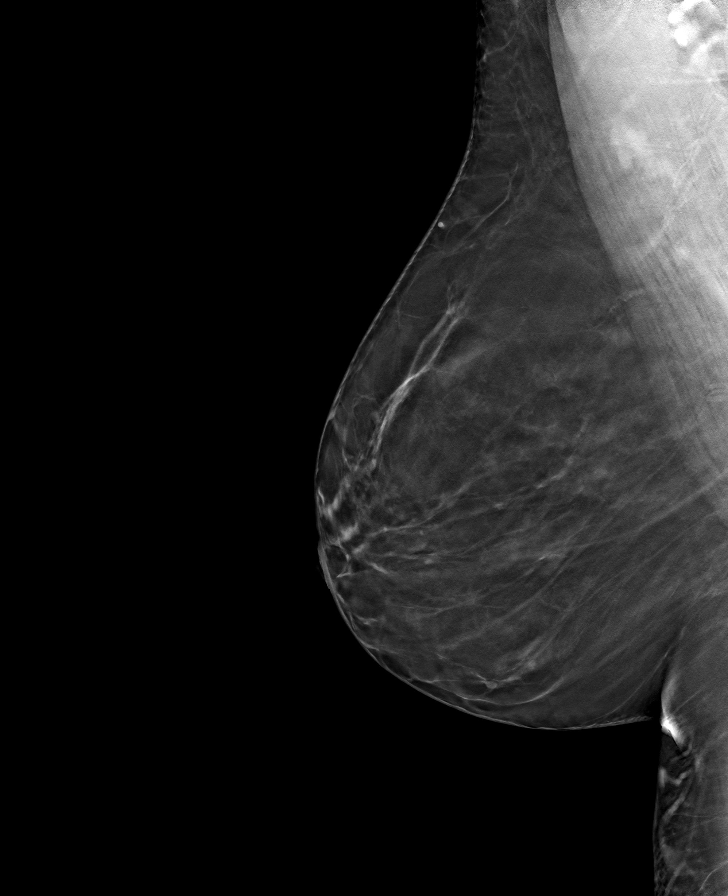

[L CC tomo · tomo slice 39/78.0]
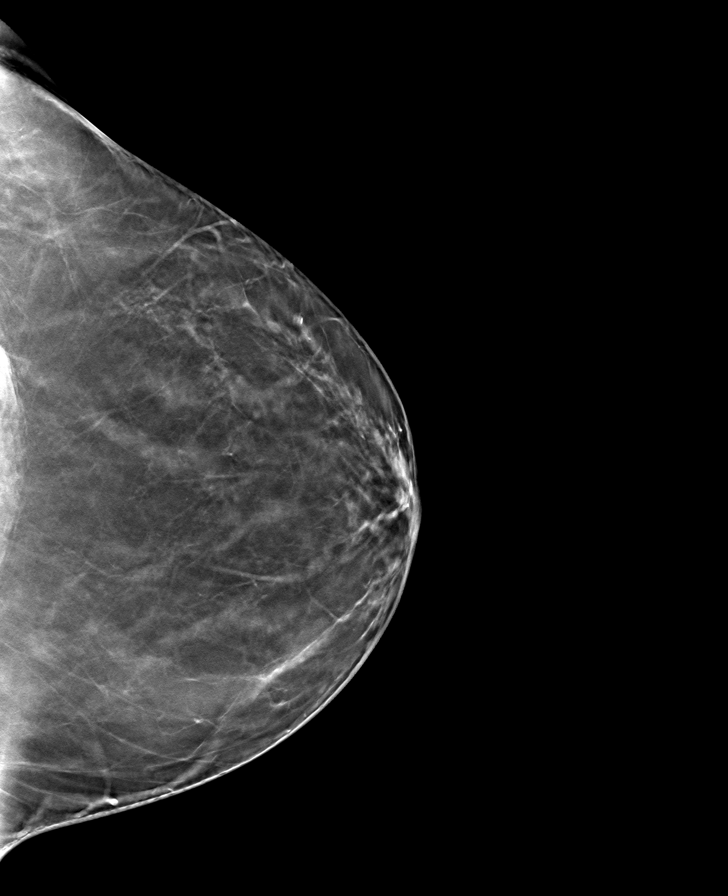

[L MLO tomo · tomo slice 43/84.0]
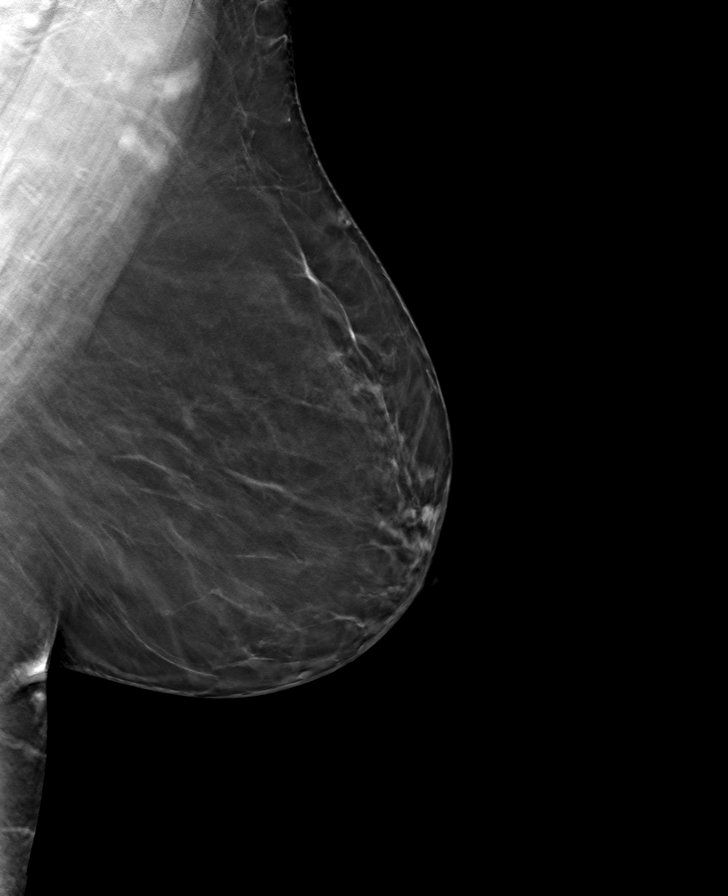

[R CC tomo · tomo slice 39/78.0]
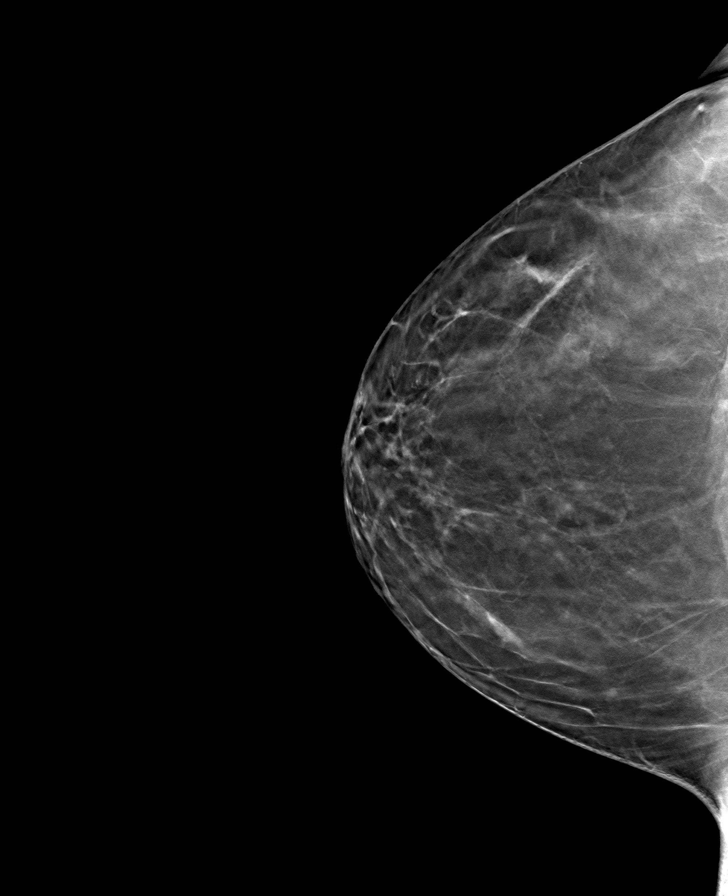

[8 of 24 positions shown; findings below may reference images not displayed]

ACR Breast Density Category b: There are scattered areas of
fibroglandular density.
FINDINGS: There are no findings suspicious for malignancy. Images were
processed with CAD.
IMPRESSION: No mammographic evidence of malignancy. A result letter of this
screening mammogram will be mailed directly to the patient.

RECOMMENDATION:
Screening mammogram in one year. (Code:[id number])

BI-RADS CATEGORY  1: Negative.

## 2021-01-05 ENCOUNTER — Other Ambulatory Visit: Payer: Self-pay

## 2021-01-05 DIAGNOSIS — M545 Low back pain, unspecified: Secondary | ICD-10-CM

## 2021-01-05 DIAGNOSIS — L301 Dyshidrosis [pompholyx]: Secondary | ICD-10-CM

## 2021-01-06 MED ORDER — CLOBETASOL PROPIONATE 0.05 % EX CREA: TOPICAL_CREAM | Freq: Two times a day (BID) | CUTANEOUS | 3 refills | Status: DC

## 2021-01-06 MED ORDER — OXYCODONE HCL 5 MG PO TABS: 5.0000 mg | ORAL_TABLET | Freq: Two times a day (BID) | ORAL | 0 refills | Status: DC | PRN

## 2021-01-07 ENCOUNTER — Telehealth: Payer: Self-pay

## 2021-01-07 NOTE — Telephone Encounter (Signed)
Patient calls nurse line reporting problems picking up oxycodone prescription. I called the pharmacy to see if PA is needed. Pharmacy reports Medicaid is rejecting the claim due to being locked in with another provider. Pharmacy is unable to tell me who the prescriber is. Pharmacy suggested the patient call her case worker to resolve the issue.   The patient has been updated.

## 2021-01-07 NOTE — Patient Instructions (Addendum)
It was wonderful to see you today.  Please bring ALL of your medications with you to every visit.   Today we talked about:  -I've sent a prescription for the Shingles vaccine to your pharmacy. Get this done at your earliest convenience. -I have refilled your Oxycodone. We discussed taking it only as needed. If you can go a day without it, that's great! -I recommend your Flu shot and Pneumococcal vaccines. -Check your blood pressure at home. Ideally, I would want your numbers under 120/80. It was a little elevated today.  -Please contact Dr. Hartford Poli to schedule an appointment. -Continue to take your Lexapro 20 mg daily.   Thank you for choosing [facility].   Please call [phone] with any questions about today's appointment.  Please be sure to schedule follow up at the front  desk before you leave today.   [name], DO PGY-2 Family Medicine

## 2021-01-07 NOTE — Progress Notes (Signed)
    SUBJECTIVE:   CHIEF COMPLAINT / HPI:   Follow Up Mood Concerns Patient presents today for follow up on mood concerns thought to be related to menopause. She continues on Lexapro 20 mg daily. She feels that it is helping her hot flashes, "I didn't have any last week". During last visit she noted that she was interested in restarting therapy with Dr. Hartford Poli. Per chart review, it appears that [facility] has unsuccessfully tried to connect with patient on multiple occasions to help her with resources. Patient notes that when they called her it came up as "scam likely".    Chronic Back Pain And Lumbar spondylosis that has been managed with chronic Oxycodone 5 mg BID. At previous appointment on 9/21 we discussed possible risks of long-term opioid treatment. She has been able to decreased dose in the past. She has ran out since the middle of last month- states that she has been taking some old medication that was the same strength. Describes it as a burning pain in her back.   Health Maintenance Due for Shingles, Pneumococcal and Influenza vaccinations.   PERTINENT  PMH / PSH:  Hypothyroidism, GERD, hyperlipidemia, lymphedema, chronic pain, anxiety depression  OBJECTIVE:   Blood pressure 134/90, pulse 72, weight 245 lb 12.8 oz (111.5 kg), SpO2 100 %.   General: NAD, pleasant, well dressed Cardiac: RRR, no murmurs. Respiratory: Breathing comfortably on room air, in no respiratory distress Back:  Limited AROM in flexion, extension, rotation and side bending. Pain on palpation to lumbar spine without deformities. Tenderness to paraspinal muscles in lower back bilaterally.  Extremities: no edema or cyanosis. Skin: warm and dry, no rashes noted Neuro: alert, no obvious focal deficits Psych: Normal affect and mood  ASSESSMENT/PLAN:   Back pain We continued discussion today about reducing amount of oxycodone taken and possibly reducing tablet amount to 45/month instead of 60. She preferred to go down  after the [place], so we can readdress again. She did have significantly reduced active ROM of her back and pain on palpation of her lumbar spine. I do believe she would benefit from continued physical therapy that she has declined this on multiple visits in the past.   Menopause She is currently doing well on Lexapro 20 mg daily.  She has noticed reduction in her hot flashes, though still has some mood irritability.  She has not yet made contact with Dr. Hartford Poli to set up therapy appointments.  She is still interested in doing so and I encouraged her to do this.  Healthcare maintenance Declines Flu vaccine today. Shingrix Rx sent to pharmacy. Would like to wait on Pneumococcal vaccine.     [name], [place]

## 2021-01-09 ENCOUNTER — Ambulatory Visit (INDEPENDENT_AMBULATORY_CARE_PROVIDER_SITE_OTHER): Payer: Medicaid Other | Admitting: Family Medicine

## 2021-01-09 ENCOUNTER — Encounter: Payer: Self-pay | Admitting: Family Medicine

## 2021-01-09 ENCOUNTER — Other Ambulatory Visit: Payer: Self-pay

## 2021-01-09 DIAGNOSIS — M545 Low back pain, unspecified: Secondary | ICD-10-CM

## 2021-01-09 DIAGNOSIS — Z780 Asymptomatic menopausal state: Secondary | ICD-10-CM

## 2021-01-09 DIAGNOSIS — Z0000 Encounter for general adult medical examination without abnormal findings: Secondary | ICD-10-CM | POA: Diagnosis not present

## 2021-01-09 MED ORDER — OXYCODONE HCL 5 MG PO TABS: 5.0000 mg | ORAL_TABLET | Freq: Two times a day (BID) | ORAL | 0 refills | Status: DC | PRN

## 2021-01-09 MED ORDER — ZOSTER VAC RECOMB ADJUVANTED 50 MCG/0.5ML IM SUSR: 0.5000 mL | Freq: Once | INTRAMUSCULAR | 0 refills | Status: AC

## 2021-01-09 NOTE — Assessment & Plan Note (Signed)
We continued discussion today about reducing amount of oxycodone taken and possibly reducing tablet amount to 45/month instead of 60. She preferred to go down after the [place], so we can readdress again. She did have significantly reduced active ROM of her back and pain on palpation of her lumbar spine. I do believe she would benefit from continued physical therapy that she has declined this on multiple visits in the past.

## 2021-01-09 NOTE — Assessment & Plan Note (Signed)
>>  ASSESSMENT AND PLAN FOR LOW BACK PAIN WRITTEN ON 01/09/2021  5:48 PM BY ESPINOZA, ALEJANDRA, DO  We continued discussion today about reducing amount of oxycodone taken and possibly reducing tablet amount to 45/month instead of 60. She preferred to go down after the [place], so we can readdress again. She did have significantly reduced active ROM of her back and pain on palpation of her lumbar spine. I do believe she would benefit from continued physical therapy that she has declined this on multiple visits in the past.

## 2021-01-09 NOTE — Assessment & Plan Note (Signed)
She is currently doing well on Lexapro 20 mg daily.  She has noticed reduction in her hot flashes, though still has some mood irritability.  She has not yet made contact with Dr. Hartford Poli to set up therapy appointments.  She is still interested in doing so and I encouraged her to do this.

## 2021-01-09 NOTE — Assessment & Plan Note (Signed)
Declines Flu vaccine today. Shingrix Rx sent to pharmacy. Would like to wait on Pneumococcal vaccine.

## 2021-02-03 ENCOUNTER — Other Ambulatory Visit: Payer: Self-pay

## 2021-02-03 MED ORDER — DEXLANSOPRAZOLE 60 MG PO CPDR: 60.0000 mg | DELAYED_RELEASE_CAPSULE | Freq: Every day | ORAL | 0 refills | Status: DC

## 2021-02-04 ENCOUNTER — Other Ambulatory Visit: Payer: Self-pay | Admitting: Student in an Organized Health Care Education/Training Program

## 2021-02-05 ENCOUNTER — Ambulatory Visit: Payer: Medicaid Other

## 2021-02-06 ENCOUNTER — Other Ambulatory Visit: Payer: Self-pay | Admitting: Family Medicine

## 2021-02-09 ENCOUNTER — Other Ambulatory Visit: Payer: Self-pay

## 2021-02-09 DIAGNOSIS — M545 Low back pain, unspecified: Secondary | ICD-10-CM

## 2021-02-09 MED ORDER — OXYCODONE HCL 5 MG PO TABS: 5.0000 mg | ORAL_TABLET | Freq: Two times a day (BID) | ORAL | 0 refills | Status: DC | PRN

## 2021-02-10 ENCOUNTER — Telehealth: Payer: Self-pay

## 2021-02-10 ENCOUNTER — Other Ambulatory Visit: Payer: Self-pay | Admitting: Family Medicine

## 2021-02-10 DIAGNOSIS — M545 Low back pain, unspecified: Secondary | ICD-10-CM

## 2021-02-10 MED ORDER — OXYCODONE HCL 5 MG PO TABS: 5.0000 mg | ORAL_TABLET | Freq: Two times a day (BID) | ORAL | 0 refills | Status: DC | PRN

## 2021-02-10 NOTE — Progress Notes (Deleted)
    SUBJECTIVE:   CHIEF COMPLAINT / HPI:   Routine Pain Follow-Up  [name] is a 55 y.o. female who presents today for routine follow up on her chronic pain. She is currently managed with 5 mg of Oxy IR q12h which she has been on for a while. We have been discussing taper over the last couple of visits. We have had ongoing discussions about the risks associated with the use of chronic opioid medications. I have recommended physical therapy on multiple occassions as well. She reports ***.   Mood Follow-Up Currently taking Lexapro 20 mg daily. She has not yet contacted Dr. Hartford Poli regarding scheduling an appointment for therapy.    PERTINENT  PMH / PSH:  Past Medical History:  Diagnosis Date   Allergy    Anxiety    Arthritis    knees,hands, hip  pt. Denies   Cellulitis    Colon polyps 2013   SESSIL SERRATED ADENOMA (ALL FRAGMENTS)   Complication of anesthesia    Lowered BP had to keep pt. overnight   COPD (chronic obstructive pulmonary disease) (HCC)    Depression    Fibroadenosis breast    GERD (gastroesophageal reflux disease)    does not take med   Heart murmur    as child  pt. denies   Hyperlipidemia    no meds - diet controlled   Hypothyroidism    Lymph edema    both legs and feet   Migraine    Plantar fasciitis    Syphilis Treated in early 2012 during hospitalization   Uterine fibroid    Varicose veins      OBJECTIVE:   There were no vitals taken for this visit. ***  General: NAD, pleasant, able to participate in exam Cardiac: RRR, no murmurs. Respiratory: CTAB, normal effort, No wheezes, rales or rhonchi Abdomen: Bowel sounds present, nontender, nondistended, no hepatosplenomegaly. Extremities: no edema or cyanosis. Skin: warm and dry, no rashes noted Neuro: alert, no obvious focal deficits Psych: Normal affect and mood  ASSESSMENT/PLAN:   No problem-specific Assessment & Plan notes found for this encounter.     [name], [place]

## 2021-02-10 NOTE — Progress Notes (Signed)
Resent prescription of Oxy as transaction had failed for the first. Confirmed with order and PDMP.

## 2021-02-10 NOTE — Telephone Encounter (Signed)
Pt informed. [name] Hank Walling, CMA  

## 2021-02-10 NOTE — Patient Instructions (Incomplete)
It was wonderful to see you today.  Please bring ALL of your medications with you to every visit.   Today we talked about:  **   Thank you for choosing [facility].   Please call [phone] with any questions about today's appointment.  Please be sure to schedule follow up at the front  desk before you leave today.   [name], DO PGY-2 Family Medicine    Managing Pain Without Opioids Opioids are strong medicines used to treat moderate to severe pain. For some people, especially those who have long-term (chronic) pain, opioids may not be the best choice for pain management due to: Side effects like nausea, constipation, and sleepiness. The risk of addiction (opioid use disorder). The longer you take opioids, the greater your risk of addiction. Pain that lasts for more than 3 months is called chronic pain. Managing chronic pain usually requires more than one approach and is often provided by a team of health care providers working together (multidisciplinary approach). Pain management may be done at a pain management center or pain clinic. How to manage pain without the use of opioids Use non-opioid medicines Non-opioid medicines for pain may include: Over-the-counter or prescription non-steroidal anti-inflammatory drugs (NSAIDs). These may be the first medicines used for pain. They work well for muscle and bone pain, and they reduce swelling. Acetaminophen. This over-the-counter medicine may work well for milder pain but not swelling. Antidepressants. These may be used to treat chronic pain. A certain type of antidepressant (tricyclics) is often used. These medicines are given in lower doses for pain than when used for depression. Anticonvulsants. These are usually used to treat seizures but may also reduce nerve (neuropathic) pain. Muscle relaxants. These relieve pain caused by sudden muscle tightening (spasms). You may also use a pain medicine that is  applied to the skin as a patch, cream, or gel (topical analgesic), such as a numbing medicine. These may cause fewer side effects than medicines taken by mouth. Do certain therapies as directed Some therapies can help with pain management. They include: Physical therapy. You will do exercises to gain strength and flexibility. A physical therapist may teach you exercises to move and stretch parts of your body that are weak, stiff, or painful. You can learn these exercises at physical therapy visits and practice them at home. Physical therapy may also involve: Massage. Heat wraps or applying heat or cold to affected areas. Electrical signals that interrupt pain signals (transcutaneous electrical nerve stimulation, TENS). Weak lasers that reduce pain and swelling (low-level laser therapy). Signals from your body that help you learn to regulate pain (biofeedback). Occupational therapy. This helps you to learn ways to function at home and work with less pain. Recreational therapy. This involves trying new activities or hobbies, such as a physical activity or drawing. Mental health therapy, including: Cognitive behavioral therapy (CBT). This helps you learn coping skills for dealing with pain. Acceptance and commitment therapy (ACT) to change the way you think and react to pain. Relaxation therapies, including muscle relaxation exercises and mindfulness-based stress reduction. Pain management counseling. This may be individual, family, or group counseling.  Receive medical treatments Medical treatments for pain management include: Nerve block injections. These may include a pain blocker and anti-inflammatory medicines. You may have injections: Near the spine to relieve chronic back or neck pain. Into joints to relieve back or joint pain. Into nerve areas that supply a painful area to relieve body pain. Into muscles (trigger point injections) to  relieve some painful muscle conditions. A medical  device placed near your spine to help block pain signals and relieve nerve pain or chronic back pain (spinal cord stimulation device). Acupuncture. Follow these instructions at home Medicines Take over-the-counter and prescription medicines only as told by your health care provider. If you are taking pain medicine, ask your health care providers about possible side effects to watch out for. Do not drive or use heavy machinery while taking prescription opioid pain medicine. Lifestyle  Do not use drugs or alcohol to reduce pain. If you drink alcohol, limit how much you have to: 0-1 drink a day for women who are not pregnant. 0-2 drinks a day for men. Know how much alcohol is in a drink. In the [place] one drink equals one 12 oz bottle of beer (355 mL), one 5 oz glass of wine (148 mL), or one 1 oz glass of hard liquor (44 mL). Do not use any products that contain nicotine or tobacco. These products include cigarettes, chewing tobacco, and vaping devices, such as e-cigarettes. If you need help quitting, ask your health care provider. Eat a healthy diet and maintain a healthy weight. Poor diet and excess weight may make pain worse. Eat foods that are high in fiber. These include fresh fruits and vegetables, whole grains, and beans. Limit foods that are high in fat and processed sugars, such as fried and sweet foods. Exercise regularly. Exercise lowers stress and may help relieve pain. Ask your health care provider what activities and exercises are safe for you. If your health care provider approves, join an exercise class that combines movement and stress reduction. Examples include yoga and tai chi. Get enough sleep. Lack of sleep may make pain worse. Lower stress as much as possible. Practice stress reduction techniques as told by your therapist. General instructions Work with all your pain management providers to find the treatments that work best for you. You are an important member of your pain  management team. There are many things you can do to reduce pain on your own. Consider joining an online or in-person support group for people who have chronic pain. Keep all follow-up visits. This is important. Where to find more information You can find more information about managing pain without opioids from: American Academy of Pain Medicine: painmed.[place] for Chronic Pain: instituteforchronicpain.org American Chronic Pain Association: theacpa.org Contact a health care provider if: You have side effects from pain medicine. Your pain gets worse or does not get better with treatments or home therapy. You are struggling with anxiety or depression. Summary Many types of pain can be managed without opioids. Chronic pain may respond better to pain management without opioids. Pain is best managed when you and a team of health care providers work together. Pain management without opioids may include non-opioid medicines, medical treatments, physical therapy, mental health therapy, and lifestyle changes. Tell your health care providers if your pain gets worse or is not being managed well enough. This information is not intended to replace advice given to you by your health care provider. Make sure you discuss any questions you have with your health care provider. Document Revised: 06/04/2020 Document Reviewed: 06/04/2020 Elsevier Patient Education  [place].

## 2021-02-10 NOTE — Telephone Encounter (Signed)
Received fax from pharmacy, PA needed on oxycodone.  Clinical questions submitted via Cover My Meds.  Waiting on response, could take up to 72 hours.  Cover My Meds info: Key: Tereso Newcomer, RN

## 2021-02-10 NOTE — Telephone Encounter (Signed)
Pt calling to ask that we resend the Rx for the oxycodone. The pharmacy did not receive it. In looking that the Rx it looks like the transmission failed. Ottis Stain, CMA  oxyCODONE (OXY IR/ROXICODONE) 5 MG immediate release tablet 60 tablet 0 02/09/2021 03/11/2021   Sig - Route: Take 1 tablet (5 mg total) by mouth every 12 (twelve) hours as needed for severe pain. - Oral   Sent to pharmacy as: oxyCODONE (OXY IR/ROXICODONE) 5 MG immediate release tablet   Earliest Fill Date: 02/09/2021   E-Prescribing Status: Transmission to pharmacy failed (02/09/2021  1:13 PM EST)

## 2021-02-11 ENCOUNTER — Ambulatory Visit: Payer: Medicaid Other | Admitting: Family Medicine

## 2021-02-11 NOTE — Telephone Encounter (Signed)
Received approval from 02/11/21-08/08/21.  Pharmacy called with approval. Patient called with update.   Talbot Grumbling, RN

## 2021-02-12 ENCOUNTER — Other Ambulatory Visit: Payer: Self-pay | Admitting: Family Medicine

## 2021-02-22 NOTE — Progress Notes (Deleted)
° ° °  SUBJECTIVE:   CHIEF COMPLAINT / HPI:   Routine Pain Follow-Up  [name] is a 55 y.o. female who presents today for routine follow up on her chronic pain. She is currently managed with 5 mg of Oxy IR q12h which she has been on for a while. We have been discussing taper over the last couple of visits. We have had ongoing discussions about the risks associated with the use of chronic opioid medications. I have recommended physical therapy on multiple occassions as well. She reports ***.   Mood Follow-Up Currently taking Lexapro 20 mg daily. She has not yet contacted Dr. Hartford Poli regarding scheduling an appointment for therapy.    PERTINENT  PMH / PSH:  Past Medical History:  Diagnosis Date   Allergy    Anxiety    Arthritis    knees,hands, hip  pt. Denies   Cellulitis    Colon polyps 2013   SESSIL SERRATED ADENOMA (ALL FRAGMENTS)   Complication of anesthesia    Lowered BP had to keep pt. overnight   COPD (chronic obstructive pulmonary disease) (HCC)    Depression    Fibroadenosis breast    GERD (gastroesophageal reflux disease)    does not take med   Heart murmur    as child  pt. denies   Hyperlipidemia    no meds - diet controlled   Hypothyroidism    Lymph edema    both legs and feet   Migraine    Plantar fasciitis    Syphilis Treated in early 2012 during hospitalization   Uterine fibroid    Varicose veins      OBJECTIVE:   There were no vitals taken for this visit. ***  General: NAD, pleasant, able to participate in exam Cardiac: RRR, no murmurs. Respiratory: CTAB, normal effort, No wheezes, rales or rhonchi Abdomen: Bowel sounds present, nontender, nondistended, no hepatosplenomegaly. Extremities: no edema or cyanosis. Skin: warm and dry, no rashes noted Neuro: alert, no obvious focal deficits Psych: Normal affect and mood  ASSESSMENT/PLAN:   No problem-specific Assessment & Plan notes found for this encounter.     [name], [place]

## 2021-02-22 NOTE — Patient Instructions (Incomplete)
It was wonderful to see you today.  Please bring ALL of your medications with you to every visit.   Today we talked about:  -We discussed the risks associated with ongoing chronic opioid use. Our goal will be to start to slowly taper you. Physical therapy is important and I would encourage you to go.  -   Thank you for choosing [facility].   Please call [phone] with any questions about today's appointment.  Please be sure to schedule follow up at the front  desk before you leave today.   [name], DO PGY-2 Family Medicine

## 2021-02-23 ENCOUNTER — Telehealth: Payer: Self-pay | Admitting: Family Medicine

## 2021-02-23 ENCOUNTER — Ambulatory Visit: Payer: Medicaid Other | Admitting: Family Medicine

## 2021-02-23 NOTE — Telephone Encounter (Signed)
Patient called to reschedule the appointment she missed this morning. She would like for Dr. Valrie Hart to call her to discuss something personal.   The best call back number is [phone]

## 2021-02-24 NOTE — Telephone Encounter (Signed)
Received message that patient wanted to discuss something personal with me. I have attempted to contact her at [phone] four times today unsuccessfully.

## 2021-02-28 NOTE — Patient Instructions (Signed)
It was wonderful to see you today. ? ?Please bring ALL of your medications with you to every visit.  ? ?Today we talked about: ? ?** ? ? ?Thank you for choosing [facility].  ? ?Please call [phone] with any questions about today's appointment. ? ?Please be sure to schedule follow up at the front  desk before you leave today.  ? ?Elhadji Pecore, DO ?PGY-2 Family Medicine   ?

## 2021-02-28 NOTE — Progress Notes (Signed)
[facility] Visit Patient consented to have virtual visit and was identified by name and date of birth. Method of visit: Telephone  Encounter participants: Patient: [name] - located at home Provider: [name] - located at [facility] clinic  Chief Complaint: Chronic Pain follow-up  HPI:  Routine Pain Follow-Up  [name] is a 55 y.o. female who presents today for routine follow up on her chronic pain. She is currently managed with 5 mg of Oxy IR q12h which she has been on for a while. We have been discussing taper over the last couple of visits. We have had ongoing discussions about the risks associated with the use of chronic opioid medications. I have recommended physical therapy on multiple occassions as well. She reports that she is amenable to gradual decrease by 20%.    Cold Symptoms Symptoms started on 12/23. No fevers. Mostly congestion and cough. States that she is getting better.   Mood Follow-Up Currently taking Lexapro 20 mg daily. She has not yet contacted Dr. Hartford Poli regarding scheduling an appointment for therapy. States that she had a "nervous breakdown" recently. I had called her at the time to discuss this, patient reports seeing my call but states she was not in the mood to talk, "I didn't want to talk to anyone". States her Moms anniversary was on the 16th of this month. She passed in 2016.   ROS: per HPI  Pertinent PMHx:  GERD, lumbar spondylosis, anxiety, depression, back pain, chronic pain  Exam:  Respiratory: Sounds congested, breathing comfortably, in no distress   Assessment/Plan: Chronic pain Has been on 5 mg of Oxy IR twice daily chronically.  She is amenable today to decrease by 20% with goal of weaning off oxy.  Next refill in January will be for 48 tablets.  We also discussed UDS at follow-up and that next appointment will need to be in person.  Patient is amenable to this.  Recommended bowel regimen to  keep stools regular- states she is taking prune juice which works well for her. She is storing medication safely. She feels strongly against being referred to pain management.   URI (upper respiratory infection) Sounded congested today. No fevers. States symptoms have been improving.  -Continue conservative measures  Menopause Still having mood swings and hot flashes from menopause. On Lexapro 20 mg for mood. Has been taking regularly. Reports recent anxiety and depression with anniversary of her moms passing. Is doing better at this time.  -Encouraged therapy -Continue Lexapro    Time spent during visit with patient: 15 minutes.

## 2021-03-04 ENCOUNTER — Other Ambulatory Visit: Payer: Self-pay

## 2021-03-04 ENCOUNTER — Other Ambulatory Visit: Payer: Self-pay | Admitting: Family Medicine

## 2021-03-04 ENCOUNTER — Telehealth (INDEPENDENT_AMBULATORY_CARE_PROVIDER_SITE_OTHER): Payer: Medicaid Other | Admitting: Family Medicine

## 2021-03-04 DIAGNOSIS — J069 Acute upper respiratory infection, unspecified: Secondary | ICD-10-CM | POA: Diagnosis not present

## 2021-03-04 DIAGNOSIS — M545 Low back pain, unspecified: Secondary | ICD-10-CM

## 2021-03-04 DIAGNOSIS — G8929 Other chronic pain: Secondary | ICD-10-CM | POA: Diagnosis not present

## 2021-03-04 DIAGNOSIS — Z780 Asymptomatic menopausal state: Secondary | ICD-10-CM

## 2021-03-04 MED ORDER — OXYCODONE HCL 5 MG PO TABS: 5.0000 mg | ORAL_TABLET | Freq: Two times a day (BID) | ORAL | 0 refills | Status: DC | PRN

## 2021-03-04 NOTE — Assessment & Plan Note (Signed)
Still having mood swings and hot flashes from menopause. On Lexapro 20 mg for mood. Has been taking regularly. Reports recent anxiety and depression with anniversary of her moms passing. Is doing better at this time.  -Encouraged therapy -Continue Lexapro

## 2021-03-04 NOTE — Assessment & Plan Note (Signed)
Sounded congested today. No fevers. States symptoms have been improving.  -Continue conservative measures

## 2021-03-04 NOTE — Progress Notes (Signed)
Virtual televisit with patient today.  She was amenable to 20% decrease in oxy with goal to continue to continue to wean. Refill sent for 48 tablets (previously 60) with pharmacy instructions to not fill prior to March 14, 2021.

## 2021-03-04 NOTE — Assessment & Plan Note (Signed)
Has been on 5 mg of Oxy IR twice daily chronically.  She is amenable today to decrease by 20% with goal of weaning off oxy.  Next refill in January will be for 48 tablets.  We also discussed UDS at follow-up and that next appointment will need to be in person.  Patient is amenable to this.  Recommended bowel regimen to keep stools regular- states she is taking prune juice which works well for her. She is storing medication safely. She feels strongly against being referred to pain management.

## 2021-03-06 ENCOUNTER — Other Ambulatory Visit: Payer: Self-pay | Admitting: Family Medicine

## 2021-03-13 ENCOUNTER — Other Ambulatory Visit: Payer: Self-pay | Admitting: Student in an Organized Health Care Education/Training Program

## 2021-03-21 ENCOUNTER — Other Ambulatory Visit: Payer: Self-pay | Admitting: Family Medicine

## 2021-03-21 DIAGNOSIS — F419 Anxiety disorder, unspecified: Secondary | ICD-10-CM

## 2021-03-24 ENCOUNTER — Other Ambulatory Visit: Payer: Self-pay

## 2021-03-24 ENCOUNTER — Ambulatory Visit: Payer: Medicaid Other | Admitting: Psychology

## 2021-03-24 ENCOUNTER — Ambulatory Visit: Payer: Medicaid Other | Admitting: Pharmacist

## 2021-03-24 ENCOUNTER — Telehealth: Payer: Self-pay | Admitting: Family Medicine

## 2021-03-24 ENCOUNTER — Other Ambulatory Visit: Payer: Self-pay | Admitting: Family Medicine

## 2021-03-24 ENCOUNTER — Ambulatory Visit (INDEPENDENT_AMBULATORY_CARE_PROVIDER_SITE_OTHER): Payer: Medicaid Other | Admitting: Psychology

## 2021-03-24 DIAGNOSIS — F32A Depression, unspecified: Secondary | ICD-10-CM | POA: Diagnosis not present

## 2021-03-24 DIAGNOSIS — F419 Anxiety disorder, unspecified: Secondary | ICD-10-CM | POA: Diagnosis not present

## 2021-03-24 DIAGNOSIS — K219 Gastro-esophageal reflux disease without esophagitis: Secondary | ICD-10-CM

## 2021-03-24 MED ORDER — DEXLANSOPRAZOLE 60 MG PO CPDR
60.0000 mg | DELAYED_RELEASE_CAPSULE | Freq: Every day | ORAL | 3 refills | Status: DC
Start: 2021-03-24 — End: 2021-04-28

## 2021-03-24 NOTE — BH Specialist Note (Signed)
Integrated Behavioral Health Follow Up In-Person Visit  MRN: [id number] Name: [name]  Number of [facility] Clinician visits: 1/6 Session Start time: 0355  Session End time: 9741 Total time: 15 minutes  Types of Service: Individual psychotherapy  Subjective: [name] is a 56 y.o. female  Patient was referred by Dr. Nita Sells for anxiety/depression. Patient reports the following symptoms/concerns: Pt came 30 min late to appt so visit was abbreviated. Pt shared her mood has been low due to her mother's anniversary of death.  Pt shared she would like a journal to write her thoughts in.  Duration of problem: not known; Severity of problem: moderate  Objective: Mood: Euthymic and Affect: Appropriate Risk of harm to self or others: Suicidal ideation: passive SI; no plan no intent ; 988 number given  Life Context: Family and Social: parent loss processing   Patient and/or Family's Strengths/Protective Factors: Social and Emotional competence  Goals Addressed: Patient will:  Reduce symptoms of: depression : feeling overwhelmed; phq 19  Increase knowledge and/or ability of: coping skills : journaling; processing emotions    Progress towards Goals: Ongoing  Interventions: Interventions utilized:  Veterinary surgeon and Supportive Counseling Standardized Assessments completed: PHQ 9  Patient and/or Family Response: Engaged in treatment planning   Patient Centered Plan: Patient is on the following Treatment Plan(s): anxiety/depression treatment plan Assessment: Patient currently experiencing depression syx.   Patient may benefit from CBT and processing emotions.  Plan: Follow up with behavioral health clinician on : 1 month per pt request  Behavioral recommendations: journaling Referral(s): [facility] (In Clinic)   Erlinda Hong, PhD., LMFT

## 2021-03-24 NOTE — Telephone Encounter (Signed)
Refilled

## 2021-03-24 NOTE — Telephone Encounter (Signed)
Patient came in stating needs a refill on dexilant, she has been out for about a week.

## 2021-03-31 NOTE — Patient Instructions (Incomplete)
It was wonderful to see you today. ? ?Please bring ALL of your medications with you to every visit.  ? ?Today we talked about: ? ?** ? ? ?Thank you for choosing [facility].  ? ?Please call [phone] with any questions about today's appointment. ? ?Please be sure to schedule follow up at the front  desk before you leave today.  ? ?Daesean Lazarz, DO ?PGY-2 Family Medicine   ?

## 2021-03-31 NOTE — Progress Notes (Deleted)
° ° °  SUBJECTIVE:   CHIEF COMPLAINT / HPI:   Chronic Pain   Medication Management  [name] is a 56 y.o. female who presents to the [facility] clinic today for her chronic pain and medication management. She last followed up with me on 12/28 virtually.  Over the last few months we have had ongoing discussions regarding decreasing her chronic opioid prescription with the goal of coming off opioid medications completely.  Patient has been amenable to this.  At last appointment I had decreased her previous prescription of [place] IR 5 mg twice daily by 20%, only filled 48 tablets in January. She was advised of need for drug screen at follow up. Since I last saw her in December, she came up to me in clinic after her appointment with Dr. Hartford [name] on 1/17 and told me that she had completely stopped taking her opioid medication after our conversation.  States that she was feeling fine without her medications.  States that she flushed them down the toilet. Also taking Lyrica 150 mg twice daily, Lexapro 10 mg daily, Flexeril 5 mg at bedtime as needed  Tobacco Use Disorder Patient reports increased stress and anxiety around the 16th of every month as this is the anniversary of her mother's passing.  She reports smoking *** cigarettes daily. Is interested in cessation.   PERTINENT  PMH / PSH: GERD, hypothyroidism, hyperlipidemia, anxiety and depression, episodic mood disorder   OBJECTIVE:   There were no vitals taken for this visit. ***  General: NAD, pleasant, able to participate in exam Cardiac: RRR, no murmurs. Respiratory: CTAB, normal effort, No wheezes, rales or rhonchi Abdomen: Bowel sounds present, nontender, nondistended, no hepatosplenomegaly. Extremities: no edema or cyanosis. Skin: warm and dry, no rashes noted Neuro: alert, no obvious focal deficits Psych: Normal affect and mood  ASSESSMENT/PLAN:   No problem-specific Assessment & Plan notes found for this encounter.     [name],  [name]

## 2021-04-01 ENCOUNTER — Ambulatory Visit: Payer: Medicaid Other | Admitting: Family Medicine

## 2021-04-14 ENCOUNTER — Encounter: Payer: Self-pay | Admitting: Pharmacist

## 2021-04-14 ENCOUNTER — Ambulatory Visit (INDEPENDENT_AMBULATORY_CARE_PROVIDER_SITE_OTHER): Payer: Medicaid Other | Admitting: Pharmacist

## 2021-04-14 ENCOUNTER — Other Ambulatory Visit: Payer: Self-pay

## 2021-04-14 DIAGNOSIS — Z87891 Personal history of nicotine dependence: Secondary | ICD-10-CM | POA: Diagnosis not present

## 2021-04-14 MED ORDER — VARENICLINE TARTRATE 0.5 MG PO TABS: 0.5000 mg | ORAL_TABLET | Freq: Two times a day (BID) | ORAL | 5 refills | Status: DC

## 2021-04-14 NOTE — Assessment & Plan Note (Signed)
Tobacco use disorder with severe nicotine dependence of 34 years duration in a patient who is good candidate for success because of motivators such as her grandkids and her COPD diagnosis.    -Initiated varenicline 0.5 mg by mouth once daily with food x3 days, then 0.5 mg by mouth twice daily with food x4 day. Patient counseled on purpose, proper use, and potential adverse effects, including GI upset. -Provided information on 1 800-QUIT NOW support program.  -Next office visit, consider spirometry for COPD and lung age estimation.

## 2021-04-14 NOTE — Progress Notes (Signed)
SUBJECTIVE:   CHIEF COMPLAINT / HPI:   Chronic Pain   Medication Management  [name] is a 56 y.o. female who presents to the [facility] clinic today for her chronic pain and medication management. She last followed up with me on 12/28 virtually.  Over the last few months we have had ongoing discussions regarding decreasing her chronic opioid prescription with the goal of coming off opioid medications completely.  Patient has been amenable to this.  At last appointment I had decreased her previous prescription of Oxy IR 5 mg twice daily by 20%, only filled 48 tablets in January. She was advised of need for drug screen at follow up. Since I last saw her in December, she came up to me in clinic after her appointment with Dr. Hartford Poli on 1/17 and told me that she had completely stopped taking her opioid medication after our conversation.  States that she was feeling fine without her medications.  States that she flushed them down the toilet. Also taking Lyrica 150 mg twice daily, Lexapro 10 mg daily, Flexeril 5 mg at bedtime as needed. She is managing her back pain without opioids at this time and feels fairly well. She is excited about getting back on track and doing things to improve her health.   Tobacco Use Disorder Patient reports increased stress and anxiety around the 16th of every month as this is the anniversary of her mother's passing.  She reports smoking cigarettes daily but is making a conscious effort to cut back. Is interested in cessation and recently met with Dr. Valentina Lucks on 2/7 and was initiated on varenicline. She states she had 2 cigarettes yesterday, which is an improvement. She is excited about being on Chantix.   Wart Removal Reports that she has a wart on the first toe of her right foot that has been present for a long time. She believes that she previously had cryotherapy to the one on her 3rd toe, then they "shaved it off". She would like this done today.   Head Pain Reports that  about a year ago she hit her head on her car trunk. Every since then she has had some pain to her head. She never received any imaging. Feels that she feels a "tingling" sensation to the top of her head- she would like to be referred to Neurology. It is not causing her headaches, focal weakness or vision changes.   PERTINENT  PMH / PSH: GERD, hypothyroidism, hyperlipidemia, anxiety and depression, episodic mood disorder   OBJECTIVE:   BP 131/83    Pulse 72    Wt 252 lb (114.3 kg)    SpO2 100%    BMI 37.21 kg/m    General: NAD, pleasant, able to participate in exam HEENT: Normocephalic, mild tenderness to palpation of top of scalp Cardiac: RRR, no murmurs. Respiratory: CTAB, normal effort, No wheezes, rales or rhonchi Abdomen: Bowel sounds present, nontender, nondistended, no hepatosplenomegaly. Extremities: Lymphedema b/l legs Skin: Toe nail thickening, discoloration to 5th nail. Has areas of dry and chronic skin changes on toes, in particular the 2nd, 3rd and in the crease of first toe.  Neuro:  Cranial Nerves: II: Visual Fields are full. PERRL.  III,IV, VI: EOMI without ptosis or diplopia.  V: Facial sensation is symmetric to temperature VII: Facial movement is symmetric.  VIII: hearing is intact to voice X: Palat elevates symmetrically XI: Shoulder shrug is symmetric. XII: tongue is midline without atrophy or fasciculations.  Motor: Tone is normal.  Bulk is normal. 5/5 strength was present in all four extremities.  Sensory: Sensation is symmetric to light touch and temperature in the arms and legs. Deep Tendon Reflexes: 2+ and symmetric in the biceps and patellae.  Cerebellar: Alternating hand movements and HKS are intact bilaterally  Psych: Normal affect and mood     Depression screen [facility] 2/9 04/17/2021 03/24/2021 01/09/2021  Decreased Interest 3 3 3   Down, Depressed, Hopeless 3 3 3   PHQ - 2 Score 6 6 6   Altered sleeping 3 3 3   Tired, decreased energy 3 3 3   Change in  appetite 3 3 3   Feeling bad or failure about yourself  1 1 1   Trouble concentrating 3 3 3   Moving slowly or fidgety/restless 3 0 0  Suicidal thoughts 0 0 0  PHQ-9 Score 22 19 19   Difficult doing work/chores - Extremely dIfficult -  Some recent data might be hidden      ASSESSMENT/PLAN:   Head pain Appears superficial on top of scalp, without deformities. Neuro exam normal and no concern for acute intracranial process at this time. Recommended topical analgesics for pain relief. Do not feel that referral to Neurology is indicated at this time. Patient in agreement with plan.  Lymphedema Appears that she has chronic skin findings with long-standing lymphedema. Her skin changes do not appear consistent with warts and it was not recommended to apply cryotherapy due to concerns with healing. Recommend referral to [facility] referral today  Tobacco abuse Congratulated on efforts to cut back and praised her on initiating appointment with Dr. Valentina Lucks. Patient appears very motivated and I believe she will be successful. -Continue Chantix -F/u with Dr. Valentina Lucks as scheduled  Episodic mood disorder ([place]) On Lexapro, Hydroxyzine. She states mood is stable. No SI. PHQ-9 is elevated today. States she has an upcoming appointment with Dr. Hartford Poli and plans to open up about past trauma. I praised her on her efforts. She would like to have monthly therapy appointments. -Continue medications as prescribed -Continue therapy  Chronic pain Praised patient on coming off of opioids. She seems to be managing well without Oxy.  -Continue current non-narcotic pain regimen -Encourage physical activity  Healthcare maintenance Prescription for Shingrix sent to pharmacy.     [name], [place]

## 2021-04-14 NOTE — Progress Notes (Signed)
Noted and agree. 

## 2021-04-14 NOTE — Progress Notes (Signed)
° °  S:  Patient arrives in good spirits. Patient arrives for evaluation/assistance with tobacco dependence.   Patient was referred and last seen by Primary Care Provider, Dr. Nita Sells, .   Age when started using tobacco on a daily basis age 56. Brand smoked [place]. Number of cigarettes/day: 1 pack a day.  Wants to quit smoking now as she is ready to quit. She prefers patches over Chantix. She felt her previous experience with Chantix was not beneficial as she would often forget to take it.   Estimated nicotine intake per day > 20 mg.   Denies waking to smoke   Most recent quit attempt 2014 Longest time ever been tobacco free 2 weeks.  Medications used in past cessation efforts include: Chantix - successful, quit for 2 weeks.  Rates IMPORTANCE of quitting tobacco on 1-10 scale of 10. Rates CONFIDENCE of quitting tobacco on 1-10 scale of 10.  Most common triggers to use tobacco include; stress and anxiety. Smokes after meals. Other smokers (cousin) are also pressures to smoke.  Motivation to quit: diagnosed with COPD, wants to prevent decline. Does not want to expose her grandchildren to smoke and wants to be around to see them.  A/P: Tobacco use disorder with severe nicotine dependence of 34 years duration in a patient who is good candidate for success because of motivators such as her grandkids and her COPD diagnosis.    -Initiated varenicline 0.5 mg by mouth once daily with food x3 days, then 0.5 mg by mouth twice daily with food x4 day. Patient counseled on purpose, proper use, and potential adverse effects, including GI upset. -Provided information on 1 800-QUIT NOW support program.  -Next office visit, consider spirometry for COPD and lung age estimation.  Written information provided.  F/U phone call next week  F/U Rx Clinic Visit  at the end of the month. Total time in face-to-face counseling 45 minutes.  Patient seen with Gala Murdoch, PharmD Candidate. Marland Kitchen

## 2021-04-14 NOTE — Patient Instructions (Addendum)
It was nice to see you today.  Start Chantix 0.5 mg by mouth once daily with food for 3 days, then 0.5 mg by mouth twice daily with food for thereafter. With Chantix, remember to take it with food. Some potential side effects include GI upset.  If you feel like you need extra support, you can contact with 1-800-QUIT NOW line. They are available after hours.  I will call you in a week to check in to see how it is going.

## 2021-04-14 NOTE — Patient Instructions (Addendum)
It was wonderful to see you today.  Please bring ALL of your medications with you to every visit.   Today we talked about:  -I sent a prescription for the Shingles vaccine to your pharmacy. Please get this at your earliest convenience.  -I am so proud of you. Please keep your appointments with Dr. Hartford Poli, Dr. Jenne Campus, and Dr. Valentina Lucks! -I am sending a referral to a Podiatrist for lesions on your foot. -You can use topical analgesics such as Aspercreme for your head pain. This will take time to heal. All your nerves seem fine, I don't think that something is wrong with your brain at this point. We will hold off on referring you to Neurology at this time.   Thank you for choosing [facility].   Please call [phone] with any questions about today's appointment.  Please be sure to schedule follow up at the front  desk before you leave today.   [name], DO PGY-2 Family Medicine

## 2021-04-17 ENCOUNTER — Encounter: Payer: Self-pay | Admitting: Family Medicine

## 2021-04-17 ENCOUNTER — Other Ambulatory Visit: Payer: Self-pay

## 2021-04-17 ENCOUNTER — Ambulatory Visit (INDEPENDENT_AMBULATORY_CARE_PROVIDER_SITE_OTHER): Payer: Medicaid Other | Admitting: Family Medicine

## 2021-04-17 VITALS — BP 131/83 | HR 72 | Wt 252.0 lb

## 2021-04-17 DIAGNOSIS — I890 Lymphedema, not elsewhere classified: Secondary | ICD-10-CM | POA: Diagnosis not present

## 2021-04-17 DIAGNOSIS — Z720 Tobacco use: Secondary | ICD-10-CM

## 2021-04-17 DIAGNOSIS — F39 Unspecified mood [affective] disorder: Secondary | ICD-10-CM | POA: Diagnosis not present

## 2021-04-17 DIAGNOSIS — R519 Headache, unspecified: Secondary | ICD-10-CM

## 2021-04-17 DIAGNOSIS — G8929 Other chronic pain: Secondary | ICD-10-CM

## 2021-04-17 DIAGNOSIS — Z0000 Encounter for general adult medical examination without abnormal findings: Secondary | ICD-10-CM

## 2021-04-17 MED ORDER — ZOSTER VAC RECOMB ADJUVANTED 50 MCG/0.5ML IM SUSR: 0.5000 mL | Freq: Once | INTRAMUSCULAR | 0 refills | Status: AC

## 2021-04-17 NOTE — Assessment & Plan Note (Signed)
Appears that she has chronic skin findings with long-standing lymphedema. Her skin changes do not appear consistent with warts and it was not recommended to apply cryotherapy due to concerns with healing. Recommend referral to [facility] referral today

## 2021-04-17 NOTE — Assessment & Plan Note (Signed)
Prescription for Shingrix sent to pharmacy.

## 2021-04-17 NOTE — Assessment & Plan Note (Signed)
Congratulated on efforts to cut back and praised her on initiating appointment with Dr. Valentina Lucks. Patient appears very motivated and I believe she will be successful. -Continue Chantix -F/u with Dr. Valentina Lucks as scheduled

## 2021-04-17 NOTE — Assessment & Plan Note (Signed)
Praised patient on coming off of opioids. She seems to be managing well without Oxy.  -Continue current non-narcotic pain regimen -Encourage physical activity

## 2021-04-17 NOTE — Assessment & Plan Note (Signed)
Appears superficial on top of scalp, without deformities. Neuro exam normal and no concern for acute intracranial process at this time. Recommended topical analgesics for pain relief. Do not feel that referral to Neurology is indicated at this time. Patient in agreement with plan.

## 2021-04-17 NOTE — Assessment & Plan Note (Signed)
On Lexapro, Hydroxyzine. She states mood is stable. No SI. PHQ-9 is elevated today. States she has an upcoming appointment with Dr. Hartford Poli and plans to open up about past trauma. I praised her on her efforts. She would like to have monthly therapy appointments. -Continue medications as prescribed -Continue therapy

## 2021-04-21 ENCOUNTER — Telehealth: Payer: Self-pay | Admitting: Pharmacist

## 2021-04-21 DIAGNOSIS — Z720 Tobacco use: Secondary | ICD-10-CM

## 2021-04-21 NOTE — Telephone Encounter (Signed)
Noted and agree. 

## 2021-04-21 NOTE — Telephone Encounter (Signed)
-----   Message from Leavy Cella, [place] sent at 04/14/2021  1:57 PM EST ----- Regarding: F/U chantix start -  quit smoking?  Make in-person visit for later in the month.

## 2021-04-21 NOTE — Telephone Encounter (Signed)
Patient contacted for follow/up of tobacco intake reduction attempt.   Since last contact patient reports reducing intake significantly.  She reports that a pack lasts her 3 days.  She notes that she has a friend who is also interested in quitting - which she believes may help with support and long-term success.   Medications currently being used; Varenicline 0.5mg  BID with food.   Patient denies any significant side effects from tobacco cessation therapy.   Rates IMPORTANCE of quitting tobacco remains high. Rates CONFIDENCE of quitting tobacco remains high..  Most common triggers to use tobacco include; other smokers   Encouraged use of 1 800-QUIT NOW support program.   Total time with patient call and documentation of interaction: 14 minutes.  F/U Phone call planned: 1 week.

## 2021-04-21 NOTE — Assessment & Plan Note (Signed)
Patient contacted for follow/up of tobacco intake reduction attempt.   Since last contact patient reports reducing intake significantly.  She reports that a pack lasts her 3 days.  She notes that she has a friend who is also interested in quitting - which she believes may help with support and long-term success.   Medications currently being used; Varenicline 0.5mg  BID with food.   Patient denies any significant side effects from tobacco cessation therapy.   Rates IMPORTANCE of quitting tobacco remains high. Rates CONFIDENCE of quitting tobacco remains high..  Most common triggers to use tobacco include; other smokers   Encouraged use of 1 800-QUIT NOW support program.   Total time with patient call and documentation of interaction: 14 minutes.  F/U Phone call planned: 1 week.

## 2021-04-22 ENCOUNTER — Ambulatory Visit (INDEPENDENT_AMBULATORY_CARE_PROVIDER_SITE_OTHER): Payer: Medicaid Other | Admitting: Psychology

## 2021-04-22 ENCOUNTER — Other Ambulatory Visit: Payer: Self-pay

## 2021-04-22 DIAGNOSIS — F32A Depression, unspecified: Secondary | ICD-10-CM

## 2021-04-22 DIAGNOSIS — F419 Anxiety disorder, unspecified: Secondary | ICD-10-CM

## 2021-04-22 NOTE — BH Specialist Note (Signed)
Integrated Behavioral Health Follow Up In-Person Visit  MRN: [id number] Name: TONGA PROUT  Number of [facility] Clinician visits: 2/6 Session Start time: 68  Session End time: 1130 Total time:30 minutes  Types of Service: Individual psychotherapy  Subjective: BRYAUNA BYRUM is a 56 y.o. female  Patient was referred by Dr. Nita Sells for anxiety/depression. Patient reports the following symptoms/concerns: Pt reported improved mood due to focus on her health. Pt reported feeling motivated because wanting to spend time with her kids and grandchildren.   Pt reported about her loss of her mother and processed emotions around it.   Discussed ways to keep her mind engaged.  Duration of problem: not known; Severity of problem: moderate  Objective: Mood: Euthymic and Affect: Appropriate Risk of harm to self or others: Suicidal ideation: none reported   Life Context: Family and Social: parent loss processing   Patient and/or Family's Strengths/Protective Factors: Social and Emotional competence  Goals Addressed: Patient will:  Reduce symptoms of: depression : feeling overwhelmed; down; processing loss  Increase knowledge and/or ability of: coping skills : journaling; processing emotions    Progress towards Goals: Ongoing  Interventions: Interventions utilized:  Veterinary surgeon and Supportive Counseling N/a  Patient and/or Family Response: Engaged in treatment planning   Patient Centered Plan: Patient is on the following Treatment Plan(s): anxiety/depression treatment plan Assessment: Patient currently experiencing depression syx.   Patient may benefit from CBT and processing emotions.  Plan: Follow up with behavioral health clinician on : 1 month per pt request  Behavioral recommendations: journaling Referral(s): [facility] (In Clinic)   Erlinda Hong, PhD., LMFT

## 2021-04-28 ENCOUNTER — Encounter: Payer: Self-pay | Admitting: Physician Assistant

## 2021-04-28 ENCOUNTER — Ambulatory Visit (INDEPENDENT_AMBULATORY_CARE_PROVIDER_SITE_OTHER): Payer: Medicaid Other | Admitting: Physician Assistant

## 2021-04-28 VITALS — BP 124/80 | HR 60 | Ht 68.0 in | Wt 249.6 lb

## 2021-04-28 DIAGNOSIS — R1013 Epigastric pain: Secondary | ICD-10-CM

## 2021-04-28 DIAGNOSIS — R1314 Dysphagia, pharyngoesophageal phase: Secondary | ICD-10-CM

## 2021-04-28 DIAGNOSIS — K2100 Gastro-esophageal reflux disease with esophagitis, without bleeding: Secondary | ICD-10-CM | POA: Diagnosis not present

## 2021-04-28 MED ORDER — DEXLANSOPRAZOLE 60 MG PO CPDR: 60.0000 mg | DELAYED_RELEASE_CAPSULE | Freq: Every day | ORAL | 3 refills | Status: DC

## 2021-04-28 MED ORDER — FAMOTIDINE 40 MG PO TABS: 40.0000 mg | ORAL_TABLET | Freq: Two times a day (BID) | ORAL | 5 refills | Status: DC

## 2021-04-28 NOTE — Progress Notes (Signed)
Addendum: Reviewed and agree with assessment and management plan. Samanth Mirkin M, MD  

## 2021-04-28 NOTE — Progress Notes (Signed)
Chief Complaint: Epigastric pain and dysphagia  HPI:    [name] is a 56 year old African-American female with a past medical history as listed below including COPD, depression, reflux and multiple others, known to Dr. Hilarie Fredrickson, who was referred to me by [name], DO for a complaint of epigastric pain and dysphagia.    08/29/2019 EGD and colonoscopy.  EGD with nonobstructing Schatzki's ring dilated with a [place].  Colonoscopy with 4 polyps and small internal hemorrhoids.  Polyps were adenomatous and repeat recommended in 5 years.    Today, the patient tells me that she is having a lot of the same issues she had when she was seen here last describing some pain at the top of her stomach and feels like "my ulcer is coming back".  Tells me this is burning and sharp and stabbing and anytime she eats anything it seems to flare and it can last for hours or even days.  She has also noted increase with anxiety.  This has worsened over the past couple of weeks but has been ongoing over the past 8 months.  Also describes she feels like it is hard for her to swallow food at times and thinks she needs to be "stretched again".  Currently taking Dexilant 60 mg daily.  Tells me a couple of times she is even taking it twice a day because "I just could not handle it".    Denies fever, chills, weight loss, blood in her stool or symptoms that awaken her from sleep.  Past Medical History:  Diagnosis Date   Allergy    Anxiety    Arthritis    knees,hands, hip  pt. Denies   Cellulitis    Colon polyps 2013   SESSIL SERRATED ADENOMA (ALL FRAGMENTS)   Complication of anesthesia    Lowered BP had to keep pt. overnight   COPD (chronic obstructive pulmonary disease) (HCC)    Depression    Fibroadenosis breast    GERD (gastroesophageal reflux disease)    does not take med   Heart murmur    as child  pt. denies   Hyperlipidemia    no meds - diet controlled   Hypothyroidism    Lymph edema    both  legs and feet   Migraine    Plantar fasciitis    Syphilis Treated in early 2012 during hospitalization   Uterine fibroid    Varicose veins     Past Surgical History:  Procedure Laterality Date   COLONOSCOPY  2013   cyst removed     behind left knee and left cheek   ENDOMETRIAL ABLATION     ESOPHAGOGASTRODUODENOSCOPY  2013   normal   EYE SURGERY     bilarteral   HYDRADENITIS EXCISION Right 09/13/2017   Procedure: EXCISION HIDRADENITIS GROIN;  Surgeon: Clovis Riley, MD;  Location: [facility];  Service: General;  Laterality: Right;  LMA   INCISION AND DRAINAGE ABSCESS / HEMATOMA OF BURSA / KNEE / THIGH     right upper thigh - 2008   NORPLANT REMOVAL     right upper arm - 12/2001   svd     x 2   THYROID SURGERY  2004   removed   TUBAL LIGATION     2003   WISDOM TOOTH EXTRACTION      Current Outpatient Medications  Medication Sig Dispense Refill   acetaminophen (TYLENOL) 500 MG tablet Take 500 mg by mouth every 6 (six) hours as needed.  albuterol (VENTOLIN HFA) 108 (90 Base) MCG/ACT inhaler INHALE 2 PUFFS INTO THE LUNGS EVERY 4 HOURS AS NEEDED FOR SHORTNESS OF BREATH. You will need to be seen for an appointment for additional refills. 8.5 g 0   clobetasol cream (TEMOVATE) 0.05 % Apply topically 2 (two) times daily. 120 g 3   cyclobenzaprine (FLEXERIL) 5 MG tablet TAKE 1 TABLET(5 MG) BY MOUTH AT BEDTIME AS NEEDED FOR MUSCLE SPASMS 30 tablet 1   dexlansoprazole (DEXILANT) 60 MG capsule Take 1 capsule (60 mg total) by mouth daily. 30 capsule 3   Elastic Bandages & Supports (MEDICAL COMPRESSION STOCKINGS) MISC 2 Units by Does not apply route daily. 2 each 0   escitalopram (LEXAPRO) 10 MG tablet TAKE 1 TABLET(10 MG) BY MOUTH DAILY 30 tablet 3   fluticasone (FLONASE) 50 MCG/ACT nasal spray SHAKE LIQUID AND USE 1 SPRAY IN EACH NOSTRIL EVERY DAY 16 g 12   furosemide (LASIX) 40 MG tablet TAKE 1 TABLET(40 MG) BY MOUTH DAILY 90 tablet 0   hydrOXYzine (ATARAX/VISTARIL) 10 MG tablet Take  10 mg by mouth daily as needed for anxiety.     levothyroxine (SYNTHROID) 150 MCG tablet TAKE 1 TABLET(150 MCG) BY MOUTH DAILY BEFORE AND BREAKFAST 90 tablet 0   ondansetron (ZOFRAN) 4 MG tablet TAKE 1 TABLET(4 MG) BY MOUTH EVERY 8 HOURS AS NEEDED 15 tablet 0   polyethylene glycol powder (GLYCOLAX/MIRALAX) 17 GM/SCOOP powder Take 17 g by mouth 2 (two) times daily as needed. 3350 g 1   pravastatin (PRAVACHOL) 40 MG tablet Take 1 tablet (40 mg total) by mouth at bedtime. 90 tablet 3   pregabalin (LYRICA) 150 MG capsule Take 2 capsules (300 mg total) by mouth 2 (two) times daily. 180 capsule 0   QUEtiapine (SEROQUEL) 50 MG tablet TAKE 1 TABLET(50 MG) BY MOUTH AT BEDTIME 90 tablet 0   SPIRIVA HANDIHALER 18 MCG inhalation capsule PLACE 1 CAPSULE INTO THE INHALER AND INHALE DAILY AS NEEDED 30 capsule 2   varenicline (CHANTIX) 0.5 MG tablet Take 1 tablet (0.5 mg total) by mouth 2 (two) times daily with a meal. 60 tablet 5   No current facility-administered medications for this visit.    Allergies as of 04/28/2021 - Review Complete 04/28/2021  Allergen Reaction Noted   Vicodin [hydrocodone-acetaminophen] Itching 02/02/2011    Family History  Problem Relation Age of Onset   Diabetes Father    Heart disease Father    Hyperlipidemia Father    Hypertension Father    Diabetes Mother    Hypertension Mother    Hyperlipidemia Mother    Diabetes Paternal Grandmother    Colon cancer Neg Hx    Esophageal cancer Neg Hx    Rectal cancer Neg Hx    Stomach cancer Neg Hx     Social History   Socioeconomic History   Marital status: Divorced    Spouse name: Not on file   Number of children: 2   Years of education: Not on file   Highest education level: Not on file  Occupational History   Occupation: disabled  Tobacco Use   Smoking status: Every Day    Packs/day: 0.50    Years: 25.00    Pack years: 12.50    Types: Cigarettes    Last attempt to quit: 06/30/2012    Years since quitting: 8.8    Smokeless tobacco: Never  Vaping Use   Vaping Use: Never used  Substance and Sexual Activity   Alcohol use: Not Currently  Comment: rare   Drug use: Not Currently    Types: Marijuana    Comment: past hx 11yrs ago   Sexual activity: Yes    Birth control/protection: Surgical  Other Topics Concern   Not on file  Social History Narrative   Lives with son in [place].    Social Determinants of Health   Financial Resource Strain: High Risk   Difficulty of Paying Living Expenses: Hard  Food Insecurity: No Food Insecurity   Worried About Charity fundraiser in the Last Year: Never true   Ran Out of Food in the Last Year: Never true  Transportation Needs: No Transportation Needs   Lack of Transportation (Medical): No   Lack of Transportation (Non-Medical): No  Physical Activity: Not on file  Stress: Stress Concern Present   Feeling of Stress : Very much  Social Connections: Not on file  Intimate Partner Violence: Not on file    Review of Systems:    Constitutional: No weight loss, fever or chills Cardiovascular: No chest pain Respiratory: No SOB  Gastrointestinal: See HPI and otherwise negative   Physical Exam:  Vital signs: BP 124/80    Pulse 60    Ht 5\' 8"  (1.727 m)    Wt 249 lb 9.6 oz (113.2 kg)    BMI 37.95 kg/m    Constitutional:   Pleasant overweight AA female appears to be in NAD, Well developed, Well nourished, alert and cooperative Respiratory: Respirations even and unlabored. Lungs clear to auscultation bilaterally.   No wheezes, crackles, or rhonchi.  Cardiovascular: Normal S1, S2. No MRG. Regular rate and rhythm. No peripheral edema, cyanosis or pallor.  Gastrointestinal:  Soft, nondistended, mild epigastric ttp, No rebound or guarding. Normal bowel sounds. No appreciable masses or hepatomegaly. Psychiatric:  Demonstrates good judgement and reason without abnormal affect or behaviors.  RELEVANT LABS AND IMAGING: CBC    Component Value Date/Time   WBC 4.0  08/12/2020 1419   WBC 3.0 (L) 09/12/2017 1153   RBC 4.11 08/12/2020 1419   RBC 3.98 09/12/2017 1153   HGB 13.2 08/12/2020 1419   HCT 39.0 08/12/2020 1419   PLT 246 08/12/2020 1419   MCV 95 08/12/2020 1419   MCH 32.1 08/12/2020 1419   MCH 32.9 09/12/2017 1153   MCHC 33.8 08/12/2020 1419   MCHC 34.5 09/12/2017 1153   RDW 12.2 08/12/2020 1419   LYMPHSABS 1.5 09/12/2017 1153   MONOABS 0.3 09/12/2017 1153   EOSABS 0.1 09/12/2017 1153   BASOSABS 0.0 09/12/2017 1153    CMP     Component Value Date/Time   NA 138 08/12/2020 1419   K 3.9 08/12/2020 1419   CL 100 08/12/2020 1419   CO2 25 08/12/2020 1419   GLUCOSE 93 08/12/2020 1419   GLUCOSE 122 (H) 09/12/2017 1153   BUN 11 08/12/2020 1419   CREATININE 0.99 08/12/2020 1419   CREATININE 0.92 02/10/2015 1458   CALCIUM 8.5 (L) 08/12/2020 1419   PROT 6.2 08/12/2020 1419   ALBUMIN 3.8 08/12/2020 1419   AST 24 08/12/2020 1419   ALT 24 08/12/2020 1419   ALKPHOS 112 08/12/2020 1419   BILITOT 0.5 08/12/2020 1419   GFRNONAA 74 07/05/2019 1546   GFRNONAA 73 02/10/2015 1458   GFRAA 85 07/05/2019 1546   GFRAA 85 02/10/2015 1458    Assessment: 1.  Epigastric pain: Increased over the past 8 months regardless of Dexilant 60 mg daily; I believe there is a functional component but also underlying gastritis 2.  Dysphagia: History of nonobstructing  Schatzki's ring last dilated in June 2021, patient now with increasing symptoms over the past 6 months; likely Schatzki's ring  Plan: 1.  Continue Dexilant 60 mg daily, prescribed #90 with 3 refills 2.  Start Pepcid 40 mg twice daily, every morning and nightly #60 with 5 refills 3.  Reviewed anti-dysphagia measures 4.  Scheduled patient for an EGD with dilation in the [facility] with Dr. Hilarie Fredrickson.  Did provide the patient a detailed list of risks for the procedure and she agrees to proceed. Patient is appropriate for endoscopic procedure(s) in the ambulatory ([place]) setting.  5.  Patient follow in clinic per  recommendations from Dr. Hilarie Fredrickson after time of procedure  Ellouise Newer, PA-C [facility] 04/28/2021, 11:22 AM  Cc: [name], DO

## 2021-04-28 NOTE — Patient Instructions (Addendum)
If you are age 56 or younger, your body mass index should be between 19-25. Your Body mass index is 37.95 kg/m. If this is out of the aformentioned range listed, please consider follow up with your Primary Care Provider.  ________________________________________________________  The  GI providers would like to encourage you to use [employer] to communicate with providers for non-urgent requests or questions.  Due to long hold times on the telephone, sending your provider a message by [facility] may be a faster and more efficient way to get a response.  Please allow 48 business hours for a response.  Please remember that this is for non-urgent requests.  _______________________________________________________  [name] have been scheduled for an endoscopy. Please follow written instructions given to you at your visit today. If you use inhalers (even only as needed), please bring them with you on the day of your procedure.  Continue Dexilant (dexalansoprazole) 60 mg 1 capsule daily START Pepcid (famotidine) 40 mg 1 tablet twice daily.  Follow up pending the results of your Endoscopy or as needed.  Thank you for entrusting me with your care and choosing [facility].  Ellouise Newer, PA-C

## 2021-04-30 ENCOUNTER — Other Ambulatory Visit: Payer: Self-pay | Admitting: Family Medicine

## 2021-04-30 DIAGNOSIS — E669 Obesity, unspecified: Secondary | ICD-10-CM

## 2021-05-07 ENCOUNTER — Telehealth: Payer: Self-pay | Admitting: Pharmacist

## 2021-05-07 ENCOUNTER — Ambulatory Visit: Payer: Medicaid Other | Admitting: Podiatry

## 2021-05-07 DIAGNOSIS — Z720 Tobacco use: Secondary | ICD-10-CM

## 2021-05-07 MED ORDER — VARENICLINE TARTRATE 1 MG PO TABS: 1.0000 mg | ORAL_TABLET | Freq: Two times a day (BID) | ORAL | 3 refills | Status: DC

## 2021-05-07 NOTE — Telephone Encounter (Signed)
-----   Message from Leavy Cella, [place] sent at 04/21/2021  2:32 PM EST ----- ?Regarding: Tobacco Cessation - set quit date vs increase dose of varenicline? ? ? ?

## 2021-05-07 NOTE — Telephone Encounter (Signed)
Noted and agree. 

## 2021-05-07 NOTE — Telephone Encounter (Signed)
Patient contacted for follow/up of tobacco intake reduction attempt.  ? ?Since last contact patient reports reduced intake however still smoking ~ 10 cigs per day with a few days of less - 3-4 when she was very busy.  ?She requests an increase dose of varenicline.  ? ?Medications currently being used;  ?Varenicline - 0.5mg  BID  ? ?Patient denies any significant side effects from tobacco cessation therapy.  ? ?Rates IMPORTANCE of quitting tobacco remains high.  ? ?Most common triggers to use tobacco include; habit.  ? ?New dose of varenicline 1mg  BID - new Rx provided.  ? ?Total time with patient call and documentation of interaction: 12 minutes. ? ?F/U plan - Visit in 1 week.  ? ? ? ?

## 2021-05-07 NOTE — Telephone Encounter (Signed)
-----   Message from Leavy Cella, [place] sent at 04/21/2021  2:32 PM EST ----- ?Regarding: Tobacco Cessation - set quit date vs increase dose of varenicline? ? ? ?

## 2021-05-07 NOTE — Assessment & Plan Note (Signed)
Patient contacted for follow/up of tobacco intake reduction attempt.  ? ?Since last contact patient reports reduced intake however still smoking ~ 10 cigs per day with a few days of less - 3-4 when she was very busy.  ?She requests an increase dose of varenicline.  ? ?Medications currently being used;  ?Varenicline - 0.5mg  BID  ? ?Patient denies any significant side effects from tobacco cessation therapy.  ? ?Rates IMPORTANCE of quitting tobacco remains high.  ? ?Most common triggers to use tobacco include; habit.  ? ?New dose of varenicline 1mg  BID - new Rx provided.  ?

## 2021-05-13 ENCOUNTER — Other Ambulatory Visit: Payer: Self-pay | Admitting: Student in an Organized Health Care Education/Training Program

## 2021-05-13 DIAGNOSIS — E890 Postprocedural hypothyroidism: Secondary | ICD-10-CM

## 2021-05-14 ENCOUNTER — Other Ambulatory Visit: Payer: Self-pay

## 2021-05-14 ENCOUNTER — Encounter: Payer: Self-pay | Admitting: Pharmacist

## 2021-05-14 ENCOUNTER — Ambulatory Visit (INDEPENDENT_AMBULATORY_CARE_PROVIDER_SITE_OTHER): Payer: Medicaid Other | Admitting: Pharmacist

## 2021-05-14 DIAGNOSIS — Z720 Tobacco use: Secondary | ICD-10-CM

## 2021-05-14 NOTE — Assessment & Plan Note (Signed)
Tobacco use disorder with moderate nicotine dependence of 34 years duration in a patient who is good candidate for success because of motivation to live a healthier life with her upcoming gastric procedure. She is currently on varenicline 1 mg twice daily and feels that is it helping her reduce smoking.  ?-Continued varenicline 1 mg by mouth twice daily. ?-Counseled the patient on avoiding triggers when possible. She mentioned that she could run errands to distract her, and she discussed putting away her ashtrays and lighters. The patient also plans to ask her cousin to go outside to smoke to avoid that trigger.  ?-Goal: quit smoking by 05/21/2021, which is the day before her gastric procedure.  ?

## 2021-05-14 NOTE — Patient Instructions (Addendum)
Nice to see you today! ? ?Today we discussed continuing your current Chantix regimen and making some changes to your daily routine, such as running errands as a distraction. We also talked about having your cousin go outside to smoke when she visits and putting away the ashtrays and lighters in your home. On the evening of 05/20/2021, try to throw away the rest of your cigarettes to help you with success in quitting completely.  ? ?On 04/28/2021, you weight 249 lbs. Today you weight 240 lbs.  ? ?Goal: quit by 05/21/2021 (day before procedure) ?

## 2021-05-14 NOTE — Progress Notes (Signed)
? ?S:  ?Patient arrives in good spirits and ambulating without assistance.   Patient arrives for evaluation/assistance with tobacco dependence. Patient was last seen by Primary Care Provider Dr. Nita Sells on 03/04/21.  ? ?Patient reports that she wakes up around 9:30/10 AM most days and does not smoke her first cigarette until around 12 PM. This is when her cousin comes over between jobs, and they watch Lifetime TV and smoke together. This social aspect of smoking is one of her biggest triggers along with boredom and stress. She reports that it is harder to avoid smoking when she isn't distracted and when she is around people who are smoking. She is taking varenicline twice daily and feels that it is helping her. She denies  any adverse effect from varenicline and reports that it makes her feel less anxious throughout the day. She also states that she has an upcoming gastric procedure scheduled for 05/22/2021 and looks forward to using this as a motivator to quit.  ? ?Age when started using tobacco on a daily basis 21. ?Brand smoked [place]. Number of cigarettes/day 3-4/day. Estimated nicotine content per cigarette (mg) 1 mg.  ?Estimated nicotine intake per day '4mg'$ .   ?Denies waking to smoke. ? ?ROS: ?Back pain 8/10 - reports that this decreases as she moves around during the day ? ?Fagerstrom Score ?Question Scoring Patient Score  ?How soon after waking do you smoke your first cigarette? <5 mins (3) ?5-30 mins (2) ?31-60 mins (1) ?>60 mins (0) 0  ?Do you find it difficult NOT to smoke in places where you shouldn't? Yes(1) ?No (0) 0  ?Which cigarette would you most hate to give up? First one in AM (1) ? ?Any other one (0)  ? ? ?  ?How many cigarettes do you smoke/day? 10 or less (0) ?11-20 (1) ?21-30 (2) ?>30 (3) 0  ?Do you smoke more during the first few hours after waking? Yes (1) ?No (0) 0  ?Do you smoke if you are so ill you cannot get out of bed? Yes (1) ?No (0)   ? Total Score   ?Score interpretation: low  1-2, low-to-moderate 3-4, moderate 5-7, high >7 ? ?Most recent quit attempt: ongoing ?Longest time ever been tobacco free 2 weeks in 2014 ? ?Medications used in past cessation efforts include: varenicline ? ?Rates IMPORTANCE of quitting tobacco on 1-10 scale of 10. ?Rates CONFIDENCE of quitting tobacco on 1-10 scale of 10. ? ?Most common triggers to use tobacco include; social - she smokes with her cousin while they watch Lifetime TV; boredom - she admits to smoking when she cannot stay distracted; stress ? ?Motivation to quit: upcoming gastric procedure; motivated to change her life and habits to better her health ? ?A/P: ?Tobacco use disorder with moderate nicotine dependence of 34 years duration in a patient who is good candidate for success because of motivation to live a healthier life with her upcoming gastric procedure. She is currently on varenicline 1 mg twice daily and feels that is it helping her reduce smoking.  ?-Continued varenicline 1 mg by mouth twice daily. ?-Counseled the patient on avoiding triggers when possible. She mentioned that she could run errands to distract her, and she discussed putting away her ashtrays and lighters. The patient also plans to ask her cousin to go outside to smoke to avoid that trigger.  ?-Goal: quit smoking by 05/21/2021, which is the day before her gastric procedure.  ? ?Written information provided.  F/U phone call 05/21/2021.  F/U Rx  Clinic Visit - will schedule following her procedure ? ?Total time in face-to-face counseling 38 minutes.  Patient seen with Earvin Hansen PharmD Candidate and Zenaida Deed, PharmD, PGY 1 pharmacy resident.  ?

## 2021-05-18 NOTE — Progress Notes (Signed)
Reviewed: I agree with Dr. Koval's documentation and management. 

## 2021-05-19 ENCOUNTER — Other Ambulatory Visit: Payer: Self-pay | Admitting: Family Medicine

## 2021-05-19 ENCOUNTER — Ambulatory Visit (INDEPENDENT_AMBULATORY_CARE_PROVIDER_SITE_OTHER): Payer: Medicaid Other | Admitting: Family Medicine

## 2021-05-19 ENCOUNTER — Encounter: Payer: Self-pay | Admitting: Family Medicine

## 2021-05-19 ENCOUNTER — Other Ambulatory Visit: Payer: Self-pay

## 2021-05-19 DIAGNOSIS — E669 Obesity, unspecified: Secondary | ICD-10-CM | POA: Diagnosis not present

## 2021-05-19 DIAGNOSIS — G629 Polyneuropathy, unspecified: Secondary | ICD-10-CM

## 2021-05-19 NOTE — Patient Instructions (Addendum)
Start using your travel thermos for water; make it a habit to take it with you wherever you go.   ? ?Make a list of activities you enjoy - or have enjoyed in the past, and think about what you can start doing again.   ? ?Web site for volunteer opportunities: ?ParkingHistory.de.jsp?aff=&r=20.0&l=[place],%20[place],%20[place]&publicOrgSearch= ? ?To increase your fluids intake, try: 1/2 tsp powdered ginger in 8-12 oz of vanilla soy milk and 8 oz of coffee.   ? ?To gain a pound of fat in 24 hours, you have to eat 3500 calories more than what your body burns in a day.  This is almost impossible for most people to do.  So yes, you CAN eat a piece of cake on your birthday without worrying about gaining weight.   ? ?Right now you are not meeting your energy or nutrient needs.  The best pattern of eating to meet protein and energy needs is 3 meals and 1-3 snacks per day.  For now, while your gastritis is especially bad, eating small amounts frequently during the day will be better for you.  When you restrict energy (calories) too much, your body can respond by slowing down metabolism, so it becomes harder to manage your weight.  A quick weight loss usually means you have lost muscle.  Muscle is the most active tissue in terms of burning calories.  In addition, as you lose muscle, you will lose stamina.   ? ?Until your stomach feels better, it's a good idea for you get your calories in liquid form.  For example, mix 1/3 to 1/2 dose of Orgain protein powder into 8-12 oz of milk.   ? ?Aim for getting some form of nutrition at least 6 times a day.  Try to eat within the first hour of getting up.   ? ?Once your stomach feels better, here are some food goals that will help you nourish yourself well while losing some weight:  ?1. Eat at least 3 REAL meals and 1-2 snacks per day.  Eat breakfast within one hour of getting up.  Aim for no more than 5 hours between eating.   ?A REAL meal includes at least some  protein, some starch, and vegetables and/or fruit.   ? ?2. Work up to including vegetables as about half the volume of your lunch and dinner.   ? ?3. Start walking outside once you start feeling better.   ? ?NOTE:  As you start feeling better, make your dietary changes slow and progressive so you don't trigger symptoms.   ? ?Follow-up appt on Tuesday, April 11 at 11 AM.    ?  ?

## 2021-05-19 NOTE — Progress Notes (Signed)
Medical Nutrition Therapy ?Appt start time: 1100 end time: 1200 (1 hour) ?PCP Lauren Settler, DO ?Primary concerns today: Weight management.  ? ?Relevant history/background: [name] was referred by [name] for weight and lipids management.  Other diagnoses that may influence efforts at lifestyle behavior change: restless leg syndrome, COPD, anxiety, and depression. In addition, recent symptoms related to nonobstructing Schatzki's ring and gastritis have made eating difficult.  [name] is in the process of quitting smoking.   ? ?Assessment:  [name] is looking for a "diet regimen" to help with overall health and weight.  She lives with her elderly aunt for whom she makes meals.  She has no energy most days, which likely has many contributing factors, including inadequate nutrition.   ? ?Learning Readiness: Change in progress; eating more fruit, cut down on sweets and most junk foods.     ? ?Usual eating pattern: 1 meal and 0 snacks per day. ?Frequent foods and beverages: water; peanuts; fruit.   ?Avoided foods: Sweets, bread and starches, fast foods, most fried foods.   ?Usual physical activity: None currently. ?Sleep: Estimates she gets about 5-6 hours per night.  Uses seroquel daily.   ? ?24-hr recall: ?(Up at 9:30 AM) ?B (10 AM)-   Few sips water with meds ?Snk ( AM)-   --- ?L (1 PM)-  5 fried okra, 3 breaded shrimp ?Snk ( PM)-  --- ?D ( PM)-  --- ?Snk ( PM)-  water ?Typical day? No.  Past 4 days, stomach pain and nausea has precluded most eating.  Prior to this, she was eating only fruit once a day, ~6 PM, e.g., 2-3 c grapes or cut fruit in an effort to lose weight.  Usually drinks 16-32 oz water per day.   ? ?Nutritional Diagnosis:  ?NI-1.4 Inadequate energy intake As related to food restriction.  As evidenced by usual intake of fruit only, eaten once a day. ? ?Handouts given during visit include: ?After-Visit Summary (AVS) ? ?Demonstrated degree of understanding via:  Teach Back  ?Barriers  to learning/adherence to lifestyle change: Poor understanding of energy balance and nutritional needs.   ? ?Monitoring/Evaluation:  Dietary intake, exercise, and body weight in 4 week(s). ? ?

## 2021-05-20 ENCOUNTER — Ambulatory Visit: Payer: Medicaid Other | Admitting: Podiatry

## 2021-05-20 ENCOUNTER — Telehealth: Payer: Self-pay | Admitting: Family Medicine

## 2021-05-20 NOTE — Telephone Encounter (Signed)
Patient called stating she is needing to speak to doctor before her procedure on Friday the 17th. She states it is very important for her to speak to her before this appointment. Also saying she is needing her Dexilant '60mg'$  capsules.  ? ?Call back number [phone].  ? ?Thanks! ?

## 2021-05-21 ENCOUNTER — Telehealth: Payer: Self-pay | Admitting: Pharmacist

## 2021-05-21 NOTE — Telephone Encounter (Signed)
Patient contacted for follow/up of tobacco intake reduction / cessation attempt.  ? ?Since last contact patient reports she has reduced her tobacco intake down to 1 per day yesterday.  She is committed to remain quit with her scheduled procedure tomorrow.  ? ?Medications currently being used; Varenicline  ?Patient denies any significant side effects from tobacco cessation therapy.  ?Patient reports 21# of weight loss over the last several weeks.  She reports meeting with the nutritionist and realizes she needs to increase intake and specifically protein intake.  ? ?Rates IMPORTANCE of quitting tobacco remains high. ?Rates CONFIDENCE of quitting tobacco remains high although she expressed some anxiety with her GI procedure tomorrow.  ? ?Total time with patient call and documentation of interaction: 13 minutes. ? ?F/U Phone call planned: 1 week. ? ? ?

## 2021-05-21 NOTE — Telephone Encounter (Signed)
Talk with patient over the phone.  She reports significant anxiety relating to her EGD tomorrow.  Reports previous history of panic attacks while in the waiting area, was wondering if she could have something for this.  Discussed that treating her anxiety prior to the procedure may be unsafe given that she will also be going under anesthesia.  Would defer to both GI/anesthesia tomorrow to see whether premedication is necessary or not. Patient requested that I contact her GI physician to advise her of her anxiety. Stated I would send a message but could not tell him what to do and it would ultimately be up to the team tomorrow to decide if any action is necessary. Tried to reassure patient that this procedure is generally safe and well tolerated without complications.  ?

## 2021-05-21 NOTE — Telephone Encounter (Signed)
-----   Message from Leavy Cella, [place] sent at 05/14/2021 12:58 PM EST ----- ?Regarding: QUIT DATE 3/16 ? ? ?

## 2021-05-21 NOTE — Telephone Encounter (Signed)
Noted and agree. 

## 2021-05-21 NOTE — Assessment & Plan Note (Signed)
Patient contacted for follow/up of tobacco intake reduction / cessation attempt.  ? ?Since last contact patient reports she has reduced her tobacco intake down to 1 per day yesterday.  She is committed to remain quit with her scheduled procedure tomorrow.  ? ?Medications currently being used; Varenicline  ?Patient denies any significant side effects from tobacco cessation therapy.  ?Patient reports 21# of weight loss over the last several weeks.  She reports meeting with the nutritionist and realizes she needs to increase intake and specifically protein intake.  ? ?Rates IMPORTANCE of quitting tobacco remains high. ?Rates CONFIDENCE of quitting tobacco remains high although she expressed some anxiety with her GI procedure tomorrow.  ?

## 2021-05-22 ENCOUNTER — Encounter: Payer: Self-pay | Admitting: Internal Medicine

## 2021-05-22 ENCOUNTER — Other Ambulatory Visit: Payer: Self-pay

## 2021-05-22 ENCOUNTER — Ambulatory Visit (AMBULATORY_SURGERY_CENTER): Payer: Medicaid Other | Admitting: Internal Medicine

## 2021-05-22 ENCOUNTER — Other Ambulatory Visit: Payer: Self-pay | Admitting: Internal Medicine

## 2021-05-22 VITALS — BP 131/79 | HR 62 | Temp 98.0°F | Resp 11 | Ht 68.0 in | Wt 249.0 lb

## 2021-05-22 DIAGNOSIS — R1013 Epigastric pain: Secondary | ICD-10-CM

## 2021-05-22 DIAGNOSIS — R1310 Dysphagia, unspecified: Secondary | ICD-10-CM | POA: Diagnosis not present

## 2021-05-22 DIAGNOSIS — K222 Esophageal obstruction: Secondary | ICD-10-CM

## 2021-05-22 DIAGNOSIS — R1314 Dysphagia, pharyngoesophageal phase: Secondary | ICD-10-CM | POA: Diagnosis not present

## 2021-05-22 DIAGNOSIS — K2950 Unspecified chronic gastritis without bleeding: Secondary | ICD-10-CM | POA: Diagnosis not present

## 2021-05-22 DIAGNOSIS — K2970 Gastritis, unspecified, without bleeding: Secondary | ICD-10-CM | POA: Diagnosis not present

## 2021-05-22 MED ORDER — SODIUM CHLORIDE 0.9 % IV SOLN: 500.0000 mL | Freq: Once | INTRAVENOUS | Status: DC

## 2021-05-22 NOTE — Progress Notes (Signed)
Pt non-responsive, VVS, Report to RN  °

## 2021-05-22 NOTE — Progress Notes (Signed)
VS  DT ? ?Pt's states no medical or surgical changes since previsit or office visit. ? ?

## 2021-05-22 NOTE — Progress Notes (Signed)
Called to room to assist during endoscopic procedure.  Patient ID and intended procedure confirmed with present staff. Received instructions for my participation in the procedure from the performing physician.  

## 2021-05-22 NOTE — Progress Notes (Signed)
See office note dated 04/28/2021 for details ? ?Patient presenting for EGD to evaluate recurrent dysphagia and epigastric pain ? ?She remains appropriate for [facility] upper endoscopy today ?

## 2021-05-22 NOTE — Op Note (Signed)
[facility] ?Patient Name: [name] ?Procedure Date: 05/22/2021 9:41 AM ?MRN: [id number] ?Endoscopist: Jerene Bears , MD ?Age: 56 ?Referring MD:  ?Date of Birth: [birth year] ?Gender: Female ?Account #: [id number] ?Procedure:                Upper GI endoscopy ?Indications:              Epigastric abdominal pain, Dysphagia ?Medicines:                Monitored Anesthesia Care ?Procedure:                Pre-Anesthesia Assessment: ?                          - Prior to the procedure, a History and Physical  ?                          was performed, and patient medications and  ?                          allergies were reviewed. The patient's tolerance of  ?                          previous anesthesia was also reviewed. The risks  ?                          and benefits of the procedure and the sedation  ?                          options and risks were discussed with the patient.  ?                          All questions were answered, and informed consent  ?                          was obtained. Prior Anticoagulants: The patient has  ?                          taken no previous anticoagulant or antiplatelet  ?                          agents. ASA Grade Assessment: III - A patient with  ?                          severe systemic disease. After reviewing the risks  ?                          and benefits, the patient was deemed in  ?                          satisfactory condition to undergo the procedure. ?                          After obtaining informed consent, the endoscope was  ?  passed under direct vision. Throughout the  ?                          procedure, the patient's blood pressure, pulse, and  ?                          oxygen saturations were monitored continuously. The  ?                          GIF [id number] #[id number] was introduced through the  ?                          mouth, and advanced to the second part of duodenum.  ?                          The upper GI  endoscopy was accomplished without  ?                          difficulty. The patient tolerated the procedure  ?                          well. ?Scope In: ?Scope Out: ?Findings:                 Normal mucosa was found in the entire esophagus. ?                          A non-obstructing Schatzki ring was found at the  ?                          gastroesophageal junction (37 cm from incisors).  ?                          The scope was withdrawn. Dilation was performed  ?                          with a Maloney dilator with mild resistance at 54  ?                          Fr. ?                          The entire examined stomach was normal. Biopsies  ?                          were taken with a cold forceps for histology and  ?                          Helicobacter pylori testing. ?                          The examined duodenum was normal. Biopsies were  ?                          taken with a cold forceps for histology. ?Complications:  No immediate complications. ?Estimated Blood Loss:     Estimated blood loss was minimal. ?Impression:               - Normal mucosa was found in the entire esophagus. ?                          - Non-obstructing Schatzki ring. Dilated. ?                          - Normal stomach. Biopsied. ?                          - Normal examined duodenum. Biopsied. ?Recommendation:           - Patient has a contact number available for  ?                          emergencies. The signs and symptoms of potential  ?                          delayed complications were discussed with the  ?                          patient. Return to normal activities tomorrow.  ?                          Written discharge instructions were provided to the  ?                          patient. ?                          - Continue present medications. ?                          - Await pathology results. ?                          - Post-dilation diet and then advanced diet as  ?                           tolerated. ?                          - Complete abdominal US for unexplained upper  ?                          abdominal pain. ?Jerene Bears, MD ?05/22/2021 10:07:48 AM ?This report has been signed electronically. ?

## 2021-05-22 NOTE — Patient Instructions (Signed)
Nothing by mouth until 11:00 am, clear liquids until 12, soft foods for the rest of today.  ? ? ? ?YOU HAD AN ENDOSCOPIC PROCEDURE TODAY AT [facility]:   Refer to the procedure report that was given to you for any specific questions about what was found during the examination.  If the procedure report does not answer your questions, please call your gastroenterologist to clarify.  If you requested that your care partner not be given the details of your procedure findings, then the procedure report has been included in a sealed envelope for you to review at your convenience later. ? ?YOU SHOULD EXPECT: Some feelings of bloating in the abdomen. Passage of more gas than usual.  Walking can help get rid of the air that was put into your GI tract during the procedure and reduce the bloating. If you had a lower endoscopy (such as a colonoscopy or flexible sigmoidoscopy) you may notice spotting of blood in your stool or on the toilet paper. If you underwent a bowel prep for your procedure, you may not have a normal bowel movement for a few days. ? ?Please Note:  You might notice some irritation and congestion in your nose or some drainage.  This is from the oxygen used during your procedure.  There is no need for concern and it should clear up in a day or so. ? ?SYMPTOMS TO REPORT IMMEDIATELY: ? ?Following upper endoscopy (EGD) ? Vomiting of blood or coffee ground material ? New chest pain or pain under the shoulder blades ? Painful or persistently difficult swallowing ? New shortness of breath ? Fever of 100?F or higher ? Black, tarry-looking stools ? ?For urgent or emergent issues, a gastroenterologist can be reached at any hour by calling [phone]. ?Do not use MyChart messaging for urgent concerns.  ? ? ?DIET:  Nothing by mouth until 11, clear liquids until 12, soft foods for the rest of today.  Tomorrow you may proceed to your regular diet.  Drink plenty of fluids but you should avoid alcoholic  beverages for 24 hours. ? ?ACTIVITY:  You should plan to take it easy for the rest of today and you should NOT DRIVE or use heavy machinery until tomorrow (because of the sedation medicines used during the test).   ? ?FOLLOW UP: ?Our staff will call the number listed on your records 48-72 hours following your procedure to check on you and address any questions or concerns that you may have regarding the information given to you following your procedure. If we do not reach you, we will leave a message.  We will attempt to reach you two times.  During this call, we will ask if you have developed any symptoms of COVID 19. If you develop any symptoms (ie: fever, flu-like symptoms, shortness of breath, cough etc.) before then, please call [phone].  If you test positive for Covid 19 in the 2 weeks post procedure, please call and report this information to [place].   ? ?If any biopsies were taken you will be contacted by phone or by letter within the next 1-3 weeks.  Please call us at [phone] if you have not heard about the biopsies in 3 weeks.  ? ? ?SIGNATURES/CONFIDENTIALITY: ?You and/or your care partner have signed paperwork which will be entered into your electronic medical record.  These signatures attest to the fact that that the information above on your After Visit Summary has been reviewed and is understood.  Full  responsibility of the confidentiality of this discharge information lies with you and/or your care-partner.  ?

## 2021-05-25 ENCOUNTER — Other Ambulatory Visit: Payer: Self-pay

## 2021-05-25 DIAGNOSIS — T161XXA Foreign body in right ear, initial encounter: Secondary | ICD-10-CM | POA: Diagnosis not present

## 2021-05-25 DIAGNOSIS — R1013 Epigastric pain: Secondary | ICD-10-CM

## 2021-05-25 DIAGNOSIS — R599 Enlarged lymph nodes, unspecified: Secondary | ICD-10-CM | POA: Diagnosis not present

## 2021-05-25 DIAGNOSIS — R110 Nausea: Secondary | ICD-10-CM | POA: Diagnosis not present

## 2021-05-25 DIAGNOSIS — R109 Unspecified abdominal pain: Secondary | ICD-10-CM

## 2021-05-25 DIAGNOSIS — R0602 Shortness of breath: Secondary | ICD-10-CM | POA: Diagnosis not present

## 2021-05-26 ENCOUNTER — Telehealth: Payer: Self-pay | Admitting: *Deleted

## 2021-05-26 ENCOUNTER — Ambulatory Visit: Payer: Medicaid Other

## 2021-05-26 NOTE — Telephone Encounter (Signed)
agree

## 2021-05-26 NOTE — Telephone Encounter (Signed)
?  Follow up Call- ? ?Call back number 05/22/2021 08/29/2019  ?Post procedure Call Back phone  # [phone]  ?Permission to leave phone message Yes Yes  ?Some recent data might be hidden  ?  ? ?Patient questions: ? ?Do you have a fever, pain , or abdominal swelling? No. ?Pain Score  0 * ? ?Have you tolerated food without any problems? Yes.   ? ?Have you been able to return to your normal activities? Yes.   ? ?Do you have any questions about your discharge instructions: ?Diet   No. ?Medications  No. ?Follow up visit  No. ? ?Do you have questions or concerns about your Care? Yes.  Pt c/o neck glands swelling and tongue swelling that started on 05/25/21. See urgent care report from that date. Pt started on prednisone and nausea medication and states she has an appointment to see her PCP today. Instructed pt that her symptoms were likely not related to the procedure. Instructed that RN would make Dr. Hilarie Fredrickson aware, but that he would likely not have any different recommendations at this time and that she should see her PCP as scheduled. Pt verbalized understanding.  ? ?Actions: ?* If pain score is 4 or above: ?Physician/ provider Notified : Zenovia Jarred, MD. ?Date 05/26/2021 ? at Time 7:32 AM ?. ? ? ?

## 2021-05-27 ENCOUNTER — Ambulatory Visit (INDEPENDENT_AMBULATORY_CARE_PROVIDER_SITE_OTHER): Payer: Medicaid Other | Admitting: Family Medicine

## 2021-05-27 ENCOUNTER — Other Ambulatory Visit: Payer: Self-pay

## 2021-05-27 ENCOUNTER — Encounter: Payer: Self-pay | Admitting: Internal Medicine

## 2021-05-27 VITALS — BP 106/83 | HR 75 | Wt 240.0 lb

## 2021-05-27 DIAGNOSIS — K146 Glossodynia: Secondary | ICD-10-CM | POA: Diagnosis not present

## 2021-05-27 DIAGNOSIS — R220 Localized swelling, mass and lump, head: Secondary | ICD-10-CM | POA: Diagnosis not present

## 2021-05-27 DIAGNOSIS — R109 Unspecified abdominal pain: Secondary | ICD-10-CM

## 2021-05-27 DIAGNOSIS — T783XXD Angioneurotic edema, subsequent encounter: Secondary | ICD-10-CM

## 2021-05-27 DIAGNOSIS — T783XXA Angioneurotic edema, initial encounter: Secondary | ICD-10-CM | POA: Insufficient documentation

## 2021-05-27 MED ORDER — MAGIC MOUTHWASH W/LIDOCAINE: 5.0000 mL | Freq: Four times a day (QID) | ORAL | 1 refills | Status: DC | PRN

## 2021-05-27 NOTE — Progress Notes (Signed)
? ? ?  SUBJECTIVE:  ? ?CHIEF COMPLAINT / HPI: throat closing and enlarged nodes ? ?Patient states that she has been taking an oral steroid and benadryl. Patient reports that she is still experiencing lip swelling and her enlarged glands feel tender to touch. She reports that her tongue had swollen and uis sore making it difficult to eat. She does not think the steroids or benadryl helped. She states that she has been taking vitamin B. She reports having some changes to her breathing.  Patient reports that the tightness of her throat feels like it is on the outside and nothing inside is she using using a throat spray since her procedure. ? ?PERTINENT  PMH / PSH:  ?Anxiety  ?Depression  ?Allergic Reaction, angioedema  ? ?OBJECTIVE:  ? ?BP 106/83   Pulse 75   Wt 240 lb (108.9 kg)   SpO2 100%   BMI 36.49 kg/m?   ?Physical Exam ?Constitutional:   ?   General: She is not in acute distress. ?   Appearance: Normal appearance. She is normal weight. She is not ill-appearing, toxic-appearing or diaphoretic.  ?HENT:  ?   Right Ear: Tympanic membrane normal.  ?   Left Ear: Tympanic membrane normal.  ?   Mouth/Throat:  ?   Mouth: Mucous membranes are moist.  ?   Pharynx: Uvula midline. No posterior oropharyngeal erythema or uvula swelling.  ?   Tonsils: No tonsillar exudate.  ?   Comments: Tongue with central fissures ?Do not appreciate enlarged lips, no lesions  ?No oral lesions  ?Evidence of buccal injury  ?Cardiovascular:  ?   Rate and Rhythm: Normal rate and regular rhythm.  ?   Heart sounds: Normal heart sounds.  ?Pulmonary:  ?   Effort: Pulmonary effort is normal. No respiratory distress.  ?   Breath sounds: Normal breath sounds. No stridor. No wheezing, rhonchi or rales.  ?Musculoskeletal:  ?   Cervical back: Tenderness present. No rigidity.  ?Neurological:  ?   Mental Status: She is alert.  ? ? ?ASSESSMENT/PLAN:  ? ?Angioedema ?Patient presents for follow-up after presenting with angioedema and sensation of her throat  tightening.  Patient reports that symptoms improved and seem to return early this morning.  Patient is able to maintain airway in clinic and does not show evidence of stridor nor wheezing on exam.  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 ? ?We will have patient continue Pepcid and complete steroid course prescribed in urgent care ?We will have patient have close follow-up on 3/24 ?Reviewed signs of airway compromise with patient who voiced understanding ?  ? ? ?Eulis Foster, MD ?[place]  ?

## 2021-05-27 NOTE — Patient Instructions (Signed)
It is likely that you are still experiencing some inflammation following your procedure. ? ?Please schedule an appointment to follow-up with [place] on Friday of this week so that we can carefully monitor for any tightness that you may be feeling in your neck or throat area. ? ?Please call 911 or go to the ED if you are unable to take a deep breath or notice quickly increasing swelling of your throat/neck. ? ?Please continue the Pepcid.  Please switch the Benadryl to cetirizine or Zyrtec daily. ?

## 2021-05-27 NOTE — Assessment & Plan Note (Signed)
Patient presents for follow-up after presenting with angioedema and sensation of her throat tightening.  Patient reports that symptoms improved and seem to return early this morning.  Patient is able to maintain airway in clinic and does not show evidence of stridor nor wheezing on exam.  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 ? ?We will have patient continue Pepcid and complete steroid course prescribed in urgent care ?We will have patient have close follow-up on 3/24 ?Reviewed signs of airway compromise with patient who voiced understanding ? ?

## 2021-05-29 ENCOUNTER — Encounter: Payer: Self-pay | Admitting: Student

## 2021-05-29 ENCOUNTER — Telehealth: Payer: Self-pay | Admitting: Pharmacist

## 2021-05-29 ENCOUNTER — Other Ambulatory Visit: Payer: Self-pay

## 2021-05-29 ENCOUNTER — Ambulatory Visit (INDEPENDENT_AMBULATORY_CARE_PROVIDER_SITE_OTHER): Payer: Medicaid Other | Admitting: Student

## 2021-05-29 DIAGNOSIS — Z720 Tobacco use: Secondary | ICD-10-CM

## 2021-05-29 DIAGNOSIS — T783XXD Angioneurotic edema, subsequent encounter: Secondary | ICD-10-CM | POA: Diagnosis not present

## 2021-05-29 NOTE — Telephone Encounter (Signed)
-----   Message from Leavy Cella, [place] sent at 05/21/2021  1:51 PM EDT ----- ?Regarding: Tobacco Cessaton for ? ### days quit date was 3/16 ? ? ?

## 2021-05-29 NOTE — Progress Notes (Signed)
?  SUBJECTIVE:  ? ?CHIEF COMPLAINT / HPI:  ? ?Patient follow up for angioedema. Seen 05/27/21 for concern of lip/throat swelling. At time no concern for airway compromise, or notable lip swelling. Patient encouraged to complete steroid course from ED visit 05/25/21 (prednisone 40 mg daily x 5 day, Benadryl 50 mg q6h). Patient reports she completed the course of prednisone.  ? ?Reports feeling sore in throat, with some puffiness. Lips havent swelled, breathing ok. No issues swallowing, or throwing up. No headaches or vision changes. No fevers/chills. Roof of mouth itches, and tounge is swollen in the mornings, but goes down during the day.  ? ? ? ?PERTINENT  PMH / PSH: Allergies, lymph edema, hypothyroidism, COPD, MDD ? ? ?OBJECTIVE:  ?BP 105/74   Pulse 70   Wt 234 lb 12.8 oz (106.5 kg)   SpO2 98%   BMI 35.70 kg/m?  ? ?General: NAD, pleasant, able to participate in exam ?Throat: No swelling or erythema noted in throat or tounge, no cervical lymphadenopathy, airway patent, neck soft/supple ?Neuro: alert, no obvious focal deficits, speech normal ?Psych: Normal affect and mood ? ?ASSESSMENT/PLAN:  ?Angioedema ?Patient swelling appears to be improved. She continues to complain of symptoms that relapse/remit on their own (tounge swelling in morning, gone by afternoon). Symptoms do not inhibit her ability to swallow or breath. Etiology of this episode is unknown. Patient wanted to follow up with allergist to test for allergens/cause of swelling. Will recommend benadryl prn for swelling ?-Benadryl 25 mg prn for tounge swelling ?-Amb Ref to Allergist ?  ?Orders Placed This Encounter  ?Procedures  ? Ambulatory referral to Allergy  ?  Referral Priority:   Routine  ?  Referral Type:   Allergy Testing  ?  Referral Reason:   Specialty Services Required  ?  Requested Specialty:   Allergy  ?  Number of Visits Requested:   1  ? ?No orders of the defined types were placed in this encounter. ? ?No follow-ups on file. ?'@[facility]'$ @ ? ?

## 2021-05-29 NOTE — Telephone Encounter (Signed)
Patient contacted for follow/up of tobacco cessation attempt.  ? ?Since last contact patient reports she is doing well. She had 1 cigarette yesterday but otherwise has no smoked this week.  Congratulated on her success.  ? ?Medications currently being used; Varenicline '1mg'$  BID ?Patient denies any significant side effects from tobacco cessation therapy.  ? ?Rates CONFIDENCE of remaining quit from tobacco on 1-10 scale of 9 for the next 7 days.  ? ?Most common triggers to use tobacco include; other smokers, old triggers  ? ?Total time with patient call and documentation of interaction: 13 minutes. ? ?F/U Phone call planned: 7 days ? ? ?

## 2021-05-29 NOTE — Patient Instructions (Signed)
It was great to see you! Thank you for allowing me to participate in your care! ? ? ?Our plans for today:  ?- Take Benadryl as needed for symptoms (tongue swelling/itching, throat swelling/itching) ?- Referral to Allergy specialist ? ?Seek doctor if: ?Throat swells/itches ?Tongue swells/itches ?Difficulty breathing ?Difficulty swallowing ? ?Take care and seek immediate care sooner if you develop any concerns.  ? ?Dr. Holley Bouche, MD ?[place] ? ?

## 2021-05-29 NOTE — Assessment & Plan Note (Addendum)
Patient swelling appears to be improved. She continues to complain of symptoms that relapse/remit on their own (tounge swelling in morning, gone by afternoon). Symptoms do not inhibit her ability to swallow or breath. Etiology of this episode is unknown. Patient wanted to follow up with allergist to test for allergens/cause of swelling. Will recommend benadryl prn for swelling ?-Benadryl 25 mg prn for tounge swelling ?-Amb Ref to Allergist ?

## 2021-05-29 NOTE — Assessment & Plan Note (Signed)
Patient contacted for follow/up of tobacco cessation attempt.  ? ?Since last contact patient reports she is doing well. She had 1 cigarette yesterday but otherwise has no smoked this week.  Congratulated on her success.  ? ?Medications currently being used; Varenicline '1mg'$  BID ?Patient denies any significant side effects from tobacco cessation therapy.  ? ?Rates CONFIDENCE of remaining quit from tobacco on 1-10 scale of 9 for the next 7 days.  ? ?Most common triggers to use tobacco include; other smokers, old triggers  ? ?Total time with patient call and documentation of interaction: 13 minutes. ? ?F/U Phone call planned: 7 days ?

## 2021-06-01 DIAGNOSIS — R220 Localized swelling, mass and lump, head: Secondary | ICD-10-CM | POA: Insufficient documentation

## 2021-06-01 NOTE — Telephone Encounter (Signed)
Noted and agree. 

## 2021-06-03 ENCOUNTER — Ambulatory Visit (HOSPITAL_COMMUNITY)
Admission: RE | Admit: 2021-06-03 | Discharge: 2021-06-03 | Disposition: A | Discharge status: Home / Self Care | Payer: Medicaid Other | Source: Ambulatory Visit | Attending: Internal Medicine | Admitting: Internal Medicine

## 2021-06-03 ENCOUNTER — Other Ambulatory Visit: Payer: Self-pay

## 2021-06-03 DIAGNOSIS — K760 Fatty (change of) liver, not elsewhere classified: Secondary | ICD-10-CM | POA: Diagnosis not present

## 2021-06-03 DIAGNOSIS — R109 Unspecified abdominal pain: Secondary | ICD-10-CM | POA: Diagnosis not present

## 2021-06-04 ENCOUNTER — Telehealth: Payer: Self-pay | Admitting: Pharmacist

## 2021-06-04 DIAGNOSIS — Z720 Tobacco use: Secondary | ICD-10-CM

## 2021-06-04 NOTE — Patient Instructions (Addendum)
It was wonderful to see you today. ? ?Please bring ALL of your medications with you to every visit.  ? ?Today we talked about: ? ?-We are starting a new medication called Cymbalta. You will take 30 mg once a day. At the same time, we will wean you off of your Escitalopram (Lexapro). You should go down to 5 mg of Escitalopram daily. After two weeks (14 days), I want you to stop your Escitalopram (Lexapro), and we will increase your Cymbalta.  ? ?-You can continue to use Lidocaine patches to your back. You can also use Voltaren gel as needed to your back for the pain.  ? ?-Movement is key! And continue to work towards weight loss as this will also help! ? ?-A referral to Allergy was sent previously. You can contact them below:  ?Address: [address] [place], [place] [address] ?Phone: [phone] ? ?Thank you for choosing [facility].  ? ?Please call [phone] with any questions about today's appointment. ? ?Please be sure to schedule follow up at the front  desk before you leave today.  ? ?[name], DO ?PGY-2 Family Medicine   ?

## 2021-06-04 NOTE — Telephone Encounter (Signed)
-----   Message from Leavy Cella, [place] sent at 05/29/2021  3:12 PM EDT ----- ?Regarding: Qui for 1 week? ? ? ?

## 2021-06-04 NOTE — Telephone Encounter (Signed)
Patient contacted for follow/up of tobacco cessation attempt.  ?Since last contact patient reports she has been smoke free since last in-person visit with [place] in pharmacy clinic with the single exception of "2 pulls" on a cigarette on her birthday (4 days ago).  States her craving for cigarettes remain significant at times of stress.  ?Medications currently being used:  Varenicline - '1mg'$  BID ?Patient denies any significant side effects from tobacco cessation therapy.  ?Rates CONFIDENCE of remaining tobacco free as high.  ? ?Plan to continue varenicline '1mg'$  BID at this time.  Patient has adequate refills at this time.  ?Total time with patient call and documentation of interaction: 13 minutes. ? ?F/U Phone call planned: 3 weeks.  ? ? ?

## 2021-06-04 NOTE — Assessment & Plan Note (Signed)
Patient contacted for follow/up of tobacco cessation attempt.  ?Since last contact patient reports she has been smoke free since last in-person visit with [place] in pharmacy clinic with the single exception of "2 pulls" on a cigarette on her birthday (4 days ago).  States her craving for cigarettes remain significant at times of stress.  ?Medications currently being used:  Varenicline - '1mg'$  BID ?Patient denies any significant side effects from tobacco cessation therapy.  ?Rates CONFIDENCE of remaining tobacco free as high.  ?

## 2021-06-04 NOTE — Telephone Encounter (Signed)
Noted and agree. 

## 2021-06-04 NOTE — Progress Notes (Signed)
? ? ?  SUBJECTIVE:  ? ?CHIEF COMPLAINT / HPI:  ? ?Discuss Pain ?[name] is a 56 y.o. female who presents today for follow up on her chronic pain. She is now off of opioids and managing fairly well. She would like to discuss other options of controlling her pain.  ? ?Tobacco Cessation ?[name] has been working with our pharmacist, Dr. Valentina Lucks, to stop smoking. She is on varenicline and tolerating well. She has not smoked since her birthday (5 days ago), only had "2 pulls" at that time.  ? ?Lymphadenopathy ?Notes that she has some lymphadenopathy in her neck that is concerning to her.  She is wondering if she needs additional imaging to evaluate.  It is tender to palpation.  ? ?PERTINENT  PMH / PSH: Lymphedema, depression, anxiety  ? ?OBJECTIVE:  ? ?BP 123/79   Pulse 93   Wt 241 lb 3.2 oz (109.4 kg)   SpO2 100%   BMI 36.67 kg/m?   ? ?General: NAD, pleasant, able to participate in exam ?HEENT: Normocephalic, atraumatic, hyperpigmentation in mouth, no angioedema ?Neck: Supple, b/l anterior cervical LAD that is approximately 1x1 cm, mobile and tender to palpation  ?Respiratory: Normal respiratory effort ?Psych: Normal affect and mood ? ?ASSESSMENT/PLAN:  ? ?Chronic pain ?Has been managing fairly well off of her opioid medications.  Still able to mobilize and do normal daily activities.  She is currently on an SSRI, Lyrica and Flexeril.  We discussed other options to help control her pain further. I think she would benefit from switch to Cymbalta.  ?-Reduce Lexapro to 5 mg daily; start Cymbalta 30 mg  ?-In two weeks I have set a reminder to contact patient to have her stop her Lexapro, and can increase Cymbalta at that time  ?-Continue Tylenol/Ibuprofen, Voltaren gel, and Lidocaine patches as needed  ?-Continue weight loss journey ?-Continue to stay active ? ?Tobacco abuse ?Congratulated patient on her efforts to stop smoking. She has not smoked in 4 days now. She is still going through withdrawals but hopeful  that they will stop soon. Thank you to Dr. Valentina Lucks and the pharmacy team for assisting her with tobacco cessation! ? ?Lymphadenopathy ?Bilateral and anterior cervical chains.  Reassuring that both lymph nodes are mobile, soft, and tender to palpation. Discussed holding off on imaging for now given these reassuring signs. Can continue to monitor with time to see if evolving. I would expect resolution with time. Certainly can order imaging if enlarging or if any concerning changes.  ?  ? ? ?Lauren Settler, DO ?[place]  ? ?

## 2021-06-05 ENCOUNTER — Encounter: Payer: Self-pay | Admitting: Family Medicine

## 2021-06-05 ENCOUNTER — Other Ambulatory Visit: Payer: Self-pay

## 2021-06-05 ENCOUNTER — Ambulatory Visit (INDEPENDENT_AMBULATORY_CARE_PROVIDER_SITE_OTHER): Payer: Medicaid Other | Admitting: Family Medicine

## 2021-06-05 DIAGNOSIS — G8929 Other chronic pain: Secondary | ICD-10-CM

## 2021-06-05 DIAGNOSIS — Z720 Tobacco use: Secondary | ICD-10-CM

## 2021-06-05 DIAGNOSIS — R591 Generalized enlarged lymph nodes: Secondary | ICD-10-CM | POA: Diagnosis not present

## 2021-06-05 DIAGNOSIS — K760 Fatty (change of) liver, not elsewhere classified: Secondary | ICD-10-CM

## 2021-06-05 MED ORDER — DULOXETINE HCL 30 MG PO CPEP: 30.0000 mg | ORAL_CAPSULE | Freq: Every day | ORAL | 0 refills | Status: DC

## 2021-06-05 NOTE — Assessment & Plan Note (Signed)
Bilateral and anterior cervical chains.  Reassuring that both lymph nodes are mobile, soft, and tender to palpation. Discussed holding off on imaging for now given these reassuring signs. Can continue to monitor with time to see if evolving. I would expect resolution with time. Certainly can order imaging if enlarging or if any concerning changes.  ?

## 2021-06-05 NOTE — Assessment & Plan Note (Signed)
Congratulated patient on her efforts to stop smoking. She has not smoked in 4 days now. She is still going through withdrawals but hopeful that they will stop soon. Thank you to Dr. Valentina Lucks and the pharmacy team for assisting her with tobacco cessation! ?

## 2021-06-05 NOTE — Assessment & Plan Note (Signed)
Has been managing fairly well off of her opioid medications.  Still able to mobilize and do normal daily activities.  She is currently on an SSRI, Lyrica and Flexeril.  We discussed other options to help control her pain further. I think she would benefit from switch to Cymbalta.  ?-Reduce Lexapro to 5 mg daily; start Cymbalta 30 mg  ?-In two weeks I have set a reminder to contact patient to have her stop her Lexapro, and can increase Cymbalta at that time  ?-Continue Tylenol/Ibuprofen, Voltaren gel, and Lidocaine patches as needed  ?-Continue weight loss journey ?-Continue to stay active ?

## 2021-06-09 ENCOUNTER — Ambulatory Visit: Payer: Medicaid Other | Admitting: Pharmacist

## 2021-06-09 ENCOUNTER — Ambulatory Visit: Payer: Medicaid Other | Admitting: Psychology

## 2021-06-09 ENCOUNTER — Telehealth: Payer: Self-pay | Admitting: Pharmacist

## 2021-06-09 NOTE — Telephone Encounter (Signed)
Attempted to contact patient for follow-up of missed face-to-face tobacco cessation appointment.  ? ?Left HIPAA compliant voice mail.   ? ?Total time with patient call and documentation of interaction: 6 minutes. ? ?Additional F/U Phone call planned: tomorrow  ? ?

## 2021-06-10 ENCOUNTER — Telehealth: Payer: Self-pay | Admitting: Pharmacist

## 2021-06-10 NOTE — Telephone Encounter (Signed)
-----   Message from Leavy Cella, [place] sent at 06/09/2021 11:06 AM EDT ----- ?Regarding: Tobacco Cessation F/U  Missed Appt 06/09/21 ? ? ?

## 2021-06-10 NOTE — Telephone Encounter (Signed)
Attempted to contact patient for follow-up of tobacco intake reduction / cessation.  ? ? ?Left HIPAA compliant voice mail  ?Total time with patient call and documentation of interaction: 6 minutes. ? ?Additional F/U Phone call planned: 2 weeks ? ?

## 2021-06-16 ENCOUNTER — Encounter: Payer: Self-pay | Admitting: Family Medicine

## 2021-06-16 ENCOUNTER — Ambulatory Visit: Payer: Medicaid Other | Admitting: Family Medicine

## 2021-06-19 ENCOUNTER — Telehealth: Payer: Self-pay | Admitting: Family Medicine

## 2021-06-19 NOTE — Telephone Encounter (Signed)
Called patient to check and given her multiple recent missed appointments.  States that she is doing well.  States that she recently got connected with another therapist and is started on Bupropion for her anxiety and for her nicotine cravings.  She feels like this is helping tremendously. She is apologetic for missing her other appointments and appreciative for the call to check in.  ?

## 2021-06-20 ENCOUNTER — Other Ambulatory Visit: Payer: Self-pay | Admitting: Family Medicine

## 2021-06-23 ENCOUNTER — Ambulatory Visit: Payer: Medicaid Other | Admitting: Podiatry

## 2021-06-29 ENCOUNTER — Telehealth: Payer: Self-pay | Admitting: Pharmacist

## 2021-06-29 DIAGNOSIS — Z720 Tobacco use: Secondary | ICD-10-CM

## 2021-06-29 NOTE — Telephone Encounter (Signed)
Patient contacted for follow/up of tobacco intake reduction / tobacco cessation attempt.  ? ?Since last contact patient reports she was experiencing relapses of cigarette use at the end of March/Early April.  She saw a mental health provider who initiated bupropion '150mg'$  SR - two tablets ('300mg'$  dose) once daily in the morning.  ? ?She reports stopping the varenicline at that time.  ?She denies smoking any cigarettes since April 7th or 8th.  ? ?Patient denies any significant side effects from tobacco cessation therapy.  ? ?Congratulated on success with quitting.  Patient has planned visits with both PCP and mental health provider scheduled.   ? ?I will plan to follow-up by phone in two weeks.  ? ?Total time with patient call and documentation of interaction: 11 minutes. ? ?She was very happy with her progress in quitting cigarettes.  ? ? ?

## 2021-06-29 NOTE — Assessment & Plan Note (Signed)
Patient contacted for follow/up of tobacco intake reduction / tobacco cessation attempt.  ? ?Since last contact patient reports she was experiencing relapses of cigarette use at the end of March/Early April.  She saw a mental health provider who initiated bupropion '150mg'$  SR - two tablets ('300mg'$  dose) once daily in the morning.  ? ?She reports stopping the varenicline at that time.  ?She denies smoking any cigarettes since April 7th or 8th.  ? ?Patient denies any significant side effects from tobacco cessation therapy.  ? ?Congratulated on success with quitting.  Patient has planned visits with both PCP and mental health provider scheduled.   ? ?I will plan to follow-up by phone in two weeks.  ?

## 2021-06-29 NOTE — Telephone Encounter (Signed)
Noted and agree. 

## 2021-06-29 NOTE — Telephone Encounter (Signed)
-----   Message from Leavy Cella, [place] sent at 06/04/2021  2:17 PM EDT ----- ?Regarding: Quit for several weeks at end of March - ocntinued abstinence?   Taper varenicline to daily? ? ? ?

## 2021-07-02 ENCOUNTER — Ambulatory Visit: Payer: Medicaid Other | Admitting: Podiatry

## 2021-07-08 ENCOUNTER — Ambulatory Visit: Payer: Medicaid Other | Admitting: Family Medicine

## 2021-07-13 NOTE — Progress Notes (Signed)
? ? ?  SUBJECTIVE:  ? ?CHIEF COMPLAINT / HPI:  ? ?Halitosis ?Ongoing for the last 15 years. Last seen a dentist 2 years ago. Brushes once to twice a day. Flosses and does mouth wash. Feels like nothing helps. No mouth sores or dental pain. She recently had an EGD and esophageal dilation. Takes Dexilant for reflux. ? ?PERTINENT  PMH / PSH:  ?Chronic pain, fibromyalgia, HLD, lymphedema ? ? ?OBJECTIVE:  ? ?BP (!) 138/94   Pulse 71   Wt 241 lb 12.8 oz (109.7 kg)   SpO2 99%   BMI 36.77 kg/m?   ? ?General: NAD, pleasant, able to participate in exam ?HEENT: Normocephalic, atraumatic, nares patent, oropharynx clear, melanosis of gums, no halitosis noted ?Respiratory: Breathing comfortably ?Psych: Normal affect and mood ? ?ASSESSMENT/PLAN:  ? ?Halitosis ?Overdue for dental examination/cleaning. She is on medication for reflux. No symptoms concerning for achalasia, esophageal spasms. No dysphagia. Recent EGD in March notable for nonobstructing Schatzki ring, dilation was performed.  Otherwise, the esophagus appeared normal, stomach was normal. Discussed brushing twice a day (to tongue as well), mouth wash and floss daily. Patient provided with a list of dentist that accept Medicaid. ?  ? ? ?[name], DO ?[place]  ? ?

## 2021-07-15 ENCOUNTER — Ambulatory Visit (INDEPENDENT_AMBULATORY_CARE_PROVIDER_SITE_OTHER): Payer: Medicaid Other | Admitting: Family Medicine

## 2021-07-15 ENCOUNTER — Other Ambulatory Visit: Payer: Self-pay | Admitting: Family Medicine

## 2021-07-15 ENCOUNTER — Encounter: Payer: Self-pay | Admitting: Family Medicine

## 2021-07-15 ENCOUNTER — Telehealth: Payer: Self-pay | Admitting: Pharmacist

## 2021-07-15 DIAGNOSIS — R196 Halitosis: Secondary | ICD-10-CM | POA: Diagnosis not present

## 2021-07-15 DIAGNOSIS — Z720 Tobacco use: Secondary | ICD-10-CM

## 2021-07-15 NOTE — Assessment & Plan Note (Signed)
Patient contacted for follow/up of tobacco cessation in March 2023. ? ?Since last contact patient reports continues abstinence.  Reports complete cessation since her birthday in late [birth year].  ? ?Medications currently being used;  Bupropion  ? ?Patient denies any significant side effects from tobacco cessation therapy.  ? ?Rates IMPORTANCE of quitting tobacco remains high. ?Rates CONFIDENCE of remaining quit from tobacco as very high.  ? ?Reports breathing and sense of smell improved since quitting.  ? ?Most common triggers to use tobacco include; minimal response to  smoking stimuli recently.  ?

## 2021-07-15 NOTE — Assessment & Plan Note (Signed)
Overdue for dental examination/cleaning. She is on medication for reflux. No symptoms concerning for achalasia, esophageal spasms. No dysphagia. Recent EGD in March notable for nonobstructing Schatzki ring, dilation was performed.  Otherwise, the esophagus appeared normal, stomach was normal. Discussed brushing twice a day (to tongue as well), mouth wash and floss daily. Patient provided with a list of dentist that accept Medicaid. ?

## 2021-07-15 NOTE — Patient Instructions (Addendum)
It was wonderful to see you today. ? ?Please bring ALL of your medications with you to every visit.  ? ?Today we talked about: ? ?-I would recommend going to the dentist since you are overdue for cleanings. ?-Continue brushing twice a day, floss twice a day and use mouthwash nightly. ? ?Thank you for choosing [facility].  ? ?Please call [phone] with any questions about today's appointment. ? ?Please be sure to schedule follow up at the front  desk before you leave today.  ? ?[name], DO ?PGY-2 Family Medicine   ? ?

## 2021-07-15 NOTE — Telephone Encounter (Signed)
Noted and agree. 

## 2021-07-15 NOTE — Telephone Encounter (Signed)
-----   Message from Leavy Cella, [place] sent at 06/29/2021  1:01 PM EDT ----- ?Regarding: quit with bupropion '300mg'$  SR once daily ? ? ?

## 2021-07-15 NOTE — Telephone Encounter (Signed)
Patient contacted for follow/up of tobacco cessation in March 2023. ? ?Since last contact patient reports continues abstinence.  Reports complete cessation since her birthday in late [birth year].  ? ?Medications currently being used;  Bupropion  ? ?Patient denies any significant side effects from tobacco cessation therapy.  ? ?Rates IMPORTANCE of quitting tobacco remains high. ?Rates CONFIDENCE of remaining quit from tobacco as very high.  ? ?Reports breathing and sense of smell improved since quitting.  ? ?Most common triggers to use tobacco include; minimal response to  smoking stimuli recently.  ? ?Total time with patient call and documentation of interaction: 14 minutes. ? ?F/U Phone call planned: 1 month to celebrate 2 month quit. ? ? ?

## 2021-07-23 ENCOUNTER — Ambulatory Visit (INDEPENDENT_AMBULATORY_CARE_PROVIDER_SITE_OTHER): Payer: Medicaid Other

## 2021-07-23 ENCOUNTER — Ambulatory Visit: Payer: Medicaid Other | Admitting: Podiatry

## 2021-07-23 ENCOUNTER — Ambulatory Visit: Payer: Medicaid Other

## 2021-07-23 DIAGNOSIS — M722 Plantar fascial fibromatosis: Secondary | ICD-10-CM | POA: Diagnosis not present

## 2021-07-23 DIAGNOSIS — L280 Lichen simplex chronicus: Secondary | ICD-10-CM | POA: Diagnosis not present

## 2021-07-23 DIAGNOSIS — R591 Generalized enlarged lymph nodes: Secondary | ICD-10-CM

## 2021-07-23 DIAGNOSIS — I890 Lymphedema, not elsewhere classified: Secondary | ICD-10-CM

## 2021-07-23 DIAGNOSIS — L600 Ingrowing nail: Secondary | ICD-10-CM

## 2021-07-23 MED ORDER — CLOBETASOL PROPIONATE 0.05 % EX OINT
1.0000 | TOPICAL_OINTMENT | Freq: Two times a day (BID) | CUTANEOUS | 0 refills | Status: DC
Start: 2021-07-23 — End: 2021-10-12

## 2021-07-23 MED ORDER — METHYLPREDNISOLONE 4 MG PO TBPK: ORAL_TABLET | ORAL | 0 refills | Status: DC

## 2021-07-23 NOTE — Patient Instructions (Signed)

## 2021-07-27 NOTE — Progress Notes (Signed)
  Subjective:  Patient ID: [name], female    DOB: [birth year],  MRN: [id number]  Chief Complaint  Patient presents with   Foot Pain    56 y.o. female presents with the above complaint. History confirmed with patient.  She is multiple issues to discuss.  She says that she think she has a ingrown nail on the left great toe.  She also has heel pain bilaterally on the bottom of the foot but the left is much worse.  She has a history of lymphedema and this causes skin changes specially in the right foot that have been shaved down before.  Objective:  Physical Exam: warm, good capillary refill, no trophic changes or ulcerative lesions, normal DP and PT pulses, normal sensory exam, and significant lymphedema bilateral throughout the lower extremities, there is lichen simplex chronicus on the dorsal right first and second MTPJ's, ingrowing nail medial border left great toe, pain on palpation of the medial band of the plantar fascia and insertion on the heel bilaterally but left is much worse than right   Radiographs: Multiple views x-ray of both feet: no fracture, dislocation, swelling or degenerative changes noted and plantar calcaneal spur Assessment:   1. Lymphedema   2. Ingrowing left great toenail   3. Plantar fasciitis, bilateral   4. Lichen simplex chronicus      Plan:  Patient was evaluated and treated and all questions answered.  Discussed the etiology and treatment options for plantar fasciitis including stretching, formal physical therapy, supportive shoegears such as a running shoe or sneaker, pre fabricated orthoses, injection therapy, and oral medications. We also discussed the role of surgical treatment of this for patients who do not improve after exhausting non-surgical treatment options.   -XR reviewed with patient -Educated patient on stretching and icing of the affected limb -Night splint dispensed -Injection delivered to the plantar fascia of the left foot. -Rx  for medrol pack. Educated on use, risks, and benefits of the medication  After sterile prep with povidone-iodine solution and alcohol, the left heel was injected with 0.5cc 2% xylocaine plain, 0.5cc 0.5% marcaine plain, '5mg'$  triamcinolone acetonide, and '2mg'$  dexamethasone was injected along the medial plantar fascia at the insertion on the plantar calcaneus. The patient tolerated the procedure well without complication.   For ingrown nail removal we discussed partial permanent matricectomy we will complete this at the next visit   The skin lesion on the right foot is related to lichen simplex chronicus secondary to her lymphedema.  We discussed how lymphedema treatment could help with this.  We will send a referral to the lymphedema clinic at [facility].  I debrided the lesions sharply with a scalpel today and recommended corticosteroid treatment and Temovate ointment was prescribed for this.  Use daily.  Return in about 6 weeks (around 09/03/2021) for recheck plantar fasciitis, ingrown nail removal L .

## 2021-08-01 ENCOUNTER — Other Ambulatory Visit: Payer: Self-pay | Admitting: Family Medicine

## 2021-08-01 DIAGNOSIS — E890 Postprocedural hypothyroidism: Secondary | ICD-10-CM

## 2021-08-11 ENCOUNTER — Encounter: Payer: Self-pay | Admitting: *Deleted

## 2021-08-13 ENCOUNTER — Telehealth: Payer: Self-pay | Admitting: Pharmacist

## 2021-08-13 ENCOUNTER — Encounter: Payer: Self-pay | Admitting: Pharmacist

## 2021-08-13 DIAGNOSIS — Z720 Tobacco use: Secondary | ICD-10-CM

## 2021-08-13 NOTE — Assessment & Plan Note (Addendum)
  Patient contacted for follow/up of tobacco cessation attempt.   Since last contact patient reports  complete abstinence  Medications currently being used;  Bupropion - '150mg'$  SR - not taking daily.  Only uses 1 per day when she is having a craving.   Patient denies any significant side effects from tobacco cessation therapy.   Rates IMPORTANCE of quitting tobacco remains high. Rates CONFIDENCE of quitting tobacco remains high. Great candidate for long-term abstinence.  Total time with patient call and documentation of interaction: 11 minutes.  F/U Phone call planned: 1 month (3 month post quit)  plan to recomment stopping bupropion at that time (prescribed by another provider).

## 2021-08-13 NOTE — Telephone Encounter (Signed)
Patient contacted for follow/up of tobacco cessation attempt.   Since last contact patient reports  complete abstinence  Medications currently being used;  Bupropion - '150mg'$  SR - not taking daily.  Only uses 1 per day when she is having a craving.   Patient denies any significant side effects from tobacco cessation therapy.   Rates IMPORTANCE of quitting tobacco remains high. Rates CONFIDENCE of quitting tobacco remains high. Great candidate for long-term abstinence.  Total time with patient call and documentation of interaction: 11 minutes.  F/U Phone call planned: 1 month (3 month post quit)  plan to recomment stopping bupropion at that time (prescribed by another provider).

## 2021-08-13 NOTE — Telephone Encounter (Signed)
Noted and agree. 

## 2021-08-18 ENCOUNTER — Other Ambulatory Visit: Payer: Self-pay | Admitting: Family Medicine

## 2021-08-18 DIAGNOSIS — R591 Generalized enlarged lymph nodes: Secondary | ICD-10-CM

## 2021-08-18 MED ORDER — MELOXICAM 15 MG PO TABS: 15.0000 mg | ORAL_TABLET | Freq: Every day | ORAL | 0 refills | Status: DC

## 2021-08-18 NOTE — Telephone Encounter (Signed)
Called patient to congratulate her!

## 2021-08-18 NOTE — Progress Notes (Signed)
Call patient to congratulate her on her smoking cessation.  She was very happy and proud. She is thankful for the teams support.   She also mentioned ongoing neck swelling and pain. Previously noted on exams. It is painful and bothersome to her- she would like an ultrasound to further investigate. I have ordered one and will send [facility] [employer] a message to help with scheduling.   She mentioned ongoing foot pain. Recently seen by Podiatry and had corticosteroid injection. She was wanting something for the pain. Have sent in short course of Meloxicam to see if that will help.

## 2021-08-25 ENCOUNTER — Telehealth: Payer: Self-pay

## 2021-08-25 NOTE — Telephone Encounter (Signed)
Called and informed patient of upcoming ultrasound at [facility].  Patient states that she has [facility] and will retrieve information from there.  Patient was given  number for [facility]ergy and Ast[facility] clinic for referral from March 2023 as she states that no one has called her.  Ozella Almond, [place]

## 2021-08-31 ENCOUNTER — Ambulatory Visit (HOSPITAL_COMMUNITY): Admission: RE | Admit: 2021-08-31 | Payer: Medicaid Other | Source: Ambulatory Visit

## 2021-09-01 DIAGNOSIS — Z79899 Other long term (current) drug therapy: Secondary | ICD-10-CM | POA: Diagnosis not present

## 2021-09-03 ENCOUNTER — Ambulatory Visit (HOSPITAL_COMMUNITY): Admission: RE | Admit: 2021-09-03 | Payer: Medicaid Other | Source: Ambulatory Visit

## 2021-09-04 ENCOUNTER — Ambulatory Visit (HOSPITAL_COMMUNITY)
Admission: RE | Admit: 2021-09-04 | Discharge: 2021-09-04 | Disposition: A | Discharge status: Home / Self Care | Payer: Medicaid Other | Source: Ambulatory Visit | Attending: Family Medicine | Admitting: Family Medicine

## 2021-09-04 DIAGNOSIS — R591 Generalized enlarged lymph nodes: Secondary | ICD-10-CM | POA: Diagnosis not present

## 2021-09-04 DIAGNOSIS — R590 Localized enlarged lymph nodes: Secondary | ICD-10-CM | POA: Diagnosis not present

## 2021-09-21 ENCOUNTER — Telehealth: Payer: Self-pay | Admitting: Pharmacist

## 2021-09-21 ENCOUNTER — Other Ambulatory Visit: Payer: Self-pay | Admitting: Family Medicine

## 2021-09-21 DIAGNOSIS — Z87891 Personal history of nicotine dependence: Secondary | ICD-10-CM

## 2021-09-21 NOTE — Assessment & Plan Note (Signed)
Patient contacted for follow/up of tobacco cessation successful attempt.  Since last contact 3 months ago patient reports no smoking at all.   Medications currently being used; None. Sopped buporpion.   Continues to rate CONFIDENCE of quitting tobacco as high.   Most common triggers to use tobacco include; "NONE"

## 2021-09-21 NOTE — Telephone Encounter (Signed)
-----   Message from Leavy Cella, [place] sent at 08/13/2021  1:21 PM EDT ----- Regarding: Tobacco 3 month quit - taper off bupropion?

## 2021-09-21 NOTE — Telephone Encounter (Signed)
Patient contacted for follow/up of tobacco cessation successful attempt.  Since last contact 3 months ago patient reports no smoking at all.   Medications currently being used; None. Sopped buporpion.   Continues to rate CONFIDENCE of quitting tobacco as high.   Most common triggers to use tobacco include; "NONE"   Patient is participating in a Managed Medicaid Plan:  Yes  Total time with patient call and documentation of interaction: 12 minutes. Follow-up phone call planned: 3-6 month quit follow-up

## 2021-09-21 NOTE — Telephone Encounter (Signed)
Noted and agree. 

## 2021-10-12 ENCOUNTER — Encounter: Payer: Self-pay | Admitting: Family Medicine

## 2021-10-12 ENCOUNTER — Ambulatory Visit (INDEPENDENT_AMBULATORY_CARE_PROVIDER_SITE_OTHER): Payer: Medicaid Other | Admitting: Family Medicine

## 2021-10-12 VITALS — BP 138/85 | HR 62 | Ht 68.0 in | Wt 247.8 lb

## 2021-10-12 DIAGNOSIS — L301 Dyshidrosis [pompholyx]: Secondary | ICD-10-CM

## 2021-10-12 MED ORDER — HYDROXYZINE HCL 10 MG PO TABS: 10.0000 mg | ORAL_TABLET | Freq: Every evening | ORAL | 0 refills | Status: DC | PRN

## 2021-10-12 MED ORDER — CLOBETASOL PROPIONATE 0.05 % EX OINT
1.0000 | TOPICAL_OINTMENT | Freq: Two times a day (BID) | CUTANEOUS | 0 refills | Status: DC
Start: 2021-10-12 — End: 2022-01-18

## 2021-10-12 NOTE — Patient Instructions (Signed)
I am sending in a prescription for the clobetasol ointment, this will be the only thing you want to use on your hands for the next 2 weeks.  You can use this twice daily for 2 weeks and then back off to once daily.  You can eventually use it as needed for when these lesions pop back up.  I am sending in hydroxyzine to take at night to help with the itching.  You can also use gloves at night with the ointment as some people find some benefit with that.

## 2021-10-12 NOTE — Progress Notes (Signed)
    SUBJECTIVE:   CHIEF COMPLAINT / HPI:   Rash on hands - Patient unsure of what the rash is - Does note that she has been hospitalized twice in the past for "full body yeast infection". - Reports taking clobetasol 4-5 times per day (though reports it as a cream not an ointment) - Has mainly itching with this, no bleeding from the rash  PERTINENT  PMH / PSH: Reviewed  OBJECTIVE:   BP 138/85   Pulse 62   Ht '5\' 8"'$  (1.727 m)   Wt 247 lb 12.8 oz (112.4 kg)   SpO2 100%   BMI 37.68 kg/m   General: NAD, well-appearing, well-nourished Respiratory: No respiratory distress, breathing comfortably, able to speak in full sentences Skin: Warm and dry, lesions noted on multiple fingers of the right and left hands as pictured below   ASSESSMENT/PLAN:   Dyshidrotic eczema Rash that is present on the bilateral hands is most consistent with dyshidrotic eczema, was examined with Dr. Erin Hearing.  Unclear if patient was truly taking clobetasol ointment due to the description of the cream patient was using.  Did consider obtaining RPR, but given history of similar presentation and during pathology in 2011, will hold off on evaluation for that. - Clobetasol prescribed twice daily x14 days - Follow-up in 1 month with PCP - Hydroxyzine 25 mg nightly for itching - Discussed with patient that this is not a cure, likely will recur   Rise Patience, [place]

## 2021-10-13 ENCOUNTER — Other Ambulatory Visit: Payer: Self-pay | Admitting: Family Medicine

## 2021-10-13 ENCOUNTER — Other Ambulatory Visit: Payer: Self-pay

## 2021-10-13 MED ORDER — MAGIC MOUTHWASH W/LIDOCAINE: 5.0000 mL | Freq: Four times a day (QID) | ORAL | 1 refills | Status: DC | PRN

## 2021-10-13 MED ORDER — MAGIC MOUTHWASH W/LIDOCAINE
5.0000 mL | Freq: Four times a day (QID) | ORAL | 1 refills | Status: DC | PRN
Start: 2021-10-13 — End: 2021-10-13

## 2021-10-13 NOTE — Telephone Encounter (Signed)
Prescription did not electronically go through to pharmacy. Called pharmacy to provide verbal.   Pharmacist will need clarification on prescription.   - Is this for the standard magic mouth wash (hydrocortisone, nystatin and benadryl)?  - What is the ratio for lidocaine? (Typically 1:1)  Clarification will need to be called into the pharmacy.   Please advise.   Talbot Grumbling, RN

## 2021-10-13 NOTE — Telephone Encounter (Signed)
Called pharmacist and provided with verbal order per Dr. Nita Sells.   Talbot Grumbling, RN

## 2021-10-13 NOTE — Addendum Note (Signed)
Addended by: Talbot Grumbling on: 10/13/2021 11:47 AM   Modules accepted: Orders

## 2021-10-30 ENCOUNTER — Other Ambulatory Visit: Payer: Self-pay | Admitting: Student in an Organized Health Care Education/Training Program

## 2021-10-30 ENCOUNTER — Other Ambulatory Visit: Payer: Self-pay | Admitting: Family Medicine

## 2021-10-30 DIAGNOSIS — E890 Postprocedural hypothyroidism: Secondary | ICD-10-CM

## 2021-11-06 ENCOUNTER — Other Ambulatory Visit: Payer: Self-pay | Admitting: Family Medicine

## 2021-11-06 DIAGNOSIS — G629 Polyneuropathy, unspecified: Secondary | ICD-10-CM

## 2021-11-09 NOTE — Patient Instructions (Addendum)
It was wonderful to see you today.  Please bring ALL of your medications with you to every visit.   Today we talked about:  We are doing lab work today to check your thyroid, cholesterol, electrolytes and kidney function. I will send you a MyChart message if you have MyChart. Otherwise, I will give you a call for abnormal results or send a letter if everything returned back normal. If you don't hear from me in 2 weeks, please call the office.    -I am sending in Hydroxyzine for you to take as needed for anxiety. -I refilled your Lexapro. This is for anxiety and depression and you should take this daily. -I have refilled your Lyrica. -I will send in a medication for weight loss called Semaglutide. We'll see if your insurance covers this or not. Take it once a week. We talked about the most common side effects. We can increase the dose every 4 weeks if you are doing well on it.   Thank you for choosing [facility].   Please call [phone] with any questions about today's appointment.  Please be sure to schedule follow up at the front  desk before you leave today.   [name], DO PGY-3 Family Medicine

## 2021-11-09 NOTE — Progress Notes (Signed)
    SUBJECTIVE:   CHIEF COMPLAINT / HPI:   [name] is a 56 y.o. female who presents to the [facility] clinic today to discuss the following concerns:   Discuss Weight Loss Medication Would like to start on medication for weight loss. Feels that this will help with her back pain- take some of the pressure off. Her back pain limits her exercise. She feels like she binge eats- mostly at dinner time. She typically has a smaller breakfast and lunch. Lots of snacking in between meals. Mostly drinks water and coffee. Denies history pancreatitis, medullary thyroid cancer, MEN Type 2.   Anxiety and Depression States that sometimes she has episodes of increased anxiety. No specific triggers. Would like a medication to use as needed during these times.   Hypothyroidism Taking Synthroid 150 mcg. Last TSH in 08/2020 was 0.918.  Hyperlipidemia Last lipid panel in 08/2020 showed LDL 141, total cholesterol 213. On Pravastatin 40 mg.    PERTINENT  PMH / PSH: Lymphedema, GERD, hypothyroidism, chronic pain   OBJECTIVE:   BP 130/86   Pulse 67   Ht '5\' 8"'$  (1.727 m)   Wt 244 lb 9.6 oz (110.9 kg)   SpO2 100%   BMI 37.19 kg/m   Vitals:   11/10/21 1042 11/10/21 1110  BP: (!) 148/100 130/86  Pulse: 67   SpO2: 100%    General: NAD, pleasant, able to participate in exam Respiratory: Normal respiratory effort on room air Abdomen: Obese Extremities: lymphedema b/l Psych: Normal affect and mood     11/10/2021   11:08 AM 10/12/2021   10:08 AM 07/15/2021   10:28 AM  Depression screen PHQ 2/9  Decreased Interest '3 3 1  '$ Down, Depressed, Hopeless '3 2 1  '$ PHQ - 2 Score '6 5 2  '$ Altered sleeping 1 2 0  Tired, decreased energy '3 3 3  '$ Change in appetite '3 3 3  '$ Feeling bad or failure about yourself  1 1 0  Trouble concentrating '2 2 2  '$ Moving slowly or fidgety/restless 0 0 0  Suicidal thoughts 0 0 0  PHQ-9 Score '16 16 10    '$ ASSESSMENT/PLAN:   Hypothyroidism Checking TSH today. Continue Synthroid- will  adjust dosage as needed.   Hyperlipidemia Checking lipid panel today.  Currently taking pravastatin 40 mg daily.  Pending lipid panel and ASCVD risk, may need to switch statins, patient is aware.   Anxiety and depression PHQ-9 today 16- stable from last month. Refilled Lexapro.  Have sent in hydroxyzine 50 mg to use twice daily as needed for anxiety. Recommend she go back to therapy/counseling.   Obesity (BMI 30-39.9) BMI is 37.  She would benefit from weight loss. Discussed common side effects of GLP-1. She has no contraindications to use.  -Rx Semaglutide 0.25 mg/week; will likely need PA  -Increase dose every 4 weeks pending tolerance  -F/u in 1 month   Overactive bladder Reports increased urinary frequency. UA today was negative for infection. Does not have diabetes. Previously referred to Urology in 2021. Has declined pelvic floor PT in the past.      [name], [place]

## 2021-11-10 ENCOUNTER — Other Ambulatory Visit (HOSPITAL_COMMUNITY): Payer: Self-pay

## 2021-11-10 ENCOUNTER — Encounter: Payer: Self-pay | Admitting: Family Medicine

## 2021-11-10 ENCOUNTER — Ambulatory Visit (INDEPENDENT_AMBULATORY_CARE_PROVIDER_SITE_OTHER): Payer: Medicaid Other | Admitting: Family Medicine

## 2021-11-10 VITALS — BP 130/86 | HR 67 | Ht 68.0 in | Wt 244.6 lb

## 2021-11-10 DIAGNOSIS — E669 Obesity, unspecified: Secondary | ICD-10-CM

## 2021-11-10 DIAGNOSIS — I890 Lymphedema, not elsewhere classified: Secondary | ICD-10-CM

## 2021-11-10 DIAGNOSIS — R350 Frequency of micturition: Secondary | ICD-10-CM | POA: Diagnosis not present

## 2021-11-10 DIAGNOSIS — E890 Postprocedural hypothyroidism: Secondary | ICD-10-CM

## 2021-11-10 DIAGNOSIS — F419 Anxiety disorder, unspecified: Secondary | ICD-10-CM

## 2021-11-10 DIAGNOSIS — F32A Depression, unspecified: Secondary | ICD-10-CM

## 2021-11-10 DIAGNOSIS — G629 Polyneuropathy, unspecified: Secondary | ICD-10-CM

## 2021-11-10 DIAGNOSIS — N3281 Overactive bladder: Secondary | ICD-10-CM | POA: Diagnosis not present

## 2021-11-10 DIAGNOSIS — E785 Hyperlipidemia, unspecified: Secondary | ICD-10-CM

## 2021-11-10 DIAGNOSIS — G8929 Other chronic pain: Secondary | ICD-10-CM

## 2021-11-10 LAB — POCT URINALYSIS DIP (MANUAL ENTRY)
Bilirubin, UA: NEGATIVE
Blood, UA: NEGATIVE
Glucose, UA: NEGATIVE mg/dL
Ketones, POC UA: NEGATIVE mg/dL
Leukocytes, UA: NEGATIVE
Nitrite, UA: NEGATIVE
Protein Ur, POC: NEGATIVE mg/dL
Spec Grav, UA: 1.02 (ref 1.010–1.025)
Urobilinogen, UA: 0.2 E.U./dL
pH, UA: 5.5 (ref 5.0–8.0)

## 2021-11-10 LAB — POCT GLYCOSYLATED HEMOGLOBIN (HGB A1C): Hemoglobin A1C: 5.2 % (ref 4.0–5.6)

## 2021-11-10 MED ORDER — ESCITALOPRAM OXALATE 10 MG PO TABS: 10.0000 mg | ORAL_TABLET | Freq: Every day | ORAL | 3 refills | Status: DC

## 2021-11-10 MED ORDER — SEMAGLUTIDE(0.25 OR 0.5MG/DOS) 2 MG/1.5ML ~~LOC~~ SOPN: 0.2500 mg | PEN_INJECTOR | SUBCUTANEOUS | 1 refills | Status: DC

## 2021-11-10 MED ORDER — HYDROXYZINE HCL 50 MG PO TABS: 50.0000 mg | ORAL_TABLET | Freq: Three times a day (TID) | ORAL | 1 refills | Status: DC | PRN

## 2021-11-10 MED ORDER — PREGABALIN 150 MG PO CAPS: 150.0000 mg | ORAL_CAPSULE | Freq: Two times a day (BID) | ORAL | 1 refills | Status: DC

## 2021-11-10 NOTE — Assessment & Plan Note (Addendum)
PHQ-9 today 16- stable from last month. Refilled Lexapro.  Have sent in hydroxyzine 50 mg to use twice daily as needed for anxiety. Recommend she go back to therapy/counseling.

## 2021-11-10 NOTE — Assessment & Plan Note (Addendum)
Reports increased urinary frequency. UA today was negative for infection. Does not have diabetes. Previously referred to Urology in 2021. Has declined pelvic floor PT in the past.

## 2021-11-10 NOTE — Assessment & Plan Note (Signed)
Checking TSH today. Continue Synthroid- will adjust dosage as needed.

## 2021-11-10 NOTE — Assessment & Plan Note (Signed)
BMI is 37.  She would benefit from weight loss. Discussed common side effects of GLP-1. She has no contraindications to use.  -Rx Semaglutide 0.25 mg/week; will likely need PA  -Increase dose every 4 weeks pending tolerance  -F/u in 1 month

## 2021-11-10 NOTE — Addendum Note (Signed)
Addended by: [place] Pink on: 11/10/2021 01:48 PM   Modules accepted: Orders

## 2021-11-10 NOTE — Assessment & Plan Note (Signed)
Checking lipid panel today.  Currently taking pravastatin 40 mg daily.  Pending lipid panel and ASCVD risk, may need to switch statins, patient is aware.

## 2021-11-11 LAB — LIPID PANEL
Chol/HDL Ratio: 2.9 ratio (ref 0.0–4.4)
Cholesterol, Total: 232 mg/dL — ABNORMAL HIGH (ref 100–199)
HDL: 81 mg/dL (ref >39)
LDL Chol Calc (NIH): 137 mg/dL — ABNORMAL HIGH (ref 0–99)
Triglycerides: 82 mg/dL (ref 0–149)
VLDL Cholesterol Cal: 14 mg/dL (ref 5–40)

## 2021-11-11 LAB — BASIC METABOLIC PANEL
BUN/Creatinine Ratio: 10 (ref 9–23)
BUN: 10 mg/dL (ref 6–24)
CO2: 25 mmol/L (ref 20–29)
Calcium: 9.2 mg/dL (ref 8.7–10.2)
Chloride: 106 mmol/L (ref 96–106)
Creatinine, Ser: 1.03 mg/dL — ABNORMAL HIGH (ref 0.57–1.00)
Glucose: 91 mg/dL (ref 70–99)
Potassium: 4.4 mmol/L (ref 3.5–5.2)
Sodium: 143 mmol/L (ref 134–144)
eGFR: 64 mL/min/{1.73_m2} (ref >59)

## 2021-11-11 LAB — TSH: TSH: 3.75 u[IU]/mL (ref 0.450–4.500)

## 2021-11-12 ENCOUNTER — Telehealth: Payer: Self-pay

## 2021-11-12 NOTE — Telephone Encounter (Signed)
Prior Auth for patients medication OZEMPIC denied by [employer] via CoverMyMeds.   Reason:   CoverMyMeds Key: [id number]

## 2021-11-12 NOTE — Telephone Encounter (Signed)
A Prior Authorization was initiated for this patients OZEMPIC through CoverMyMeds.   Key: [id number]

## 2021-11-29 ENCOUNTER — Other Ambulatory Visit: Payer: Self-pay | Admitting: Family Medicine

## 2021-11-29 NOTE — Progress Notes (Deleted)
    SUBJECTIVE:   CHIEF COMPLAINT / HPI:   [name] is a 56 y.o. female who presents to the [facility] clinic today to discuss the following concerns:   Follow Up Weight GLP-1 was denied by her insurance.   PERTINENT  PMH / PSH: Hypothyroidism, anxiety and depression  OBJECTIVE:   There were no vitals taken for this visit. ***  General: NAD, pleasant, able to participate in exam Cardiac: RRR, no murmurs. Respiratory: CTAB, normal effort, No wheezes, rales or rhonchi Abdomen: Bowel sounds present, nontender, nondistended, no hepatosplenomegaly. Extremities: no edema or cyanosis. Skin: warm and dry, no rashes noted Neuro: alert, no obvious focal deficits Psych: Normal affect and mood  ASSESSMENT/PLAN:   No problem-specific Assessment & Plan notes found for this encounter.     [name], [place]

## 2021-11-30 ENCOUNTER — Other Ambulatory Visit: Payer: Self-pay | Admitting: Family Medicine

## 2021-12-03 ENCOUNTER — Ambulatory Visit: Payer: Self-pay | Admitting: Family Medicine

## 2021-12-17 ENCOUNTER — Telehealth: Payer: Self-pay

## 2021-12-17 NOTE — Progress Notes (Deleted)
    SUBJECTIVE:   CHIEF COMPLAINT / HPI:   [name] is a 56 y.o. female who presents to the [facility] clinic today to discuss the following concerns:   Follow Up Weight GLP-1 was denied by her insurance.   Health Maintenance Due for Pap smear, mammogram, influenza vaccine and COVID booster.   PERTINENT  PMH / PSH: Hypothyroidism, anxiety and depression  OBJECTIVE:   There were no vitals taken for this visit. ***  General: NAD, pleasant, able to participate in exam Cardiac: RRR, no murmurs. Respiratory: CTAB, normal effort, No wheezes, rales or rhonchi Abdomen: Bowel sounds present, nontender, nondistended, no hepatosplenomegaly. Extremities: no edema or cyanosis. Skin: warm and dry, no rashes noted Neuro: alert, no obvious focal deficits Psych: Normal affect and mood  ASSESSMENT/PLAN:   No problem-specific Assessment & Plan notes found for this encounter.     [name], [place]

## 2021-12-17 NOTE — Telephone Encounter (Signed)
Patient calls nurse line requesting to speak with Dr. Nita Sells. She reports that she fell last night in the kitchen and landed on her tail bone. She denies LOC or head injury. She was requesting pain medication. Advised that she would need to be seen and that provider would be unable to send in pain meds without appointment. Recommended that patient go to UC for further evaluation, as we do not have any appointments until next week.   Patient voices understanding.   She also was requesting to switch from Ozempic to Trulicity. Trulicity is preferred medication through insurance. Advised that I would forward message to provider, however, she may want to discuss medication change at upcoming appointment on 10/17.  Forwarding to PCP.   Talbot Grumbling, RN

## 2021-12-22 ENCOUNTER — Ambulatory Visit: Payer: Medicaid Other | Admitting: Family Medicine

## 2021-12-23 ENCOUNTER — Telehealth: Payer: Self-pay | Admitting: Family Medicine

## 2021-12-23 ENCOUNTER — Ambulatory Visit: Payer: Medicaid Other

## 2021-12-23 NOTE — Telephone Encounter (Signed)
Patient called to cancel her appointment today 12/23/21. She wanted to let Dr. Valrie Hart know that she apologizes for not coming in but she is in so much pain it is hard to walk. She will come when she is able to get around better.

## 2021-12-30 ENCOUNTER — Telehealth: Payer: Self-pay | Admitting: Pharmacist

## 2021-12-30 NOTE — Telephone Encounter (Signed)
Patient contacted for follow/up of tobacco cessation attempt.   Since last contact patient reports continued abstinence from smoking.   Medications currently being used; None Off bupropion  Patient denies any significant side effects from tobacco cessation therapy.  Continues to rate CONFIDENCE of quitting tobacco as high.   Motivation to remain quit: Breathing Reports dyspnea routinely despite use of Spiriva (tiotropium)  Patient has Appointment with PCP in 2 weeks.   Consider Spirometry evaluation AND option of using LABA/LAMA therapy  Patient is participating in a Managed Medicaid Plan:  Yes  Total time with patient call and documentation of interaction: 12 minutes. Follow-up phone call planned: 6 months at 12 month quit date.

## 2021-12-30 NOTE — Telephone Encounter (Signed)
-----   Message from Leavy Cella, [place] sent at 09/21/2021  3:56 PM EDT ----- Regarding: ^ month tobacco Cessation

## 2022-01-12 ENCOUNTER — Encounter: Payer: Self-pay | Admitting: Family Medicine

## 2022-01-12 ENCOUNTER — Ambulatory Visit: Payer: Medicaid Other | Admitting: Family Medicine

## 2022-01-12 ENCOUNTER — Ambulatory Visit (INDEPENDENT_AMBULATORY_CARE_PROVIDER_SITE_OTHER): Payer: Medicaid Other | Admitting: Family Medicine

## 2022-01-12 VITALS — BP 123/88 | HR 54 | Ht 68.0 in | Wt 241.6 lb

## 2022-01-12 DIAGNOSIS — M722 Plantar fascial fibromatosis: Secondary | ICD-10-CM | POA: Diagnosis present

## 2022-01-12 DIAGNOSIS — M545 Low back pain, unspecified: Secondary | ICD-10-CM

## 2022-01-12 MED ORDER — KETOROLAC TROMETHAMINE 30 MG/ML IJ SOLN
30.0000 mg | Freq: Once | INTRAMUSCULAR | Status: AC
  Administered 2022-01-12: 30 mg via INTRAMUSCULAR

## 2022-01-12 NOTE — Assessment & Plan Note (Signed)
Has received injection in the past which offered temporary relief. Previously tried stretches at home. Discussed PT to help with recovery and provided with information on AVS to look up night splints as well as more information on plantar fasciitis. Discussed she could return to see Podiatry to discuss surgical options if symptoms do not improve with conservative measures.

## 2022-01-12 NOTE — Patient Instructions (Signed)
It was wonderful to see you today.  Please bring ALL of your medications with you to every visit.   Today we talked about:  -I have sent in a referral to Physical Therapy to help with your plantar fasciitis and your back pain. -If you want to go on [place] you can search Plantar Fasciitis night splint and try one of those splints we talked about to see if it will help.  -You can continue Tylenol every 6 hours for the pain. You can use lidocaine patches. You can use heating pads and Voltaren gel to areas of pain- just dont do these at the same time. -You received an injection of a pain medication called Toradol today. Do not take any more anti-inflammatory medication (such as Motrin, Ibuprofen, Alleve) for the next 6 hours.  Thank you for coming to your visit as scheduled. We have had a large "no-show" problem lately, and this significantly limits our ability to see and care for patients. As a friendly reminder- if you cannot make your appointment please call to cancel. We do have a no show policy for those who do not cancel within 24 hours. Our policy is that if you miss or fail to cancel an appointment within 24 hours, 3 times in a 17-monthperiod, you may be dismissed from our clinic.   Thank you for choosing [facility]   Please call [phone] any questions about today's appointment.  Please be sure to schedule follow up at the front  desk before you leave today.   ASharion Settler DO PGY-3 Family Medicine     Plantar Fasciitis  Plantar fasciitis is a painful foot condition that affects the heel. It occurs when the band of tissue that connects the toes to the heel bone (plantar fascia) becomes irritated. This can happen as the result of exercising too much or doing other repetitive activities (overuse injury). Plantar fasciitis can cause mild irritation to severe pain that makes it difficult to walk or move. The pain is usually worse in the morning after  sleeping, or after sitting or lying down for a period of time. Pain may also be worse after long periods of walking or standing. What are the causes? This condition may be caused by: Standing for long periods of time. Wearing shoes that do not have good arch support. Doing activities that put stress on joints (high-impact activities). This includes ballet and exercise that makes your heart beat faster (aerobic exercise), such as running. Being overweight. An abnormal way of walking (gait). Tight muscles in the back of your lower leg (calf). High arches in your feet or flat feet. Starting a new athletic activity. What are the signs or symptoms? The main symptom of this condition is heel pain. Pain may get worse after the following: Taking the first steps after a time of rest, especially in the morning after awakening, or after you have been sitting or lying down for a while. Long periods of standing still. Pain may decrease after 30-45 minutes of activity, such as gentle walking. How is this diagnosed? This condition may be diagnosed based on your medical history, a physical exam, and your symptoms. Your health care provider will check for: A tender area on the bottom of your foot. A high arch in your foot or flat feet. Pain when you move your foot. Difficulty moving your foot. You may have imaging tests to confirm the diagnosis, such as: X-rays. Ultrasound. MRI. How is this treated? Treatment for  plantar fasciitis depends on how severe your condition is. Treatment may include: Rest, ice, pressure (compression), and raising (elevating) the affected foot. This is called RICE therapy. Your health care provider may recommend RICE therapy along with over-the-counter pain medicines to manage your pain. Exercises to stretch your calves and your plantar fascia. A splint that holds your foot in a stretched, upward position while you sleep (night splint). Physical therapy to relieve symptoms and  prevent problems in the future. Injections of steroid medicine (cortisone) to relieve pain and inflammation. Stimulating your plantar fascia with electrical impulses (extracorporeal shock wave therapy). This is usually the last treatment option before surgery. Surgery, if other treatments have not worked after 12 months. Follow these instructions at home: Managing pain, stiffness, and swelling  If directed, put ice on the painful area. To do this: Put ice in a plastic bag, or use a frozen bottle of water. Place a towel between your skin and the bag or bottle. Roll the bottom of your foot over the bag or bottle. Do this for 20 minutes, 2-3 times a day. Wear athletic shoes that have air-sole or gel-sole cushions, or try soft shoe inserts that are designed for plantar fasciitis. Elevate your foot above the level of your heart while you are sitting or lying down. Activity Avoid activities that cause pain. Ask your health care provider what activities are safe for you. Do physical therapy exercises and stretches as told by your health care provider. Try activities and forms of exercise that are easier on your joints (low impact). Examples include swimming, water aerobics, and biking. General instructions Take over-the-counter and prescription medicines only as told by your health care provider. Wear a night splint while sleeping, if told by your health care provider. Loosen the splint if your toes tingle, become numb, or turn cold and blue. Maintain a healthy weight, or work with your health care provider to lose weight as needed. Keep all follow-up visits. This is important. Contact a health care provider if you have: Symptoms that do not go away with home treatment. Pain that gets worse. Pain that affects your ability to move or do daily activities. Summary Plantar fasciitis is a painful foot condition that affects the heel. It occurs when the band of tissue that connects the toes to the  heel bone (plantar fascia) becomes irritated. Heel pain is the main symptom of this condition. It may get worse after exercising too much or standing still for a long time. Treatment varies, but it usually starts with rest, ice, pressure (compression), and raising (elevating) the affected foot. This is called RICE therapy. Over-the-counter medicines can also be used to manage pain. This information is not intended to replace advice given to you by your health care provider. Make sure you discuss any questions you have with your health care provider. Document Revised: 06/11/2019 Document Reviewed: 06/11/2019 Elsevier Patient Education  [place].

## 2022-01-12 NOTE — Assessment & Plan Note (Addendum)
Ongoing x 3 weeks. Occurred after a twisting motion. Suspect muscular etiology. She did appear to have pain out of proportion to examination. She had very limited AROM 2/2 to pain. She reported she was unable to lift her leg and she did request to be wheeled into the room and wheeled out, however, it should also be noted that the patient was able to drive herself to the appointment, get out of her car and walk into the clinic prior to being placed in wheelchair. She received a dose of 30 mg Toradol prior to leaving the office today. We also discussed conservative measures for her pain including voltaren gel, lidocaine patches, heating pads, Tylenol. Also discussed how activity will help the healing process. Referral placed for PT.

## 2022-01-12 NOTE — Assessment & Plan Note (Signed)
>>  ASSESSMENT AND PLAN FOR LOW BACK PAIN WRITTEN ON 01/12/2022  2:35 PM BY ESPINOZA, ALEJANDRA, DO  Ongoing x 3 weeks. Occurred after a twisting motion. Suspect muscular etiology. She did appear to have pain out of proportion to examination. She had very limited AROM 2/2 to pain. She reported she was unable to lift her leg and she did request to be wheeled into the room and wheeled out, however, it should also be noted that the patient was able to drive herself to the appointment, get out of her car and walk into the clinic prior to being placed in wheelchair. She received a dose of 30 mg Toradol prior to leaving the office today. We also discussed conservative measures for her pain including voltaren gel, lidocaine patches, heating pads, Tylenol. Also discussed how activity will help the healing process. Referral placed for PT.

## 2022-01-12 NOTE — Addendum Note (Signed)
Addended by: Londell Moh T on: 01/12/2022 03:01 PM   Modules accepted: Orders

## 2022-01-12 NOTE — Progress Notes (Signed)
    SUBJECTIVE:   CHIEF COMPLAINT / HPI:   [name] is a 56 y.o. female who presents to the [facility] clinic today to discuss the following concerns:   Heel Pain States that she was previously seen by Podiatry in May and has had steroid shot in the left heel. She states she had relief for about a week with this. Patient states that the pain has continued. She has had difficulty walking due to the pain. She has been taking Tylenol for the pain but she states it is making her nauseous. She feels that the Tylenol is aggravating her gastritis.   Back Pain Has had lower back pain since October. States that she twisted her back. No falls. States that her movement has been limited due to pain. She denies any bowel incontinence. She has had accidents with voiding but only because she could not make it to the bathroom in time. No saddle anesthesia. No fevers or IVDU. No hx of previous back surgeries. 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   PERTINENT  PMH / PSH: Lymphedema, chronic pain  OBJECTIVE:   BP 123/88   Pulse (!) 54   Ht '5\' 8"'$  (1.727 m)   Wt 241 lb 9.6 oz (109.6 kg)   SpO2 99%   BMI 36.74 kg/m    General: NAD, pleasant, able to participate in exam Respiratory: normal effort MSK: Pain to lower lumbar spine with minimal palpation. Limited AROM in all directions 2/2 discomfort. No deformities.  Extremities: Lymphedema b/lw lower extremity  Left Heel: Pain to heel and plantar fascia  Psych: Normal affect and mood  ASSESSMENT/PLAN:   Plantar fasciitis of left foot Has received injection in the past which offered temporary relief. Previously tried stretches at home. Discussed PT to help with recovery and provided with information on AVS to look up night splints as well as more information on plantar fasciitis. Discussed she could return to see Podiatry to discuss surgical options if symptoms do not improve with  conservative measures.  Low back pain Ongoing x 3 weeks. Occurred after a twisting motion. Suspect muscular etiology. She did appear to have pain out of proportion to examination. She had very limited AROM 2/2 to pain. She reported she was unable to lift her leg and she did request to be wheeled into the room and wheeled out, however, it should also be noted that the patient was able to drive herself to the appointment, get out of her car and walk into the clinic prior to being placed in wheelchair. She received a dose of 30 mg Toradol prior to leaving the office today. We also discussed conservative measures for her pain including voltaren gel, lidocaine patches, heating pads, Tylenol. Also discussed how activity will help the healing process. Referral placed for PT.     [name], [place]

## 2022-01-17 ENCOUNTER — Other Ambulatory Visit: Payer: Self-pay | Admitting: Family Medicine

## 2022-03-29 ENCOUNTER — Ambulatory Visit (INDEPENDENT_AMBULATORY_CARE_PROVIDER_SITE_OTHER): Payer: Medicaid Other | Admitting: Podiatry

## 2022-03-29 ENCOUNTER — Encounter: Payer: Self-pay | Admitting: Podiatry

## 2022-03-29 VITALS — BP 136/76

## 2022-03-29 DIAGNOSIS — L600 Ingrowing nail: Secondary | ICD-10-CM

## 2022-03-29 NOTE — Progress Notes (Signed)
Subjective:  Patient ID: [name], female    DOB: [birth year],   MRN: [id number]  Chief Complaint  Patient presents with   Ingrown Toenail    Left hallux possible ingrown     57 y.o. female presents for cocnern of left hallux ingrown that has been present for several months. Was seen by Dr. Sherryle Lis in past and suggested to have a procedure. Is ready to talk more about this today.  Denies any other pedal complaints. Denies n/v/f/c.   Past Medical History:  Diagnosis Date   Allergy    Anxiety    Arthritis    knees,hands, hip  pt. Denies   Cellulitis    Colon polyps 2013   SESSIL SERRATED ADENOMA (ALL FRAGMENTS)   Complication of anesthesia    Lowered BP had to keep pt. overnight   COPD (chronic obstructive pulmonary disease) (HCC)    Depression    Fibroadenosis breast    GERD (gastroesophageal reflux disease)    does not take med   Heart murmur    as child  pt. denies   Hyperlipidemia    no meds - diet controlled   Hypothyroidism    Lymph edema    both legs and feet   Migraine    Plantar fasciitis    Syphilis Treated in early 2012 during hospitalization   Uterine fibroid    Varicose veins     Objective:  Physical Exam: Vascular: DP/PT pulses 2/4 bilateral. CFT <3 seconds. Normal hair growth on digits. No edema.  Skin. No lacerations or abrasions bilateral feet. Incurvation of medial border of left hallux No erythema edema or purulence noted.  Musculoskeletal: MMT 5/5 bilateral lower extremities in DF, PF, Inversion and Eversion. Deceased ROM in DF of ankle joint.  Neurological: Sensation intact to light touch.   Assessment:  No diagnosis found.   Plan:  Patient was evaluated and treated and all questions answered. Patient requesting removal of ingrown nail today. Procedure below.  Discussed procedure and post procedure care and patient expressed understanding.  Will follow-up in 2 weeks for nail check or sooner if any problems arise.    Procedure:   Procedure: partial Nail Avulsion of left hallux medial nail border.  Surgeon: [name], DPM  Pre-op Dx: Ingrown toenail without infection Post-op: Same  Place of Surgery: Office exam room.  Indications for surgery: Painful and ingrown toenail.    The patient is requesting removal of nail with chemical matrixectomy. Risks and complications were discussed with the patient for which they understand and written consent was obtained. Under sterile conditions a total of 3 mL of  1% lidocaine plain was infiltrated in a hallux block fashion. Once anesthetized, the skin was prepped in sterile fashion. A tourniquet was then applied. Next the medial aspect of hallux nail border was then sharply excised making sure to remove the entire offending nail border.  Next phenol was then applied under standard conditions and copiously irrigated. Silvadene was applied. A dry sterile dressing was applied. After application of the dressing the tourniquet was removed and there is found to be an immediate capillary refill time to the digit. The patient tolerated the procedure well without any complications. Post procedure instructions were discussed the patient for which he verbally understood. Follow-up in two weeks for nail check or sooner if any problems are to arise. Discussed signs/symptoms of infection and directed to call the office immediately should any occur or go directly to the emergency room. In the meantime, encouraged  to call the office with any questions, concerns, changes symptoms.   [name], DPM

## 2022-03-29 NOTE — Patient Instructions (Signed)

## 2022-04-05 ENCOUNTER — Other Ambulatory Visit: Payer: Self-pay | Admitting: Family Medicine

## 2022-04-12 ENCOUNTER — Ambulatory Visit: Payer: Medicaid Other | Admitting: Podiatry

## 2022-04-18 ENCOUNTER — Other Ambulatory Visit: Payer: Self-pay | Admitting: Family Medicine

## 2022-04-19 ENCOUNTER — Other Ambulatory Visit: Payer: Self-pay | Admitting: Family Medicine

## 2022-04-26 NOTE — Telephone Encounter (Signed)
Open in error

## 2022-05-07 ENCOUNTER — Other Ambulatory Visit: Payer: Self-pay | Admitting: Family Medicine

## 2022-05-07 DIAGNOSIS — G629 Polyneuropathy, unspecified: Secondary | ICD-10-CM

## 2022-05-07 MED ORDER — QUETIAPINE FUMARATE 25 MG PO TABS: 50.0000 mg | ORAL_TABLET | Freq: Every day | ORAL | 1 refills | Status: DC

## 2022-05-22 ENCOUNTER — Telehealth: Payer: Self-pay | Admitting: Gastroenterology

## 2022-05-22 NOTE — Telephone Encounter (Signed)
Patient of Dr. Vena Rua.  Has not been seen in a year.  Calling complaining of gas pain up under her ribs that she thinks is causing her to be short of breath.  Then she talks about using prune juice to help her move her bowels, which is helping.  Is on her Dexilant for acid reflux and has been taking Tums.  Asking if I can get her a walk-in appointment to be seen in our office on Monday morning.  Advised her to try some Gas-X if she truly does think it is gas pain.  She says that all of this has been going on for a few weeks.  Advised that if the shortness of breath or abdominal pain worsens then she should seek evaluation in urgent care or ER.  Otherwise I would have our office reach out to her on Monday to see what they can do about getting her an appointment to be seen and assessed in the office at some point.

## 2022-05-24 NOTE — Telephone Encounter (Signed)
Patient scheduled for a Ov with a PA

## 2022-05-26 ENCOUNTER — Ambulatory Visit: Payer: Medicaid Other | Admitting: Physician Assistant

## 2022-05-30 ENCOUNTER — Telehealth: Payer: Self-pay | Admitting: Gastroenterology

## 2022-05-30 NOTE — Telephone Encounter (Signed)
Received call from patient this evening. Promptly returned the call to patient. She states that she has been experiencing progressive chest pain, arm pain, and shortness of breath over the last month. She had a GI clinic appointment earlier but cancelled and rescheduled when she was feeling better. She has not discussed this with her PCP. She feels Bean-O has helped somewhat for short period of time. She remains on PPI therapy. Over night, this is concerning for possible Cardiac issues, that require additional workup/management. She states she "knows this is not a heart attack." I told her that I would recommend a ED/Urgent care evaluation or PCP evaluation prior to further GI workup when someone has these types of symptoms. Other issues such as Gallbladder or Pancreas can cause abdominal pain but shouldn't be causing chest and arm pains. She is not sure she will go for further evaluation currently but would like to see if she can be seen in GI clinic sooner. I said I would relay this to the patient's primary GI team and see if something is available sooner. But we would recommend cardiac workup and rule out as next steps. I'll forward this information to the patient's PCP.   Justice Britain, MD [facility] # [id number]

## 2022-05-31 ENCOUNTER — Telehealth: Payer: Self-pay

## 2022-05-31 NOTE — Telephone Encounter (Signed)
Received the following message from front office staff.   Good morning,   Could you please call this patient and have her come in for an appt. I got a message from the GI folks who she called last night and she was complaining of chest and arm pain. Sounds like she doesn't want to ago to ED/UC for eval so we can see if she is at least willing to come in for an appointment here to further discuss.   I have some openings today if that works for her.   Thanks,  Ale   Attempted to call patient on mobile and home phone numbers. She did not answer. Cell phone- mail box full not accepting new messages. Home phone- rings and then receives busy signal.   Will attempt to call patient back at later time.   Talbot Grumbling, RN

## 2022-06-10 NOTE — Progress Notes (Deleted)
    SUBJECTIVE:   CHIEF COMPLAINT / HPI:   MARGARIDA MULLANE is a 57 y.o. female who presents to the [facility] clinic today to discuss the following concerns:   "Gastritis Flare"    PERTINENT  PMH / PSH: ***  OBJECTIVE:   There were no vitals taken for this visit.   General: NAD, pleasant, able to participate in exam Cardiac: RRR, no murmurs. Respiratory: CTAB, normal effort, No wheezes, rales or rhonchi Abdomen: Bowel sounds present, nontender, nondistended, no hepatosplenomegaly. Extremities: no edema or cyanosis. Skin: warm and dry, no rashes noted Neuro: alert, no obvious focal deficits Psych: Normal affect and mood  ASSESSMENT/PLAN:   No problem-specific Assessment & Plan notes found for this encounter.     [name], [place]

## 2022-06-11 ENCOUNTER — Encounter: Payer: Self-pay | Admitting: Family Medicine

## 2022-06-11 ENCOUNTER — Ambulatory Visit: Payer: Medicaid Other | Admitting: Family Medicine

## 2022-06-11 ENCOUNTER — Other Ambulatory Visit: Payer: Self-pay

## 2022-06-11 ENCOUNTER — Ambulatory Visit (INDEPENDENT_AMBULATORY_CARE_PROVIDER_SITE_OTHER): Payer: Medicaid Other | Admitting: Family Medicine

## 2022-06-11 VITALS — BP 143/101 | HR 79 | Ht 68.0 in | Wt 232.4 lb

## 2022-06-11 DIAGNOSIS — Z1231 Encounter for screening mammogram for malignant neoplasm of breast: Secondary | ICD-10-CM

## 2022-06-11 DIAGNOSIS — M79602 Pain in left arm: Secondary | ICD-10-CM | POA: Diagnosis not present

## 2022-06-11 DIAGNOSIS — R1084 Generalized abdominal pain: Secondary | ICD-10-CM

## 2022-06-11 DIAGNOSIS — R030 Elevated blood-pressure reading, without diagnosis of hypertension: Secondary | ICD-10-CM | POA: Diagnosis not present

## 2022-06-11 MED ORDER — GLYCERIN (ADULT) 2 G RE SUPP: 1.0000 | RECTAL | 0 refills | Status: DC | PRN

## 2022-06-11 NOTE — Progress Notes (Signed)
SUBJECTIVE:   CHIEF COMPLAINT / HPI:   [name] is a 57 y.o. female who presents to the [facility] clinic today to discuss the following concerns:   Abdominal Pain, Gas Pain Reports significant lower abdominal pain, states she feels like her gastritis is acting up. States if she presses her lower abdomen she can feel the gas come up her chest and to her left arm. Standing up helps. Sitting down makes it worse. Has constipation. She drinks prune juice about twice a week, has bowel movements only when she drinks the prune juice. BM are not solid. It is mostly gas that she passes. She feels temporarily improved after this. Also with some nausea but no vomiting. She feels short of breath. She has called the GI after hours line twice for this. They had scheduled her an appointment but she states she was "too sore" and was unable to make it. She is wondering if she needs an emergency colonoscopy for the pain. She feels that she cannot eat due to the pain.   She is taking Gas-X, dexilant.   Also states that just over a month ago she started to have pain on her left arm. Feels it every day. States it feels like when she had the carpal tunnel in her right arm. When she presses her left shoulder it will radiate down her arm. She did try oxycodone (had 3 leftover from previous prescription) for her arm which helped temporarily. It is not allowing her to sleep. Wondering if her Seroquel dose could be increased.   PERTINENT  PMH / PSH: GERD, anxiety and depression, HLD, hypothyroidism  OBJECTIVE:   BP (!) 143/101   Pulse 79   Ht 5\' 8"  (1.727 m)   Wt 232 lb 6.4 oz (105.4 kg)   SpO2 100%   BMI 35.34 kg/m    General: NAD, pleasant, able to participate in exam Neck: Supple, negative Spurlings b/l.  Cardiac: RRR, no murmurs. Respiratory: CTAB, normal effort, No wheezes, rales or rhonchi Abdomen: Abdomen is soft, has diffuse ttp with minimal palpation. Normoactive bowel sounds. No R/G.  Left Arm: 5/5  strength. Normal sensation of hand when compared to right. Pain with light touch to left shoulder without deformity, ecchymosis, rash. No edema. Lower Extremities:+lymphedema b/l Psych: Normal affect and mood  ASSESSMENT/PLAN:   1. Generalized abdominal pain Could be related to constipation. No concern for obstruction as she is passing gas. Given significant ttp on examination with minimal palpation, will obtain CT imaging to r/u more significant cause. Does not seem consistent with gastritis flare given that the pain is over entire abdomen. Will try suppository to help with constipation - glycerin adult 2 g suppository; Place 1 suppository rectally as needed for constipation.  Dispense: 12 suppository; Refill: 0 - Basic Metabolic Panel - CT Abdomen Pelvis W Contrast; Future  2. Breast cancer screening by mammogram Overdue for mammogram. Phone number provided in AVS for patient to call and schedule - MM Digital Screening; Future  3. Arm pain, diffuse, left Seems most likely neuropathic vs MSK. Could be from pinched nerve given description of shooting pain and similarity to when she had carpal tunnel. Could possibly benefit from c-spine imaging in future. Given her significant abdominal discomfort, majority of visit was dedicated to discussing this, encouraged close f/u to discuss more in detail regarding arm pain. Very low suspicion for cardiac etiology of pain.   4. Elevated Blood Pressure Reading Likely in setting of pain. Will recheck at  f/u. Not currently on any anti-hypertensives.   Also overdue for Pap smear, discussed on AVS.   Sabino Dick, DO [facility]

## 2022-06-11 NOTE — Patient Instructions (Addendum)
It was wonderful to see you today.  Please bring ALL of your medications with you to every visit.   Today we talked about:  I have sent glycerin suppositories to help with constipation.  We are doing lab work today to check your kidneys and electrolytes. I will send you a MyChart message if you have MyChart. Otherwise, I will give you a call for abnormal results or send a letter if everything returned back normal. If you don't hear from me in 2 weeks, please call the office.   I will order a CT scan to check on your abdomen.   -You are overdue for a Pap smear. This is a screening test to check for signs of cervical cancer. Please schedule and appointment to return for this at your earliest convenience.    -You are overdue for breast cancer screening, I have ordered a mammogram.  You will need to call to schedule appointment at your earliest convenience. Their number is [phone].   Thank you for coming to your visit as scheduled. We have had a large "no-show" problem lately, and this significantly limits our ability to see and care for patients. As a friendly reminder- if you cannot make your appointment please call to cancel. We do have a no show policy for those who do not cancel within 24 hours. Our policy is that if you miss or fail to cancel an appointment within 24 hours, 3 times in a 53-month period, you may be dismissed from our clinic.   Thank you for choosing [facility].   Please call [phone] with any questions about today's appointment.  Please be sure to schedule follow up at the front  desk before you leave today.   Sabino Dick, DO PGY-3 Family Medicine

## 2022-06-12 LAB — BASIC METABOLIC PANEL
BUN/Creatinine Ratio: 10 (ref 9–23)
BUN: 10 mg/dL (ref 6–24)
CO2: 25 mmol/L (ref 20–29)
Calcium: 9 mg/dL (ref 8.7–10.2)
Chloride: 101 mmol/L (ref 96–106)
Creatinine, Ser: 1 mg/dL (ref 0.57–1.00)
Glucose: 95 mg/dL (ref 70–99)
Potassium: 4.5 mmol/L (ref 3.5–5.2)
Sodium: 140 mmol/L (ref 134–144)
eGFR: 66 mL/min/{1.73_m2} (ref >59)

## 2022-06-14 ENCOUNTER — Other Ambulatory Visit: Payer: Self-pay | Admitting: Family Medicine

## 2022-06-14 ENCOUNTER — Telehealth: Payer: Self-pay

## 2022-06-14 DIAGNOSIS — M792 Neuralgia and neuritis, unspecified: Secondary | ICD-10-CM

## 2022-06-14 NOTE — Telephone Encounter (Signed)
Patient calls nurse line requesting returned call from PCP.   She would like to discuss results with PCP from recent visit.  She would also like to update provider on Glycerin suppository outcome. She states that suppository did not help. Small BM this AM.  She would also like to discuss imaging for arm pain.   Please return call to patient at [phone].  Veronda Prude, RN

## 2022-06-14 NOTE — Telephone Encounter (Signed)
Returned call to patient.  Discussed normal lab results.  She reports having a  bowel movement this morning without suppository or prune juice.  Her abdominal pain seems to be improving, now only on the left side and not as generalized as previously.  Discussed that my team is working to schedule her CT.  In regards to her arm pain, discussed that it sounds radicular.  She does have history of cervical degenerative disc, this is likely associated.  She was inquiring about neck steps.  We can recheck cervical spine imaging. She can go back to taking her Lyrica for pain relief. All questions answered.

## 2022-06-16 ENCOUNTER — Ambulatory Visit
Admission: RE | Admit: 2022-06-16 | Discharge: 2022-06-16 | Disposition: A | Discharge status: Home / Self Care | Payer: Medicaid Other | Source: Ambulatory Visit | Attending: Family Medicine | Admitting: Family Medicine

## 2022-06-16 DIAGNOSIS — M47812 Spondylosis without myelopathy or radiculopathy, cervical region: Secondary | ICD-10-CM | POA: Diagnosis not present

## 2022-06-16 DIAGNOSIS — M792 Neuralgia and neuritis, unspecified: Secondary | ICD-10-CM

## 2022-06-17 ENCOUNTER — Telehealth: Payer: Self-pay | Admitting: Family Medicine

## 2022-06-17 NOTE — Telephone Encounter (Signed)
Patient is calling and would like for Dr. Britt Bolognese to call her to discuss results of xray.   The best call back number is [phone]

## 2022-06-18 ENCOUNTER — Other Ambulatory Visit: Payer: Self-pay | Admitting: Family Medicine

## 2022-06-18 DIAGNOSIS — M79602 Pain in left arm: Secondary | ICD-10-CM

## 2022-06-18 MED ORDER — CLOBETASOL PROPIONATE 0.05 % EX OINT: TOPICAL_OINTMENT | CUTANEOUS | 0 refills | Status: DC

## 2022-06-18 NOTE — Telephone Encounter (Signed)
Called patient.  States that she continues to have tingling in fingers, neck, and shoulder.  Discussed next step would be MRI of c-spine. Will need to have this authorized by insurance prior to scheduling as well.  Can continue Lyrica for discomfort.  She was inquiring about coming in as a "walk-in" on Monday for possible cortisone injection.  She notes having trouble sleeping, needs something for relief.  Discussed that I am not sure that a cortisone injection would necessarily help with her symptoms. It may or may not help but would not ultimately fix her problem. Recommended patient call to schedule on Monday if still having discomfort.

## 2022-06-18 NOTE — Telephone Encounter (Signed)
Patient calls nurse line for several concerns.  She is requesting to speak with PCP to discuss recent xrays.   She is requesting a prescription to help with her ongoing arm pain.   She reports her abdominal pain has been persistent. No new or worsening symptoms. CT has been scheduled.   Will forward to PCP.

## 2022-06-22 ENCOUNTER — Other Ambulatory Visit: Payer: Self-pay | Admitting: Family Medicine

## 2022-06-22 DIAGNOSIS — M792 Neuralgia and neuritis, unspecified: Secondary | ICD-10-CM

## 2022-06-22 NOTE — Telephone Encounter (Signed)
Updated order and placed referral.

## 2022-06-22 NOTE — Telephone Encounter (Signed)
Patient returns call to nurse line. She is asking about status of MRI. Per note from [place], MRI was approved at [facility]. Please update order location to reflect [facility]. They will then contact patient in regards to scheduling.   Also, patient is requesting referral to be placed to [facility] for Nerve Conduction Testing.   Please advise.   Veronda Prude, RN

## 2022-06-22 NOTE — Addendum Note (Signed)
Addended by: Sabino Dick on: 06/22/2022 04:36 PM   Modules accepted: Orders

## 2022-06-24 ENCOUNTER — Telehealth: Payer: Self-pay

## 2022-06-24 NOTE — Telephone Encounter (Signed)
Patient calls nurse line requesting a one time prescription for anxiety medication.   She reports she has an MRI scheduled for 5/7 and she is needing something to relax her due to enclosed space.   Will forward to PCP.

## 2022-06-25 NOTE — Telephone Encounter (Signed)
Patient contacted.   Patient advised of plan. Patient will call a few days before procedure to "remind" PCP.

## 2022-06-30 ENCOUNTER — Other Ambulatory Visit: Payer: Self-pay | Admitting: Family Medicine

## 2022-07-02 ENCOUNTER — Telehealth: Payer: Self-pay

## 2022-07-02 NOTE — Telephone Encounter (Signed)
Patient calls nurse line requesting prescription for ibuprofen for pain management for left arm pain. She has an upcoming CT scan on 5/7.   She has been taking OTC Advil 600 mg every six hours. She is asking if she could get a prescription for 800 mg.   Forwarding request to PCP.   Veronda Prude, RN

## 2022-07-05 ENCOUNTER — Other Ambulatory Visit: Payer: Self-pay | Admitting: Family Medicine

## 2022-07-05 MED ORDER — IBUPROFEN 800 MG PO TABS: 800.0000 mg | ORAL_TABLET | Freq: Three times a day (TID) | ORAL | 0 refills | Status: DC | PRN

## 2022-07-05 NOTE — Telephone Encounter (Signed)
Rx sent 

## 2022-07-07 ENCOUNTER — Other Ambulatory Visit: Payer: Self-pay | Admitting: Family Medicine

## 2022-07-07 MED ORDER — LORAZEPAM 1 MG PO TABS: 1.0000 mg | ORAL_TABLET | Freq: Once | ORAL | 0 refills | Status: DC | PRN

## 2022-07-07 NOTE — Progress Notes (Signed)
Pt has MRI of c-spine scheduled for 5/7. Sending one time dose of Ativan to help with procedural anxiety. Pt should have driver.

## 2022-07-07 NOTE — Telephone Encounter (Signed)
One time dosing of Ativan sent to pharmacy with written instruction on bottle that she should have a driver and not drive as psychomotor impairment could last up to 24 hours. RN team, could you please call the patient and let her know about this as well?  Thank you!

## 2022-07-08 NOTE — Telephone Encounter (Signed)
Attempted to call patient, however no answer.   VM left informing.

## 2022-07-13 ENCOUNTER — Ambulatory Visit
Admission: RE | Admit: 2022-07-13 | Discharge: 2022-07-13 | Disposition: A | Discharge status: Home / Self Care | Payer: Medicaid Other | Source: Ambulatory Visit | Attending: Family Medicine | Admitting: Family Medicine

## 2022-07-13 DIAGNOSIS — M5410 Radiculopathy, site unspecified: Secondary | ICD-10-CM | POA: Diagnosis not present

## 2022-07-13 DIAGNOSIS — M542 Cervicalgia: Secondary | ICD-10-CM | POA: Diagnosis not present

## 2022-07-13 DIAGNOSIS — M79602 Pain in left arm: Secondary | ICD-10-CM

## 2022-07-14 ENCOUNTER — Ambulatory Visit: Payer: Medicaid Other | Admitting: Physician Assistant

## 2022-07-16 ENCOUNTER — Telehealth: Payer: Self-pay

## 2022-07-16 NOTE — Telephone Encounter (Signed)
Patient LVM requesting returned call to discuss MRI results once available.   Requesting returned call at [phone].  Advised patient that we are still waiting on radiologist to read report.   Patient verbalizes understanding and is appreciative.   Veronda Prude, RN

## 2022-07-19 ENCOUNTER — Other Ambulatory Visit: Payer: Self-pay | Admitting: Family Medicine

## 2022-07-19 DIAGNOSIS — M5020 Other cervical disc displacement, unspecified cervical region: Secondary | ICD-10-CM

## 2022-07-19 DIAGNOSIS — M792 Neuralgia and neuritis, unspecified: Secondary | ICD-10-CM

## 2022-07-19 MED ORDER — PREGABALIN 225 MG PO CAPS
225.0000 mg | ORAL_CAPSULE | Freq: Two times a day (BID) | ORAL | 2 refills | Status: DC
Start: 2022-07-19 — End: 2022-09-13

## 2022-07-19 NOTE — Telephone Encounter (Signed)
Patient returned Dr. Danton Sewer call. She would like for a call back when possible.

## 2022-07-19 NOTE — Telephone Encounter (Signed)
Attempted to call but unsuccessful. Will attempt again as time allows.

## 2022-07-19 NOTE — Telephone Encounter (Signed)
Called patient to discuss results.

## 2022-07-21 ENCOUNTER — Ambulatory Visit
Admission: RE | Admit: 2022-07-21 | Discharge: 2022-07-21 | Disposition: A | Discharge status: Home / Self Care | Payer: Medicaid Other | Source: Ambulatory Visit | Attending: Family Medicine | Admitting: Family Medicine

## 2022-07-21 DIAGNOSIS — R1084 Generalized abdominal pain: Secondary | ICD-10-CM

## 2022-07-21 DIAGNOSIS — K2900 Acute gastritis without bleeding: Secondary | ICD-10-CM | POA: Diagnosis not present

## 2022-07-21 DIAGNOSIS — R140 Abdominal distension (gaseous): Secondary | ICD-10-CM | POA: Diagnosis not present

## 2022-07-21 DIAGNOSIS — R112 Nausea with vomiting, unspecified: Secondary | ICD-10-CM | POA: Diagnosis not present

## 2022-07-21 DIAGNOSIS — K5900 Constipation, unspecified: Secondary | ICD-10-CM | POA: Diagnosis not present

## 2022-07-21 MED ORDER — IOPAMIDOL (ISOVUE-300) INJECTION 61%
100.0000 mL | Freq: Once | INTRAVENOUS | Status: AC | PRN
  Administered 2022-07-21: 100 mL via INTRAVENOUS

## 2022-07-26 ENCOUNTER — Telehealth: Payer: Self-pay

## 2022-07-26 NOTE — Telephone Encounter (Signed)
Patient calls nurse line regarding results of CT scan and referral to Neurologist.   Advised patient of CT results per mychart message from provider.   Provided patient with contact number for [facility].   Patient appreciative. No further questions at this time.   Veronda Prude, RN

## 2022-07-28 ENCOUNTER — Ambulatory Visit: Payer: Medicaid Other

## 2022-08-04 DIAGNOSIS — M50123 Cervical disc disorder at C6-C7 level with radiculopathy: Secondary | ICD-10-CM | POA: Diagnosis not present

## 2022-08-04 DIAGNOSIS — M12819 Other specific arthropathies, not elsewhere classified, unspecified shoulder: Secondary | ICD-10-CM | POA: Diagnosis not present

## 2022-08-04 DIAGNOSIS — Z6835 Body mass index (BMI) 35.0-35.9, adult: Secondary | ICD-10-CM | POA: Diagnosis not present

## 2022-08-05 ENCOUNTER — Telehealth: Payer: Self-pay | Admitting: Pharmacist

## 2022-08-05 DIAGNOSIS — F17210 Nicotine dependence, cigarettes, uncomplicated: Secondary | ICD-10-CM

## 2022-08-05 DIAGNOSIS — Z87891 Personal history of nicotine dependence: Secondary | ICD-10-CM

## 2022-08-05 NOTE — Assessment & Plan Note (Signed)
Patient contacted for follow/up of tobacco cessation attempt.   Since last contact patient reports continued abstinence.   Medications currently being used;  Denies taking any tobacco cessation agents.    Patient denies any significant side effects from tobacco cessation therapy.   Continues to rates IMPORTANCE of staying quit from tobacco as high.  Continues to rate CONFIDENCE of quitting tobacco as high.   Most common triggers to use tobacco include; PANIC attacks   Discussed her panic attacks and lack of control with escitalopram.  Unable to schedule with PCP due to lack of schedule availability.  I will forward to PCP to see if there may be a way to see her in the next month prior to her graduation.  

## 2022-08-05 NOTE — Assessment & Plan Note (Signed)
Patient contacted for follow/up of tobacco cessation attempt.   Since last contact patient reports continued abstinence.   Medications currently being used;  Denies taking any tobacco cessation agents.    Patient denies any significant side effects from tobacco cessation therapy.   Continues to rates IMPORTANCE of staying quit from tobacco as high.  Continues to rate CONFIDENCE of quitting tobacco as high.   Most common triggers to use tobacco include; PANIC attacks   Discussed her panic attacks and lack of control with escitalopram.  Unable to schedule with PCP due to lack of schedule availability.  I will forward to PCP to see if there may be a way to see her in the next month prior to her graduation.

## 2022-08-05 NOTE — Telephone Encounter (Signed)
-----   Message from Kathrin Ruddy, RPH-CPP sent at 06/22/2022  3:10 PM EDT ----- Regarding: Tobacco Cessaton - Reschedule from 06/2022 relapsed reported.

## 2022-08-05 NOTE — Telephone Encounter (Signed)
Patient contacted for follow/up of tobacco cessation attempt.   Since last contact patient reports continued abstinence.   Medications currently being used;  Denies taking any tobacco cessation agents.    Patient denies any significant side effects from tobacco cessation therapy.   Continues to rates IMPORTANCE of staying quit from tobacco as high.  Continues to rate CONFIDENCE of quitting tobacco as high.   Most common triggers to use tobacco include; PANIC attacks   Discussed her panic attacks and lack of control with escitalopram.  Unable to schedule with PCP due to lack of schedule availability.  I will forward to PCP to see if there may be a way to see her in the next month prior to her graduation.   Follow-up phone call planned: 3 months.

## 2022-08-06 NOTE — Telephone Encounter (Signed)
Reviewed and agree with Dr Koval's plan.   

## 2022-08-06 NOTE — Telephone Encounter (Signed)
Pt scheduled. Arleth Mccullar, CMA  

## 2022-08-09 NOTE — Progress Notes (Deleted)
    SUBJECTIVE:   CHIEF COMPLAINT / HPI:   *** *Smoking cessation *Needs Pap  Panic attacks - needs better control -Taking Seroquel, Lexapro and Hydroxyzine  PERTINENT  PMH / PSH: ***  OBJECTIVE:   There were no vitals taken for this visit. ***  General: NAD, pleasant, able to participate in exam Cardiac: RRR, no murmurs. Respiratory: CTAB, normal effort, No wheezes, rales or rhonchi Abdomen: Bowel sounds present, nontender, nondistended Extremities: no edema or cyanosis. Skin: warm and dry, no rashes noted Neuro: alert, no obvious focal deficits Psych: Normal affect and mood  ASSESSMENT/PLAN:   No problem-specific Assessment & Plan notes found for this encounter.     Dr. Elberta Fortis, DO [facility]    {    This will disappear when note is signed, click to select method of visit    :1}

## 2022-08-16 ENCOUNTER — Ambulatory Visit: Payer: Medicaid Other | Admitting: Nurse Practitioner

## 2022-08-17 ENCOUNTER — Ambulatory Visit: Payer: Medicaid Other | Admitting: Family Medicine

## 2022-08-26 ENCOUNTER — Telehealth: Payer: Self-pay

## 2022-08-26 NOTE — Telephone Encounter (Signed)
Prior Auth for patients medication QUETIAPINE approved by [employer] from 08/26/22 to 08/25/23.  CoverMyMeds Key[place]ma Friendly PA Case ID #: [id number]

## 2022-08-26 NOTE — Telephone Encounter (Signed)
A Prior Authorization was initiated for this patients QUETIAPINE through CoverMyMeds.   Key: [id number]

## 2022-08-27 ENCOUNTER — Ambulatory Visit: Payer: Medicaid Other

## 2022-09-03 NOTE — Progress Notes (Deleted)
    SUBJECTIVE:   CHIEF COMPLAINT / HPI:   BP check  L arm pain  Smoking cessation  PAP due  PERTINENT  PMH / PSH: ***  OBJECTIVE:   There were no vitals taken for this visit. ***  General: NAD, pleasant, able to participate in exam Cardiac: RRR, no murmurs. Respiratory: CTAB, normal effort, No wheezes, rales or rhonchi Abdomen: Bowel sounds present, nontender, nondistended Extremities: no edema or cyanosis. Skin: warm and dry, no rashes noted Neuro: alert, no obvious focal deficits Psych: Normal affect and mood  ASSESSMENT/PLAN:   No problem-specific Assessment & Plan notes found for this encounter.     Dr. Elberta Fortis, DO [facility]    {    This will disappear when note is signed, click to select method of visit    :1}

## 2022-09-06 ENCOUNTER — Ambulatory Visit: Payer: Medicaid Other | Admitting: Family Medicine

## 2022-09-10 NOTE — Progress Notes (Unsigned)
    SUBJECTIVE:   CHIEF COMPLAINT / HPI:   Smoking cessation Reports she started smoking again. Had an event in her personal life and feels like smoking is the only thing that makes her feel better. Reports the Wellbutrin did help when she was taking it but ran out and did not get a refill. Does not want nicotine replacement.  Anxiety and depression Patient reports she has had a depressed mood recently. Feels like her Lexapro and Hydroxyzine are not working. States she tried therapy with Psychology at [facility] and group psych but it did not help. Will consider one on one therapy but feels like it may not help. Reports wanting medication to help her in the moment when she starts to feel a panic attack come on.   PERTINENT  PMH / PSH: Hypothyroidism, GERD, Anxiety, Depression  OBJECTIVE:   BP 130/80 (BP Location: Right Arm, Patient Position: Sitting)   Pulse 86   Ht 5\' 8"  (1.727 m)   Wt 236 lb (107 kg)   SpO2 99%   BMI 35.88 kg/m    General: Alert, no apparent distress, well groomed HEENT: Normocephalic, atraumatic, moist mucus membranes, neck supple Respiratory: Normal respiratory effort GI: Non-distended Skin: No rashes, no jaundice Psych: Appropriate mood and affect  ASSESSMENT/PLAN:   Tobacco use disorder Started smoking again. Feels like Wellbutrin helped but ran out and has not taken it for some time. Would like to quit but states smoking is the only thing that make her feel better. Declines nicotine replacement. -Restart Wellbutrin 300mg  XR. Advised patient to start taking 1-2 weeks after trying Sertraline to ensure she tolerate both medications well  Healthcare maintenance Due to PAP, will discuss at next visit  Anxiety and depression PHQ: 12, denies SI. Uncontrolled symptoms not relieved by Escitalopram 10mg  and Hydroxyzine. Reports worsening depressed mood and increased episodes of panic. -START taking Sertraline 50mg  daily -STOP taking Escitalopram 10mg   daily -Provided list of psychiatry and counseling resources -F/u in 4 weeks to assess mood  Hypothyroidism TSH normal in 11/2021. Refill Levothyroxine 150 mcg. -Consider TSH at next visit  GERD (gastroesophageal reflux disease) Stable. Refill Dexlansoprazole 60mg .  Restless legs Stable. Refill Pregabalin 225mg  BID  COPD (chronic obstructive pulmonary disease) (HCC) Stable. Refill Spiriva and albuterol inhalers. Discuss treatment optimization and PFTs at follow up visit.    Dr. Elberta Fortis, DO [facility]

## 2022-09-13 ENCOUNTER — Encounter: Payer: Self-pay | Admitting: Family Medicine

## 2022-09-13 ENCOUNTER — Ambulatory Visit (INDEPENDENT_AMBULATORY_CARE_PROVIDER_SITE_OTHER): Payer: Medicaid Other | Admitting: Family Medicine

## 2022-09-13 VITALS — BP 130/80 | HR 86 | Ht 68.0 in | Wt 236.0 lb

## 2022-09-13 DIAGNOSIS — M5020 Other cervical disc displacement, unspecified cervical region: Secondary | ICD-10-CM | POA: Diagnosis not present

## 2022-09-13 DIAGNOSIS — M792 Neuralgia and neuritis, unspecified: Secondary | ICD-10-CM | POA: Diagnosis not present

## 2022-09-13 DIAGNOSIS — F419 Anxiety disorder, unspecified: Secondary | ICD-10-CM | POA: Diagnosis not present

## 2022-09-13 DIAGNOSIS — F32A Depression, unspecified: Secondary | ICD-10-CM

## 2022-09-13 DIAGNOSIS — F17200 Nicotine dependence, unspecified, uncomplicated: Secondary | ICD-10-CM | POA: Diagnosis not present

## 2022-09-13 DIAGNOSIS — Z716 Tobacco abuse counseling: Secondary | ICD-10-CM | POA: Diagnosis not present

## 2022-09-13 DIAGNOSIS — K219 Gastro-esophageal reflux disease without esophagitis: Secondary | ICD-10-CM | POA: Diagnosis not present

## 2022-09-13 DIAGNOSIS — L309 Dermatitis, unspecified: Secondary | ICD-10-CM | POA: Diagnosis not present

## 2022-09-13 DIAGNOSIS — E890 Postprocedural hypothyroidism: Secondary | ICD-10-CM

## 2022-09-13 DIAGNOSIS — J449 Chronic obstructive pulmonary disease, unspecified: Secondary | ICD-10-CM

## 2022-09-13 DIAGNOSIS — G2581 Restless legs syndrome: Secondary | ICD-10-CM

## 2022-09-13 DIAGNOSIS — Z0000 Encounter for general adult medical examination without abnormal findings: Secondary | ICD-10-CM

## 2022-09-13 MED ORDER — SERTRALINE HCL 50 MG PO TABS
50.0000 mg | ORAL_TABLET | Freq: Every day | ORAL | 3 refills | Status: DC
Start: 2022-09-13 — End: 2022-11-04

## 2022-09-13 MED ORDER — BUPROPION HCL ER (XL) 300 MG PO TB24
300.0000 mg | ORAL_TABLET | Freq: Every day | ORAL | 0 refills | Status: DC
Start: 2022-09-13 — End: 2023-08-05

## 2022-09-13 MED ORDER — CLOBETASOL PROPIONATE 0.05 % EX OINT: TOPICAL_OINTMENT | CUTANEOUS | 0 refills | Status: DC

## 2022-09-13 MED ORDER — LEVOTHYROXINE SODIUM 150 MCG PO TABS: 150.0000 ug | ORAL_TABLET | Freq: Every day | ORAL | 0 refills | Status: DC

## 2022-09-13 MED ORDER — VENTOLIN HFA 108 (90 BASE) MCG/ACT IN AERS: INHALATION_SPRAY | RESPIRATORY_TRACT | 1 refills | Status: DC

## 2022-09-13 MED ORDER — DEXLANSOPRAZOLE 60 MG PO CPDR: DELAYED_RELEASE_CAPSULE | ORAL | 0 refills | Status: DC

## 2022-09-13 MED ORDER — PREGABALIN 225 MG PO CAPS: 225.0000 mg | ORAL_CAPSULE | Freq: Two times a day (BID) | ORAL | 2 refills | Status: DC

## 2022-09-13 MED ORDER — SPIRIVA HANDIHALER 18 MCG IN CAPS
ORAL_CAPSULE | RESPIRATORY_TRACT | 2 refills | Status: AC
Start: 2022-09-13 — End: ?

## 2022-09-13 NOTE — Assessment & Plan Note (Addendum)
PHQ: 12, denies SI. Uncontrolled symptoms not relieved by Escitalopram 10mg  and Hydroxyzine. Reports worsening depressed mood and increased episodes of panic. -START taking Sertraline 50mg  daily -STOP taking Escitalopram 10mg  daily -Provided list of psychiatry and counseling resources -F/u in 4 weeks to assess mood

## 2022-09-13 NOTE — Assessment & Plan Note (Signed)
Due to PAP, will discuss at next visit

## 2022-09-13 NOTE — Patient Instructions (Signed)
It was wonderful to see you today! Thank you for choosing [facility].   Please bring ALL of your medications with you to every visit.   Today we talked about:  Please stop taking the Lexapro and start taking the Sertraline 50mg  every day. I would take it the next 4 weeks before seeing if it has an effects. I am also sending in the Wellbutrin for you to restart. Please restart it next week after you let your body have time to adjust to the Sertraline. I also included a list of psychiatry resources below. Please consider talking to someone about your depression and anxiety in addition to the medication changes.  Please follow up in 4 weeks  If you haven't already, sign up for My Chart to have easy access to your labs results, and communication with your primary care physician.   We are checking some labs today. If they are abnormal, I will call you. If they are normal, I will send you a MyChart message (if it is active) or a letter in the mail. If you do not hear about your labs in the next 2 weeks, please call the office.  Call the clinic at [phone] if your symptoms worsen or you have any concerns.  Please be sure to schedule follow up at the front desk before you leave today.   Elberta Fortis, DO Family Medicine    Psychiatry Resource List (Adults and Children) Most of these providers will take Medicaid. please consult your insurance for a complete and updated list of available providers. When calling to make an appointment have your insurance information available to confirm you are covered.   [facility]:Psychiatry and Counseling [address] [facility] [address] [place], [place] [address] [phone]  [facility]  [address] [place], [place] [phone] Crisis [phone]   [facility]:   [facility]: [address]     [phone]   [facility]: [address] [place]. [place],        [phone] [place]:  [address] [phone],    [phone] [place]: [phone] [address],                   [phone] Children: [facility] [address]         [phone]  [facility] (virtual only) [phone]   [facility]  (Psychiatry only; Adults /children 12 and over, will take Medicaid)  [address] [place] Josephs [address], [place], [place] [address]       [phone]   [facility] (Psychiatry & counseling ; adults & children ; will take Medicaid [address]  [place] [address]  Go on-line to complete referral ( https://[email or web address]/en/make-[email or web address]-referral [phone]    (Spanish speaking therapists)  [facility]  Psychiatry & counseling; Adults and children;  Call Registration prior to scheduling an appointment [phone] [address]    [place], [place] [address]    [phone]  [facility] (Psychiatry & counseling; adults & children; Medicare no Medicaid)  [address]   [place], [place]  [address]      [phone]    [facility] (up to age 42)  Psychiatry & counseling ,will take Medicaid, must do counseling to receive psychiatry services  [address] [place] [address]        [phone]  [facility] (Psychiatry & counseling; adults & children; will take Medicaid) Will need a referral from provider [address],  [place], [place]  [phone]   [facility] --- Walk-In Mon-Friday 8am-3pm ( will take Medicaid, Psychiatry, Adults & children,  [address] [place], [place]   [phone]   [facility] [place], Walk-in M-F 8am-12pm and 1pm -3pm   (Counseling, Psychiatry, will take Medicaid, adults & children)  [address], [place], [place]  [phone]

## 2022-09-13 NOTE — Assessment & Plan Note (Signed)
Stable. Refill Spiriva and albuterol inhalers. Discuss treatment optimization and PFTs at follow up visit.

## 2022-09-13 NOTE — Assessment & Plan Note (Addendum)
Stable. Refill Pregabalin 225mg  BID

## 2022-09-13 NOTE — Assessment & Plan Note (Signed)
TSH normal in 11/2021. Refill Levothyroxine 150 mcg. -Consider TSH at next visit

## 2022-09-13 NOTE — Assessment & Plan Note (Signed)
Stable. Refill Dexlansoprazole 60mg .

## 2022-09-13 NOTE — Assessment & Plan Note (Addendum)
Started smoking again. Feels like Wellbutrin helped but ran out and has not taken it for some time. Would like to quit but states smoking is the only thing that make her feel better. Declines nicotine replacement. -Restart Wellbutrin 300mg  XR. Advised patient to start taking 1-2 weeks after trying Sertraline to ensure she tolerate both medications well

## 2022-09-22 ENCOUNTER — Encounter: Payer: Self-pay | Admitting: Student

## 2022-09-22 ENCOUNTER — Telehealth: Payer: Self-pay

## 2022-09-22 ENCOUNTER — Ambulatory Visit (INDEPENDENT_AMBULATORY_CARE_PROVIDER_SITE_OTHER): Payer: Medicaid Other | Admitting: Student

## 2022-09-22 VITALS — BP 123/95 | HR 59 | Ht 68.0 in | Wt 233.4 lb

## 2022-09-22 DIAGNOSIS — R221 Localized swelling, mass and lump, neck: Secondary | ICD-10-CM

## 2022-09-22 MED ORDER — ONDANSETRON HCL 4 MG PO TABS: 4.0000 mg | ORAL_TABLET | Freq: Three times a day (TID) | ORAL | 0 refills | Status: DC | PRN

## 2022-09-22 MED ORDER — IBUPROFEN 800 MG PO TABS: 800.0000 mg | ORAL_TABLET | Freq: Four times a day (QID) | ORAL | 0 refills | Status: DC | PRN

## 2022-09-22 NOTE — Telephone Encounter (Signed)
Attempted to reach patient. No answer. Unable to LVM due to mailbox being full. To inform of Ct scan at [facility] July 30th at 11:15am was the earliest to I could get. Got to entrance A no prep is required. Aquilla Solian, CMA

## 2022-09-22 NOTE — Progress Notes (Signed)
  SUBJECTIVE:   CHIEF COMPLAINT / HPI:   Growth on neck Symptoms started about a week ago, she noticed she could feel her neck, every time she lifted her neck. Also notes pain traveling up neck from location. Patient also note hx of thyroidectomy ~ 30 years ago. Also note's her voice has been hoarse. Note's she has a hx of chocking w/ swallowing and reports no change, but continues to have difficulty swallowing at times. No problems breathing. Report's she has a cough, but that's from her COPD. Also feels like her bump is getting bigger.    Patient Care Team: Elberta Fortis, MD as PCP - General (Family Medicine) Linna Darner, RD as Dietitian (Family Medicine) OBJECTIVE:  BP (!) 123/95   Pulse (!) 59   Ht 5\' 8"  (1.727 m)   Wt 233 lb 6.4 oz (105.9 kg)   SpO2 100%   BMI 35.49 kg/m  Physical Exam Neck:     Trachea: Tracheal tenderness present.     Comments: TTP overlying right para-medial side of neck, midway up neck and ascending up front of neck Musculoskeletal:     Cervical back: Neck supple. Tenderness present. No edema, erythema, signs of trauma or rigidity. Pain with movement present. Normal range of motion.  Lymphadenopathy:     Cervical: No cervical adenopathy.      ASSESSMENT/PLAN:  Neck mass Assessment & Plan: Patient comes in for neck mass/swelling that began about a week ago.  Patient notes tenderness in anterior portion of neck.  Patient had thyroidectomy for goiter removed 30 years ago.  Patient also notes her voice is hoarse, and she has some difficulty with swallowing at baseline.  Patient has history of esophageal spasms, and COPD, meaning at baseline she has difficulty swallowing/with coughing.  Patient denies any issues with breathing.  On physical exam, unable to palpate clear neck mass, however appears to be deep to neck as patient feels Seshan in her throat.  Patient denies any systemic symptoms.  Low concern for infection given lack of systemic symptoms.  Mass  poorly defined unlikely to be reactive lymph node.  Provider unsure what could be causing anterior neck pain, will obtain CT neck with contrast.  Return precautions given. - CT neck with contrast - Return precautions for (changes in breathing, changes in swallowing, throat closing)  Orders: -     CT SOFT TISSUE NECK W CONTRAST; Future  Other orders -     Ibuprofen; Take 1 tablet (800 mg total) by mouth every 6 (six) hours as needed for moderate pain.  Dispense: 60 tablet; Refill: 0 -     Ondansetron HCl; Take 1 tablet (4 mg total) by mouth every 8 (eight) hours as needed for nausea or vomiting.  Dispense: 20 tablet; Refill: 0   No follow-ups on file. Bess Kinds, MD 09/22/2022, 5:56 PM PGY-3, [facility]

## 2022-09-22 NOTE — Assessment & Plan Note (Signed)
Patient comes in for neck mass/swelling that began about a week ago.  Patient notes tenderness in anterior portion of neck.  Patient had thyroidectomy for goiter removed 30 years ago.  Patient also notes her voice is hoarse, and she has some difficulty with swallowing at baseline.  Patient has history of esophageal spasms, and COPD, meaning at baseline she has difficulty swallowing/with coughing.  Patient denies any issues with breathing.  On physical exam, unable to palpate clear neck mass, however appears to be deep to neck as patient feels Seshan in her throat.  Patient denies any systemic symptoms.  Low concern for infection given lack of systemic symptoms.  Mass poorly defined unlikely to be reactive lymph node.  Provider unsure what could be causing anterior neck pain, will obtain CT neck with contrast.  Return precautions given. - CT neck with contrast - Return precautions for (changes in breathing, changes in swallowing, throat closing)

## 2022-09-22 NOTE — Patient Instructions (Addendum)
It was great to see you! Thank you for allowing me to participate in your care!  I am not sure what is causing this neck mass, but am concerned to hear it's getting bigger/difficult to swallow at times. We are going to get a CT Scan of your neck to see what's going on  Our plans for today:  - CT Scan Neck [facility] Address: [address] [place], [place], [place] [address] Phone: [phone] *Someone will call when it's scheduled  - Seek Immediate Emergency Care if:  You develop fevers with worsening neck pain  You develop difficulty breathing  You develop worsening difficulty swallowing  You feel that throat is closing  Take care and seek immediate care sooner if you develop any concerns.   Dr. Bess Kinds, MD [facility]

## 2022-09-28 NOTE — Telephone Encounter (Signed)
Spoke with patient. Informed of CT scan appt. Patient understood. Aquilla Solian, CMA

## 2022-09-29 ENCOUNTER — Ambulatory Visit: Payer: Medicaid Other

## 2022-10-05 ENCOUNTER — Ambulatory Visit (HOSPITAL_COMMUNITY)
Admission: RE | Admit: 2022-10-05 | Discharge: 2022-10-05 | Disposition: A | Discharge status: Home / Self Care | Payer: Medicaid Other | Source: Ambulatory Visit | Attending: Family Medicine | Admitting: Family Medicine

## 2022-10-05 DIAGNOSIS — R221 Localized swelling, mass and lump, neck: Secondary | ICD-10-CM | POA: Insufficient documentation

## 2022-10-05 MED ORDER — IOHEXOL 350 MG/ML SOLN
75.0000 mL | Freq: Once | INTRAVENOUS | Status: AC | PRN
  Administered 2022-10-05: 75 mL via INTRAVENOUS

## 2022-10-07 NOTE — Progress Notes (Deleted)
    SUBJECTIVE:   CHIEF COMPLAINT / HPI:   Neck mass CT scan pending read  Smoking cessation On Wellbutrin 300mg  XR  Depression/anxiety Started on Sertraline 50mg  at last visit. Provided counseling resources.  *Pap?  PERTINENT  PMH / PSH: ***  OBJECTIVE:   There were no vitals taken for this visit. ***  General: NAD, pleasant, able to participate in exam Cardiac: RRR, no murmurs. Respiratory: CTAB, normal effort, No wheezes, rales or rhonchi Abdomen: Bowel sounds present, nontender, nondistended Extremities: no edema or cyanosis. Skin: warm and dry, no rashes noted Neuro: alert, no obvious focal deficits Psych: Normal affect and mood  ASSESSMENT/PLAN:   No problem-specific Assessment & Plan notes found for this encounter.     Dr. Elberta Fortis, DO [facility]    {    This will disappear when note is signed, click to select method of visit    :1}

## 2022-10-08 ENCOUNTER — Ambulatory Visit: Payer: Medicaid Other | Admitting: Family Medicine

## 2022-10-15 ENCOUNTER — Other Ambulatory Visit: Payer: Self-pay

## 2022-10-15 ENCOUNTER — Other Ambulatory Visit: Payer: Self-pay | Admitting: Family Medicine

## 2022-10-15 DIAGNOSIS — K219 Gastro-esophageal reflux disease without esophagitis: Secondary | ICD-10-CM

## 2022-10-15 MED ORDER — QUETIAPINE FUMARATE 25 MG PO TABS: 50.0000 mg | ORAL_TABLET | Freq: Every day | ORAL | 1 refills | Status: DC

## 2022-10-20 ENCOUNTER — Ambulatory Visit: Payer: Medicaid Other

## 2022-11-01 NOTE — Progress Notes (Signed)
    SUBJECTIVE:   CHIEF COMPLAINT / HPI:   Tobacco use Reports she has been smoking more recently, around 1 pack per day. Started on Wellbutrin 300mg  XR without improvement. Feels her cigarette use is mostly tied to her mood. Relates she uses smoking to "calm down". Willing to try nicotine replacement with patches.  Depression/anxiety Started on Sertraline 50mg  at last visit, feels like it has not helped. Provided counseling resources but states she misplaced them and needs another copy. Reports worsening anger outbursts that are difficult to control that she feels are due to menopause. More interested in as needed therapy as opposed to maintenance medications. Tried Xanax in the past with benefit.   PERTINENT  PMH / PSH: Hypothyroidism, GERD, Anxiety, Depression, postmenopausal  OBJECTIVE:   BP 123/86 (BP Location: Left Arm, Patient Position: Sitting, Cuff Size: Normal)   Pulse 61   Temp 98.1 F (36.7 C) (Oral)   Ht 5\' 8"  (1.727 m)   Wt 227 lb (103 kg)   SpO2 99%   BMI 34.52 kg/m    General: Alert, no apparent distress, well groomed HEENT: Normocephalic, atraumatic, moist mucus membranes, neck supple Respiratory: Normal respiratory effort GI: Non-distended Skin: No rashes, no jaundice Ext: Lymphedema bilaterally, non-pitting edema noted. Motor and sensation of bilateral extremities intact. 2+ dorsalis and posterior tibialis pulses bilaterally Psych: Appropriate mood and affect   ASSESSMENT/PLAN:   Tobacco use disorder Increase in smoking, around 1 pack per day currently. Most likely due to mood concerns. On Wellbutrin 300XR and patient willing to try nicotine replacement with patch, rx sent.  Anxiety and depression Symptoms uncontrolled on Sertraline 50mg , reports worsening angry outbursts. Discussed increased dosage and patient expressed concern about taking additional medications. Strongly requesting Xanax as she has taken it previously with benefit. Discussed need for  maintenance therapy to reduce the frequency of anger/panic episode and risk of rebound symptoms and over use with Xanax. Expressed frustration at this and she did not feel the Sertraline would work. Given menopausal symptoms with hot flashes, will switch to Venlafaxine to address both concerns. Encouraged patient to consider psychiatric evaluation for counseling and medication management. -STOP Sertraline 50mg  -START Venlafaxine 37.5mg  daily -Provided list of psychiatry resources -F/u in 1 month to assess mood  Lymphedema Evaluated bilateral lymphedema, stable per patient as I have not evaluated it previously. Non-pitting and otherwise normal exam. On disability, patient to complete paperwork for renewal.  Chronic pain Currently taking Ibuprofen 800mg  prn for back and neck pain. Requesting refill on medication, reports taking it daily. Discussed I do not recommend taking long term due to possible impact on kidney and gut.  -Refill sent for short-term supply and recommended to use sparingly -Plan to discuss alternative options at follow up visit.  Hyperlipidemia Inconsistent use of Pravastatin, will refill today. -A1c, lipid panel, BMP ordered  Hypothyroidism Stable on current therapy. -Annual TSH ordered    *Patient had to leave unexpectedly prior to obtaining labs. Scheduled for lab visit 8/30 @ 11AM to obtain  Dr. Elberta Fortis, DO [facility]

## 2022-11-04 ENCOUNTER — Ambulatory Visit (INDEPENDENT_AMBULATORY_CARE_PROVIDER_SITE_OTHER): Payer: Medicaid Other | Admitting: Family Medicine

## 2022-11-04 ENCOUNTER — Encounter: Payer: Self-pay | Admitting: Family Medicine

## 2022-11-04 VITALS — BP 123/86 | HR 61 | Temp 98.1°F | Ht 68.0 in | Wt 227.0 lb

## 2022-11-04 DIAGNOSIS — M549 Dorsalgia, unspecified: Secondary | ICD-10-CM | POA: Diagnosis not present

## 2022-11-04 DIAGNOSIS — E785 Hyperlipidemia, unspecified: Secondary | ICD-10-CM

## 2022-11-04 DIAGNOSIS — Z720 Tobacco use: Secondary | ICD-10-CM

## 2022-11-04 DIAGNOSIS — F17200 Nicotine dependence, unspecified, uncomplicated: Secondary | ICD-10-CM

## 2022-11-04 DIAGNOSIS — E039 Hypothyroidism, unspecified: Secondary | ICD-10-CM

## 2022-11-04 DIAGNOSIS — G8929 Other chronic pain: Secondary | ICD-10-CM | POA: Diagnosis not present

## 2022-11-04 DIAGNOSIS — F32A Depression, unspecified: Secondary | ICD-10-CM

## 2022-11-04 DIAGNOSIS — R232 Flushing: Secondary | ICD-10-CM

## 2022-11-04 DIAGNOSIS — I890 Lymphedema, not elsewhere classified: Secondary | ICD-10-CM

## 2022-11-04 DIAGNOSIS — K219 Gastro-esophageal reflux disease without esophagitis: Secondary | ICD-10-CM | POA: Diagnosis not present

## 2022-11-04 DIAGNOSIS — F419 Anxiety disorder, unspecified: Secondary | ICD-10-CM

## 2022-11-04 MED ORDER — NICOTINE 21 MG/24HR TD PT24
21.0000 mg | MEDICATED_PATCH | Freq: Every day | TRANSDERMAL | 0 refills | Status: DC
Start: 2022-11-04 — End: 2023-04-14

## 2022-11-04 MED ORDER — PRAVASTATIN SODIUM 40 MG PO TABS: 40.0000 mg | ORAL_TABLET | Freq: Every day | ORAL | 3 refills | Status: DC

## 2022-11-04 MED ORDER — DEXLANSOPRAZOLE 60 MG PO CPDR: DELAYED_RELEASE_CAPSULE | ORAL | 0 refills | Status: DC

## 2022-11-04 MED ORDER — IBUPROFEN 800 MG PO TABS
800.0000 mg | ORAL_TABLET | Freq: Two times a day (BID) | ORAL | 0 refills | Status: DC | PRN
Start: 2022-11-04 — End: 2023-07-18

## 2022-11-04 MED ORDER — VENLAFAXINE HCL ER 37.5 MG PO CP24
37.5000 mg | ORAL_CAPSULE | Freq: Every day | ORAL | 0 refills | Status: DC
Start: 2022-11-04 — End: 2022-12-15

## 2022-11-04 NOTE — Patient Instructions (Addendum)
It was wonderful to see you today! Thank you for choosing [facility].   Please bring ALL of your medications with you to every visit.   Today we talked about:  We are switching you to Effexor for your mood stability. I hope this will help your symptoms and also improve your hot flashes. I also included the list of psychiatry resources below again for your use. I sent in the nicotine patches to help you quit smoking. Replace them every 24 hours and you can take it off at night before bed if it is impacting your sleep. Please keep your lab appointment tomorrow and I will follow up with results. I refilled the Ibuprofen but I do not recommend taking it long term or every day as it impacts your kidneys and gut. We can discuss alternatives at your follow up visit.  Please follow up in 1 month for mood check    We are checking some labs today. If they are abnormal, I will call you. If they are normal, I will send you a MyChart message (if it is active) or a letter in the mail. If you do not hear about your labs in the next 2 weeks, please call the office.  Call the clinic at [phone] if your symptoms worsen or you have any concerns.  Please be sure to schedule follow up at the front desk before you leave today.   Elberta Fortis, DO Family Medicine    Psychiatry Resource List (Adults and Children) Most of these providers will take Medicaid. please consult your insurance for a complete and updated list of available providers. When calling to make an appointment have your insurance information available to confirm you are covered.   [facility]:Psychiatry and Counseling [address] [facility] [address] [place], [place] [address] [phone]  [facility]  [address] [place], [place] [phone] Crisis [phone]   [facility]:   [facility]: [address]     [phone]   [facility]: [address] [place]. [place],         [phone] [place]: [address] [phone],    [phone] [place]: [phone] [address],                   [phone] Children: [facility] [address]         [phone]  [facility] (virtual only) [phone]   [facility]  (Psychiatry only; Adults /children 12 and over, will take Medicaid)  [address] [place]urell Josephs [address], [place], [place] [address]       [phone]   [facility] (Psychiatry & counseling ; adults & children ; will take Medicaid [address]  [place] [address]  Go on-line to complete referral ( [email or web address]://[email or web address]/make-[email or web address]-referral [phone]    (Spanish speaking therapists)  [facility]  Psychiatry & counseling; Adults and children;  Call Registration prior to scheduling an appointment [phone] [address]    [place], [place] [address]    [phone]  [facility] (Psychiatry & counseling; adults & children; Medicare no Medicaid)  [address]   [place], [place]  [address]      [phone]    [facility] (up to age 90)  Psychiatry & counseling ,will take Medicaid, must do counseling to receive psychiatry services  [address] [place]  [address]        [phone]  [facility] (Psychiatry & counseling; adults & children; will take Medicaid) Will need a referral from provider [address],  [place], [place]  [phone]   [facility] --- Walk-In Mon-Friday 8am-3pm ( will take Medicaid, Psychiatry, Adults & children,  [address] [place], [place]   [phone]   [facility] [place], Walk-in M-F 8am-12pm and 1pm -3pm   (Counseling, Psychiatry, will take Medicaid, adults & children)  [address] [place], [place]  [phone]

## 2022-11-05 ENCOUNTER — Other Ambulatory Visit (INDEPENDENT_AMBULATORY_CARE_PROVIDER_SITE_OTHER): Payer: Medicaid Other

## 2022-11-05 DIAGNOSIS — E039 Hypothyroidism, unspecified: Secondary | ICD-10-CM | POA: Diagnosis not present

## 2022-11-05 DIAGNOSIS — E785 Hyperlipidemia, unspecified: Secondary | ICD-10-CM | POA: Diagnosis not present

## 2022-11-05 LAB — POCT GLYCOSYLATED HEMOGLOBIN (HGB A1C): Hemoglobin A1C: 5.1 % (ref 4.0–5.6)

## 2022-11-05 NOTE — Assessment & Plan Note (Signed)
Evaluated bilateral lymphedema, stable per patient as I have not evaluated it previously. Non-pitting and otherwise normal exam. On disability, patient to complete paperwork for renewal.

## 2022-11-05 NOTE — Assessment & Plan Note (Signed)
Increase in smoking, around 1 pack per day currently. Most likely due to mood concerns. On Wellbutrin 300XR and patient willing to try nicotine replacement with patch, rx sent.

## 2022-11-05 NOTE — Assessment & Plan Note (Signed)
Inconsistent use of Pravastatin, will refill today. -A1c, lipid panel, BMP ordered

## 2022-11-05 NOTE — Assessment & Plan Note (Signed)
Stable on current therapy. -Annual TSH ordered

## 2022-11-05 NOTE — Assessment & Plan Note (Addendum)
Currently taking Ibuprofen 800mg  prn for back and neck pain. Requesting refill on medication, reports taking it daily. Discussed I do not recommend taking long term due to possible impact on kidney and gut.  -Refill sent for short-term supply and recommended to use sparingly -Plan to discuss alternative options at follow up visit.

## 2022-11-05 NOTE — Assessment & Plan Note (Addendum)
Symptoms uncontrolled on Sertraline 50mg , reports worsening angry outbursts. Discussed increased dosage and patient expressed concern about taking additional medications. Strongly requesting Xanax as she has taken it previously with benefit. Discussed need for maintenance therapy to reduce the frequency of anger/panic episode and risk of rebound symptoms and over use with Xanax. Expressed frustration at this and she did not feel the Sertraline would work. Given menopausal symptoms with hot flashes, will switch to Venlafaxine to address both concerns. Encouraged patient to consider psychiatric evaluation for counseling and medication management. -STOP Sertraline 50mg  -START Venlafaxine 37.5mg  daily -Provided list of psychiatry resources -F/u in 1 month to assess mood

## 2022-11-06 LAB — LIPID PANEL
Chol/HDL Ratio: 3.8 ratio (ref 0.0–4.4)
Cholesterol, Total: 240 mg/dL — ABNORMAL HIGH (ref 100–199)
HDL: 63 mg/dL (ref >39)
LDL Chol Calc (NIH): 159 mg/dL — ABNORMAL HIGH (ref 0–99)
Triglycerides: 103 mg/dL (ref 0–149)
VLDL Cholesterol Cal: 18 mg/dL (ref 5–40)

## 2022-11-06 LAB — CBC
Hematocrit: 42.1 % (ref 34.0–46.6)
Hemoglobin: 14.5 g/dL (ref 11.1–15.9)
MCH: 33.6 pg — ABNORMAL HIGH (ref 26.6–33.0)
MCHC: 34.4 g/dL (ref 31.5–35.7)
MCV: 98 fL — ABNORMAL HIGH (ref 79–97)
Platelets: 210 10*3/uL (ref 150–450)
RBC: 4.32 x10E6/uL (ref 3.77–5.28)
RDW: 11.9 % (ref 11.7–15.4)
WBC: 3.1 10*3/uL — ABNORMAL LOW (ref 3.4–10.8)

## 2022-11-06 LAB — BASIC METABOLIC PANEL
BUN/Creatinine Ratio: 13 (ref 9–23)
BUN: 12 mg/dL (ref 6–24)
CO2: 19 mmol/L — ABNORMAL LOW (ref 20–29)
Calcium: 8.5 mg/dL — ABNORMAL LOW (ref 8.7–10.2)
Chloride: 108 mmol/L — ABNORMAL HIGH (ref 96–106)
Creatinine, Ser: 0.91 mg/dL (ref 0.57–1.00)
Glucose: 89 mg/dL (ref 70–99)
Potassium: 4 mmol/L (ref 3.5–5.2)
Sodium: 141 mmol/L (ref 134–144)
eGFR: 74 mL/min/{1.73_m2} (ref >59)

## 2022-11-06 LAB — TSH RFX ON ABNORMAL TO FREE T4: TSH: 2.62 u[IU]/mL (ref 0.450–4.500)

## 2022-11-09 ENCOUNTER — Telehealth: Payer: Self-pay | Admitting: Pharmacist

## 2022-11-09 DIAGNOSIS — F17200 Nicotine dependence, unspecified, uncomplicated: Secondary | ICD-10-CM

## 2022-11-09 NOTE — Assessment & Plan Note (Signed)
Patient contacted for follow/up of tobacco cessation history of multiple months who at last PCP visit was noted to have relapsed.   Since last contact patient reports had trouble with "menopause", anxiety and stress.  She reports a return to smoking ~ 1 ppd.   Medications currently being used; None yet but has been prescribed patches and is planning to restart patches soon.  She plans to quit completely at that time.   Continues to rates IMPORTANCE of quitting tobacco as high.

## 2022-11-09 NOTE — Telephone Encounter (Signed)
-----   Message from Madelon Lips sent at 08/05/2022  1:19 PM EDT ----- Regarding: Remains tobacco FREE?    Quit 12/2021

## 2022-11-09 NOTE — Telephone Encounter (Signed)
Patient contacted for follow/up of tobacco cessation history of multiple months who at last PCP visit was noted to have relapsed.   Since last contact patient reports had trouble with "menopause", anxiety and stress.  She reports a return to smoking ~ 1 ppd.   Medications currently being used; None yet but has been prescribed patches and is planning to restart patches soon.  She plans to quit completely at that time.   Continues to rates IMPORTANCE of quitting tobacco as high.   Most common triggers to use tobacco include; stress and worry.   Motivation to quit: health, breathing. Patient is participating in a Managed Medicaid Plan:  Yes  Total time with patient call and documentation of interaction: 16 minutes. Follow-up phone call planned: 2 months.

## 2022-11-10 NOTE — Telephone Encounter (Signed)
Reviewed and agree with Dr Koval's plan.   

## 2022-11-17 ENCOUNTER — Ambulatory Visit: Payer: Medicaid Other

## 2022-12-07 ENCOUNTER — Telehealth: Payer: Self-pay

## 2022-12-07 NOTE — Telephone Encounter (Signed)
Medicaid Managed Care   Unsuccessful Outreach Note  12/07/2022 Name: [name] MRN: [id number] DOB: 1965-05-21  Referred by: Elberta Fortis, MD Reason for referral : No chief complaint on file.   An unsuccessful telephone outreach was attempted today. The patient was referred to the case management team for assistance with care management and care coordination.   Follow Up Plan: If patient returns call to provider office, please advise to call Embedded Care Management Care Guide Nicholes Rough* at [phone]*  Nicholes Rough, CMA Care Guide [employer]

## 2022-12-10 ENCOUNTER — Ambulatory Visit: Payer: Medicaid Other

## 2022-12-15 ENCOUNTER — Other Ambulatory Visit: Payer: Self-pay | Admitting: Family Medicine

## 2022-12-15 DIAGNOSIS — R232 Flushing: Secondary | ICD-10-CM

## 2022-12-18 ENCOUNTER — Encounter (HOSPITAL_COMMUNITY): Payer: Self-pay | Admitting: Emergency Medicine

## 2022-12-18 ENCOUNTER — Ambulatory Visit (HOSPITAL_COMMUNITY)
Admission: EM | Admit: 2022-12-18 | Discharge: 2022-12-18 | Disposition: A | Discharge status: Home / Self Care | Payer: Medicaid Other | Attending: Emergency Medicine | Admitting: Emergency Medicine

## 2022-12-18 ENCOUNTER — Ambulatory Visit (INDEPENDENT_AMBULATORY_CARE_PROVIDER_SITE_OTHER): Payer: Medicaid Other

## 2022-12-18 DIAGNOSIS — M25562 Pain in left knee: Secondary | ICD-10-CM

## 2022-12-18 DIAGNOSIS — M25511 Pain in right shoulder: Secondary | ICD-10-CM | POA: Diagnosis not present

## 2022-12-18 DIAGNOSIS — M25561 Pain in right knee: Secondary | ICD-10-CM

## 2022-12-18 DIAGNOSIS — W19XXXA Unspecified fall, initial encounter: Secondary | ICD-10-CM

## 2022-12-18 DIAGNOSIS — R0789 Other chest pain: Secondary | ICD-10-CM

## 2022-12-18 MED ORDER — KETOROLAC TROMETHAMINE 30 MG/ML IJ SOLN
INTRAMUSCULAR | Status: AC
  Filled 2022-12-18: qty 1

## 2022-12-18 MED ORDER — KETOROLAC TROMETHAMINE 60 MG/2ML IM SOLN
30.0000 mg | Freq: Once | INTRAMUSCULAR | Status: AC
  Administered 2022-12-18: 30 mg via INTRAMUSCULAR

## 2022-12-18 NOTE — ED Provider Notes (Signed)
[facility]    CSN: [id number] Arrival date & time: 12/18/22  1137      History   Chief Complaint Chief Complaint  Patient presents with   Fall    HPI [name] is a 57 y.o. female.   Yesterday patient was going into the grocery store and trying to grab a cart around 12:00, she tripped on a metal piece on the floor. She reports falling flat on her chest, abdomen, right shoulder, and knees. Pain in bilateral knees, right shoulder, right chest area around collarbone, bilateral arms, abdominal area, neck and back.   She did not hit her head.  Denies any loss of consciousness or syncope.  Reports she felt a pop in her shoulder afterwards and refuses to lift her shoulder up above her head due to the pain.  She is ambulatory.  Took ibuprofen yesterday.     The history is provided by the patient and medical records.  Fall Pertinent negatives include no headaches.    Past Medical History:  Diagnosis Date   Allergy    Anxiety    Arthritis    knees,hands, hip  pt. Denies   Cellulitis    Colon polyps 2013   SESSIL SERRATED ADENOMA (ALL FRAGMENTS)   Complication of anesthesia    Lowered BP had to keep pt. overnight   COPD (chronic obstructive pulmonary disease) (HCC)    Depression    Fibroadenosis breast    GERD (gastroesophageal reflux disease)    does not take med   Heart murmur    as child  pt. 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   Laryngopharyngeal reflux (LPR) 03/04/2020   Tobacco use disorder 03/04/2020   Overactive bladder 01/01/2020   Menopause 03/21/2019   Chronic diarrhea 01/08/2019    Vaginal dryness, menopausal 10/29/2016   Restless legs 02/23/2016   Chronic pain 07/02/2015   Spondylosis of lumbar region without myelopathy or radiculopathy 12/13/2014   GERD (gastroesophageal reflux disease) 06/24/2011   Obesity (BMI 30-39.9) 01/22/2011   Anxiety and depression 10/24/2009   COPD (chronic obstructive pulmonary disease) (HCC) 05/09/2009   Hyperlipidemia 01/05/2008   Lymphedema 01/04/2007   HIDRADENITIS SUPPURATIVA 08/17/2006   Hypothyroidism 05/05/2006   Episodic mood disorder (HCC) 05/05/2006    Past Surgical History:  Procedure Laterality Date   COLONOSCOPY  2013   cyst removed     behind left knee and left cheek   ENDOMETRIAL ABLATION     ESOPHAGOGASTRODUODENOSCOPY  2013   normal   EYE SURGERY     bilarteral   HYDRADENITIS EXCISION Right 09/13/2017   Procedure: EXCISION HIDRADENITIS GROIN;  Surgeon: Berna Bue, MD;  Location: [facility];  Service: General;  Laterality: Right;  LMA   INCISION AND DRAINAGE ABSCESS / HEMATOMA OF BURSA / KNEE / THIGH     right upper thigh - 2008   NORPLANT REMOVAL     right upper arm - 12/2001   svd     x 2   THYROID SURGERY  2004   removed   TUBAL  LIGATION     2003   WISDOM TOOTH EXTRACTION      OB History     Gravida  4   Para  2   Term  1   Preterm  1   AB  2   Living  2      SAB      IAB  2   Ectopic      Multiple      Live Births               Home Medications    Prior to Admission medications   Medication Sig Start Date End Date Taking? Authorizing Provider  buPROPion (WELLBUTRIN XL) 300 MG 24 hr tablet Take 1 tablet (300 mg total) by mouth daily. 09/13/22   Elberta Fortis, MD  clobetasol ointment (TEMOVATE) 0.05 % APPLY TOPICALLY TWICE DAILY FOR 14 DAYS, THEN BACK OFF TO ONCE DAILY AND THEN AS NEEDED 09/13/22   Elberta Fortis, MD  dexlansoprazole (DEXILANT) 60 MG capsule TAKE 1 CAPSULE(60 MG) BY MOUTH DAILY 11/04/22   Elberta Fortis, MD  Elastic Bandages & Supports (MEDICAL  COMPRESSION STOCKINGS) MISC 2 Units by Does not apply route daily. 12/04/19   Leeroy Bock, MD  famotidine (PEPCID) 40 MG tablet Take 1 tablet (40 mg total) by mouth 2 (two) times daily. 04/28/21   Unk Lightning, PA  fluticasone (FLONASE) 50 MCG/ACT nasal spray SHAKE LIQUID AND USE 1 SPRAY IN EACH NOSTRIL EVERY DAY 02/04/21   Sabino Dick, DO  furosemide (LASIX) 40 MG tablet TAKE 1 TABLET(40 MG) BY MOUTH DAILY 11/30/21   Sabino Dick, DO  glycerin adult 2 g suppository Place 1 suppository rectally as needed for constipation. 06/11/22   Sabino Dick, DO  hydrOXYzine (ATARAX) 50 MG tablet TAKE 1 TABLET(50 MG) BY MOUTH THREE TIMES DAILY AS NEEDED FOR ANXIETY 06/30/22   Sabino Dick, DO  ibuprofen (ADVIL) 800 MG tablet Take 1 tablet (800 mg total) by mouth every 12 (twelve) hours as needed for moderate pain. 11/04/22   Elberta Fortis, MD  levothyroxine (SYNTHROID) 150 MCG tablet Take 1 tablet (150 mcg total) by mouth daily before breakfast. 09/13/22   Elberta Fortis, MD  lidocaine (XYLOCAINE) 2 % solution SMARTSIG:By Mouth 10/13/21   [provider]  nicotine (NICODERM CQ - DOSED IN MG/24 HOURS) 21 mg/24hr patch Place 1 patch (21 mg total) onto the skin daily. 11/04/22   Elberta Fortis, MD  ondansetron (ZOFRAN) 4 MG tablet Take 1 tablet (4 mg total) by mouth every 8 (eight) hours as needed for nausea or vomiting. 09/22/22   Bess Kinds, MD  polyethylene glycol powder (GLYCOLAX/MIRALAX) 17 GM/SCOOP powder Take 17 g by mouth 2 (two) times daily as needed. 05/30/20   Leeroy Bock, MD  pravastatin (PRAVACHOL) 40 MG tablet Take 1 tablet (40 mg total) by mouth daily. 11/04/22   Elberta Fortis, MD  pregabalin (LYRICA) 225 MG capsule Take 1 capsule (225 mg total) by mouth 2 (two) times daily. 09/13/22   Elberta Fortis, MD  QUEtiapine (SEROQUEL) 25 MG tablet Take 2 tablets (50 mg total) by mouth at bedtime. 10/15/22   Elberta Fortis, MD  SPIRIVA HANDIHALER  18 MCG inhalation capsule PLACE 1 CAPSULE INTO THE INHALER AND INHALE DAILY AS NEEDED 09/13/22   Elberta Fortis, MD  venlafaxine XR (EFFEXOR-XR) 37.5 MG 24 hr capsule TAKE 1 CAPSULE(37.5 MG) BY MOUTH DAILY WITH BREAKFAST 12/15/22   Elberta Fortis, MD  VENTOLIN HFA 108 [phone] Base) MCG/ACT inhaler INHALE 2  PUFFS INTO THE LUNGS EVERY 4 HOURS AS NEEDED FOR SHORTNESS OF BREATH. APPOINMENT NEEDED FOR ADDITIONAL REFILLS 09/13/22   Elberta Fortis, MD    Family History Family History  Problem Relation Age of Onset   Diabetes Father    Heart disease Father    Hyperlipidemia Father    Hypertension Father    Diabetes Mother    Hypertension Mother    Hyperlipidemia Mother    Diabetes Paternal Grandmother    Colon cancer Neg Hx    Esophageal cancer Neg Hx    Rectal cancer Neg Hx    Stomach cancer Neg Hx     Social History Social History   Tobacco Use   Smoking status: Former    Current packs/day: 0.00    Average packs/day: 0.5 packs/day for 25.0 years (12.5 ttl pk-yrs)    Types: Cigarettes    Start date: 06/06/1996    Quit date: 06/06/2021    Years since quitting: 1.5   Smokeless tobacco: Never   Tobacco comments:    Quit Spring 2023 with use of bupropion.  Vaping Use   Vaping status: Never Used  Substance Use Topics   Alcohol use: Not Currently    Comment: rare   Drug use: Not Currently    Types: Marijuana    Comment: past hx 85yrs ago     Allergies   Vicodin [hydrocodone-acetaminophen]   Review of Systems Review of Systems  Musculoskeletal:  Positive for back pain, myalgias and neck pain. Negative for gait problem and joint swelling.  Neurological:  Negative for syncope and headaches.     Physical Exam Triage Vital Signs ED Triage Vitals  Encounter Vitals Group     BP 12/18/22 1232 119/87     Systolic BP Percentile --      Diastolic BP Percentile --      Pulse Rate 12/18/22 1232 68     Resp 12/18/22 1232 17     Temp 12/18/22 1232 98.1 F (36.7 C)     Temp Source  12/18/22 1232 Oral     SpO2 12/18/22 1232 97 %     Weight --      Height --      Head Circumference --      Peak Flow --      Pain Score 12/18/22 1230 10     Pain Loc --      Pain Education --      Exclude from Growth Chart --    No data found.  Updated Vital Signs BP 119/87 (BP Location: Left Arm)   Pulse 68   Temp 98.1 F (36.7 C) (Oral)   Resp 17   SpO2 97%   Visual Acuity Right Eye Distance:   Left Eye Distance:   Bilateral Distance:    Right Eye Near:   Left Eye Near:    Bilateral Near:     Physical Exam Vitals and nursing note reviewed.  Constitutional:      Appearance: Normal appearance.  HENT:     Head: Normocephalic and atraumatic.     Right Ear: External ear normal.     Left Ear: External ear normal.     Nose: Nose normal.     Mouth/Throat:     Mouth: Mucous membranes are moist.  Eyes:     Conjunctiva/sclera: Conjunctivae normal.  Cardiovascular:     Rate and Rhythm: Normal rate and regular rhythm.     Heart sounds: Normal heart sounds. No murmur heard. Pulmonary:  Effort: Pulmonary effort is normal. No respiratory distress.     Breath sounds: Normal breath sounds.  Chest:     Chest wall: Tenderness present. No deformity, swelling, crepitus or edema.       Comments: Right chest wall tenderness to palpation.  No obvious deformity, bruising or crepitus. Abdominal:     General: Abdomen is flat. Bowel sounds are normal.     Palpations: Abdomen is soft.     Tenderness: There is generalized abdominal tenderness.     Comments: Diffuse generalized abdominal tenderness.  Active bowel sounds.  Abdomen is soft.  Musculoskeletal:        General: Tenderness present. No swelling or deformity.     Right shoulder: Tenderness and bony tenderness present. No swelling, deformity or laceration. Decreased range of motion. Decreased strength. Normal pulse.     Right knee: Bony tenderness present. No deformity, effusion or ecchymosis. Normal range of motion.  Tenderness present.     Left knee: Bony tenderness present. No deformity, effusion or ecchymosis. Normal range of motion. Tenderness present.     Comments: Diffuse anterior and posterior tenderness to palpation of bilateral knees.  No obvious swelling, bruising or deformity. Ambulatory.   Skin:    General: Skin is warm and dry.     Findings: Bruising present.     Comments: Bruise to left FA. Abrasion to right elbow.   Neurological:     Mental Status: She is alert.  Psychiatric:        Behavior: Behavior is cooperative.      UC Treatments / Results  Labs (all labs ordered are listed, but only abnormal results are displayed) Labs Reviewed - No data to display  EKG   Radiology No results found.  Procedures Procedures (including critical care time)  Medications Ordered in UC Medications  ketorolac (TORADOL) injection 30 mg (has no administration in time range)    Initial Impression / Assessment and Plan / UC Course  I have reviewed the triage vital signs and the nursing notes.  Pertinent labs & imaging results that were available during my care of the patient were reviewed by me and considered in my medical decision making (see chart for details).  Vitals and triage reviewed, patient is hemodynamically stable.  Diffuse musculoskeletal tenderness without any obvious deformity. Restricted right shoulder ROM, patient refuses passive ROM evaluation. Ambulatory. Right chest wall TTP, no crepitus, lungs are vesicular. Images do not show any acute fractures or dislocations, awaiting official radiology over-read. Shoulder sling for comfort. IM Toradol for pain. Ortho f/u if needed. POC, f/u care and return precautions given, no questions at this time.      Final Clinical Impressions(s) / UC Diagnoses   Final diagnoses:  Fall, initial encounter  Acute pain of both knees  Acute pain of right shoulder  Right-sided chest wall pain     Discharge Instructions      Fortunately  your x-rays did not show any fractures or dislocations.  We have provided you a shoulder sling in clinic for comfort, you can wear this throughout the day and at night as needed for pain.  If your pain persist beyond the next week, follow-up with sports medicine.  We have given you a Toradol injection in clinic, you can take Tylenol if you have any breakthrough pain for the rest of the day.  Starting tomorrow you can alternate between 600 mg of ibuprofen and 500 mg of Tylenol every 6-8 hours.  For musculoskeletal pain I suggest gentle  stretching, warm compresses and heat.  For any knee pain or swelling you can use ice or heat.  Return to clinic for any new or urgent symptoms.      ED Prescriptions   None    PDMP not reviewed this encounter.   Mariabella Nilsen, [place], [place] 12/18/22 [phone]

## 2022-12-18 NOTE — ED Triage Notes (Signed)
Pt reports fell at grocery store yesterday when tried over cart return. C/o pain in bilateral knees, right arm and back. Tried IBU 800 mg.

## 2022-12-18 NOTE — Discharge Instructions (Addendum)
Fortunately your x-rays did not show any fractures or dislocations.  We have provided you a shoulder sling in clinic for comfort, you can wear this throughout the day and at night as needed for pain.  If your pain persist beyond the next week, follow-up with sports medicine.  We have given you a Toradol injection in clinic, you can take Tylenol if you have any breakthrough pain for the rest of the day.  Starting tomorrow you can alternate between 600 mg of ibuprofen and 500 mg of Tylenol every 6-8 hours.  For musculoskeletal pain I suggest gentle stretching, warm compresses and heat.  For any knee pain or swelling you can use ice or heat.  Return to clinic for any new or urgent symptoms.

## 2022-12-31 ENCOUNTER — Telehealth: Payer: Self-pay | Admitting: Family Medicine

## 2022-12-31 DIAGNOSIS — M5020 Other cervical disc displacement, unspecified cervical region: Secondary | ICD-10-CM

## 2022-12-31 DIAGNOSIS — M792 Neuralgia and neuritis, unspecified: Secondary | ICD-10-CM

## 2022-12-31 MED ORDER — PREGABALIN 225 MG PO CAPS: 225.0000 mg | ORAL_CAPSULE | Freq: Two times a day (BID) | ORAL | 2 refills | Status: DC

## 2022-12-31 NOTE — Telephone Encounter (Signed)
Patient is calling to request Lyrica being refilled. States she has called pharmacy to request refill but we have not received it.

## 2023-01-03 NOTE — Telephone Encounter (Signed)
Spoke with patient's daughter Guliana Sandmeyer) to inform her that the patient's rx has been sent to the pharmacy. Daughter explained she will tell her mother. Penni Bombard CMA

## 2023-01-06 ENCOUNTER — Encounter: Payer: Self-pay | Admitting: Family Medicine

## 2023-01-06 ENCOUNTER — Ambulatory Visit (INDEPENDENT_AMBULATORY_CARE_PROVIDER_SITE_OTHER): Payer: Medicaid Other | Admitting: Family Medicine

## 2023-01-06 ENCOUNTER — Other Ambulatory Visit: Payer: Self-pay

## 2023-01-06 VITALS — BP 128/88 | Ht 68.0 in | Wt 240.0 lb

## 2023-01-06 DIAGNOSIS — M25511 Pain in right shoulder: Secondary | ICD-10-CM

## 2023-01-06 DIAGNOSIS — S20211A Contusion of right front wall of thorax, initial encounter: Secondary | ICD-10-CM

## 2023-01-06 NOTE — Progress Notes (Signed)
DATE OF VISIT: 01/06/2023        [name] DOB: 1965-11-08 MRN: [id number]  CC:  Rt shoulder pain  History- [name] is a 57 y.o. Rt-hand dominant female for evaluation and treatment of Rt shoulder pain. Injury 12/18/22 - tripped and fell 12/18/22 while at [employer] - landed on chest and right side - did not hit her head Seen at [facility] 12/18/22  - Rt shoulder XR was normal/negative - given a sling to wear for comfort - given Toradol IM injection - recommended Ibuprofen and/or Tylenol prn  Today she reports ongoing pain in the anterior chest PMH significant gastritis - needed to stop ibuprofen due to stomach irritation - using Tylenol prn Notes sling they gave her in UC was uncomfortable Has tried some Voltaren gel to the chest, but not the shoulder area Denies radiation of the pain Denies numbness/tingling Denies weakness   Past Medical History Past Medical History:  Diagnosis Date   Allergy    Anxiety    Arthritis    knees,hands, hip  pt. Denies   Cellulitis    Colon polyps 2013   SESSIL SERRATED ADENOMA (ALL FRAGMENTS)   Complication of anesthesia    Lowered BP had to keep pt. overnight   COPD (chronic obstructive pulmonary disease) (HCC)    Depression    Fibroadenosis breast    GERD (gastroesophageal reflux disease)    does not take med   Heart murmur    as child  pt. denies   Hyperlipidemia    no meds - diet controlled   Hypothyroidism    Lymph edema    both legs and feet   Migraine    Plantar fasciitis    Syphilis Treated in early 2012 during hospitalization   Uterine fibroid    Varicose veins     Past Surgical History Past Surgical History:  Procedure Laterality Date   COLONOSCOPY  2013   cyst removed     behind left knee and left cheek   ENDOMETRIAL ABLATION     ESOPHAGOGASTRODUODENOSCOPY  2013   normal   EYE SURGERY     bilarteral   HYDRADENITIS EXCISION Right 09/13/2017   Procedure: EXCISION HIDRADENITIS GROIN;  Surgeon: Berna Bue, MD;  Location: [facility];  Service: General;  Laterality: Right;  LMA   INCISION AND DRAINAGE ABSCESS / HEMATOMA OF BURSA / KNEE / THIGH     right upper thigh - 2008   NORPLANT REMOVAL     right upper arm - 12/2001   svd     x 2   THYROID SURGERY  2004   removed   TUBAL LIGATION     2003   WISDOM TOOTH EXTRACTION      Medications Current Outpatient Medications  Medication Sig Dispense Refill   buPROPion (WELLBUTRIN XL) 300 MG 24 hr tablet Take 1 tablet (300 mg total) by mouth daily. 30 tablet 0   clobetasol ointment (TEMOVATE) 0.05 % APPLY TOPICALLY TWICE DAILY FOR 14 DAYS, THEN BACK OFF TO ONCE DAILY AND THEN AS NEEDED 60 g 0   dexlansoprazole (DEXILANT) 60 MG capsule TAKE 1 CAPSULE(60 MG) BY MOUTH DAILY 90 capsule 0   Elastic Bandages & Supports (MEDICAL COMPRESSION STOCKINGS) MISC 2 Units by Does not apply route daily. 2 each 0   famotidine (PEPCID) 40 MG tablet Take 1 tablet (40 mg total) by mouth 2 (two) times daily. 60 tablet 5   fluticasone (FLONASE) 50 MCG/ACT nasal spray SHAKE LIQUID  AND USE 1 SPRAY IN EACH NOSTRIL EVERY DAY 16 g 12   furosemide (LASIX) 40 MG tablet TAKE 1 TABLET(40 MG) BY MOUTH DAILY 90 tablet 0   glycerin adult 2 g suppository Place 1 suppository rectally as needed for constipation. 12 suppository 0   hydrOXYzine (ATARAX) 50 MG tablet TAKE 1 TABLET(50 MG) BY MOUTH THREE TIMES DAILY AS NEEDED FOR ANXIETY 90 tablet 1   ibuprofen (ADVIL) 800 MG tablet Take 1 tablet (800 mg total) by mouth every 12 (twelve) hours as needed for moderate pain. 60 tablet 0   levothyroxine (SYNTHROID) 150 MCG tablet Take 1 tablet (150 mcg total) by mouth daily before breakfast. 90 tablet 0   lidocaine (XYLOCAINE) 2 % solution SMARTSIG:By Mouth     nicotine (NICODERM CQ - DOSED IN MG/24 HOURS) 21 mg/24hr patch Place 1 patch (21 mg total) onto the skin daily. 28 patch 0   ondansetron (ZOFRAN) 4 MG tablet Take 1 tablet (4 mg total) by mouth every 8 (eight) hours as needed for  nausea or vomiting. 20 tablet 0   polyethylene glycol powder (GLYCOLAX/MIRALAX) 17 GM/SCOOP powder Take 17 g by mouth 2 (two) times daily as needed. 3350 g 1   pravastatin (PRAVACHOL) 40 MG tablet Take 1 tablet (40 mg total) by mouth daily. 90 tablet 3   pregabalin (LYRICA) 225 MG capsule Take 1 capsule (225 mg total) by mouth 2 (two) times daily. 60 capsule 2   QUEtiapine (SEROQUEL) 25 MG tablet Take 2 tablets (50 mg total) by mouth at bedtime. 90 tablet 1   SPIRIVA HANDIHALER 18 MCG inhalation capsule PLACE 1 CAPSULE INTO THE INHALER AND INHALE DAILY AS NEEDED 30 capsule 2   venlafaxine XR (EFFEXOR-XR) 37.5 MG 24 hr capsule TAKE 1 CAPSULE(37.5 MG) BY MOUTH DAILY WITH BREAKFAST 30 capsule 3   VENTOLIN HFA 108 (90 Base) MCG/ACT inhaler INHALE 2 PUFFS INTO THE LUNGS EVERY 4 HOURS AS NEEDED FOR SHORTNESS OF BREATH. APPOINMENT NEEDED FOR ADDITIONAL REFILLS 18 g 1   No current facility-administered medications for this visit.    Allergies is allergic to vicodin [hydrocodone-acetaminophen].  Family History - reviewed per EMR and intake form  Social History   reports that she does not currently use alcohol.  reports that she quit smoking about 19 months ago. Her smoking use included cigarettes. She started smoking about 26 years ago. She has a 12.5 pack-year smoking history. She has never used smokeless tobacco.  reports that she does not currently use drugs after having used the following drugs: Marijuana. OCCUPATION: not working - on disability   EXAM: Vitals: BP 128/88   Ht 5\' 8"  (1.727 m)   Wt 240 lb (108.9 kg)   BMI 36.49 kg/m  General: AOx3, NAD, pleasant SKIN: no rashes or lesions, skin clean, dry, intact MSK: CSPINE: Full range of motion without pain, no midline or paraspinal tenderness.  Does have well-healed anterior surgical scar Shoulder: Right shoulder without gross deformity.  She has full range of motion with positive painful arc.  She is tender to palpation over the [facility]  joint, bicipital groove, anterior chest wall.  She has no posterior tenderness.  She has mildly positive empty can, negative speeds, mildly positive Hawkins and Neer.  Negative drop arm.  Rotator cuff strength 5-/5 throughout She has no palpable deformity along the clavicle.  No palpable defects along the chest wall Left shoulder with full range of motion without pain, weakness, instability Normal grip strength  NEURO: sensation intact to light  touch, DTR + 2/4 bicep, tricep, brachioradialis bilaterally VASC: pulses 2+ and symmetric radial artery bilaterally, no edema  IMAGING: XRAYS:  Right shoulder x-ray from 12/18/2022 at urgent care personally reviewed and interpreted by me today showing: No acute bony abnormality, no signs of fracture  LIMITED RT SHOULDER MSK U/S today by me showing: -Bicep: Hypoechoic change, small amount of fluid around the bicep tendon.  No signs of tearing -Pectoralis: Normal pectoralis insertion on the proximal humerus.  No signs of tearing.  Normal-appearing pectoralis muscle and the chest wall.  No visible defects or abnormalities -No significant increased fluid in the subacromial space -Clavicle: She has degenerative spurring at the [facility] joint, small amount of increased fluid over the distal aspect of the clavicle at the [facility] joint, but no signs of fracture or other abnormality  Impression: -AC joint osteoarthritis with some increased fluid along the distal clavicle consistent with contusion -Biceps tendinopathy -No other abnormalities  Assessment & Plan Pain in joint of right shoulder Acute right anterior shoulder pain status post fall 12/18/2022 -X-ray showing no signs of fracture -MSK ultrasound showing some inflammation near the [facility] joint and mild biceps tendinopathy, but no other abnormalities -Sensation most consistent with healing contusion status post fall  Plan: -Urgent care notes from 12/18/2022 reviewed with brief summary as noted in the  HPI -Personally reviewed right shoulder x-ray completed 12/18/2022 at urgent care with findings as noted above. -MSK ultrasound today showing some inflammation near the [facility] joint and biceps tendinopathy, but no other abnormalities -Patient cannot tolerate p.o. NSAIDs due to history of gastritis.  Can use Voltaren gel every 6-8 hours as needed.  Also recommended trial of Salonpas lidocaine patch as needed -Fitted with a sling to use for comfort.  Advised to take arm out of sling regularly for range of motion and to avoid stiffness of the shoulder and the elbow.  Should only use for a few days and should not use regularly -Or ice as needed -Follow-up 4 to 6 weeks if not improving, sooner as needed.  Could consider further imaging or possible physical therapy if not improving Contusion of right chest wall, initial encounter Acute right anterior shoulder/chest wall pain status post fall 12/18/2022  Plan: -Urgent care notes from 12/18/2022 reviewed with brief summary as noted in the HPI -Personally reviewed right shoulder x-ray completed 12/18/2022 at urgent care with findings as noted above. -MSK ultrasound today showing some inflammation near the [facility] joint and biceps tendinopathy, but no other abnormalities -Patient cannot tolerate p.o. NSAIDs due to history of gastritis.  Can use Voltaren gel every 6-8 hours as needed.  Also recommended trial of Salonpas lidocaine patch as needed -Fitted with a sling to use for comfort.  Advised to take arm out of sling regularly for range of motion and to avoid stiffness of the shoulder and the elbow.  Should only use for a few days and should not use regularly -Or ice as needed -Follow-up 4 to 6 weeks if not improving, sooner as needed.  Could consider further imaging or possible physical therapy if not improving  Patient expressed understanding & agreement with above.  Encounter Diagnoses  Name Primary?   Pain in joint of right shoulder Yes   Contusion of  right chest wall, initial encounter     Orders Placed This Encounter  Procedures   [place] LIMITED JOINT SPACE STRUCTURES UP RIGHT    Orders Placed This Encounter  Procedures   [place] LIMITED JOINT SPACE STRUCTURES UP RIGHT

## 2023-01-06 NOTE — Patient Instructions (Signed)
Your right shoulder and chest pain is due to a healing chest contusion (bruise) from your fall - you can use your sling as needed to help with any pain.  You should come out of it regularly to move around your shoulder and elbow to avoid stiffness.  Do not use for more than a few days - You would benefit from the use of Voltaren gel every 6-hrs as needed to the area.  This should not aggravate your stomach/gastritis.  This is a topical anti-inflammatory medication to help with pain and inflammation/swelling.  You can purchase this at your local pharmacy over-the-counter. - you can use over-the-counter Salonpas Lidocaine patches to help with pain - you can apply heat or ice as needed - you should follow-up with me in 4-6 weeks if no improvement

## 2023-01-17 ENCOUNTER — Other Ambulatory Visit: Payer: Self-pay | Admitting: Family Medicine

## 2023-01-24 ENCOUNTER — Telehealth: Payer: Self-pay | Admitting: Pharmacist

## 2023-01-24 NOTE — Telephone Encounter (Signed)
Patient contacted for follow/up of tobacco intake reduction / cessation attempt.   NO answer X 2 attempts - following second call left HIPAA compliant voice mail requesting call back to direct phone: [phone]  Total time with patient call and documentation of interaction: 8 minutes.  Follow-up phone call planned: 1 month if she does not return call.

## 2023-02-28 ENCOUNTER — Other Ambulatory Visit: Payer: Self-pay

## 2023-02-28 DIAGNOSIS — L309 Dermatitis, unspecified: Secondary | ICD-10-CM

## 2023-02-28 MED ORDER — CLOBETASOL PROPIONATE 0.05 % EX OINT: TOPICAL_OINTMENT | CUTANEOUS | 0 refills | Status: DC

## 2023-02-28 NOTE — Telephone Encounter (Signed)
Patient calls nurse line requesting refills on clobetasol ointment and clobetasol cream.   She reports that cream works better on hands, while ointment is better on arms and elbows.   Advised patient that I was unsure if insurance would cover both prescriptions at the same time, however, would forward request to PCP.   Please advise.   Veronda Prude, RN

## 2023-03-10 ENCOUNTER — Telehealth: Payer: Self-pay | Admitting: Pharmacist

## 2023-03-10 ENCOUNTER — Telehealth: Payer: Self-pay | Admitting: Family Medicine

## 2023-03-10 NOTE — Telephone Encounter (Signed)
 Attempted to contact patient for follow-up of tobacco cessation   Unable to reach patient    Total time with patient call and documentation of interaction: 8 minutes.  Follow-up phone call planned: 1 month

## 2023-03-10 NOTE — Telephone Encounter (Signed)
-----   Message from Maude Lagos sent at 02/25/2023  4:29 PM EST ----- Regarding: FW: Tobacco Use 12/30 ----- Message ----- From: Lagos Maude MATSU, RPH-CPP Sent: 02/21/2023  12:00 AM EST To: Maude MATSU Lagos, RPH-CPP Subject: Tobacco Use                                    Attempted contact 11/18 - No answer

## 2023-03-10 NOTE — Telephone Encounter (Signed)
 Patient requests an exemption letter for jury duty. She states she cannot sit or stand for long periods of time. Patient call back is [phone]. Please advise.

## 2023-03-11 NOTE — Telephone Encounter (Signed)
 Tried calling both numbers on file, no answer. Unable to LVM, will try again later. Penni Bombard CMA

## 2023-03-14 NOTE — Telephone Encounter (Signed)
 Patient returning call. I relayed Dr. Dennard message to her and patient does not understand why this isn't doable. She would like for Dr. Theophilus to cal her personally to discuss this. I offered patient an appointment to discuss further but patient declined. Please advise.

## 2023-03-14 NOTE — Telephone Encounter (Signed)
 Called patient again no answer. Unable to LVM. Penni Bombard CMA

## 2023-03-15 ENCOUNTER — Encounter: Payer: Self-pay | Admitting: Family Medicine

## 2023-03-15 NOTE — Telephone Encounter (Signed)
 Called patient regarding disability information for jury duty.  Clarified that patient needs documentation supporting her disability and physical limitations but does not need formal paperwork for jury exemption completed.  Patient receiving long-term disability for chronic lymphedema and back pain, seen at our clinic for these concerns.  Provided letter documenting disability and limitations via MyChart and routed to RN team to print physical copy for patient to pick up at the office at her request.  Lauren Nap, DO

## 2023-03-27 ENCOUNTER — Other Ambulatory Visit: Payer: Self-pay | Admitting: Family Medicine

## 2023-03-27 DIAGNOSIS — E890 Postprocedural hypothyroidism: Secondary | ICD-10-CM

## 2023-04-14 ENCOUNTER — Encounter: Payer: Self-pay | Admitting: Pharmacist

## 2023-04-14 ENCOUNTER — Telehealth: Payer: Self-pay | Admitting: Pharmacist

## 2023-04-14 DIAGNOSIS — Z87891 Personal history of nicotine dependence: Secondary | ICD-10-CM

## 2023-04-14 NOTE — Telephone Encounter (Signed)
-----   Message from Maude Lagos sent at 03/10/2023  2:07 PM EST ----- Regarding: FW: Tobacco Use Unable to reach 03/10/2023 1 more attempt. ----- Message ----- From: Lagos Maude MATSU, RPH-CPP Sent: 03/07/2023  12:00 AM EST To: Maude MATSU Lagos, RPH-CPP Subject: FW: Tobacco Use                                12/30 ----- Message ----- From: Lagos Maude MATSU, RPH-CPP Sent: 02/21/2023  12:00 AM EST To: Maude MATSU Lagos, RPH-CPP Subject: Tobacco Use                                    Attempted contact 11/18 - No answer

## 2023-04-14 NOTE — Telephone Encounter (Signed)
 Reviewed and agree with Dr Macky Lower plan.

## 2023-04-14 NOTE — Telephone Encounter (Signed)
 Patient contacted for follow/up of tobacco cessation attempt.   Since last contact patient reports no smoking X 3-4 months!  Congratulated on success and encouraged continued abstinence from smoking.   No plans for scheduled follow-up with PCP at this time.  Updated patient problem list and smoking history as she has been quit for > 3 months.   Total time with patient call and documentation of interaction: 11 minutes. Follow-up phone call planned: 6 months

## 2023-04-14 NOTE — Assessment & Plan Note (Signed)
 Patient contacted for follow/up of tobacco cessation attempt.   Since last contact patient reports no smoking X 3-4 months!  Congratulated on success and encouraged continued abstinence from smoking.   No plans for scheduled follow-up with PCP at this time.  Updated patient problem list and smoking history as she has been quit for > 3 months.

## 2023-04-22 ENCOUNTER — Other Ambulatory Visit: Payer: Self-pay | Admitting: Family Medicine

## 2023-05-29 ENCOUNTER — Other Ambulatory Visit: Payer: Self-pay | Admitting: Family Medicine

## 2023-05-29 DIAGNOSIS — M549 Dorsalgia, unspecified: Secondary | ICD-10-CM

## 2023-06-08 ENCOUNTER — Other Ambulatory Visit: Payer: Self-pay | Admitting: Family Medicine

## 2023-06-08 DIAGNOSIS — L309 Dermatitis, unspecified: Secondary | ICD-10-CM

## 2023-06-30 ENCOUNTER — Other Ambulatory Visit: Payer: Self-pay | Admitting: Family Medicine

## 2023-06-30 DIAGNOSIS — M792 Neuralgia and neuritis, unspecified: Secondary | ICD-10-CM

## 2023-06-30 DIAGNOSIS — M5020 Other cervical disc displacement, unspecified cervical region: Secondary | ICD-10-CM

## 2023-06-30 DIAGNOSIS — Z1231 Encounter for screening mammogram for malignant neoplasm of breast: Secondary | ICD-10-CM

## 2023-07-14 ENCOUNTER — Encounter (HOSPITAL_COMMUNITY): Payer: Self-pay

## 2023-07-18 ENCOUNTER — Encounter: Payer: Self-pay | Admitting: Gastroenterology

## 2023-07-18 ENCOUNTER — Ambulatory Visit (INDEPENDENT_AMBULATORY_CARE_PROVIDER_SITE_OTHER)
Admission: RE | Admit: 2023-07-18 | Discharge: 2023-07-18 | Disposition: A | Discharge status: Home / Self Care | Source: Ambulatory Visit | Attending: Gastroenterology | Admitting: Gastroenterology

## 2023-07-18 ENCOUNTER — Ambulatory Visit (INDEPENDENT_AMBULATORY_CARE_PROVIDER_SITE_OTHER): Admitting: Gastroenterology

## 2023-07-18 VITALS — BP 128/72 | HR 80 | Ht 68.0 in | Wt 228.0 lb

## 2023-07-18 DIAGNOSIS — K5904 Chronic idiopathic constipation: Secondary | ICD-10-CM | POA: Diagnosis not present

## 2023-07-18 DIAGNOSIS — R1319 Other dysphagia: Secondary | ICD-10-CM | POA: Diagnosis not present

## 2023-07-18 DIAGNOSIS — R140 Abdominal distension (gaseous): Secondary | ICD-10-CM

## 2023-07-18 DIAGNOSIS — R1084 Generalized abdominal pain: Secondary | ICD-10-CM | POA: Diagnosis not present

## 2023-07-18 DIAGNOSIS — R112 Nausea with vomiting, unspecified: Secondary | ICD-10-CM

## 2023-07-18 DIAGNOSIS — R1310 Dysphagia, unspecified: Secondary | ICD-10-CM

## 2023-07-18 DIAGNOSIS — K219 Gastro-esophageal reflux disease without esophagitis: Secondary | ICD-10-CM

## 2023-07-18 DIAGNOSIS — K5900 Constipation, unspecified: Secondary | ICD-10-CM | POA: Diagnosis not present

## 2023-07-18 MED ORDER — ONDANSETRON 4 MG PO TBDP: 4.0000 mg | ORAL_TABLET | Freq: Every day | ORAL | 0 refills | Status: DC | PRN

## 2023-07-18 MED ORDER — DEXLANSOPRAZOLE 60 MG PO CPDR: DELAYED_RELEASE_CAPSULE | ORAL | 0 refills | Status: DC

## 2023-07-18 NOTE — Progress Notes (Signed)
 Addendum: Reviewed and agree with assessment and management plan. Asha Grumbine, Carie Caddy, MD

## 2023-07-18 NOTE — Patient Instructions (Addendum)
 Continue Dexilant  60mg  po daily Can add OTC Pepcid  as needed for breakthrough heartburn Avoid eating late at night 3-4 hours before bedtime Elevate head of bed at night  Take 1 Linzess capsule 30-45 minutes before first meal of the day, may have loose stool for the first week. Continue to use as your bowels should normalize. Call our office if you would like prescription.   Your provider has requested that you have an abdominal x ray before leaving today. Please go to the basement floor to our Radiology department for the test.   You have been scheduled for an endoscopy. Please follow written instructions given to you at your visit today.  If you use inhalers (even only as needed), please bring them with you on the day of your procedure.  If you take any of the following medications, they will need to be adjusted prior to your procedure:   DO NOT TAKE 7 DAYS PRIOR TO TEST- Trulicity (dulaglutide) Ozempic , Wegovy  (semaglutide ) Mounjaro (tirzepatide) Bydureon Bcise (exanatide extended release)  DO NOT TAKE 1 DAY PRIOR TO YOUR TEST Rybelsus  (semaglutide ) Adlyxin (lixisenatide) Victoza (liraglutide) Byetta (exanatide) ___________________________________________________________________________  Due to recent changes in healthcare laws, you may see the results of your imaging and laboratory studies on MyChart before your provider has had a chance to review them.  We understand that in some cases there may be results that are confusing or concerning to you. Not all laboratory results come back in the same time frame and the provider may be waiting for multiple results in order to interpret others.  Please give us  48 hours in order for your provider to thoroughly review all the results before contacting the office for clarification of your results.   _______________________________________________________  If your blood pressure at your visit was 140/90 or greater, please contact your  primary care physician to follow up on this.  _______________________________________________________  If you are age 30 or older, your body mass index should be between 23-30. Your Body mass index is 34.67 kg/m. If this is out of the aforementioned range listed, please consider follow up with your Primary Care Provider.  If you are age 53 or younger, your body mass index should be between 19-25. Your Body mass index is 34.67 kg/m. If this is out of the aformentioned range listed, please consider follow up with your Primary Care Provider.   ________________________________________________________  The [facility] providers would like to encourage you to use MYCHART to communicate with providers for non-urgent requests or questions.  Due to long hold times on the telephone, sending your provider a message by [facility] may be a faster and more efficient way to get a response.  Please allow 48 business hours for a response.  Please remember that this is for non-urgent requests.  _______________________________________________________  Thank you for trusting me with your gastrointestinal care. Deanna May, RNP

## 2023-07-18 NOTE — Progress Notes (Signed)
 Chief Complaint: abdominal pain, constipation, and nausea  Primary GI Doctor: Dr. Bridgett Camps  HPI:  [name],known to Dr. Bridgett Camps,  with past medical history of COPD, depression,anxiety, GERD, who presents for a complaint of abdominal pain, constipation, and nausea.  Last seen by Bridgette Campus , PA on 04/28/21 for epigastric pain and dysphagia. Patient scheduled for EGD.  History of GI procedures   05/22/21 EGD- normal mucosa in entire stomach and duodenum. Non- obstructing Schatzki ring. Dilated.  Path: Negative biopsies gastric and duodenal. No H. Pylori. 08/29/2019 EGD and colonoscopy. EGD with nonobstructing Schatzki's ring dilated with a 54 Maloney. Colonoscopy with 4 polyps and small internal hemorrhoids. Polyps were adenomatous and repeat recommended in 5 years.   Interval History    Patient presents today with main complaint of abdominal pain, nausea, and bloating.  Patient has had issues with constipation not relieved with glycerin  suppositories or OTC Miralax  so she started drinking prune juice which only helps sometimes. She states she has nausea associated with the constipation. She had one episode where she had to assist with the stool and felt she hurt or tore the skin around her rectum. It has since then resolved, but worried it will happen again. No nausea.      Patient has history of GERD and taking Dexilant  60 mg po daily and Pepcid . She has  had a few episodes of esophageal dysphagia over the course of the last 4 months where food gets stuck. Patient states she has had EGD with dilatation the past which has helped with the dysphagia.  Wt Readings from Last 3 Encounters:  07/18/23 228 lb (103.4 kg)  01/06/23 240 lb (108.9 kg)  11/04/22 227 lb (103 kg)    Past Medical History:  Diagnosis Date   Allergy    Anxiety    Arthritis    knees,hands, hip  pt. Denies   Cellulitis    Colon polyps 2013   SESSIL SERRATED ADENOMA (ALL FRAGMENTS)    Complication of anesthesia    Lowered BP had to keep pt. overnight   COPD (chronic obstructive pulmonary disease) (HCC)    Depression    Fibroadenosis breast    GERD (gastroesophageal reflux disease)    does not take med   Heart murmur    as child  pt. denies   Hyperlipidemia    no meds - diet controlled   Hypothyroidism    Lymph edema    both legs and feet   Migraine    Plantar fasciitis    Syphilis Treated in early 2012 during hospitalization   Uterine fibroid    Varicose veins     Past Surgical History:  Procedure Laterality Date   COLONOSCOPY  2013   cyst removed     behind left knee and left cheek   ENDOMETRIAL ABLATION     ESOPHAGOGASTRODUODENOSCOPY  2013   normal   EYE SURGERY     bilarteral   HYDRADENITIS EXCISION Right 09/13/2017   Procedure: EXCISION HIDRADENITIS GROIN;  Surgeon: Adalberto Acton, MD;  Location: [facility];  Service: General;  Laterality: Right;  LMA   INCISION AND DRAINAGE ABSCESS / HEMATOMA OF BURSA / KNEE / THIGH     right upper thigh - 2008   NORPLANT  REMOVAL     right upper arm - 12/2001   svd     x 2   THYROID  SURGERY  2004   removed   TUBAL LIGATION  2003   WISDOM TOOTH EXTRACTION      Current Outpatient Medications  Medication Sig Dispense Refill   buPROPion  (WELLBUTRIN  XL) 300 MG 24 hr tablet Take 1 tablet (300 mg total) by mouth daily. 30 tablet 0   clobetasol  ointment (TEMOVATE ) 0.05 % Daily as needed for eczema flare 60 g 0   dexlansoprazole  (DEXILANT ) 60 MG capsule TAKE 1 CAPSULE(60 MG) BY MOUTH DAILY 90 capsule 0   Elastic Bandages & Supports (MEDICAL COMPRESSION STOCKINGS) MISC 2 Units by Does not apply route daily. 2 each 0   hydrOXYzine  (ATARAX ) 50 MG tablet TAKE 1 TABLET(50 MG) BY MOUTH THREE TIMES DAILY AS NEEDED FOR ANXIETY 90 tablet 1   levothyroxine  (SYNTHROID ) 150 MCG tablet TAKE 1 TABLET(150 MCG) BY MOUTH DAILY BEFORE BREAKFAST 90 tablet 1   ondansetron  (ZOFRAN ) 4 MG tablet Take 1 tablet (4 mg total) by mouth every  8 (eight) hours as needed for nausea or vomiting. 20 tablet 0   pravastatin  (PRAVACHOL ) 40 MG tablet Take 1 tablet (40 mg total) by mouth daily. 90 tablet 3   pregabalin  (LYRICA ) 225 MG capsule TAKE 1 CAPSULE(225 MG) BY MOUTH TWICE DAILY 60 capsule 1   QUEtiapine  (SEROQUEL ) 25 MG tablet TAKE 2 TABLETS(50 MG) BY MOUTH AT BEDTIME 90 tablet 1   SPIRIVA  HANDIHALER 18 MCG inhalation capsule PLACE 1 CAPSULE INTO THE INHALER AND INHALE DAILY AS NEEDED 30 capsule 2   venlafaxine  XR (EFFEXOR -XR) 37.5 MG 24 hr capsule TAKE 1 CAPSULE(37.5 MG) BY MOUTH DAILY WITH BREAKFAST 30 capsule 3   VENTOLIN  HFA 108 (90 Base) MCG/ACT inhaler INHALE 2 PUFFS INTO THE LUNGS EVERY 4 HOURS AS NEEDED FOR SHORTNESS OF BREATH. APPOINMENT NEEDED FOR ADDITIONAL REFILLS 18 g 1   No current facility-administered medications for this visit.    Allergies as of 07/18/2023 - Review Complete 07/18/2023  Allergen Reaction Noted   Vicodin [hydrocodone -acetaminophen ] Itching 02/02/2011    Family History  Problem Relation Age of Onset   Diabetes Father    Heart disease Father    Hyperlipidemia Father    Hypertension Father    Diabetes Mother    Hypertension Mother    Hyperlipidemia Mother    Diabetes Paternal Grandmother    Colon cancer Neg Hx    Esophageal cancer Neg Hx    Rectal cancer Neg Hx    Stomach cancer Neg Hx     Review of Systems:    Constitutional: No weight loss, fever, chills, weakness or fatigue HEENT: Eyes: No change in vision               Ears, Nose, Throat:  No change in hearing or congestion Skin: No rash or itching Cardiovascular: No chest pain, chest pressure or palpitations   Respiratory: No SOB or cough Gastrointestinal: See HPI and otherwise negative Genitourinary: No dysuria or change in urinary frequency Neurological: No headache, dizziness or syncope Musculoskeletal: No new muscle or joint pain Hematologic: No bleeding or bruising Psychiatric: No history of depression or anxiety     Physical Exam:  Vital signs: BP 128/72   Pulse 80   Ht 5\' 8"  (1.727 m)   Wt 228 lb (103.4 kg)   BMI 34.67 kg/m   Constitutional:  Pleasant A.A  female appears to be in NAD, Well developed, Well nourished, alert and cooperative Throat: Oral cavity and pharynx without inflammation, swelling or lesion.  Respiratory: Respirations even and unlabored. Lungs clear to auscultation bilaterally.   No wheezes, crackles, or rhonchi.  Cardiovascular: Normal  S1, S2. Regular rate and rhythm. No peripheral edema, cyanosis or pallor.  Gastrointestinal:  Soft, nondistended, nontender. No rebound or guarding. Normal bowel sounds. No appreciable masses or hepatomegaly. Rectal:  Not performed.  Msk:  Symmetrical without gross deformities. Without edema, no deformity or joint abnormality.  Neurologic:  Alert and  oriented x4;  grossly normal neurologically.  Skin:   Dry and intact without significant lesions or rashes. Psychiatric: Oriented to person, place and time. Demonstrates good judgement and reason without abnormal affect or behaviors.  RELEVANT LABS AND IMAGING: CBC    Latest Ref Rng & Units 11/05/2022   12:22 PM 08/12/2020    2:19 PM 07/05/2019    3:46 PM  CBC  WBC 3.4 - 10.8 x10E3/uL 3.1  4.0  3.0   Hemoglobin 11.1 - 15.9 g/dL 82.9  56.2  13.0   Hematocrit 34.0 - 46.6 % 42.1  39.0  41.4   Platelets 150 - 450 x10E3/uL 210  246  263      CMP     Latest Ref Rng & Units 11/05/2022   12:22 PM 06/11/2022    3:24 PM 11/10/2021   12:19 PM  CMP  Glucose 70 - 99 mg/dL 89  95  91   BUN 6 - 24 mg/dL 12  10  10    Creatinine 0.57 - 1.00 mg/dL 8.65  7.84  6.96   Sodium 134 - 144 mmol/L 141  140  143   Potassium 3.5 - 5.2 mmol/L 4.0  4.5  4.4   Chloride 96 - 106 mmol/L 108  101  106   CO2 20 - 29 mmol/L 19  25  25    Calcium 8.7 - 10.2 mg/dL 8.5  9.0  9.2      Lab Results  Component Value Date   TSH 2.620 11/05/2022  07/21/22 CT ABD/pelvis W contrast-   IMPRESSION: No acute findings within the  abdomen or pelvis. Mild hepatic steatosis.  Stable small uterine fibroids.  Aortic Atherosclerosis (ICD10-I70.0).    Assessment: Encounter Diagnoses  Name Primary?   Esophageal dysphagia Yes   Chronic idiopathic constipation    Nausea and vomiting, unspecified vomiting type    Generalized abdominal pain    Bloating       58 year old African African female patient that presents with worsening constipation not relieved with over-the-counter glycerin  suppositories and over-the-counter MiraLAX  laxatives.  Will provide samples of pro secretory agent Linzess and if it is effective we will send patient prescription.  Will also order abdominal x-ray to rule out blockage due to nausea and vomiting.  I suspect if we can get her bowels moving more regularly this will help with the bloating and nausea along with abdominal pain.  Patient also complains of esophageal dysphagia over the course of the last few months.  She has history of EGD with dilatation s/p dysphagia which has been effective.  Will go ahead and schedule EGD with dilatation with Dr. Bridgett Camps.  Patient will continue Dexilant  p.o. daily and can use over-the-counter Pepcid  as needed for breakthrough symptoms.   Plan: -Samples of Linzess 145 mcg po daily provided  -Abdominal KUB 2 view today -Continue Dexilant  60 mg po daily -Can add OTC Pepcid  prn -Strict GERD diet, no late meals - Send script for Ondansetron  4mg  po prn nausea- short script - Schedule EGD with dilatation in [facility] with Dr. Bridgett Camps. The risks and benefits of EGD with possible biopsies and esophageal dilation were discussed with the patient who agrees to proceed.  Thank you for the courtesy of this consult. Please call me with any questions or concerns.   Lauren Ryle, FNP-C [facility] 07/18/2023, 9:45 AM  Cc: Jonne Netters, MD

## 2023-07-21 ENCOUNTER — Ambulatory Visit

## 2023-07-25 ENCOUNTER — Other Ambulatory Visit: Payer: Self-pay | Admitting: Family Medicine

## 2023-07-25 ENCOUNTER — Telehealth: Payer: Self-pay | Admitting: Gastroenterology

## 2023-07-25 DIAGNOSIS — K5904 Chronic idiopathic constipation: Secondary | ICD-10-CM

## 2023-07-25 MED ORDER — LINACLOTIDE 145 MCG PO CAPS: 145.0000 ug | ORAL_CAPSULE | Freq: Every day | ORAL | 3 refills | Status: AC

## 2023-07-25 NOTE — Telephone Encounter (Signed)
 Spoke with patient about her abd xray showing constipation. She has been taking the samples of Linzess  145 mcg which works well for her constipation. Will send prescription to her pharmancy. She enquires about the uterine fibroids and degenerative disease in lumbar. Recommended she follow-up with PCP and OB/GYN if she is having issues. Nausea managed with the ondansetron  ODT

## 2023-08-04 NOTE — Progress Notes (Signed)
    SUBJECTIVE:   CHIEF COMPLAINT / HPI:   Tobacco use  Esophageal dysphagia Seen by GI on 07/18/2023 due to worsening constipation despite OTC management.  Provided Linzess .  Also scheduled for EGD given history of prior EGD with dilation.  PERTINENT  PMH / PSH: Hypothyroidism, GERD, Anxiety, Depression, postmenopausal  OBJECTIVE:   There were no vitals taken for this visit. ***  General: NAD, pleasant, able to participate in exam Cardiac: RRR, no murmurs. Respiratory: CTAB, normal effort, No wheezes, rales or rhonchi Abdomen: Bowel sounds present, nontender, nondistended Extremities: no edema or cyanosis. Skin: warm and dry, no rashes noted Neuro: alert, no obvious focal deficits Psych: Normal affect and mood  ASSESSMENT/PLAN:   No problem-specific Assessment & Plan notes found for this encounter.     Dr. Jonne Netters, DO [facility]    {    This will disappear when note is signed, click to select method of visit    :1}

## 2023-08-05 ENCOUNTER — Encounter: Payer: Self-pay | Admitting: Family Medicine

## 2023-08-05 ENCOUNTER — Ambulatory Visit (INDEPENDENT_AMBULATORY_CARE_PROVIDER_SITE_OTHER): Admitting: Family Medicine

## 2023-08-05 VITALS — BP 110/86 | HR 64 | Ht 68.0 in | Wt 228.4 lb

## 2023-08-05 DIAGNOSIS — F419 Anxiety disorder, unspecified: Secondary | ICD-10-CM | POA: Diagnosis not present

## 2023-08-05 DIAGNOSIS — G8929 Other chronic pain: Secondary | ICD-10-CM | POA: Diagnosis not present

## 2023-08-05 DIAGNOSIS — F32A Depression, unspecified: Secondary | ICD-10-CM

## 2023-08-05 DIAGNOSIS — E785 Hyperlipidemia, unspecified: Secondary | ICD-10-CM

## 2023-08-05 DIAGNOSIS — R102 Pelvic and perineal pain: Secondary | ICD-10-CM

## 2023-08-05 DIAGNOSIS — R232 Flushing: Secondary | ICD-10-CM

## 2023-08-05 DIAGNOSIS — Z716 Tobacco abuse counseling: Secondary | ICD-10-CM

## 2023-08-05 DIAGNOSIS — M549 Dorsalgia, unspecified: Secondary | ICD-10-CM

## 2023-08-05 DIAGNOSIS — E039 Hypothyroidism, unspecified: Secondary | ICD-10-CM

## 2023-08-05 LAB — POCT GLYCOSYLATED HEMOGLOBIN (HGB A1C): Hemoglobin A1C: 5.3 % (ref 4.0–5.6)

## 2023-08-05 MED ORDER — VENLAFAXINE HCL ER 37.5 MG PO CP24: 37.5000 mg | ORAL_CAPSULE | Freq: Every day | ORAL | 3 refills | Status: AC

## 2023-08-05 MED ORDER — BUPROPION HCL ER (XL) 300 MG PO TB24: 300.0000 mg | ORAL_TABLET | Freq: Every day | ORAL | 0 refills | Status: DC

## 2023-08-05 MED ORDER — HYDROXYZINE HCL 50 MG PO TABS: 50.0000 mg | ORAL_TABLET | Freq: Two times a day (BID) | ORAL | 1 refills | Status: AC | PRN

## 2023-08-05 NOTE — Assessment & Plan Note (Signed)
 Annual TSH

## 2023-08-05 NOTE — Assessment & Plan Note (Signed)
 Lipid panel, A1c and BMP

## 2023-08-05 NOTE — Patient Instructions (Signed)
 It was wonderful to see you today! Thank you for choosing [facility].   Please bring ALL of your medications with you to every visit.   Today we talked about:  You can call Dr. Jeni Mitten with [facility] to help you with your bone spur I referred you to a pain management doctor and as I said we need to get low back x-rays before getting an MRI. You can have these done at [facility] when you have your appointment. For your chronic pelvic pain and fibriods we can get a pelvic ultrasound done to assess your symptoms further.  I refilled all of your depression and anxiety medications. I also referred you to psychiatry that can also hopefully help better manage your symptoms. I will do more research about the service animal as well. We are getting your yearly blood work today. You will see the results on MyChart and I will follow up if anything is abnormal.  Please follow up in 1-2 months   We are checking some labs today. If they are abnormal, I will call you. If they are normal, I will send you a MyChart message (if it is active) or a letter in the mail. If you do not hear about your labs in the next 2 weeks, please call the office.  Call the clinic at [phone] if your symptoms worsen or you have any concerns.  Please be sure to schedule follow up at the front desk before you leave today.   Jonne Netters, DO Family Medicine

## 2023-08-05 NOTE — Assessment & Plan Note (Signed)
 Uncontrolled despite venlafaxine , hydroxyzine  and quetiapine , significantly anxious upon exam today. Would benefit from psychiatry evaluation given difficult to control symptoms, patient hesitant but agreeable. PHQ:20, denies SI. -Refill current regimen -Referral to Psychiatry

## 2023-08-06 LAB — LIPID PANEL
Chol/HDL Ratio: 3.2 ratio (ref 0.0–4.4)
Cholesterol, Total: 263 mg/dL — ABNORMAL HIGH (ref 100–199)
HDL: 81 mg/dL (ref >39)
LDL Chol Calc (NIH): 167 mg/dL — ABNORMAL HIGH (ref 0–99)
Triglycerides: 87 mg/dL (ref 0–149)
VLDL Cholesterol Cal: 15 mg/dL (ref 5–40)

## 2023-08-06 LAB — TSH RFX ON ABNORMAL TO FREE T4: TSH: 8.12 u[IU]/mL — ABNORMAL HIGH (ref 0.450–4.500)

## 2023-08-06 LAB — BASIC METABOLIC PANEL WITH GFR
BUN/Creatinine Ratio: 17 (ref 9–23)
BUN: 16 mg/dL (ref 6–24)
CO2: 22 mmol/L (ref 20–29)
Calcium: 9.1 mg/dL (ref 8.7–10.2)
Chloride: 103 mmol/L (ref 96–106)
Creatinine, Ser: 0.96 mg/dL (ref 0.57–1.00)
Glucose: 82 mg/dL (ref 70–99)
Potassium: 4.9 mmol/L (ref 3.5–5.2)
Sodium: 141 mmol/L (ref 134–144)
eGFR: 69 mL/min/{1.73_m2} (ref >59)

## 2023-08-06 LAB — T4F: T4,Free (Direct): 0.99 ng/dL (ref 0.82–1.77)

## 2023-08-10 ENCOUNTER — Encounter: Payer: Self-pay | Admitting: Family Medicine

## 2023-08-16 ENCOUNTER — Ambulatory Visit: Admitting: Internal Medicine

## 2023-08-16 ENCOUNTER — Encounter: Payer: Self-pay | Admitting: Internal Medicine

## 2023-08-16 ENCOUNTER — Ambulatory Visit (HOSPITAL_COMMUNITY): Payer: Self-pay | Admitting: Student

## 2023-08-16 ENCOUNTER — Encounter: Payer: Self-pay | Admitting: Physical Medicine & Rehabilitation

## 2023-08-16 ENCOUNTER — Other Ambulatory Visit: Payer: Self-pay | Admitting: Internal Medicine

## 2023-08-16 VITALS — BP 109/62 | HR 48 | Temp 97.8°F | Resp 11 | Ht 68.0 in | Wt 228.0 lb

## 2023-08-16 DIAGNOSIS — K222 Esophageal obstruction: Secondary | ICD-10-CM

## 2023-08-16 DIAGNOSIS — K219 Gastro-esophageal reflux disease without esophagitis: Secondary | ICD-10-CM

## 2023-08-16 DIAGNOSIS — R1310 Dysphagia, unspecified: Secondary | ICD-10-CM

## 2023-08-16 DIAGNOSIS — R1084 Generalized abdominal pain: Secondary | ICD-10-CM

## 2023-08-16 DIAGNOSIS — R1319 Other dysphagia: Secondary | ICD-10-CM

## 2023-08-16 MED ORDER — DIAZEPAM 10 MG PO TABS: 10.0000 mg | ORAL_TABLET | Freq: Two times a day (BID) | ORAL | 0 refills | Status: DC | PRN

## 2023-08-16 MED ORDER — SODIUM CHLORIDE 0.9 % IV SOLN: 500.0000 mL | Freq: Once | INTRAVENOUS | Status: DC

## 2023-08-16 NOTE — Patient Instructions (Signed)
 Resume previous diet Continue present medications including Dexilant  60mg  daily YOU HAD AN ENDOSCOPIC PROCEDURE TODAY AT THE [facility]:   Refer to the procedure report that was given to you for any specific questions about what was found during the examination.  If the procedure report does not answer your questions, please call your gastroenterologist to clarify.  If you requested that your care partner not be given the details of your procedure findings, then the procedure report has been included in a sealed envelope for you to review at your convenience later.  YOU SHOULD EXPECT: Some feelings of bloating in the abdomen. Passage of more gas than usual.  Walking can help get rid of the air that was put into your GI tract during the procedure and reduce the bloating. If you had a lower endoscopy (such as a colonoscopy or flexible sigmoidoscopy) you may notice spotting of blood in your stool or on the toilet paper. If you underwent a bowel prep for your procedure, you may not have a normal bowel movement for a few days.  Please Note:  You might notice some irritation and congestion in your nose or some drainage.  This is from the oxygen used during your procedure.  There is no need for concern and it should clear up in a day or so.  SYMPTOMS TO REPORT IMMEDIATELY:  Following upper endoscopy (EGD)  Vomiting of blood or coffee ground material  New chest pain or pain under the shoulder blades  Painful or persistently difficult swallowing  New shortness of breath  Fever of 100F or higher  Black, tarry-looking stools  For urgent or emergent issues, a gastroenterologist can be reached at any hour by calling [phone]. Do not use MyChart messaging for urgent concerns.   DIET:  We do recommend a small meal at first, but then you may proceed to your regular diet.  Drink plenty of fluids but you should avoid alcoholic beverages for 24 hours.  ACTIVITY:  You should plan to take it  easy for the rest of today and you should NOT DRIVE or use heavy machinery until tomorrow (because of the sedation medicines used during the test).    FOLLOW UP: Our staff will call the number listed on your records the next business day following your procedure.  We will call around 7:15- 8:00 am to check on you and address any questions or concerns that you may have regarding the information given to you following your procedure. If we do not reach you, we will leave a message.     If any biopsies were taken you will be contacted by phone or by letter within the next 1-3 weeks.  Please call us  at [phone] if you have not heard about the biopsies in 3 weeks.   SIGNATURES/CONFIDENTIALITY: You and/or your care partner have signed paperwork which will be entered into your electronic medical record.  These signatures attest to the fact that that the information above on your After Visit Summary has been reviewed and is understood.  Full responsibility of the confidentiality of this discharge information lies with you and/or your care-partner.

## 2023-08-16 NOTE — Progress Notes (Signed)
 Pt's states no medical or surgical changes since previsit or office visit.

## 2023-08-16 NOTE — Op Note (Signed)
 [facility] Patient Name: [name] Procedure Date: 08/16/2023 2:22 PM MRN: [id number] Endoscopist: Nannette Babe , MD, [id number] Age: 58 Referring MD:  Date of Birth: 07-28-1965 Gender: Female Account #: [id number] Procedure:                Upper GI endoscopy Indications:              Dysphagia -- responsive to dilation last in 2023                            ([address] Rival), Gastro-esophageal reflux disease,                            Nausea with vomiting (this has improved with                            Linzess  and improved constipation) Medicines:                Monitored Anesthesia Care Procedure:                Pre-Anesthesia Assessment:                           - Prior to the procedure, a History and Physical                            was performed, and patient medications and                            allergies were reviewed. The patient's tolerance of                            previous anesthesia was also reviewed. The risks                            and benefits of the procedure and the sedation                            options and risks were discussed with the patient.                            All questions were answered, and informed consent                            was obtained. Prior Anticoagulants: The patient has                            taken no anticoagulant or antiplatelet agents. ASA                            Grade Assessment: II - A patient with mild systemic                            disease. After reviewing the risks and benefits,  the patient was deemed in satisfactory condition to                            undergo the procedure.                           After obtaining informed consent, the endoscope was                            passed under direct vision. Throughout the                            procedure, the patient's blood pressure, pulse, and                            oxygen saturations were  monitored continuously. The                            Olympus scope [id number] was introduced through the                            mouth, and advanced to the second part of duodenum.                            The upper GI endoscopy was accomplished without                            difficulty. The patient tolerated the procedure                            well. Scope In: Scope Out: Findings:                 Segmental mild mucosal changes characterized by                            sloughing were found in the distal esophagus.                            Consistent with reflux.                           One benign-appearing, intrinsic moderate                            (circumferential scarring or stenosis; an endoscope                            may pass) stenosis was found in the distal                            esophagus. This stenosis measured 1.4 cm (inner                            diameter). The stenosis was traversed. A TTS  dilator was passed through the scope. Dilation with                            a 15-16.5-18 mm balloon dilator was performed to 18                            mm. The dilation site was examined and showed mild                            mucosal disruption.                           The entire examined stomach was normal.                           The examined duodenum was normal. Complications:            No immediate complications. Estimated Blood Loss:     Estimated blood loss was minimal. Impression:               - Mucosal changes in the distal esophagus                            consistent with reflux.                           - Benign-appearing esophageal stenosis. Dilated to                            18 mm with balloon.                           - Normal stomach.                           - Normal examined duodenum.                           - No specimens collected. Recommendation:           - Patient has a contact  number available for                            emergencies. The signs and symptoms of potential                            delayed complications were discussed with the                            patient. Return to normal activities tomorrow.                            Written discharge instructions were provided to the                            patient.                           -  Resume previous diet.                           - Continue present medications including Dexilant                             60 mg daily.                           - After last dilation patient had anxiousness from                            dilation sensation. Will give diazepam 10 mg to use                            every 12 hours PRN; #5. Nannette Babe, MD 08/16/2023 2:59:55 PM This report has been signed electronically.

## 2023-08-16 NOTE — Progress Notes (Signed)
 Vss nad trans to pacu

## 2023-08-16 NOTE — Progress Notes (Signed)
 Called to room to assist during endoscopic procedure.  Patient ID and intended procedure confirmed with present staff. Received instructions for my participation in the procedure from the performing physician.

## 2023-08-16 NOTE — Progress Notes (Signed)
 Please see office note dated 07/18/2018 for details and current H&P  Patient coming for upper endoscopy to evaluate nausea, vomiting and dysphagia symptom  She is appropriate for upper endoscopy and possible dilation.

## 2023-08-17 ENCOUNTER — Telehealth: Payer: Self-pay | Admitting: *Deleted

## 2023-08-17 ENCOUNTER — Ambulatory Visit: Admitting: Family Medicine

## 2023-08-17 ENCOUNTER — Ambulatory Visit

## 2023-08-17 NOTE — Telephone Encounter (Signed)
 Mailbox full & adm notation said no to ok to leave a message on f/u call-

## 2023-08-17 NOTE — Progress Notes (Deleted)
    SUBJECTIVE:   CHIEF COMPLAINT / HPI:   Hypothyroidism TSH 8.12 at prior visit.  Currently taking levothyroxine  150 mcg daily.  HLD LDL 167 at prior visit.  Currently taking pravastatin  40 mg daily.  PERTINENT  PMH / PSH: ***  OBJECTIVE:   There were no vitals taken for this visit. ***  General: NAD, pleasant, able to participate in exam Cardiac: RRR, no murmurs. Respiratory: CTAB, normal effort, No wheezes, rales or rhonchi Abdomen: Bowel sounds present, nontender, nondistended Extremities: no edema or cyanosis. Skin: warm and dry, no rashes noted Neuro: alert, no obvious focal deficits Psych: Normal affect and mood  ASSESSMENT/PLAN:   No problem-specific Assessment & Plan notes found for this encounter.     Dr. Jonne Netters, DO [facility]    {    This will disappear when note is signed, click to select method of visit    :1}

## 2023-08-18 ENCOUNTER — Ambulatory Visit: Payer: Self-pay | Admitting: Family Medicine

## 2023-08-18 DIAGNOSIS — F410 Panic disorder [episodic paroxysmal anxiety] without agoraphobia: Secondary | ICD-10-CM

## 2023-08-18 DIAGNOSIS — E785 Hyperlipidemia, unspecified: Secondary | ICD-10-CM

## 2023-08-18 DIAGNOSIS — E890 Postprocedural hypothyroidism: Secondary | ICD-10-CM

## 2023-08-18 MED ORDER — LEVOTHYROXINE SODIUM 175 MCG PO TABS: 175.0000 ug | ORAL_TABLET | Freq: Every day | ORAL | 0 refills | Status: DC

## 2023-08-18 MED ORDER — DIAZEPAM 10 MG PO TABS: 10.0000 mg | ORAL_TABLET | Freq: Two times a day (BID) | ORAL | 0 refills | Status: DC | PRN

## 2023-08-18 MED ORDER — PRAVASTATIN SODIUM 80 MG PO TABS
80.0000 mg | ORAL_TABLET | Freq: Every day | ORAL | 1 refills | Status: AC
Start: 2023-08-18 — End: ?

## 2023-08-18 NOTE — Telephone Encounter (Cosign Needed)
 Spoke with patient regarding lab results from recent office visit.  Patient shared unfortunately her brother passed away yesterday and she took her aunt to the the hospital today.  Reports all the stress has triggered worsening panic attacks and she is having significant difficulty.  LDL 167, already on pravastatin  40 mg daily therefore suboptimally treated.  Will increase to pravastatin  80 mg daily.  Repeat direct LDL in 6 months  TSH 8.1, on levothyroxine  150 mcg daily.  Will increase to levothyroxine  175 mcg daily and repeat TSH in 4 to 6 weeks.  Given significant panic attacks and distress from recent life events, patient requesting small amount of diazepam to help control symptoms over the weekend. PDMP reviewed, will provided 5 tablets but explicitly stated will not be able to refill medication. Advised not to drive when using it.  Jonne Netters, DO

## 2023-08-31 ENCOUNTER — Telehealth: Payer: Self-pay

## 2023-08-31 ENCOUNTER — Ambulatory Visit: Payer: Self-pay | Admitting: Internal Medicine

## 2023-08-31 NOTE — Telephone Encounter (Signed)
-----   Message from [facility] Reena S sent at 08/30/2023  4:48 PM EDT ----- Ok to schedule US  at [facility].  Margit Dimes, CMA

## 2023-08-31 NOTE — Telephone Encounter (Signed)
 Called centralized scheduling and spoke to [place] to schedule patient a pelvic us . Was able to schedule the appointment for 09/02/2023 at 3pm. Check in time would be at 2:30pm. Location would be at [facility] and patient must go through Entrance A. Patient must drink 32oz of water prior to appointment and must have full bladder.  Called patient and informed her about upcoming appointment. Provided patient with the information above. Patient is aware.  Harlene Reiter, CMA

## 2023-09-01 ENCOUNTER — Encounter: Payer: Self-pay | Admitting: Family Medicine

## 2023-09-02 ENCOUNTER — Ambulatory Visit (HOSPITAL_COMMUNITY): Admission: RE | Admit: 2023-09-02 | Source: Ambulatory Visit

## 2023-09-12 ENCOUNTER — Telehealth: Payer: Self-pay

## 2023-09-12 ENCOUNTER — Other Ambulatory Visit (HOSPITAL_COMMUNITY): Payer: Self-pay

## 2023-09-12 NOTE — Telephone Encounter (Signed)
 Pharmacy Patient Advocate Encounter   Received notification from CoverMyMeds that prior authorization for QUETIAPINE   is required/requested.MG   Insurance verification completed.   The patient is insured through [facility] .   PA required; PA submitted to above mentioned insurance via CoverMyMeds Key/confirmation #/EOC [id number]. Status is pending

## 2023-09-12 NOTE — Telephone Encounter (Signed)
 Pharmacy Patient Advocate Encounter  Received notification from [facility] that Prior Authorization for QUETIAPINE  25MG  has been APPROVED from 09/12/23 to 09/11/24   PA #/Case ID/Reference #: [id number]

## 2023-09-13 ENCOUNTER — Other Ambulatory Visit: Payer: Self-pay | Admitting: Family Medicine

## 2023-09-13 DIAGNOSIS — Z716 Tobacco abuse counseling: Secondary | ICD-10-CM

## 2023-09-16 ENCOUNTER — Ambulatory Visit (HOSPITAL_COMMUNITY)

## 2023-09-19 ENCOUNTER — Other Ambulatory Visit: Payer: Self-pay | Admitting: Family Medicine

## 2023-09-19 DIAGNOSIS — L309 Dermatitis, unspecified: Secondary | ICD-10-CM

## 2023-09-19 DIAGNOSIS — M5020 Other cervical disc displacement, unspecified cervical region: Secondary | ICD-10-CM

## 2023-09-19 DIAGNOSIS — M792 Neuralgia and neuritis, unspecified: Secondary | ICD-10-CM

## 2023-09-22 ENCOUNTER — Encounter: Payer: Self-pay | Admitting: Family Medicine

## 2023-09-22 ENCOUNTER — Ambulatory Visit
Admission: RE | Admit: 2023-09-22 | Discharge: 2023-09-22 | Disposition: A | Discharge status: Home / Self Care | Source: Ambulatory Visit | Attending: Family Medicine | Admitting: Family Medicine

## 2023-09-22 ENCOUNTER — Encounter: Admitting: Physical Medicine & Rehabilitation

## 2023-09-22 ENCOUNTER — Ambulatory Visit (INDEPENDENT_AMBULATORY_CARE_PROVIDER_SITE_OTHER): Admitting: Family Medicine

## 2023-09-22 VITALS — BP 128/84 | Ht 68.0 in | Wt 230.0 lb

## 2023-09-22 DIAGNOSIS — M79671 Pain in right foot: Secondary | ICD-10-CM

## 2023-09-22 DIAGNOSIS — M25511 Pain in right shoulder: Secondary | ICD-10-CM

## 2023-09-22 DIAGNOSIS — M25512 Pain in left shoulder: Secondary | ICD-10-CM

## 2023-09-22 DIAGNOSIS — G8929 Other chronic pain: Secondary | ICD-10-CM | POA: Diagnosis not present

## 2023-09-22 DIAGNOSIS — M7731 Calcaneal spur, right foot: Secondary | ICD-10-CM | POA: Diagnosis not present

## 2023-09-22 MED ORDER — PREDNISONE 10 MG PO TABS: ORAL_TABLET | ORAL | 0 refills | Status: DC

## 2023-09-22 NOTE — Progress Notes (Signed)
 DATE OF VISIT: 09/22/2023        [name] DOB: 1965-03-18 MRN: [id number]  CC:  f/u Rt shoulder, new Lt shoulder pain, Rt heel  History of present Illness: [name] is a 58 y.o. female who presents for a follow-up visit   RE: Rt shoulder Last seen by me 01/06/23 - s/p fall 12/18/22 Feels like getting worse Having weakness and decreased ROM Used a sling for some time after last visit Worse with movement Trouble lifting items Tried pain patch without improvement Avoids NSAIDs due to gastritis Not taking Tylenol  Using occasional Valium  prn without improvement No imaging since 12/2022  RE: Lt shoulder Lt shoulder pain x 10-months No injury/trauma Left shoulder more due to her right shoulder pain No improvement with pain patches Has been using occasional Valium  as noted above without improvement No prior imaging of her left shoulder  RE: Rt foot/heel pain Patient reports acute on chronic right foot/heel pain She notes a prior history of lymphedema, as well as heel spur which she received an injection She notes she has been having increasing swelling No new injury or trauma Tries to wear comfortable footwear   Medications:  Outpatient Encounter Medications as of 09/22/2023  Medication Sig   predniSONE  (DELTASONE ) 10 MG tablet Use as directed per doctors orders for the next 6 days.   buPROPion  (WELLBUTRIN  XL) 300 MG 24 hr tablet TAKE 1 TABLET(300 MG) BY MOUTH DAILY   clobetasol  ointment (TEMOVATE ) 0.05 % APPLY TOPICALLY TO THE AFFECTED AREA DAILY AS NEEDED FOR ECZEMA OR FLARE   cromolyn (OPTICROM) 4 % ophthalmic solution Place 1 drop into both eyes daily.   dexlansoprazole  (DEXILANT ) 60 MG capsule TAKE 1 CAPSULE(60 MG) BY MOUTH DAILY   diazepam  (VALIUM ) 10 MG tablet Take 1 tablet (10 mg total) by mouth every 12 (twelve) hours as needed for up to 5 doses for anxiety.   Elastic Bandages & Supports (MEDICAL COMPRESSION STOCKINGS) MISC 2 Units by Does not apply route  daily.   hydrOXYzine  (ATARAX ) 50 MG tablet Take 1 tablet (50 mg total) by mouth 2 (two) times daily as needed for anxiety.   levothyroxine  (SYNTHROID ) 175 MCG tablet Take 1 tablet (175 mcg total) by mouth daily before breakfast.   linaclotide  (LINZESS ) 145 MCG CAPS capsule Take 1 capsule (145 mcg total) by mouth daily before breakfast.   mirabegron  ER (MYRBETRIQ ) 25 MG TB24 tablet Take 25 mg by mouth.   ondansetron  (ZOFRAN -ODT) 4 MG disintegrating tablet Take 1 tablet (4 mg total) by mouth daily as needed for nausea or vomiting. (Patient not taking: Reported on 08/16/2023)   pravastatin  (PRAVACHOL ) 80 MG tablet Take 1 tablet (80 mg total) by mouth daily.   pregabalin  (LYRICA ) 225 MG capsule TAKE 1 CAPSULE(225 MG) BY MOUTH TWICE DAILY   QUEtiapine  (SEROQUEL ) 25 MG tablet TAKE 2 TABLETS(50 MG) BY MOUTH AT BEDTIME   SPIRIVA  HANDIHALER 18 MCG inhalation capsule PLACE 1 CAPSULE INTO THE INHALER AND INHALE DAILY AS NEEDED   venlafaxine  XR (EFFEXOR -XR) 37.5 MG 24 hr capsule Take 1 capsule (37.5 mg total) by mouth daily with breakfast. (Patient not taking: Reported on 08/16/2023)   VENTOLIN  HFA 108 (90 Base) MCG/ACT inhaler INHALE 2 PUFFS INTO THE LUNGS EVERY 4 HOURS AS NEEDED FOR SHORTNESS OF BREATH. APPOINMENT NEEDED FOR ADDITIONAL REFILLS (Patient not taking: Reported on 08/16/2023)   XIIDRA 5 % SOLN Apply 1 drop to eye 2 (two) times daily.   No facility-administered encounter medications on file as of 09/22/2023.  Allergies: is allergic to vicodin [hydrocodone -acetaminophen ].  Physical Examination: Vitals: BP 128/84   Ht 5' 8 (1.727 m)   Wt 230 lb (104.3 kg)   BMI 34.97 kg/m  GENERAL:  LUE SYKORA is a 58 y.o. female appearing their stated age, alert and oriented x 3, in no apparent distress.  SKIN: no rashes or lesions, skin clean, dry, intact MSK: Shoulders: Right shoulder without gross deformity.  Diffusely tender throughout the anterior lateral and posterior shoulder.  No increased  redness or warmth.  Significantly decreased active range of motion in all planes, limited by 50% with pain.  Near full passive range of motion in all planes with associated pain.  Positive empty can, positive drop arm, positive Hawkins, positive Neer, rotator cuff strength 3/5 with abduction, 4-/5 with external rotation, internal rotation and forward flexion, or with pain. Left shoulder without gross deformity.  Near full range of motion with positive pain.  No tenderness over the bicipital groove or lateral shoulder.  Positive empty can, mildly positive Hawkins, mildly positive Neer, negative drop arm.  Rotator 5 -/5 throughout. Normal grip strength bilaterally  Foot: Right foot with 2+ pitting edema, no increased redness or warmth.  No skin breakdown.  Tender to palpation along the distal Achilles near the insertion of the calcaneus.  Also tender to palpation along the medial calcaneus along the origin of the plantar fascia.  Full range of motion of the ankle without pain. Walking without a limp  NEURO: sensation intact to light touch, DTR 2/4 bicep, tricep, brachial radialis bilaterally VASC: pulses 2+ and symmetric radial bilaterally, no edema  Radiology: Right shoulder x-ray from 12/18/2022 at urgent care p showing: No acute bony abnormality, no signs of fracture   LIMITED RT SHOULDER MSK U/S 01/06/23 showing: -Bicep: Hypoechoic change, small amount of fluid around the bicep tendon.  No signs of tearing -Pectoralis: Normal pectoralis insertion on the proximal humerus.  No signs of tearing.  Normal-appearing pectoralis muscle and the chest wall.  No visible defects or abnormalities -No significant increased fluid in the subacromial space -Clavicle: She has degenerative spurring at the [facility] joint, small amount of increased fluid over the distal aspect of the clavicle at the [facility] joint, but no signs of fracture or other abnormality   Impression: -AC joint osteoarthritis with some increased fluid  along the distal clavicle consistent with contusion -Biceps tendinopathy -No other abnormalities  Assessment & Plan Chronic right shoulder pain Chronic right shoulder pain status post fall 12/18/2022 with acute worsening, now with significant decreased active range of motion, associated shoulder weakness, concern for rotator cuff tear - No improvement with rest, home exercises, pain patches - Negative prior x-ray and relatively unremarkable MSK ultrasound  Plan: - Given acute worsening of her symptoms, now with significant range of motion deficits and weakness we will proceed with MRI of the right shoulder to rule out rotator cuff tear.  She would be interested in surgical intervention if needed - Was given a prescription for 6-day prednisone  taper to help with pain and inflammation - Will follow-up with me 1 week after MRI to review results Acute pain of left shoulder Acute left shoulder pain over the last 6 months, likely compensatory pain from her right shoulder issues.  No recent injury or trauma  Plan: - Imaging: Will obtain left shoulder x-ray to rule out bony abnormalities - Given a prescription for 6-day prednisone  taper to help with pain and inflammation - Heat or ice as needed - Consider possible  physical therapy versus injection pending imaging and MRI results of her right shoulder Pain of right heel Acute on chronic right heel pain, reports prior history of heel spur and cortisone injection.  Acutely worsening with exacerbation over the last several weeks/months, no injury or trauma  Plan: - Imaging: Will obtain right foot x-ray to assess for any bony abnormalities or heel spur - Patient inquiring about cortisone injection, will defer at this time.  Was given prescription for 6-day prednisone  taper to take orally to help with pain and inflammation - Follow-up pending x-rays to discuss further treatment - Should wear supportive footwear in the interim   Patient expressed  understanding & agreement with above.  Encounter Diagnoses  Name Primary?   Chronic right shoulder pain Yes   Acute pain of left shoulder    Pain of right heel     Orders Placed This Encounter  Procedures   DG Shoulder Left   DG Foot Complete Right   MR SHOULDER RIGHT WO CONTRAST

## 2023-09-22 NOTE — Patient Instructions (Addendum)
 You have ongoing right shoulder pain since your fall 12/18/22.  With your ongoing pain I am concerned you may have a tear in your rotator cuff - I placed an order for an MRI of your right shoulder to further evaluate and determine the cause of your shoulder pain - based on the MRI, we will be able to determine if you are a candidate for a cortisone injection  Your left shoulder pain is likely due to compensation related to your right shoulder pain - I placed an order for an xray to see if you have any significant arthritis or other bony abnormalities in your shoulder  For your right foot/heel pain, I placed an order for an xray to further assess your heel spur and to see if there are other abnormalities.  I sent a prescription for a 6-day Prednisone  (steroid pack) to your pharmacy to help with the pain in both shoulders and your foot.  You should follow-up with me 1 week after completing your MRI to review the results and discuss next steps.

## 2023-09-23 ENCOUNTER — Ambulatory Visit

## 2023-09-28 ENCOUNTER — Encounter: Payer: Self-pay | Admitting: Family Medicine

## 2023-09-29 ENCOUNTER — Ambulatory Visit (HOSPITAL_COMMUNITY)

## 2023-09-30 ENCOUNTER — Encounter: Payer: Self-pay | Admitting: Family Medicine

## 2023-09-30 DIAGNOSIS — L309 Dermatitis, unspecified: Secondary | ICD-10-CM

## 2023-10-03 ENCOUNTER — Ambulatory Visit: Payer: Self-pay | Admitting: Family Medicine

## 2023-10-12 ENCOUNTER — Ambulatory Visit (HOSPITAL_COMMUNITY)
Admission: RE | Admit: 2023-10-12 | Discharge: 2023-10-12 | Disposition: A | Discharge status: Home / Self Care | Source: Ambulatory Visit | Attending: Family Medicine | Admitting: Family Medicine

## 2023-10-12 DIAGNOSIS — D259 Leiomyoma of uterus, unspecified: Secondary | ICD-10-CM | POA: Diagnosis not present

## 2023-10-12 DIAGNOSIS — R102 Pelvic and perineal pain: Secondary | ICD-10-CM | POA: Diagnosis not present

## 2023-10-12 DIAGNOSIS — G8929 Other chronic pain: Secondary | ICD-10-CM | POA: Insufficient documentation

## 2023-10-13 ENCOUNTER — Telehealth: Payer: Self-pay | Admitting: Pharmacist

## 2023-10-13 DIAGNOSIS — Z87891 Personal history of nicotine dependence: Secondary | ICD-10-CM

## 2023-10-13 NOTE — Telephone Encounter (Signed)
-----   Message from [name] sent at 04/14/2023  3:13 PM EST ----- Regarding: Quit 12/2022 Not smoking for 3-4 months in Feb 2025

## 2023-10-13 NOTE — Assessment & Plan Note (Signed)
 Quit X 9 months

## 2023-10-13 NOTE — Telephone Encounter (Signed)
 Patient contacted for follow/up of tobacco cessation - quit X 9 months (quit date 12/2022)  Since last contact patient reports she had a single cigarette when her brother (actually cousin) died.   She is confident she can remain quit from tobacco long-term.   Motivation to quit: Breathing / Health.   Total time with patient call and documentation of interaction: 9 minutes. Follow-up phone call planned: 1 year (12/2023)

## 2023-10-14 NOTE — Telephone Encounter (Signed)
 Reviewed and agree with Dr Macky Lower plan.

## 2023-10-16 ENCOUNTER — Ambulatory Visit
Admission: RE | Admit: 2023-10-16 | Discharge: 2023-10-16 | Disposition: A | Discharge status: Home / Self Care | Source: Ambulatory Visit | Attending: Family Medicine | Admitting: Family Medicine

## 2023-10-16 DIAGNOSIS — G8929 Other chronic pain: Secondary | ICD-10-CM

## 2023-10-17 ENCOUNTER — Other Ambulatory Visit: Payer: Self-pay

## 2023-10-17 DIAGNOSIS — G8929 Other chronic pain: Secondary | ICD-10-CM

## 2023-10-17 NOTE — Progress Notes (Signed)
 Results reviewed, responded with results in separate MyChart message that patient sent.  Has appt with me later this week 10/20/23.

## 2023-10-18 ENCOUNTER — Ambulatory Visit

## 2023-10-18 ENCOUNTER — Other Ambulatory Visit: Payer: Self-pay

## 2023-10-18 DIAGNOSIS — I890 Lymphedema, not elsewhere classified: Secondary | ICD-10-CM

## 2023-10-18 DIAGNOSIS — M79671 Pain in right foot: Secondary | ICD-10-CM

## 2023-10-19 ENCOUNTER — Telehealth: Payer: Self-pay

## 2023-10-19 NOTE — Telephone Encounter (Signed)
 Patient calls nurse line requesting an apt to see PCP.   She reports she has a hx of boils around my groin. She reports tenderness to the area. She reports one did bust and drained a malodorous odor. She reports she one left that has not drained and is very painful.  She denies any fevers or chills. No nausea or vomiting.   Patient scheduled for tomorrow with PCP.   ED precautions discussed with patient.

## 2023-10-20 ENCOUNTER — Encounter: Payer: Self-pay | Admitting: Family Medicine

## 2023-10-20 ENCOUNTER — Ambulatory Visit (INDEPENDENT_AMBULATORY_CARE_PROVIDER_SITE_OTHER): Admitting: Family Medicine

## 2023-10-20 ENCOUNTER — Other Ambulatory Visit (HOSPITAL_COMMUNITY)
Admission: RE | Admit: 2023-10-20 | Discharge: 2023-10-20 | Disposition: A | Discharge status: Home / Self Care | Source: Ambulatory Visit | Attending: Family Medicine | Admitting: Family Medicine

## 2023-10-20 VITALS — BP 122/74 | Ht 68.0 in | Wt 243.0 lb

## 2023-10-20 VITALS — BP 138/88 | HR 54 | Wt 243.4 lb

## 2023-10-20 DIAGNOSIS — Z124 Encounter for screening for malignant neoplasm of cervix: Secondary | ICD-10-CM

## 2023-10-20 DIAGNOSIS — M79672 Pain in left foot: Secondary | ICD-10-CM

## 2023-10-20 DIAGNOSIS — L739 Follicular disorder, unspecified: Secondary | ICD-10-CM

## 2023-10-20 DIAGNOSIS — M79671 Pain in right foot: Secondary | ICD-10-CM

## 2023-10-20 DIAGNOSIS — L309 Dermatitis, unspecified: Secondary | ICD-10-CM | POA: Diagnosis not present

## 2023-10-20 DIAGNOSIS — I890 Lymphedema, not elsewhere classified: Secondary | ICD-10-CM

## 2023-10-20 DIAGNOSIS — E890 Postprocedural hypothyroidism: Secondary | ICD-10-CM

## 2023-10-20 MED ORDER — CLOBETASOL PROPIONATE 0.05 % EX OINT: TOPICAL_OINTMENT | CUTANEOUS | 2 refills | Status: AC

## 2023-10-20 MED ORDER — TRAMADOL HCL 50 MG PO TABS: 50.0000 mg | ORAL_TABLET | Freq: Two times a day (BID) | ORAL | 0 refills | Status: AC | PRN

## 2023-10-20 MED ORDER — MUPIROCIN 2 % EX OINT: 1.0000 | TOPICAL_OINTMENT | Freq: Two times a day (BID) | CUTANEOUS | 0 refills | Status: DC

## 2023-10-20 MED ORDER — CEPHALEXIN 500 MG PO CAPS: 500.0000 mg | ORAL_CAPSULE | Freq: Two times a day (BID) | ORAL | 0 refills | Status: AC

## 2023-10-20 NOTE — Assessment & Plan Note (Signed)
 Bilateral lower extremity lymphedema with increasing pain and swelling  Plan: - Recommend she discuss this further with her PCP and podiatry

## 2023-10-20 NOTE — Progress Notes (Signed)
    SUBJECTIVE:   CHIEF COMPLAINT / HPI:   Boils Recurrent issue, reports she tends to get these boils or areas of inflammation/infection in her vulvar region.  She states she had this before that one area burst open and drained fluid.  Thinks she has another that is enlarging.  Denies systemic symptoms such as fever.  States she is putting topical steroid cream which does help with the pain and irritation.  Bilateral heel pain Chronic, states she thinks it is worse recently which is flaring her lymphedema.  Reports she has been using ice and home exercises.  Used to be seen by podiatry, states they gave her Percocet in the hospital for pain.  Has also had a prior injection, requesting an injection today.  States she is planning to go to the water park with her kids this weekend and would like to be able to walk around.   PERTINENT  PMH / PSH: Hypothyroidism, GERD, Anxiety, Depression, postmenopausal, chronic lymphedema  OBJECTIVE:   BP 138/88   Pulse (!) 54   Wt 243 lb 6.4 oz (110.4 kg)   SpO2 96%   BMI 37.01 kg/m    General: NAD, pleasant, able to participate in exam MSK: Right heel -No erythema, ecchymosis or deformity -TTP over right plantar fascia extending up heel to lower Achilles tendon -Full ROM without pain -Chronic swelling/lymphedema changes to bilateral lower extremities, wearing compression socks upon exam Pelvic exam: normal external genitalia, vulva, vagina, cervix, uterus and adnexa, VULVA: Multiple firm, nodules with pore around labia majora, exam chaperoned by Dayshia, CMA  ASSESSMENT/PLAN:   Assessment & Plan Folliculitis Small areas of inflammation in vulvar region noted on exam, one extending into introitus.  Will treat with oral antibiotics given may be difficult to apply topical to certain areas and provide topical for recurrent infection. -Keflex  x 5 days -Topical mupirocin  twice daily Postoperative hypothyroidism Prior TSH ~8 in 07/2023.  Currently on  levothyroxine  175 mcg daily. -Repeat TSH, adjust medication as indicated Eczema, unspecified type Refill clobetasol  at patient request Pain of both heels Chronic, reporting worsening pain despite conservative therapies.  Requesting injection today, informed patient I do not do this in the office but she can follow-up with sports med at her appointment later today if desired to discuss further.  Appointment with podiatry next week for continued care. -Recommend supportive care with home exercises, icing, Tylenol  and topical Voltaren  gel and lidocaine  patches Screening for cervical cancer Pap with STI testing     Dr. Izetta Nap, DO [facility]

## 2023-10-20 NOTE — Assessment & Plan Note (Signed)
 Prior TSH ~8 in 07/2023.  Currently on levothyroxine  175 mcg daily. -Repeat TSH, adjust medication as indicated

## 2023-10-20 NOTE — Patient Instructions (Addendum)
 It was wonderful to see you today! Thank you for choosing [facility].   Please bring ALL of your medications with you to every visit.   Today we talked about:  We obtained your Pap smear today.  Routine STI testing recommended.  You will see those results on MyChart. I am also repeating your thyroid  levels today as they were slightly off last time.  We may need to adjust your medication. You can apply the topical antibacterial mupirocin  twice daily to affected areas when they come up.  Please also keep oral antibiotic twice per day for the next 5 days up with your symptoms. For your heel pain I do see this has been an ongoing issue for you.  Please follow-up with podiatry as you previously did to discuss other options.  I do recommend getting over-the-counter inserts to put in your shoes and wearing supportive shoes as this can help.  I also recommend rolling ice on your foot and doing the ABCs with your toe before getting out of bed.  You can also take Tylenol  up to 1000 mg 4 times per day.  You can also use these topical lidocaine  patches for your feet.  I do not do injections in the heel so I would recommend follow-up with sports medicine if you would like to discuss this further.  Please follow up in 3 months    We are checking some labs today. If they are abnormal, I will call you. If they are normal, I will send you a MyChart message (if it is active) or a letter in the mail. If you do not hear about your labs in the next 2 weeks, please call the office.  Call the clinic at [phone] if your symptoms worsen or you have any concerns.  Please be sure to schedule follow up at the front desk before you leave today.   Izetta Nap, DO Family Medicine

## 2023-10-20 NOTE — Progress Notes (Signed)
 DATE OF VISIT: 10/20/2023        [name] DOB: 1966/02/19 MRN: [id number]  CC:  f/u heel pain  History of present Illness: [name] is a 58 y.o. female who presents for a follow-up visit for bilateral heel pain Last seen by me 09/22/23 Xrays after visit showing heel spurs Has chronic heel pain with associated Lymphedema Previously seen by Podiatry - has appt with them next week 10/27/23 with Asberry Failing, DPM Today she reports that lymphedema symptoms are worsening Having increased pain and swelling Inquiring about potential heel injections to help with pain since her grandchildren are in town She continues to take her Lyrica  as prescribed, but has not been helpful No improvement with Tylenol  Having increasing pain with walking  Regarding her shoulder, she is scheduled to see orthopedic surgeon in the future for this.  Still having pain  Medications:  Outpatient Encounter Medications as of 10/20/2023  Medication Sig   traMADol  (ULTRAM ) 50 MG tablet Take 1 tablet (50 mg total) by mouth every 12 (twelve) hours as needed for up to 5 days for severe pain (pain score 7-10).   buPROPion  (WELLBUTRIN  XL) 300 MG 24 hr tablet TAKE 1 TABLET(300 MG) BY MOUTH DAILY   cephALEXin  (KEFLEX ) 500 MG capsule Take 1 capsule (500 mg total) by mouth 2 (two) times daily for 5 days.   clobetasol  ointment (TEMOVATE ) 0.05 % APPLY TOPICALLY TO THE AFFECTED AREA DAILY AS NEEDED FOR ECZEMA OR FLARE   cromolyn (OPTICROM) 4 % ophthalmic solution Place 1 drop into both eyes daily.   dexlansoprazole  (DEXILANT ) 60 MG capsule TAKE 1 CAPSULE(60 MG) BY MOUTH DAILY   Elastic Bandages & Supports (MEDICAL COMPRESSION STOCKINGS) MISC 2 Units by Does not apply route daily.   hydrOXYzine  (ATARAX ) 50 MG tablet Take 1 tablet (50 mg total) by mouth 2 (two) times daily as needed for anxiety.   levothyroxine  (SYNTHROID ) 175 MCG tablet Take 1 tablet (175 mcg total) by mouth daily before breakfast.   linaclotide  (LINZESS )  145 MCG CAPS capsule Take 1 capsule (145 mcg total) by mouth daily before breakfast.   mirabegron  ER (MYRBETRIQ ) 25 MG TB24 tablet Take 25 mg by mouth.   mupirocin  ointment (BACTROBAN ) 2 % Apply 1 Application topically 2 (two) times daily.   ondansetron  (ZOFRAN -ODT) 4 MG disintegrating tablet Take 1 tablet (4 mg total) by mouth daily as needed for nausea or vomiting. (Patient not taking: Reported on 08/16/2023)   pravastatin  (PRAVACHOL ) 80 MG tablet Take 1 tablet (80 mg total) by mouth daily.   pregabalin  (LYRICA ) 225 MG capsule TAKE 1 CAPSULE(225 MG) BY MOUTH TWICE DAILY   QUEtiapine  (SEROQUEL ) 25 MG tablet TAKE 2 TABLETS(50 MG) BY MOUTH AT BEDTIME   SPIRIVA  HANDIHALER 18 MCG inhalation capsule PLACE 1 CAPSULE INTO THE INHALER AND INHALE DAILY AS NEEDED   venlafaxine  XR (EFFEXOR -XR) 37.5 MG 24 hr capsule Take 1 capsule (37.5 mg total) by mouth daily with breakfast. (Patient not taking: Reported on 08/16/2023)   VENTOLIN  HFA 108 (90 Base) MCG/ACT inhaler INHALE 2 PUFFS INTO THE LUNGS EVERY 4 HOURS AS NEEDED FOR SHORTNESS OF BREATH. APPOINMENT NEEDED FOR ADDITIONAL REFILLS (Patient not taking: Reported on 08/16/2023)   XIIDRA 5 % SOLN Apply 1 drop to eye 2 (two) times daily.   [DISCONTINUED] clobetasol  ointment (TEMOVATE ) 0.05 % APPLY TOPICALLY TO THE AFFECTED AREA DAILY AS NEEDED FOR ECZEMA OR FLARE   [DISCONTINUED] diazepam  (VALIUM ) 10 MG tablet Take 1 tablet (10 mg total) by mouth every  12 (twelve) hours as needed for up to 5 doses for anxiety.   [DISCONTINUED] predniSONE  (DELTASONE ) 10 MG tablet Use as directed per doctors orders for the next 6 days.   No facility-administered encounter medications on file as of 10/20/2023.    Allergies: is allergic to vicodin [hydrocodone -acetaminophen ].  Physical Examination: Vitals: BP 122/74   Ht 5' 8 (1.727 m)   Wt 243 lb (110.2 kg)   BMI 36.95 kg/m  GENERAL:  [name] is a 58 y.o. female appearing their stated age, alert and oriented x 3, in no  apparent distress.  MSK: Diffuse lower extremity swelling bilaterally, no increased redness or warmth.  Has tenderness to palpation with light palpation throughout the lower leg, ankle, foot.  Does not have any distinct bony tenderness throughout the foot or the ankle.  Walking without a limp.  Ankles with full range of motion bilaterally NEURO: sensation intact to light touch VASC: 2+ edema lower extremities bilaterally  Radiology: XRAY: Right foot x-ray 7/70/25 showing: IMPRESSION: 1. Small to moderate plantar calcaneal spur. 2. Slight hammertoe deformity of the fifth toe. Assessment & Plan Heel pain, bilateral Acute on chronic bilateral heel pain complicated by underlying lymphedema, also has small heel spur on x-ray - Patient reports previous injection in the past which was helpful  Plan: - X-ray findings reviewed with patient.  Indicated that she does have a small to moderate heel spur, but her description of her symptoms with the pain and swelling related to her lymphedema does not correlate with pain related to the heel spur.  Lymphedema may be exacerbating heel pain - Advised patient that I do not recommend a cortisone injection given her diffuse pain throughout the foot and ankle.  She should keep her follow-up with the podiatrist as scheduled.  She can certainly call their office to see if there are any sooner cancellations - Due to her acute pain, did give her a limited supply of tramadol  50 mg 1 tab p.o. twice daily as needed severe pain, dispense 10 tabs, no refills.  Advised may cause drowsiness.  Advised to use sparingly Lymphedema Bilateral lower extremity lymphedema with increasing pain and swelling  Plan: - Recommend she discuss this further with her PCP and podiatry   Patient expressed understanding & agreement with above.  Encounter Diagnoses  Name Primary?   Heel pain, bilateral Yes   Lymphedema     No orders of the defined types were placed in this  encounter.

## 2023-10-22 LAB — RPR: RPR Ser Ql: REACTIVE — AB

## 2023-10-22 LAB — RPR, QUANT+TP ABS (REFLEX)
Rapid Plasma Reagin, Quant: 1:2 {titer} — ABNORMAL HIGH
T Pallidum Abs: REACTIVE — AB

## 2023-10-22 LAB — HIV ANTIBODY (ROUTINE TESTING W REFLEX): HIV Screen 4th Generation wRfx: NONREACTIVE

## 2023-10-22 LAB — TSH: TSH: 4.01 u[IU]/mL (ref 0.450–4.500)

## 2023-10-22 LAB — HEPATITIS C ANTIBODY: Hep C Virus Ab: NONREACTIVE

## 2023-10-24 ENCOUNTER — Ambulatory Visit: Payer: Self-pay | Admitting: Family Medicine

## 2023-10-24 LAB — CYTOLOGY - PAP
Chlamydia: NEGATIVE
Comment: NEGATIVE
Comment: NEGATIVE
Comment: NEGATIVE
Comment: NORMAL
Diagnosis: NEGATIVE
Diagnosis: REACTIVE
High risk HPV: NEGATIVE
Neisseria Gonorrhea: NEGATIVE
Trichomonas: NEGATIVE

## 2023-10-24 MED ORDER — CLOBETASOL PROPIONATE 0.05 % EX CREA: 1.0000 | TOPICAL_CREAM | Freq: Two times a day (BID) | CUTANEOUS | 2 refills | Status: AC

## 2023-10-26 ENCOUNTER — Ambulatory Visit (INDEPENDENT_AMBULATORY_CARE_PROVIDER_SITE_OTHER)

## 2023-10-26 ENCOUNTER — Encounter: Payer: Self-pay | Admitting: Podiatry

## 2023-10-26 ENCOUNTER — Ambulatory Visit: Admitting: Podiatry

## 2023-10-26 DIAGNOSIS — M722 Plantar fascial fibromatosis: Secondary | ICD-10-CM

## 2023-10-26 DIAGNOSIS — I890 Lymphedema, not elsewhere classified: Secondary | ICD-10-CM

## 2023-10-26 MED ORDER — TRIAMCINOLONE ACETONIDE 10 MG/ML IJ SUSP
2.5000 mg | Freq: Once | INTRAMUSCULAR | Status: AC
  Administered 2023-10-26: 2.5 mg via INTRA_ARTICULAR

## 2023-10-26 MED ORDER — DEXAMETHASONE SODIUM PHOSPHATE 120 MG/30ML IJ SOLN
4.0000 mg | Freq: Once | INTRAMUSCULAR | Status: AC
  Administered 2023-10-26: 4 mg via INTRA_ARTICULAR

## 2023-10-26 NOTE — Progress Notes (Signed)
  Subjective:  Patient ID: [name], female    DOB: 19-Jul-1965,   MRN: [id number]  Chief Complaint  Patient presents with   Foot Pain    The bottom of my feet hurt and the pain radiates up my legs.  I have Lymphedema.    58 y.o. female presents for new concern of bilateral heel pains and lymphedema. Relates pain radiates up her leg to her hip. This has been ongoing for a long time. Relates first steps after rest are very painful. She is unable to take NSAIDS but has been taking tylenol . Has a history of lower back issues.     . Denies any other pedal complaints. Denies n/v/f/c.   Past Medical History:  Diagnosis Date   Allergy    Anxiety    Arthritis    knees,hands, hip  pt. Denies   Cellulitis    Colon polyps 2013   SESSIL SERRATED ADENOMA (ALL FRAGMENTS)   Complication of anesthesia    Lowered BP had to keep pt. overnight   COPD (chronic obstructive pulmonary disease) (HCC)    Depression    Fibroadenosis breast    GERD (gastroesophageal reflux disease)    does not take med   Heart murmur    as child  pt. denies   Hyperlipidemia    no meds - diet controlled   Hypothyroidism    Lymph edema    both legs and feet   Migraine    Plantar fasciitis    Syphilis Treated in early 2012 during hospitalization   Uterine fibroid    Varicose veins     Objective:  Physical Exam: Vascular: DP/PT pulses 2/4 bilateral. CFT <3 seconds. Absent hair growth noted. Lyphedema noted bilateral.  Skin. No lacerations or abrasions bilateral feet.  Musculoskeletal: MMT 5/5 bilateral lower extremities in DF, PF, Inversion and Eversion. Deceased ROM in DF of ankle joint. Tender to the medial calcaneal tubercle bilaterally . No pain with achilles, PT or arch. No pain with calcaneal squeeze.  Neurological: Sensation intact to light touch.   Assessment:   1. Plantar fasciitis, bilateral   2. Lymphedema      Plan:  Patient was evaluated and treated and all questions answered. Discussed  plantar fasciitis with patient.  X-rays reviewed and discussed with patient. No acute fractures or dislocations noted. Mild spurring noted at inferior calcaneus.  Discussed treatment options including, ice, NSAIDS, supportive shoes, bracing, and stretching. Stretching exercises provided to be done on a daily basis.   PF brace dispensed.  Will avoid oral medication for now.  Patient requesting injection today. Procedure note below.   Follow-up 6 weeks or sooner if any problems arise. In the meantime, encouraged to call the office with any questions, concerns, change in symptoms.   Procedure:  Discussed etiology, pathology, conservative vs. surgical therapies. At this time a plantar fascial injection was recommended.  The patient agreed and a sterile skin prep was applied.  An injection consisting of  1cc dexamethasone  0.5 cc kenalog  and 1cc marcaine  mixture was infiltrated at the point of maximal tenderness on the bilateral Heel.  Bandaid applied. The patient tolerated this well and was given instructions for aftercare.    Asberry Failing, DPM

## 2023-10-26 NOTE — Patient Instructions (Signed)

## 2023-10-31 ENCOUNTER — Other Ambulatory Visit: Payer: Self-pay | Admitting: Gastroenterology

## 2023-10-31 ENCOUNTER — Other Ambulatory Visit: Payer: Self-pay | Admitting: Family Medicine

## 2023-10-31 DIAGNOSIS — K219 Gastro-esophageal reflux disease without esophagitis: Secondary | ICD-10-CM

## 2023-11-11 ENCOUNTER — Encounter: Attending: Physical Medicine & Rehabilitation | Admitting: Physical Medicine & Rehabilitation

## 2023-11-11 ENCOUNTER — Other Ambulatory Visit: Payer: Self-pay | Admitting: Family Medicine

## 2023-11-11 DIAGNOSIS — L739 Follicular disorder, unspecified: Secondary | ICD-10-CM

## 2023-11-11 DIAGNOSIS — E785 Hyperlipidemia, unspecified: Secondary | ICD-10-CM

## 2023-11-11 NOTE — Progress Notes (Deleted)
 Subjective:    Patient ID: [name], female    DOB: September 20, 1965, 58 y.o.   MRN: [id number]  HPI   Pain Inventory Average Pain {NUMBERS; 0-10:5044} Pain Right Now {NUMBERS; 0-10:5044} My pain is {PAIN DESCRIPTION:21022940}  In the last 24 hours, has pain interfered with the following? General activity {NUMBERS; 0-10:5044} Relation with others {NUMBERS; 0-10:5044} Enjoyment of life {NUMBERS; 0-10:5044} What TIME of day is your pain at its worst? {time of day:24191} Sleep (in general) {BHH GOOD/FAIR/POOR:22877}  Pain is worse with: {ACTIVITIES:21022942} Pain improves with: {PAIN IMPROVES TPUY:78977056} Relief from Meds: {NUMBERS; 0-10:5044}  Family History  Problem Relation Age of Onset   Diabetes Father    Heart disease Father    Hyperlipidemia Father    Hypertension Father    Diabetes Mother    Hypertension Mother    Hyperlipidemia Mother    Diabetes Paternal Grandmother    Colon cancer Neg Hx    Esophageal cancer Neg Hx    Rectal cancer Neg Hx    Stomach cancer Neg Hx    Social History   Socioeconomic History   Marital status: Divorced    Spouse name: Not on file   Number of children: 2   Years of education: Not on file   Highest education level: Not on file  Occupational History   Occupation: disabled  Tobacco Use   Smoking status: Former    Current packs/day: 0.00    Average packs/day: 0.5 packs/day for 26.5 years (13.7 ttl pk-yrs)    Types: Cigarettes    Start date: 06/06/1996    Quit date: 12/07/2022    Years since quitting: 0.9   Smokeless tobacco: Never   Tobacco comments:    Quit Spring 2023 with use of bupropion .  Quit Fall 2024 - quit X 3-4 months 04/2023 phone call.   Vaping Use   Vaping status: Never Used  Substance and Sexual Activity   Alcohol use: Not Currently    Comment: rare   Drug use: Not Currently    Types: Marijuana    Comment: past hx 56yrs ago   Sexual activity: Yes    Birth control/protection: Surgical  Other Topics  Concern   Not on file  Social History Narrative   Lives with son in [place].    Social Drivers of Health   Financial Resource Strain: High Risk (05/09/2020)   Overall Financial Resource Strain (CARDIA)    Difficulty of Paying Living Expenses: Hard  Food Insecurity: No Food Insecurity (05/09/2020)   Hunger Vital Sign    Worried About Running Out of Food in the Last Year: Never true    Ran Out of Food in the Last Year: Never true  Transportation Needs: No Transportation Needs (05/09/2020)   PRAPARE - Administrator, Civil Service (Medical): No    Lack of Transportation (Non-Medical): No  Physical Activity: Not on file  Stress: Stress Concern Present (05/09/2020)   [employer] of Occupational Health - Occupational Stress Questionnaire    Feeling of Stress : Very much  Social Connections: Not on file   Past Surgical History:  Procedure Laterality Date   COLONOSCOPY  2013   cyst removed     behind left knee and left cheek   ENDOMETRIAL ABLATION     ESOPHAGOGASTRODUODENOSCOPY  2013   normal   EYE SURGERY     bilarteral   HYDRADENITIS EXCISION Right 09/13/2017   Procedure: EXCISION HIDRADENITIS GROIN;  Surgeon: Signe Mitzie LABOR, MD;  Location: [facility];  Service: General;  Laterality: Right;  LMA   INCISION AND DRAINAGE ABSCESS / HEMATOMA OF BURSA / KNEE / THIGH     right upper thigh - 2008   NORPLANT REMOVAL     right upper arm - 12/2001   svd     x 2   THYROID  SURGERY  2004   removed   TUBAL LIGATION     2003   WISDOM TOOTH EXTRACTION     Past Surgical History:  Procedure Laterality Date   COLONOSCOPY  2013   cyst removed     behind left knee and left cheek   ENDOMETRIAL ABLATION     ESOPHAGOGASTRODUODENOSCOPY  2013   normal   EYE SURGERY     bilarteral   HYDRADENITIS EXCISION Right 09/13/2017   Procedure: EXCISION HIDRADENITIS GROIN;  Surgeon: Signe Mitzie LABOR, MD;  Location: [facility];  Service: General;  Laterality: Right;  LMA   INCISION AND DRAINAGE  ABSCESS / HEMATOMA OF BURSA / KNEE / THIGH     right upper thigh - 2008   NORPLANT REMOVAL     right upper arm - 12/2001   svd     x 2   THYROID  SURGERY  2004   removed   TUBAL LIGATION     2003   WISDOM TOOTH EXTRACTION     Past Medical History:  Diagnosis Date   Allergy    Anxiety    Arthritis    knees,hands, hip  pt. Denies   Cellulitis    Colon polyps 2013   SESSIL SERRATED ADENOMA (ALL FRAGMENTS)   Complication of anesthesia    Lowered BP had to keep pt. overnight   COPD (chronic obstructive pulmonary disease) (HCC)    Depression    Fibroadenosis breast    GERD (gastroesophageal reflux disease)    does not take med   Heart murmur    as child  pt. denies   Hyperlipidemia    no meds - diet controlled   Hypothyroidism    Lymph edema    both legs and feet   Migraine    Plantar fasciitis    Syphilis Treated in early 2012 during hospitalization   Uterine fibroid    Varicose veins    There were no vitals taken for this visit.  Opioid Risk Score:   Fall Risk Score:  `1  Depression screen [facility] 2/9     10/20/2023    9:35 AM 08/05/2023   11:07 AM 11/04/2022   10:09 AM 09/22/2022   10:42 AM 09/13/2022   12:15 PM 06/11/2022   11:33 AM 01/12/2022    1:33 PM  Depression screen PHQ 2/9  Decreased Interest 2 2 2 3 1 3  0  Down, Depressed, Hopeless 3 3 3  3 3 3   PHQ - 2 Score 5 5 5 3 4 6 3   Altered sleeping 3 3 3 3 3 3 3   Tired, decreased energy 3 3 3 3 3  0 3  Change in appetite 3 3 2 3  0 0 3  Feeling bad or failure about yourself  1 1 1  0 0 0 0  Trouble concentrating 3 2 3 3 3 3 2   Moving slowly or fidgety/restless 2 3 0 0 3 0 0  Suicidal thoughts 0 0 0 0 0 0 0  PHQ-9 Score 20 20 17 15 16 12 14     Review of Systems     Objective:   Physical Exam  Assessment & Plan:

## 2023-11-18 DIAGNOSIS — M7541 Impingement syndrome of right shoulder: Secondary | ICD-10-CM | POA: Diagnosis not present

## 2023-11-18 DIAGNOSIS — M7542 Impingement syndrome of left shoulder: Secondary | ICD-10-CM | POA: Diagnosis not present

## 2023-12-09 ENCOUNTER — Other Ambulatory Visit: Payer: Self-pay | Admitting: Gastroenterology

## 2023-12-09 DIAGNOSIS — R112 Nausea with vomiting, unspecified: Secondary | ICD-10-CM

## 2023-12-12 ENCOUNTER — Ambulatory Visit (INDEPENDENT_AMBULATORY_CARE_PROVIDER_SITE_OTHER): Admitting: Podiatry

## 2023-12-12 DIAGNOSIS — Z91199 Patient's noncompliance with other medical treatment and regimen due to unspecified reason: Secondary | ICD-10-CM

## 2023-12-12 NOTE — Progress Notes (Signed)
 No show

## 2023-12-22 ENCOUNTER — Encounter: Admitting: Physical Medicine & Rehabilitation

## 2024-01-04 ENCOUNTER — Telehealth: Payer: Self-pay | Admitting: Pharmacist

## 2024-01-04 NOTE — Telephone Encounter (Signed)
 Patient contacted for follow/up of tobacco cessation  - quit 1 year f/u  Since last contact patient reports continued abstinence from smoking  Continues to rate CONFIDENCE of remaining quit from tobacco as high.  Reports minimal craving or thoughts of smoking.   Congratulated on success and encouraged continued abstinence.   Total time with patient call and documentation of interaction: 11 minutes.

## 2024-01-04 NOTE — Telephone Encounter (Signed)
 Reviewed and agree with Dr Rennis plan.

## 2024-01-06 ENCOUNTER — Ambulatory Visit (HOSPITAL_COMMUNITY): Admitting: Psychiatry

## 2024-01-09 ENCOUNTER — Other Ambulatory Visit: Payer: Self-pay | Admitting: Family Medicine

## 2024-01-09 DIAGNOSIS — L739 Follicular disorder, unspecified: Secondary | ICD-10-CM

## 2024-01-09 DIAGNOSIS — M549 Dorsalgia, unspecified: Secondary | ICD-10-CM

## 2024-01-09 DIAGNOSIS — Z716 Tobacco abuse counseling: Secondary | ICD-10-CM

## 2024-01-26 ENCOUNTER — Encounter: Admitting: Physical Medicine & Rehabilitation

## 2024-01-26 NOTE — Progress Notes (Deleted)
 Subjective:    Patient ID: ERI MCEVERS, female    DOB: 1966/02/08, 57 y.o.   MRN: [id number]  HPI Pain Inventory Average Pain {NUMBERS; 0-10:5044} Pain Right Now {NUMBERS; 0-10:5044} My pain is {PAIN DESCRIPTION:21022940}  In the last 24 hours, has pain interfered with the following? General activity {NUMBERS; 0-10:5044} Relation with others {NUMBERS; 0-10:5044} Enjoyment of life {NUMBERS; 0-10:5044} What TIME of day is your pain at its worst? {time of day:24191} Sleep (in general) {BHH GOOD/FAIR/POOR:22877}  Pain is worse with: {ACTIVITIES:21022942} Pain improves with: {PAIN IMPROVES TPUY:78977056} Relief from Meds: {NUMBERS; 0-10:5044}  {MOBILITY JIO:78977055}  {FUNCTION:21022946}  {NEURO/PSYCH:21022948}  {CPRM PRIOR STUDIES:21022953}  {CPRM PHYSICIANS INVOLVED IN YOUR CARE:21022954}    Family History  Problem Relation Age of Onset   Diabetes Father    Heart disease Father    Hyperlipidemia Father    Hypertension Father    Diabetes Mother    Hypertension Mother    Hyperlipidemia Mother    Diabetes Paternal Grandmother    Colon cancer Neg Hx    Esophageal cancer Neg Hx    Rectal cancer Neg Hx    Stomach cancer Neg Hx    Social History   Socioeconomic History   Marital status: Divorced    Spouse name: Not on file   Number of children: 2   Years of education: Not on file   Highest education level: Not on file  Occupational History   Occupation: disabled  Tobacco Use   Smoking status: Former    Current packs/day: 0.00    Average packs/day: 0.5 packs/day for 26.5 years (13.7 ttl pk-yrs)    Types: Cigarettes    Start date: 06/06/1996    Quit date: 12/07/2022    Years since quitting: 1.1   Smokeless tobacco: Never   Tobacco comments:    Quit Spring 2023 with use of bupropion .  Quit Fall 2024 - quit X 3-4 months 04/2023 phone call.   Vaping Use   Vaping status: Never Used  Substance and Sexual Activity   Alcohol use: Not Currently    Comment: rare    Drug use: Not Currently    Types: Marijuana    Comment: past hx 57yrs ago   Sexual activity: Yes    Birth control/protection: Surgical  Other Topics Concern   Not on file  Social History Narrative   Lives with son in [place].    Social Drivers of Health   Financial Resource Strain: High Risk (05/09/2020)   Overall Financial Resource Strain (CARDIA)    Difficulty of Paying Living Expenses: Hard  Food Insecurity: No Food Insecurity (05/09/2020)   Hunger Vital Sign    Worried About Running Out of Food in the Last Year: Never true    Ran Out of Food in the Last Year: Never true  Transportation Needs: No Transportation Needs (05/09/2020)   PRAPARE - Administrator, Civil Service (Medical): No    Lack of Transportation (Non-Medical): No  Physical Activity: Not on file  Stress: Stress Concern Present (05/09/2020)   [employer] of Occupational Health - Occupational Stress Questionnaire    Feeling of Stress : Very much  Social Connections: Not on file   Past Surgical History:  Procedure Laterality Date   COLONOSCOPY  2013   cyst removed     behind left knee and left cheek   ENDOMETRIAL ABLATION     ESOPHAGOGASTRODUODENOSCOPY  2013   normal   EYE SURGERY     bilarteral   HYDRADENITIS  EXCISION Right 09/13/2017   Procedure: EXCISION HIDRADENITIS GROIN;  Surgeon: Signe Mitzie LABOR, MD;  Location: [facility];  Service: General;  Laterality: Right;  LMA   INCISION AND DRAINAGE ABSCESS / HEMATOMA OF BURSA / KNEE / THIGH     right upper thigh - 2008   NORPLANT REMOVAL     right upper arm - 12/2001   svd     x 2   THYROID  SURGERY  2004   removed   TUBAL LIGATION     2003   WISDOM TOOTH EXTRACTION     Past Medical History:  Diagnosis Date   Allergy    Anxiety    Arthritis    knees,hands, hip  pt. Denies   Cellulitis    Colon polyps 2013   SESSIL SERRATED ADENOMA (ALL FRAGMENTS)   Complication of anesthesia    Lowered BP had to keep pt. overnight   COPD (chronic  obstructive pulmonary disease) (HCC)    Depression    Fibroadenosis breast    GERD (gastroesophageal reflux disease)    does not take med   Heart murmur    as child  pt. denies   Hyperlipidemia    no meds - diet controlled   Hypothyroidism    Lymph edema    both legs and feet   Migraine    Plantar fasciitis    Syphilis Treated in early 2012 during hospitalization   Uterine fibroid    Varicose veins    There were no vitals taken for this visit.  Opioid Risk Score:   Fall Risk Score:  `1  Depression screen [facility] 2/9     10/20/2023    9:35 AM 08/05/2023   11:07 AM 11/04/2022   10:09 AM 09/22/2022   10:42 AM 09/13/2022   12:15 PM 06/11/2022   11:33 AM 01/12/2022    1:33 PM  Depression screen PHQ 2/9  Decreased Interest 2 2 2 3 1 3  0  Down, Depressed, Hopeless 3 3 3  3 3 3   PHQ - 2 Score 5 5 5 3 4 6 3   Altered sleeping 3 3 3 3 3 3 3   Tired, decreased energy 3 3 3 3 3  0 3  Change in appetite 3 3 2 3  0 0 3  Feeling bad or failure about yourself  1 1 1  0 0 0 0  Trouble concentrating 3 2 3 3 3 3 2   Moving slowly or fidgety/restless 2 3 0 0 3 0 0  Suicidal thoughts 0 0 0 0 0 0 0  PHQ-9 Score 20  20  17  15  16  12  14       Data saved with a previous flowsheet row definition      Review of Systems     Objective:   Physical Exam        Assessment & Plan:

## 2024-01-27 ENCOUNTER — Ambulatory Visit (HOSPITAL_COMMUNITY): Admitting: Physician Assistant

## 2024-01-27 ENCOUNTER — Encounter (HOSPITAL_COMMUNITY): Payer: Self-pay

## 2024-01-31 ENCOUNTER — Other Ambulatory Visit: Payer: Self-pay | Admitting: Family Medicine

## 2024-02-13 ENCOUNTER — Ambulatory Visit (INDEPENDENT_AMBULATORY_CARE_PROVIDER_SITE_OTHER): Admitting: Clinical

## 2024-02-13 DIAGNOSIS — F331 Major depressive disorder, recurrent, moderate: Secondary | ICD-10-CM | POA: Diagnosis not present

## 2024-02-13 NOTE — Progress Notes (Unsigned)
 Comprehensive Clinical Assessment (CCA) Note  02/13/2024 [name] [id number]  Virtual Visit via Video Note  I connected with [name] on 02/13/2024 at 11:00 AM EST by a video enabled telemedicine application and verified that I am speaking with the correct person using two identifiers.  Location: Patient: home Provider: [email or web address] office   I discussed the limitations of evaluation and management by telemedicine and the availability of in person appointments. The patient expressed understanding and agreed to proceed.  Follow Up Instructions: I discussed the assessment and treatment plan with the patient. The patient was provided an opportunity to ask questions and all were answered. The patient agreed with the plan and demonstrated an understanding of the instructions.   The patient was advised to call back or seek an in-person evaluation if the symptoms worsen or if the condition fails to improve as anticipated.  I provided 25 minutes of non-face-to-face time during this encounter.   Lauren CINDERELLA Morin, LCSW   Chief Complaint:  Chief Complaint  Patient presents with   Depression   Anxiety   Panic Attack   Visit Diagnosis:   MDD, recurrent episode, moderate with anxious distress   Treatment recommendations: individual therapy and psychiatry via [facility]  Flowsheet Row Counselor from 02/13/2024 in [facility]  PHQ-9 Total Score 21       02/13/2024   11:25 AM  GAD 7 : Generalized Anxiety Score  Nervous, Anxious, on Edge 3  Control/stop worrying 3  Worry too much - different things 3  Trouble relaxing 3  Restless 3  Easily annoyed or irritable 2  Afraid - awful might happen 2  Total GAD 7 Score 19  Anxiety Difficulty Very difficult      CCA Biopsychosocial Intake/Chief Complaint:  client reported she is referred by her pcp with [facility]. client reported she has been treated for depression nd anxiety and panic  attacks. client reported her PCP methods were not working so she was referred to behavioral health. client reported she is prescribed hydroxyzine  which was not helpful. client reported she is also getting over menopause which has made her symptoms worse. client reported over she has had depression and axniety for 30 years. client reported it is hereditary in her family.  Current Symptoms/Problems: client reported the depression has gotten worse when her mom passed in 2016. client reported ever since then she has felt worse. client reported november is when her mom passed. client reported she also lost her father in 67 in december. client reported their passing was hard on her because she is the only child. client reported she doesnt go out unless she has a doctor appt. client reported no motivation, low mood. client reported the anxiety starts off if she is int he store she panics for no reason and then feels on edge and has to leave social settings. client reported no history of suicidal thoughts.  Patient Reported Schizophrenia/Schizoaffective Diagnosis in Past: No  Strengths: engage in treatment planning  Preferences: counseling and medication  Abilities: discuss hx of symptoms and needs  Type of Services Patient Feels are Needed: No data recorded  Initial Clinical Notes/Concerns: client reported no hx of hospitalization for mental health. client reported no hsitory of AVH. client reported she cries alot and stays to herself. client reported her imediate support is limited as her son and daugher and grandchildren live in [place], [place].   Mental Health Symptoms Depression:  Change in energy/activity; Tearfulness; Worthlessness  Duration of Depressive symptoms: Greater than two weeks   Mania:  None   Anxiety:   Tension; Worrying   Psychosis:  None   Duration of Psychotic symptoms: No data recorded  Trauma:  None   Obsessions:  None   Compulsions:  None   Inattention:  None    Hyperactivity/Impulsivity:  None   Oppositional/Defiant Behaviors:  None   Emotional Irregularity:  None   Other Mood/Personality Symptoms:  No data recorded   Mental Status Exam Appearance and self-care  Stature:  Average   Weight:  Average weight   Clothing:  Casual   Grooming:  Normal   Cosmetic use:  Age appropriate   Posture/gait:  Normal   Motor activity:  Not Remarkable   Sensorium  Attention:  Normal   Concentration:  Normal   Orientation:  X5   Recall/memory:  Normal   Affect and Mood  Affect:  Depressed   Mood:  Depressed   Relating  Eye contact:  Normal   Facial expression:  Depressed; Responsive   Attitude toward examiner:  Cooperative   Thought and Language  Speech flow: Clear and Coherent   Thought content:  Appropriate to Mood and Circumstances   Preoccupation:  None   Hallucinations:  None   Organization:  No data recorded  [employer] of Knowledge:  Good   Intelligence:  Average   Abstraction:  Normal   Judgement:  Good   Reality Testing:  Adequate   Insight:  Good   Decision Making:  Normal   Social Functioning  Social Maturity:  Responsible; Isolates   Social Judgement:  Normal   Stress  Stressors:  Grief/losses; Transitions   Coping Ability:  Resilient   Skill Deficits:  Activities of daily living   Supports:  Family     Religion: Religion/Spirituality Are You A Religious Person?: No  Leisure/Recreation: Leisure / Recreation Do You Have Hobbies?: No  Exercise/Diet: Exercise/Diet Do You Exercise?: No Have You Gained or Lost A Significant Amount of Weight in the Past Six Months?: Yes-Gained Number of Pounds Gained: 20 (client reported she emotional eats and has gained weight within the past 6 months.) Do You Follow a Special Diet?: No Do You Have Any Trouble Sleeping?: Yes Explanation of Sleeping Difficulties: client reported she has sleep medication but its not helpful anymore  (seroquel ). client reported she sleeps for 4 hours then wakes up with racing thoughts. client reported always thinking of something negative upon waking daily. client reported she is not sure where it comes from.   CCA Employment/Education Employment/Work Situation: Employment / Work Situation Employment Situation: On disability Why is Patient on Disability: client reported she has chronic lymphodemia and a swollen disk in her back as well as thyroid  disease.  Education: Education Is Patient Currently Attending School?: No Did Garment/textile Technologist From [employer]?: Yes Did You Have An Individualized Education Program (IIEP): No Did You Have Any Difficulty At School?: No Patient's Education Has Been Impacted by Current Illness: No   CCA Family/Childhood History Family and Relationship History: Family history Marital status: Divorced Does patient have children?: Yes How many children?: 2 How is patient's relationship with their children?: client reported she has a son and daughter. client reported she tries to hide how severe her depressive and anxiety symptoms are from their. client reported they see alittle bit of it but not the full extent.  Childhood History:  Childhood History Additional childhood history information: client reported she is [place]. client reported she was raised by her mother. client reported her childhood was great. Patient's description of current relationship with people who raised him/her: client reported both parents are deceased. How were you disciplined when you got in trouble as a child/adolescent?: client reported her mother never beat or spanked her. client reported she was real happy. Does patient have siblings?: No Did patient suffer any verbal/emotional/physical/sexual abuse as a child?: No Did patient suffer from severe childhood neglect?: No Has patient ever been sexually abused/assaulted/raped as an adolescent or adult?: No Was the  patient ever a victim of a crime or a disaster?: No Witnessed domestic violence?: No Has patient been affected by domestic violence as an adult?: No  Child/Adolescent Assessment:     CCA Substance Use Alcohol/Drug Use: Alcohol / Drug Use History of alcohol / drug use?: No history of alcohol / drug abuse                         ASAM's:  Six Dimensions of Multidimensional Assessment  Dimension 1:  Acute Intoxication and/or Withdrawal Potential:      Dimension 2:  Biomedical Conditions and Complications:      Dimension 3:  Emotional, Behavioral, or Cognitive Conditions and Complications:     Dimension 4:  Readiness to Change:     Dimension 5:  Relapse, Continued use, or Continued Problem Potential:     Dimension 6:  Recovery/Living Environment:     ASAM Severity Score:    ASAM Recommended Level of Treatment:     Substance use Disorder (SUD)    Recommendations for Services/Supports/Treatments: Recommendations for Services/Supports/Treatments Recommendations For Services/Supports/Treatments: Individual Therapy, Medication Management  DSM5 Diagnoses: Patient Active Problem List   Diagnosis Date Noted   Major depressive disorder, recurrent episode, moderate with anxious distress (HCC) 02/13/2024   Healthcare maintenance 01/09/2021   Seasonal allergies 08/13/2020   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   Laryngopharyngeal reflux (LPR) 03/04/2020   History of tobacco use disorder 03/04/2020   Overactive bladder 01/01/2020   Menopause 03/21/2019   Chronic diarrhea 01/08/2019   Vaginal dryness, menopausal 10/29/2016   Restless legs 02/23/2016   Chronic pain 07/02/2015   Spondylosis of lumbar region without myelopathy or radiculopathy 12/13/2014   GERD (gastroesophageal reflux disease) 06/24/2011   Obesity (BMI 30-39.9) 01/22/2011   Anxiety and depression 10/24/2009   COPD (chronic obstructive pulmonary disease) (HCC) 05/09/2009   Hyperlipidemia 01/05/2008   Lymphedema  01/04/2007   HIDRADENITIS SUPPURATIVA 08/17/2006   Hypothyroidism 05/05/2006   Episodic mood disorder (HCC) 05/05/2006    Patient Centered Plan: Patient is on the following Treatment Plan(s):  Depression   Referrals to Alternative Service(s): Referred to Alternative Service(s):   Place:   Date:   Time:    Referred to Alternative Service(s):   Place:   Date:   Time:    Referred to Alternative Service(s):   Place:   Date:   Time:    Referred to Alternative Service(s):   Place:   Date:   Time:      Collaboration of Care: Medication Management [facility] and Referral or follow-up with counselor/therapist [facility]  Patient/Guardian was advised Release of Information must be obtained prior to any record release in order to collaborate their care with an outside provider. Patient/Guardian was advised if they have not already done so to contact the registration department to sign all necessary forms in order for us  to release information regarding their care.   Consent: Patient/Guardian gives  verbal consent for treatment and assignment of benefits for services provided during this visit. Patient/Guardian expressed understanding and agreed to proceed.   Ova Meegan Y Skylyn Slezak, LCSW

## 2024-02-17 ENCOUNTER — Other Ambulatory Visit: Payer: Self-pay | Admitting: Family Medicine

## 2024-02-17 DIAGNOSIS — M5020 Other cervical disc displacement, unspecified cervical region: Secondary | ICD-10-CM

## 2024-02-17 DIAGNOSIS — E890 Postprocedural hypothyroidism: Secondary | ICD-10-CM

## 2024-02-17 DIAGNOSIS — M792 Neuralgia and neuritis, unspecified: Secondary | ICD-10-CM

## 2024-02-17 DIAGNOSIS — Z716 Tobacco abuse counseling: Secondary | ICD-10-CM

## 2024-02-23 ENCOUNTER — Other Ambulatory Visit (HOSPITAL_COMMUNITY): Payer: Self-pay | Admitting: Physician Assistant

## 2024-02-23 ENCOUNTER — Encounter (HOSPITAL_COMMUNITY): Payer: Self-pay | Admitting: Physician Assistant

## 2024-02-23 ENCOUNTER — Ambulatory Visit (INDEPENDENT_AMBULATORY_CARE_PROVIDER_SITE_OTHER): Admitting: Physician Assistant

## 2024-02-23 VITALS — BP 130/96 | HR 71 | Temp 98.5°F | Ht 68.0 in | Wt 239.4 lb

## 2024-02-23 DIAGNOSIS — I890 Lymphedema, not elsewhere classified: Secondary | ICD-10-CM

## 2024-02-23 DIAGNOSIS — Z716 Tobacco abuse counseling: Secondary | ICD-10-CM

## 2024-02-23 DIAGNOSIS — F411 Generalized anxiety disorder: Secondary | ICD-10-CM | POA: Diagnosis not present

## 2024-02-23 DIAGNOSIS — F331 Major depressive disorder, recurrent, moderate: Secondary | ICD-10-CM

## 2024-02-23 MED ORDER — SERTRALINE HCL 50 MG PO TABS: ORAL_TABLET | ORAL | 0 refills | Status: AC

## 2024-02-23 MED ORDER — SERTRALINE HCL 25 MG PO TABS: ORAL_TABLET | ORAL | 0 refills | Status: DC

## 2024-02-23 MED ORDER — SERTRALINE HCL 100 MG PO TABS: 100.0000 mg | ORAL_TABLET | Freq: Every day | ORAL | 1 refills | Status: AC

## 2024-02-23 NOTE — Progress Notes (Signed)
 " Psychiatric Initial Adult Assessment   Patient Identification: [name] MRN:  [id number] Date of Evaluation:  02/23/2024 Referral Source: Not applicable Chief Complaint:   Chief Complaint  Patient presents with   Establish Care   Medication Management   Visit Diagnosis:    ICD-10-CM   1. Major depressive disorder, recurrent episode, moderate with anxious distress (HCC)  F33.1 sertraline  (ZOLOFT ) 100 MG tablet    sertraline  (ZOLOFT ) 50 MG tablet    DISCONTINUED: sertraline  (ZOLOFT ) 25 MG tablet    2. Encounter for smoking cessation counseling  Z71.6     3. Anxiety state  F41.1 sertraline  (ZOLOFT ) 100 MG tablet    sertraline  (ZOLOFT ) 50 MG tablet    DISCONTINUED: sertraline  (ZOLOFT ) 25 MG tablet    4. Lymphedema  I89.0 Ambulatory referral to Pain Clinic      History of Present Illness:    [name] is a 58 year old female with a past psychiatric history significant for major depressive disorder who presents to [facility] to establish psychiatric care and for medication management.  Patient was referred to this facility by her primary care provider for the management of her depression.  Prior to this encounter, patient reports that she was being treated for depression by her primary care provider for the past 35 years.  She reports that since going through menopause, her depression has worsened.  Patient reports that she is currently on bupropion  for the management of her depression, but was originally placed on the medication for smoking cessation.  Patient is also being managed on hydroxyzine  and Lyrica .  Patient is unsure of the other psychiatric medications that she has been on in the past, but states that she has been on several. Patient denies substance abuse but states that she occasionally binge drinks whenever there is a death in the family.  Though patient denies alcohol abuse, she reports that when she does drinks, it can  get out of hand.  She reports that she once tried her friend's Suboxone and states that it helped her with managing her alcohol use.  Patient also states that her depression is attributed to a bad break-up that she experienced last year but still weighs on her.  Patient also has a history of being impacted by several deaths within the family.  Patient has a past medical history significant for lymphedema, thyroid  removal, and menopause.  Patient reports that she typically feels good in the morning and endorses having high energy and motivation but those symptoms appear to disappear the further she gets into her day.  Patient rates her depression a 10 out of 10.  She reports that she struggles with loneliness and racing thoughts and will often cry every day.  She reports that she is unable to go outside of the home as often as she would like due to taking care of her sick aunt.  Patient endorses depressive episodes every day with symptoms lasting all day.  Patient denies suicidal ideations but does endorse the following depressive symptoms: feelings of sadness, lack of motivation, decreased concentration (due to menopause), decreased energy, irritability, and feelings of guilt/worthlessness.  Patient denies hopelessness.  Patient reports that she feels like she is often by herself.  She reports that her children have moved out of the city and states that she and her aunt live in separate rooms in her house.  Patient reports that she binge eats as a result of her depression.  Patient reports that she  has a history of using Xanax  and states that it was helpful in managing her anxiety, but it only lasted for a handful of minutes before wearing off.  Patient denies a past history of hospitalization due to mental health.  Patient further denies a past history of suicide attempt.  A PHQ-9 screen was performed with the patient scoring a 24.  A GAD-7 screen was also performed with the patient scoring an 18.  Patient  is alert and oriented x 4, calm, cooperative, and fully engaged in conversation during the encounter.  Patient endorses depressed mood.  Patient exhibits depressed mood with tearful affect.  Patient denies suicidal or homicidal ideations.  She further denies auditory or visual hallucinations and does not appear to be responding to internal/external stimuli.  Patient denies paranoia or delusional thoughts.  Patient endorses poor sleep and receives on average 5 hours of intermittent sleep per night.  Patient endorses fair appetite and eats on average 1 big meal per day.  Patient states that she will occasionally binge eat when really depressed.  Patient endorses alcohol consumption and states that she last drank 4 days ago.  Patient endorses tobacco use on occasion.  Patient denies illicit drug use.  Associated Signs/Symptoms: Depression Symptoms:  depressed mood, anhedonia, insomnia, psychomotor agitation, psychomotor retardation, fatigue, feelings of worthlessness/guilt, difficulty concentrating, hopelessness, impaired memory, recurrent thoughts of death, anxiety, panic attacks, loss of energy/fatigue, disturbed sleep, weight gain, decreased labido, increased appetite, (Hypo) Manic Symptoms:  Distractibility, Flight of Ideas, Licensed Conveyancer, Hallucinations, Impulsivity, Irritable Mood, Labiality of Mood, Anxiety Symptoms:  Excessive Worry, Panic Symptoms, Specific Phobias, Psychotic Symptoms:  Hallucinations: Visual Paranoia, PTSD Symptoms: Negative  Past Psychiatric History:  Patient endorses a past history of depression.  Patient also has a history of experiencing panic attacks.  Patient denies a past history of hospitalization due to mental health.  Patient denies a past history of suicide attempt.  Patient denies a past history of homicide attempt.  Previous Psychotropic Medications: Yes , patient reports that she has been on several psychiatric medications in  the past but does not remember the names.  She reports that she is currently on bupropion , hydroxyzine , and Seroquel .  She reports that she occasionally does not take her medications as prescribed.  Per chart review, patient has been on venlafaxine  XR in the past.  Substance Abuse History in the last 12 months:  No.  Consequences of Substance Abuse: Patient reports that she has a past history of drinking excessively whenever she has experienced a death in the family.  Medical Consequences:  Patient denies Legal Consequences:  Patient denies Family Consequences:  Patient denies Blackouts:  Patient denies DT's: Patient denies Withdrawal Symptoms:   None  Past Medical History:  Past Medical History:  Diagnosis Date   Allergy    Anxiety    Arthritis    knees,hands, hip  pt. Denies   Cellulitis    Colon polyps 2013   SESSIL SERRATED ADENOMA (ALL FRAGMENTS)   Complication of anesthesia    Lowered BP had to keep pt. overnight   COPD (chronic obstructive pulmonary disease) (HCC)    Depression    Fibroadenosis breast    GERD (gastroesophageal reflux disease)    does not take med   Heart murmur    as child  pt. denies   Hyperlipidemia    no meds - diet controlled   Hypothyroidism    Lymph edema    both legs and feet   Migraine  Plantar fasciitis    Syphilis Treated in early 2012 during hospitalization   Uterine fibroid    Varicose veins     Past Surgical History:  Procedure Laterality Date   COLONOSCOPY  2013   cyst removed     behind left knee and left cheek   ENDOMETRIAL ABLATION     ESOPHAGOGASTRODUODENOSCOPY  2013   normal   EYE SURGERY     bilarteral   HYDRADENITIS EXCISION Right 09/13/2017   Procedure: EXCISION HIDRADENITIS GROIN;  Surgeon: Signe Mitzie LABOR, MD;  Location: [facility];  Service: General;  Laterality: Right;  LMA   INCISION AND DRAINAGE ABSCESS / HEMATOMA OF BURSA / KNEE / THIGH     right upper thigh - 2008   NORPLANT REMOVAL     right upper arm -  12/2001   svd     x 2   THYROID  SURGERY  2004   removed   TUBAL LIGATION     2003   WISDOM TOOTH EXTRACTION      Family Psychiatric History:  Mother - chronic depression  Family history of suicide attempt: Patient reports that her cousin has attempted suicide in the past. Family history of homicide attempt: Patient reports that several cousins and her family have attempted homicide. Family history of substance abuse: Patient reports that alcohol abuse runs in her family.  Family History:  Family History  Problem Relation Age of Onset   Diabetes Father    Heart disease Father    Hyperlipidemia Father    Hypertension Father    Diabetes Mother    Hypertension Mother    Hyperlipidemia Mother    Diabetes Paternal Grandmother    Colon cancer Neg Hx    Esophageal cancer Neg Hx    Rectal cancer Neg Hx    Stomach cancer Neg Hx     Social History:   Social History   Socioeconomic History   Marital status: Divorced    Spouse name: Not on file   Number of children: 2   Years of education: Not on file   Highest education level: Not on file  Occupational History   Occupation: disabled  Tobacco Use   Smoking status: Former    Current packs/day: 0.00    Average packs/day: 0.5 packs/day for 26.5 years (13.7 ttl pk-yrs)    Types: Cigarettes    Start date: 06/06/1996    Quit date: 12/07/2022    Years since quitting: 1.2   Smokeless tobacco: Never   Tobacco comments:    Quit Spring 2023 with use of bupropion .  Quit Fall 2024 - quit X 3-4 months 04/2023 phone call.   Vaping Use   Vaping status: Never Used  Substance and Sexual Activity   Alcohol use: Not Currently    Comment: rare   Drug use: Not Currently    Types: Marijuana    Comment: past hx 72yrs ago   Sexual activity: Yes    Birth control/protection: Surgical  Other Topics Concern   Not on file  Social History Narrative   Lives with son in [place].    Social Drivers of Health   Tobacco Use: Medium Risk  (02/23/2024)   Patient History    Smoking Tobacco Use: Former    Smokeless Tobacco Use: Never    Passive Exposure: Not on Actuary Strain: Not on file  Food Insecurity: Not on file  Transportation Needs: Not on file  Physical Activity: Not on file  Stress: Not on file  Social Connections: Not on file  Depression (PHQ2-9): High Risk (02/23/2024)   Depression (PHQ2-9)    PHQ-2 Score: 24  Alcohol Screen: Not on file  Housing: Not on file  Utilities: Not on file  Health Literacy: Not on file    Additional Social History:  Patient endorses social support.  Patient has children of her own.  Patient endorses housing.  Patient is currently unemployed and receives disability.  Patient denies a past history of military experience.  Patient denies a past history of prison or jail time.  Patient has completed some college courses.  Patient denies access to weapons.  Allergies:  Allergies[1]  Metabolic Disorder Labs: Lab Results  Component Value Date   HGBA1C 5.3 08/05/2023   MPG 100 01/01/2014   MPG 105 02/10/2010   No results found for: PROLACTIN Lab Results  Component Value Date   CHOL 263 (H) 08/05/2023   TRIG 87 08/05/2023   HDL 81 08/05/2023   CHOLHDL 3.2 08/05/2023   VLDL 16 07/21/2015   LDLCALC 167 (H) 08/05/2023   LDLCALC 159 (H) 11/05/2022   Lab Results  Component Value Date   TSH 4.010 10/20/2023    Therapeutic Level Labs: No results found for: LITHIUM No results found for: CBMZ No results found for: VALPROATE  Current Medications: Current Outpatient Medications  Medication Sig Dispense Refill   sertraline  (ZOLOFT ) 100 MG tablet Take 1 tablet (100 mg total) by mouth daily. 30 tablet 1   buPROPion  (WELLBUTRIN  XL) 300 MG 24 hr tablet TAKE 1 TABLET(300 MG) BY MOUTH DAILY 30 tablet 2   clobetasol  cream (TEMOVATE ) 0.05 % Apply 1 Application topically 2 (two) times daily. 60 g 2   clobetasol  ointment (TEMOVATE ) 0.05 % APPLY TOPICALLY TO THE  AFFECTED AREA DAILY AS NEEDED FOR ECZEMA OR FLARE 60 g 2   cromolyn (OPTICROM) 4 % ophthalmic solution Place 1 drop into both eyes daily.     dexlansoprazole  (DEXILANT ) 60 MG capsule TAKE 1 CAPSULE(60 MG) BY MOUTH DAILY 90 capsule 0   Elastic Bandages & Supports (MEDICAL COMPRESSION STOCKINGS) MISC 2 Units by Does not apply route daily. 2 each 0   hydrOXYzine  (ATARAX ) 50 MG tablet Take 1 tablet (50 mg total) by mouth 2 (two) times daily as needed for anxiety. 90 tablet 1   levothyroxine  (SYNTHROID ) 175 MCG tablet Take 1 tablet (175 mcg total) by mouth daily before breakfast. 90 tablet 0   linaclotide  (LINZESS ) 145 MCG CAPS capsule Take 1 capsule (145 mcg total) by mouth daily before breakfast. 90 capsule 3   mirabegron  ER (MYRBETRIQ ) 25 MG TB24 tablet Take 25 mg by mouth.     mupirocin  ointment (BACTROBAN ) 2 % APPLY TOPICALLY TO THE AFFECTED AREA TWICE DAILY 22 g 0   ondansetron  (ZOFRAN -ODT) 4 MG disintegrating tablet DISSOLVE 1 TABLET(4 MG) ON THE TONGUE DAILY AS NEEDED FOR NAUSEA OR VOMITING 30 tablet 0   pravastatin  (PRAVACHOL ) 80 MG tablet Take 1 tablet (80 mg total) by mouth daily. 90 tablet 1   pregabalin  (LYRICA ) 225 MG capsule TAKE 1 CAPSULE(225 MG) BY MOUTH TWICE DAILY 60 capsule 2   QUEtiapine  (SEROQUEL ) 25 MG tablet TAKE 2 TABLETS(50 MG) BY MOUTH AT BEDTIME 90 tablet 1   sertraline  (ZOLOFT ) 50 MG tablet Patient to take sertraline  50 mg daily for 6 days then continue taking 100 mg daily. 6 tablet 0   SPIRIVA  HANDIHALER 18 MCG inhalation capsule PLACE 1 CAPSULE INTO THE INHALER AND INHALE DAILY AS NEEDED 30 capsule 2  venlafaxine  XR (EFFEXOR -XR) 37.5 MG 24 hr capsule Take 1 capsule (37.5 mg total) by mouth daily with breakfast. (Patient not taking: Reported on 10/26/2023) 30 capsule 3   VENTOLIN  HFA 108 (90 Base) MCG/ACT inhaler INHALE 2 PUFFS INTO THE LUNGS EVERY 4 HOURS AS NEEDED FOR SHORTNESS OF BREATH. APPOINMENT NEEDED FOR ADDITIONAL REFILLS (Patient not taking: Reported on 10/26/2023)  18 g 1   XIIDRA 5 % SOLN Apply 1 drop to eye 2 (two) times daily.     No current facility-administered medications for this visit.    Musculoskeletal: Strength & Muscle Tone: within normal limits Gait & Station: normal Patient leans: N/A  Psychiatric Specialty Exam: Review of Systems  Psychiatric/Behavioral:  Positive for dysphoric mood and sleep disturbance. Negative for decreased concentration, hallucinations, self-injury and suicidal ideas. The patient is nervous/anxious. The patient is not hyperactive.     Blood pressure (!) 130/96, pulse 71, temperature 98.5 F (36.9 C), temperature source Oral, height 5' 8 (1.727 m), weight 239 lb 6.4 oz (108.6 kg), SpO2 94%.Body mass index is 36.4 kg/m.  General Appearance: Well Groomed  Eye Contact:  Good  Speech:  Clear and Coherent and Normal Rate  Volume:  Normal  Mood:  Anxious and Depressed  Affect:  Congruent  Thought Process:  Coherent, Goal Directed, and Descriptions of Associations: Intact  Orientation:  Full (Time, Place, and Person)  Thought Content:  WDL  Suicidal Thoughts:  No  Homicidal Thoughts:  No  Memory:  Immediate;   Good Recent;   Good Remote;   Good  Judgement:  Good  Insight:  Good  Psychomotor Activity:  Normal  Concentration:  Concentration: Good and Attention Span: Good  Recall:  Good  Fund of Knowledge:Good  Language: Good  Akathisia:  No  Handed:  Right  AIMS (if indicated):  not done  Assets:  Communication Skills Desire for Improvement Financial Resources/Insurance Housing Social Support Transportation  ADL's:  Intact  Cognition: WNL  Sleep:  Fair   Screenings: GAD-7    Garment/textile Technologist Visit from 02/23/2024 in [facility] Counselor from 02/13/2024 in [facility]  Total GAD-7 Score 18 19   PHQ2-9    Flowsheet Row Office Visit from 02/23/2024 in [facility] Counselor from 02/13/2024 in [facility] Office Visit from 10/20/2023 in [facility] - [facility] Of [place]. [facility] Office Visit from 08/05/2023 in [facility] - [facility] Dept Of [place]. [facility] Office Visit from 11/04/2022 in [facility] - A Dept Of [place]. [facility]  PHQ-2 Total Score 6 6 5 5 5   PHQ-9 Total Score 24 21 20 20 17    Flowsheet Row Office Visit from 02/23/2024 in [facility] Counselor from 02/13/2024 in [facility] from 12/18/2022 in [facility] RISK CATEGORY No Risk No Risk No Risk    Assessment and Plan:   Rumor Sun is a 58 year old female with a past psychiatric history significant for major depressive disorder who presents to [facility] to establish psychiatric care and for medication management.  Patient was referred to this facility by her primary care provider who has been treating her for depression for the past 35 years.  Patient is currently on bupropion  (Wellbutrin  XL) 300 mg 24-hour tablet daily for the  management of her depression.  She reports that she was originally placed on bupropion  for tobacco cessation.  Patient is also taking hydroxyzine  and Lyrica .  She reports that she occasionally does not take bupropion  or hydroxyzine  due to the medications being ineffective in managing her depression.  Patient endorses depression attributed to several stressors in her life.  It appears that her depression is deeply impacted by the several deaths that have occurred in her family.  Patient also reports that her depression is deeply impacted by a break-up that she experienced last year that still weighs on her today.  She also reports that she is struggling with the following medical issues: Lymphedema, thyroid  removal, and menopause.  A PHQ-9 screen was performed with the  patient scoring a 24.  A GAD-7 screen was also performed with the patient scoring an 18.  Patient reports that she has been on several antidepressants in the past but does not remember which ones she has used.  Per [facility], patient has been on venlafaxine  in the past.  Provider inquired if patient had ever been on an SSRI.  Patient was unsure if she has ever been on an SSRI before.  Provider recommended patient be placed on sertraline  50 mg for 6 days, followed by 100 mg daily for the management of her depressive symptoms and anxiety.  Patient was agreeable to recommendation.  Patient was encouraged to continue taking bupropion  as prescribed.  Patient was informed of the synergistic potential between the use of bupropion  and sertraline .  Patient vocalized understanding.  Patient was encouraged to continue taking hydroxyzine  50 mg 3 times daily as needed for the management of her anxiety.  Patient vocalized understanding.  Patient's medications to be e-prescribed to pharmacy of choice.  Provider attempted to discuss potential side effects associated with the use of sertraline ; however, patient denied wanting to hear the side effects stating that she would be paranoid about whether or not she would experience the side effects.  Patient was referred to pain management due to her ongoing pain attributed to her lymphedema.  A [place] Suicide Severity Rating Scale was performed with the patient being considered no risk.  Patient denies suicidal ideations and is able to contract for safety at this time.    Collaboration of Care: Medication Management AEB provider managing patient's psychiatric medications, Primary Care Provider AEB patient being seen by family medicine, Psychiatrist AEB patient being followed by a mental health provider at this facility, Other provider involved in patient's care AEB patient being seen by physical medication, and Referral or follow-up with counselor/therapist AEB patient being seen  by a licensed clinical social worker at this facility  Patient/Guardian was advised Release of Information must be obtained prior to any record release in order to collaborate their care with an outside provider. Patient/Guardian was advised if they have not already done so to contact the registration department to sign all necessary forms in order for us  to release information regarding their care.   Consent: Patient/Guardian gives verbal consent for treatment and assignment of benefits for services provided during this visit. Patient/Guardian expressed understanding and agreed to proceed.   1. Encounter for smoking cessation counseling Patient to continue taking bupropion  (Wellbutrin  XL) 300 mg 24-hour tablet daily for the management of her smoking cessation  2. Major depressive disorder, recurrent episode, moderate with anxious distress (HCC) (Primary) Patient to continue taking Bupropion  (Wellbutrin  XL) 300 mg 24-hour tablet daily for the management of her smoking cessation  - sertraline  (ZOLOFT ) 100  MG tablet; Take 1 tablet (100 mg total) by mouth daily.  Dispense: 30 tablet; Refill: 1 - sertraline  (ZOLOFT ) 50 MG tablet; Patient to take sertraline  50 mg daily for 6 days then continue taking 100 mg daily.  Dispense: 6 tablet; Refill: 0  3. Anxiety state Patient to continue taking hydroxyzine  50 mg 3 times daily as needed for the management of her anxiety  - sertraline  (ZOLOFT ) 100 MG tablet; Take 1 tablet (100 mg total) by mouth daily.  Dispense: 30 tablet; Refill: 1 - sertraline  (ZOLOFT ) 50 MG tablet; Patient to take sertraline  50 mg daily for 6 days then continue taking 100 mg daily.  Dispense: 6 tablet; Refill: 0  4. Lymphedema  - Ambulatory referral to Pain Clinic  Patient to continue taking Seroquel  50 mg at bedtime for the management of her sleep  Patient to follow-up in 6 weeks Provider spent a total of 60+ minutes with the patient/reviewing patient's chart  Reginia FORBES Bolster,  PA 12/18/20257:26 PM      [1]  Allergies Allergen Reactions   Vicodin [Hydrocodone -Acetaminophen ] Itching   "

## 2024-03-07 ENCOUNTER — Other Ambulatory Visit: Payer: Self-pay | Admitting: Gastroenterology

## 2024-03-07 ENCOUNTER — Other Ambulatory Visit: Payer: Self-pay | Admitting: Family Medicine

## 2024-03-07 DIAGNOSIS — E890 Postprocedural hypothyroidism: Secondary | ICD-10-CM

## 2024-03-07 DIAGNOSIS — L739 Follicular disorder, unspecified: Secondary | ICD-10-CM

## 2024-03-07 DIAGNOSIS — K219 Gastro-esophageal reflux disease without esophagitis: Secondary | ICD-10-CM

## 2024-03-12 ENCOUNTER — Other Ambulatory Visit: Payer: Self-pay

## 2024-03-12 DIAGNOSIS — E890 Postprocedural hypothyroidism: Secondary | ICD-10-CM

## 2024-03-12 MED ORDER — LEVOTHYROXINE SODIUM 175 MCG PO TABS: 175.0000 ug | ORAL_TABLET | Freq: Every day | ORAL | 0 refills | Status: AC

## 2024-03-15 ENCOUNTER — Encounter: Payer: Self-pay | Admitting: Physical Medicine & Rehabilitation

## 2024-03-15 ENCOUNTER — Encounter: Attending: Physical Medicine & Rehabilitation | Admitting: Physical Medicine & Rehabilitation

## 2024-03-15 VITALS — BP 119/86 | HR 70 | Ht 68.0 in | Wt 240.0 lb

## 2024-03-15 DIAGNOSIS — M48061 Spinal stenosis, lumbar region without neurogenic claudication: Secondary | ICD-10-CM | POA: Insufficient documentation

## 2024-03-15 DIAGNOSIS — M47816 Spondylosis without myelopathy or radiculopathy, lumbar region: Secondary | ICD-10-CM | POA: Diagnosis not present
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
## 2024-03-15 NOTE — Patient Instructions (Addendum)
 " VISIT SUMMARY: During your visit, we discussed your chronic right shoulder and low back pain. We planned a corticosteroid injection for your shoulder and discussed a potential referral to physical therapy. For your low back pain, we talked about scheduling a lumbar spine injection with a specialist.  YOUR PLAN: RIGHT SHOULDER IMPINGEMENT SYNDROME WITH ADHESIVE CAPSULITIS: You have chronic right shoulder pain and limited movement due to impingement and adhesive capsulitis, worsened by immobilization. -We have scheduled a corticosteroid injection for your right shoulder to help with pain and inflammation. -We discussed the possibility of referring you to physical therapy after the injection to help with stiffness and improve your range of motion.  CHRONIC LOW BACK PAIN WITH LUMBAR SPONDYLOSIS AND SPINAL STENOSIS: You have severe low back pain with lumbar spondylosis and spinal stenosis, causing pain that radiates down your legs and significant functional limitations. -We discussed scheduling a lumbar spine injection under fluoroscopic guidance by a specialist. -Please note that the lumbar injections will require separate appointments from your shoulder injection.   Shoulder Range of Motion Exercises Shoulder range of motion (ROM) exercises are done to keep the shoulder moving freely or to increase movement. They are recommended for people who have shoulder pain or stiffness or who are recovering from a shoulder surgery. Ask your health care provider which exercises are safe for you. Do exercises exactly as told by your health care provider and adjust them as directed. It is normal to feel mild stretching, pulling, tightness, or discomfort as you do these exercises. Stop right away if you feel sudden pain or your pain gets worse. Do not begin these exercises until told by your health care provider. Phase 1 exercise When you are able, do this exercise 1-2 times a day for 30-60 seconds in each  direction, or as directed by your health care provider. Pendulum exercise     To do this exercise while sitting: Sit in a chair or at the edge of your bed with your feet flat on the floor. Let your affected arm hang down in front of you over the edge of the bed or chair. Relax your shoulder, arm, and hand. Rock your body so your arm gently swings in small circles. You can also use your unaffected arm to start the motion. Repeat, changing the direction of the circles, swinging your arm left and right, and swinging your arm forward and back. To do this exercise while standing: Stand next to a sturdy chair or table, and hold on to it with your hand on your unaffected side. Bend forward at the waist. Bend your knees slightly. Relax your shoulder, arm, and hand. While keeping your shoulder relaxed, use body motion to swing your arm in small circles. Repeat, changing the direction of the circles, swinging your arm left and right, and swinging your arm forward and back. Between exercises, stand up tall and take a short break to relax your lower back.  Phase 2 exercises Do these exercises 1-2 times a day or as told by your health care provider. Hold each stretch for 30 seconds, and repeat 3 times. Do the exercises with one or both arms as instructed by your health care provider. For these exercises, sit at a table with your hand and arm supported by the table. A chair that slides easily or has wheels can be helpful. External rotation  Turn your chair so that your affected side is nearest to the table. Place your forearm on the table to your side. [employer]  your arm to about a 90-degree angle (right angle) at the elbow, and place your hand palm-down on the table. Your elbow should be about 6 inches (15 cm) away from your side. Keeping your arm on the table, lean your body forward. Abduction  Turn your chair so that your affected side is nearest to the table. Place your forearm and hand on the table  so that your thumb points toward the ceiling and your arm is straight out to your side. Slide your hand out to the side and away from you. To increase the stretch, you can slide your chair away from the table. Flexion: forward stretch  Sit facing the table. Place your hand and elbow on the table in front of you. Slide your hand forward and away from you, using your unaffected arm to do the work. To increase the stretch, you can slide your chair backward. Phase 3 exercises Do these exercises 1-2 times a day or as told by your health care provider. Hold each stretch for 30 seconds, and repeat 3 times. Do the exercises with one or both arms as instructed by your health care provider. You will need a cane, a piece of PVC pipe, or a sturdy wooden dowel for the wand exercises. Cross-body stretch: posterior capsule stretch  Lift your arm straight out in front of you. Bend your arm in a 90-degree angle (right angle) at the elbow so your forearm moves across your body. Use your other arm to gently pull the elbow across your body, toward your other shoulder. Wall climbs  Stand with your affected arm extended out to the side with your hand resting on a door frame. Slide your hand slowly up the door frame. To increase the stretch, step through the door frame. Keep your body upright and do not lean. Flexion     To do this exercise while standing: Hold the wand with both of your hands, palms-down. Lift the wand up and over your head, if able. Lift mostly with your affected arm, and use the other arm to help. Push upward with your other arm to gently increase the stretch. To do this exercise while lying down: Lie on your back with your elbows resting on the floor and the wand in both your hands. Your hands will be palm-down, or pointing toward your feet. Lift your hands toward the ceiling, using your unaffected arm to help if needed. Bring your arms overhead as able, using your unaffected arm to  help if needed.  Internal rotation  Stand while holding the wand behind you with both hands. Your unaffected arm should be extended above your head with the arm of the affected side extended behind you at the level of your waist. The wand should be pointing straight up and down as you hold it. Slowly pull the wand up behind your back by straightening the elbow of your unaffected arm and bending the elbow of your affected arm. External rotation  Lie on your back with your affected upper arm supported on a small pillow or rolled towel. When you first do this exercise, keep your upper arm close to your body. Over time, bring your arm up to a 90-degree angle (right angle) out to the side. Hold the wand across your stomach and with both hands palm-up. Your elbow on your affected side should be bent at a 90-degree angle. Use your unaffected side to help push your forearm away from you and toward the floor. Keep your elbow on  your affected side bent at a 90-degree angle. This information is not intended to replace advice given to you by your health care provider. Make sure you discuss any questions you have with your health care provider. Document Revised: 04/14/2021 Document Reviewed: 04/14/2021 Elsevier Patient Education  2024 Elsevier Inc.                   Contains text generated by [employer].                                 Contains text generated by [employer].   "

## 2024-03-16 ENCOUNTER — Ambulatory Visit (HOSPITAL_COMMUNITY): Admitting: Clinical

## 2024-03-16 ENCOUNTER — Encounter (HOSPITAL_COMMUNITY): Payer: Self-pay

## 2024-03-20 ENCOUNTER — Telehealth: Payer: Self-pay

## 2024-03-20 DIAGNOSIS — Z7189 Other specified counseling: Secondary | ICD-10-CM

## 2024-03-20 NOTE — Progress Notes (Signed)
 Complex Care Management Note  Care Guide Note 03/20/2024 Name: ALEXI DORMINEY MRN: [id number] DOB: 1965/09/09  CORRISSA MARTELLO is a 59 y.o. year old female who sees Theophilus Pagan, MD for primary care. I reached out to [name] by phone today to offer complex care management services.  Ms. Selders was given information about Complex Care Management services today including:   The Complex Care Management services include support from the care team which includes your Nurse Care Manager, Clinical Social Worker, or Pharmacist.  The Complex Care Management team is here to help remove barriers to the health concerns and goals most important to you. Complex Care Management services are voluntary, and the patient may decline or stop services at any time by request to their care team member.   Complex Care Management Consent Status: Patient agreed to services and verbal consent obtained.   Follow up plan:  Telephone appointment with complex care management team member scheduled for:  04/06/2024  Encounter Outcome:  Patient Scheduled  Jeoffrey Buffalo , RMA     [place]  [facility], [facility] Guide  Direct Dial: [phone]  Website: [email or web address]

## 2024-04-05 ENCOUNTER — Encounter (HOSPITAL_COMMUNITY): Admitting: Physician Assistant

## 2024-04-06 ENCOUNTER — Other Ambulatory Visit: Payer: Self-pay | Admitting: *Deleted

## 2024-04-06 NOTE — Patient Instructions (Signed)
 [name] - I am sorry  you were unable to complete outreach today for our scheduled appointment. I work with Theophilus Pagan, MD and am calling to support your healthcare needs. Please contact me at [phone] at your earliest convenience. I look forward to speaking with you soon.   Thank you,  Kacia Halley, RN, BSN, ACM RN Care Manager [employer] [phone]

## 2024-04-11 ENCOUNTER — Other Ambulatory Visit: Payer: Self-pay | Admitting: *Deleted

## 2024-04-11 DIAGNOSIS — J449 Chronic obstructive pulmonary disease, unspecified: Secondary | ICD-10-CM

## 2024-04-11 MED ORDER — VENTOLIN HFA 108 (90 BASE) MCG/ACT IN AERS: INHALATION_SPRAY | RESPIRATORY_TRACT | 1 refills | Status: AC

## 2024-04-12 ENCOUNTER — Encounter (HOSPITAL_COMMUNITY): Admitting: Physician Assistant

## 2024-04-24 ENCOUNTER — Encounter: Admitting: Physical Medicine & Rehabilitation

## 2024-05-09 ENCOUNTER — Telehealth: Admitting: *Deleted
